# Patient Record
Sex: Male | Born: 1950 | Race: White | Hispanic: No | Marital: Married | Smoking: Former smoker
Health system: Southern US, Community
[De-identification: ages and names within clinical notes are randomized; demographics above are authoritative.]

## PROBLEM LIST (undated history)

## (undated) DIAGNOSIS — G4733 Obstructive sleep apnea (adult) (pediatric): Secondary | ICD-10-CM

## (undated) DIAGNOSIS — I2510 Atherosclerotic heart disease of native coronary artery without angina pectoris: Secondary | ICD-10-CM

## (undated) DIAGNOSIS — E8881 Metabolic syndrome: Secondary | ICD-10-CM

## (undated) DIAGNOSIS — I480 Paroxysmal atrial fibrillation: Secondary | ICD-10-CM

## (undated) DIAGNOSIS — I8390 Asymptomatic varicose veins of unspecified lower extremity: Secondary | ICD-10-CM

## (undated) DIAGNOSIS — K219 Gastro-esophageal reflux disease without esophagitis: Secondary | ICD-10-CM

## (undated) DIAGNOSIS — I255 Ischemic cardiomyopathy: Secondary | ICD-10-CM

## (undated) DIAGNOSIS — G4730 Sleep apnea, unspecified: Secondary | ICD-10-CM

## (undated) DIAGNOSIS — N400 Enlarged prostate without lower urinary tract symptoms: Secondary | ICD-10-CM

## (undated) DIAGNOSIS — H9190 Unspecified hearing loss, unspecified ear: Secondary | ICD-10-CM

## (undated) DIAGNOSIS — K449 Diaphragmatic hernia without obstruction or gangrene: Secondary | ICD-10-CM

## (undated) DIAGNOSIS — I5032 Chronic diastolic (congestive) heart failure: Secondary | ICD-10-CM

## (undated) DIAGNOSIS — K649 Unspecified hemorrhoids: Secondary | ICD-10-CM

## (undated) DIAGNOSIS — H9319 Tinnitus, unspecified ear: Secondary | ICD-10-CM

## (undated) HISTORY — DX: Unspecified hemorrhoids: K64.9

## (undated) HISTORY — DX: Diaphragmatic hernia without obstruction or gangrene: K44.9

## (undated) HISTORY — DX: Unspecified hearing loss, unspecified ear: H91.90

## (undated) HISTORY — DX: Tinnitus, unspecified ear: H93.19

## (undated) HISTORY — DX: Asymptomatic varicose veins of unspecified lower extremity: I83.90

## (undated) HISTORY — DX: Sleep apnea, unspecified: G47.30

## (undated) HISTORY — DX: Benign prostatic hyperplasia without lower urinary tract symptoms: N40.0

---

## 1999-10-11 ENCOUNTER — Ambulatory Visit: Admission: RE | Admit: 1999-10-11 | Discharge: 1999-10-11 | Payer: Self-pay | Admitting: Family Medicine

## 2004-07-21 HISTORY — PX: COLONOSCOPY: SHX174

## 2005-01-16 ENCOUNTER — Ambulatory Visit (HOSPITAL_COMMUNITY): Admission: RE | Admit: 2005-01-16 | Discharge: 2005-01-16 | Payer: Self-pay | Admitting: Gastroenterology

## 2005-01-16 LAB — HM COLONOSCOPY

## 2006-06-09 ENCOUNTER — Ambulatory Visit: Payer: Self-pay | Admitting: Family Medicine

## 2006-07-13 ENCOUNTER — Ambulatory Visit: Payer: Self-pay | Admitting: Family Medicine

## 2006-07-17 ENCOUNTER — Ambulatory Visit: Payer: Self-pay | Admitting: Family Medicine

## 2006-10-01 ENCOUNTER — Ambulatory Visit: Payer: Self-pay | Admitting: Family Medicine

## 2007-02-08 ENCOUNTER — Ambulatory Visit: Payer: Self-pay | Admitting: Family Medicine

## 2008-10-10 ENCOUNTER — Ambulatory Visit: Payer: Self-pay | Admitting: Family Medicine

## 2009-01-16 ENCOUNTER — Ambulatory Visit: Payer: Self-pay | Admitting: Family Medicine

## 2009-08-02 ENCOUNTER — Ambulatory Visit: Payer: Self-pay | Admitting: Family Medicine

## 2010-07-18 ENCOUNTER — Ambulatory Visit
Admission: RE | Admit: 2010-07-18 | Discharge: 2010-07-18 | Payer: Self-pay | Source: Home / Self Care | Attending: Family Medicine | Admitting: Family Medicine

## 2011-04-03 ENCOUNTER — Other Ambulatory Visit: Payer: Self-pay | Admitting: Family Medicine

## 2011-04-03 NOTE — Telephone Encounter (Signed)
RX CALLED IN.  PT INFORMED-LM

## 2012-02-16 ENCOUNTER — Encounter: Payer: Self-pay | Admitting: Internal Medicine

## 2012-02-20 ENCOUNTER — Encounter: Payer: Self-pay | Admitting: Family Medicine

## 2012-02-20 ENCOUNTER — Ambulatory Visit (INDEPENDENT_AMBULATORY_CARE_PROVIDER_SITE_OTHER): Payer: BC Managed Care – PPO | Admitting: Family Medicine

## 2012-02-20 VITALS — BP 130/88 | HR 76 | Ht 65.5 in | Wt 185.0 lb

## 2012-02-20 DIAGNOSIS — Z0000 Encounter for general adult medical examination without abnormal findings: Secondary | ICD-10-CM

## 2012-02-20 DIAGNOSIS — K219 Gastro-esophageal reflux disease without esophagitis: Secondary | ICD-10-CM

## 2012-02-20 DIAGNOSIS — Z87891 Personal history of nicotine dependence: Secondary | ICD-10-CM

## 2012-02-20 DIAGNOSIS — N529 Male erectile dysfunction, unspecified: Secondary | ICD-10-CM

## 2012-02-20 DIAGNOSIS — G4730 Sleep apnea, unspecified: Secondary | ICD-10-CM

## 2012-02-20 LAB — HEMOCCULT GUIAC POC 1CARD (OFFICE)

## 2012-02-20 LAB — CBC WITH DIFFERENTIAL/PLATELET
Basophils Absolute: 0 10*3/uL (ref 0.0–0.1)
Basophils Relative: 0 % (ref 0–1)
Eosinophils Absolute: 0.1 10*3/uL (ref 0.0–0.7)
Eosinophils Relative: 2 % (ref 0–5)
HCT: 45 % (ref 39.0–52.0)
Hemoglobin: 15 g/dL (ref 13.0–17.0)
Lymphocytes Relative: 32 % (ref 12–46)
Lymphs Abs: 1.6 10*3/uL (ref 0.7–4.0)
MCH: 30.5 pg (ref 26.0–34.0)
MCHC: 33.3 g/dL (ref 30.0–36.0)
MCV: 91.5 fL (ref 78.0–100.0)
Monocytes Absolute: 0.4 10*3/uL (ref 0.1–1.0)
Monocytes Relative: 8 % (ref 3–12)
Neutro Abs: 2.9 10*3/uL (ref 1.7–7.7)
Neutrophils Relative %: 58 % (ref 43–77)
Platelets: 237 10*3/uL (ref 150–400)
RBC: 4.92 MIL/uL (ref 4.22–5.81)
RDW: 14.2 % (ref 11.5–15.5)
WBC: 5 10*3/uL (ref 4.0–10.5)

## 2012-02-20 LAB — POCT URINALYSIS DIPSTICK
Bilirubin, UA: NEGATIVE
Blood, UA: NEGATIVE
Glucose, UA: NEGATIVE
Ketones, UA: NEGATIVE
Leukocytes, UA: NEGATIVE
Nitrite, UA: NEGATIVE
Protein, UA: NEGATIVE
Spec Grav, UA: 1.01
Urobilinogen, UA: NEGATIVE
pH, UA: 6

## 2012-02-20 LAB — COMPREHENSIVE METABOLIC PANEL
ALT: 20 U/L (ref 0–53)
AST: 17 U/L (ref 0–37)
Albumin: 4.2 g/dL (ref 3.5–5.2)
Alkaline Phosphatase: 59 U/L (ref 39–117)
BUN: 22 mg/dL (ref 6–23)
CO2: 26 mEq/L (ref 19–32)
Calcium: 9.7 mg/dL (ref 8.4–10.5)
Chloride: 102 mEq/L (ref 96–112)
Creat: 1.05 mg/dL (ref 0.50–1.35)
Glucose, Bld: 107 mg/dL — ABNORMAL HIGH (ref 70–99)
Potassium: 4.3 mEq/L (ref 3.5–5.3)
Sodium: 137 mEq/L (ref 135–145)
Total Bilirubin: 0.5 mg/dL (ref 0.3–1.2)
Total Protein: 6.7 g/dL (ref 6.0–8.3)

## 2012-02-20 LAB — LIPID PANEL
Cholesterol: 244 mg/dL — ABNORMAL HIGH (ref 0–200)
HDL: 39 mg/dL — ABNORMAL LOW (ref >39)
LDL Cholesterol: 171 mg/dL — ABNORMAL HIGH (ref 0–99)
Total CHOL/HDL Ratio: 6.3 Ratio
Triglycerides: 171 mg/dL — ABNORMAL HIGH (ref <150)
VLDL: 34 mg/dL (ref 0–40)

## 2012-02-20 NOTE — Patient Instructions (Signed)

## 2012-02-20 NOTE — Progress Notes (Signed)
  Subjective:    Patient ID: Henry Rogers, male    DOB: Nov 19, 1950, 61 y.o.   MRN: [id number]  HPI He is here for a complete examination. He does have reflux disease and uses medication on a daily basis. He has tried using it less often without success. He is still having difficulty with sleep and does occasionally take an OTC medication for this. He is also having difficulty with ED. He has tried Cialis in the past. 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   Review of Systems  Constitutional: Negative.   HENT: Negative.   Eyes: Negative.   Respiratory: Negative.   Cardiovascular: Negative.   Gastrointestinal: Negative.   Genitourinary: Negative.   Musculoskeletal: Negative.   Skin: Negative.   Neurological: Negative.   Hematological: Negative.   Psychiatric/Behavioral: Negative.        Objective:   Physical Exam BP 130/88  Pulse 76  Ht 5' 5.5" (1.664 m)  Wt 185 lb (83.915 kg)  BMI 30.32 kg/m2  General Appearance:    Alert, cooperative, no distress, appears stated age  Head:    Normocephalic, without obvious abnormality, atraumatic  Eyes:    PERRL, conjunctiva/corneas clear, EOM's intact, fundi    benign  Ears:    Normal TM's and external ear canals  Nose:   Nares normal, mucosa normal, no drainage or sinus   tenderness  Throat:   Lips, mucosa, and tongue normal; teeth and gums normal  Neck:   Supple, no lymphadenopathy;  thyroid:  no   enlargement/tenderness/nodules; no carotid   bruit or JVD  Back:    Spine nontender, no curvature, ROM normal, no CVA     tenderness  Lungs:     Clear to auscultation bilaterally without wheezes, rales or     ronchi; respirations unlabored  Chest Wall:    No tenderness or deformity   Heart:    Regular rate and rhythm, S1 and S2 normal, no murmur, rub   or gallop  Breast Exam:    No chest wall tenderness, masses or gynecomastia  Abdomen:     Soft, non-tender, nondistended,  normoactive bowel sounds,    no masses, no hepatosplenomegaly  Genitalia:    Normal male external genitalia without lesions.  Testicles without masses.  No inguinal hernias.  Rectal:    Normal sphincter tone, no masses or tenderness; guaiac negative stool.  Prostate smooth, no nodules, not enlarged.  Extremities:   No clubbing, cyanosis or edema  Pulses:   2+ and symmetric all extremities  Skin:   Skin color, texture, turgor normal, no rashes or lesions  Lymph nodes:   Cervical, supraclavicular, and axillary nodes normal  Neurologic:   CNII-XII intact, normal strength, sensation and gait; reflexes 2+ and symmetric throughout          Psych:   Normal mood, affect, hygiene and grooming.           Assessment & Plan:   1. GERD (gastroesophageal reflux disease)    2. Routine general medical examination at a health care facility  Hepatitis C antibody, PSA, CBC with Differential, Comprehensive metabolic panel, Lipid panel, PR ELECTROCARDIOGRAM, COMPLETE, CT Chest Wo Contrast, Hemoccult - 1 Card (office), POCT Urinalysis Dipstick  3. ED (erectile dysfunction)    4. Former smoker  CT Chest Wo Contrast  5 sleep disturbance Information on sleep hygiene given.

## 2012-02-21 LAB — PSA: PSA: 0.7 ng/mL (ref <=4.00)

## 2012-02-21 LAB — HEPATITIS C ANTIBODY: HCV Ab: NEGATIVE

## 2012-02-23 NOTE — Progress Notes (Signed)
Mailed pt copy of labs and diet info 

## 2012-10-04 ENCOUNTER — Encounter: Payer: Self-pay | Admitting: Family Medicine

## 2012-10-04 ENCOUNTER — Ambulatory Visit: Payer: BC Managed Care – PPO | Admitting: Family Medicine

## 2012-10-04 VITALS — BP 126/86 | HR 78 | Wt 190.0 lb

## 2012-10-04 DIAGNOSIS — G5600 Carpal tunnel syndrome, unspecified upper limb: Secondary | ICD-10-CM

## 2012-10-04 DIAGNOSIS — R200 Anesthesia of skin: Secondary | ICD-10-CM

## 2012-10-04 NOTE — Progress Notes (Signed)
  Subjective:    Patient ID: Henry Rogers, male    DOB: 1950-09-01, 62 y.o.   MRN: [id number]  HPI He is here for evaluation of a several month history of bilateral bilateral hand and finger tingling in fire-like sensation mainly in his thumb index and large fingers. It has gotten worse the last week and a half. He will sometimes wake up with this.he has noted no numbness tingling or weakness other than this. M is not giving him any trouble.   Review of Systems     Objective:   Physical Exam Alert and in no distress. Normal sensation and strength in his hands. Tinel's and Phalen's test was negative.       Assessment & Plan:  Numbness and tingling in hands discussed the etiology of this possibly being CTS. Recommended night splints. Explained that this did not sound like he was having any CNS type problems. If these nice plans to not work, I will order nerve conduction studies.

## 2012-10-29 ENCOUNTER — Ambulatory Visit (INDEPENDENT_AMBULATORY_CARE_PROVIDER_SITE_OTHER): Payer: BC Managed Care – PPO | Admitting: Family Medicine

## 2012-10-29 ENCOUNTER — Encounter: Payer: Self-pay | Admitting: Family Medicine

## 2012-10-29 VITALS — BP 128/88 | HR 72 | Wt 189.0 lb

## 2012-10-29 DIAGNOSIS — R201 Hypoesthesia of skin: Secondary | ICD-10-CM

## 2012-10-29 DIAGNOSIS — R209 Unspecified disturbances of skin sensation: Secondary | ICD-10-CM

## 2012-10-29 NOTE — Progress Notes (Signed)
  Subjective:    Patient ID: Henry Rogers, male    DOB: April 25, 1951, 62 y.o.   MRN: [id number]  HPI Since last being seen he has been using the wrist splints at night. He notes that the arm pain has diminished but he still is having a tingling sensation in his fingers. He has been pain around and notes that after 30 or 40 minutes he will get numbness in the hand he is using the paint   Review of Systems     Objective:   Physical Exam Alert and in no distress. Normal sensation and strength is noted. Tinel and Phalen's test negative.       Assessment & Plan:  Hypesthesia his symptoms are most consistent with CTS. I will order nerve conduction studies and followup pending results of this.

## 2012-11-02 ENCOUNTER — Ambulatory Visit: Payer: BC Managed Care – PPO | Admitting: Family Medicine

## 2012-11-09 ENCOUNTER — Ambulatory Visit (INDEPENDENT_AMBULATORY_CARE_PROVIDER_SITE_OTHER): Payer: BC Managed Care – PPO

## 2012-11-09 ENCOUNTER — Ambulatory Visit (INDEPENDENT_AMBULATORY_CARE_PROVIDER_SITE_OTHER): Payer: BC Managed Care – PPO | Admitting: Neurology

## 2012-11-09 DIAGNOSIS — Z0289 Encounter for other administrative examinations: Secondary | ICD-10-CM

## 2012-11-09 DIAGNOSIS — G5601 Carpal tunnel syndrome, right upper limb: Secondary | ICD-10-CM

## 2012-11-09 DIAGNOSIS — G5602 Carpal tunnel syndrome, left upper limb: Secondary | ICD-10-CM

## 2012-11-09 DIAGNOSIS — G5600 Carpal tunnel syndrome, unspecified upper limb: Secondary | ICD-10-CM

## 2012-11-09 NOTE — Procedures (Signed)
History: 62 years old right-handed Caucasian male, with bilateral fingers paresthesia   Nerve conduction Examination: Bilateral ulnar sensory and motor responses were normal. Bilateral median sensory response showed moderately prolonged peak latency, with normal snap amplitude.  Electromyography:  Selected needle examination was performed at right/left upper extremity muscles, and right/left cervical paraspinal muscles  Needle examination of right extensor digitorum communis, triceps, biceps, deltoid, was normal,  Right abductor pollicis brevis: Normal insertion activity, no spontaneous activity, mildly prolonged motor unit potential, with slightly decreased recruitment patterns  There was no spontaneous activity of the right cervical paraspinal muscles, right C5, 6, 7,  In conclusion: This is an abnormal study, there is no electrodiagnostic evidence of bilateral median neuropathy, consistent with moderate bilateral carpal tunnel syndromes.  There was no electrodiagnostic evidence of right cervical radiculopathy.

## 2012-11-11 NOTE — Progress Notes (Signed)
Quick Note:  Case was discussed with Dr. Terrace Arabia who will correct the dictation. Have him come in. 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

## 2012-11-12 NOTE — Progress Notes (Signed)
This the correction of previous EMG/NCS report dated April 22nd 2014:  History: 62 years old Caucasian male, with bilateral fingertips paresthesia  Nerve conduction studies: Bilateral ulnar sensory and motor responses were normal.   Bilateral median sensory response showed mild to moderately prolonged peak latency, right side was 5.0 msec, left side was 4.5 msec, (normal range <3.9 msec), with normal snap amplitude.  Bilateral median motor responses showed moderately prolonged distal latency, right side was 6.5 msec, left side was 5.7 msec, (normal was less than 4.35msec), there was also evidence of moderately prolonged distal latency, right side is worse than the left side.  Bilateral median motor responses showed normal CMAP amplitude, and nerve conduction velocity.  Electromyography:  Selected needle examination was performed at right upper extremity muscles, and right cervical paraspinal muscles  Needle examination of right extensor digitorum communis, triceps, biceps, deltoid, was normal,  Right abductor pollicis brevis: Normal insertion activity, No spontaneous activity, mildly enlarged motor unit potential, with mildly decreased recruitment patterns,  There was no spontaneous activity of the right cervical paraspinal muscles, right C5, 6, 7,  In conclusion:  This is an abnormal study, there is electrodiagnostic evidence of moderate bilateral median neuropathy across the wrist, consistent with moderate bilateral carpal tunnel syndromes, right side is mildly worse than the left side, there was no evidence of right abductor pollicis brevis atrophy, or active denervation. At current stage, conservative treatment would be recommended.  There is no electrodiagnostic evidence  of right cervical radiculopathy.

## 2012-11-15 ENCOUNTER — Institutional Professional Consult (permissible substitution): Payer: Self-pay | Admitting: Family Medicine

## 2012-11-15 ENCOUNTER — Ambulatory Visit (INDEPENDENT_AMBULATORY_CARE_PROVIDER_SITE_OTHER): Payer: BC Managed Care – PPO | Admitting: Family Medicine

## 2012-11-15 DIAGNOSIS — M25531 Pain in right wrist: Secondary | ICD-10-CM

## 2012-11-15 DIAGNOSIS — M25539 Pain in unspecified wrist: Secondary | ICD-10-CM

## 2012-11-15 DIAGNOSIS — M25532 Pain in left wrist: Secondary | ICD-10-CM

## 2012-11-15 MED ORDER — LIDOCAINE HCL 1 % IJ SOLN
1.0000 mL | Freq: Once | INTRAMUSCULAR | Status: AC
  Administered 2012-11-15: 1 mL via INTRADERMAL

## 2012-11-15 MED ORDER — TRIAMCINOLONE ACETONIDE 40 MG/ML IJ SUSP
20.0000 mg | Freq: Once | INTRAMUSCULAR | Status: AC
  Administered 2012-11-15: 20 mg via INTRAMUSCULAR

## 2012-11-15 NOTE — Progress Notes (Signed)
  Subjective:    Patient ID: CHE RACHAL, male    DOB: 09-08-1950, 62 y.o.   MRN: [id number]  HPI He is here for injection for carpal tunnel syndrome. Recent studies showed mild CTS. He has tried conservative treatment had not been successful.   Review of Systems     Objective:   Physical Exam Alert and in no distress otherwise not examined       Assessment & Plan:  Wrist pain, left - Plan: lidocaine (XYLOCAINE) 1 % (with pres) injection 1 mL, triamcinolone acetonide (KENALOG-40) injection 20 mg  Wrist pain, right - Plan: lidocaine (XYLOCAINE) 1 % (with pres) injection 1 mL, triamcinolone acetonide (KENALOG-40) injection 20 mg the palmaris longus tendon was identified. 20 mg of Kenalog and 1 cc of Xylocaine was injected to the radial side of the tendon bilaterally. He did get tingling and numbness in the median nerve distribution with injection into both carpal tunnel. He tolerated the procedure well. If continued difficulty, referral to orthopedics will be made.

## 2013-02-22 ENCOUNTER — Encounter: Payer: Self-pay | Admitting: Family Medicine

## 2013-02-22 ENCOUNTER — Ambulatory Visit (INDEPENDENT_AMBULATORY_CARE_PROVIDER_SITE_OTHER): Payer: BC Managed Care – PPO | Admitting: Family Medicine

## 2013-02-22 VITALS — BP 128/82 | HR 68 | Ht 65.0 in | Wt 188.0 lb

## 2013-02-22 DIAGNOSIS — M7710 Lateral epicondylitis, unspecified elbow: Secondary | ICD-10-CM

## 2013-02-22 DIAGNOSIS — N400 Enlarged prostate without lower urinary tract symptoms: Secondary | ICD-10-CM | POA: Insufficient documentation

## 2013-02-22 DIAGNOSIS — K219 Gastro-esophageal reflux disease without esophagitis: Secondary | ICD-10-CM

## 2013-02-22 DIAGNOSIS — L570 Actinic keratosis: Secondary | ICD-10-CM

## 2013-02-22 DIAGNOSIS — N529 Male erectile dysfunction, unspecified: Secondary | ICD-10-CM

## 2013-02-22 DIAGNOSIS — I8390 Asymptomatic varicose veins of unspecified lower extremity: Secondary | ICD-10-CM

## 2013-02-22 DIAGNOSIS — Z0000 Encounter for general adult medical examination without abnormal findings: Secondary | ICD-10-CM

## 2013-02-22 DIAGNOSIS — G5600 Carpal tunnel syndrome, unspecified upper limb: Secondary | ICD-10-CM

## 2013-02-22 DIAGNOSIS — M7711 Lateral epicondylitis, right elbow: Secondary | ICD-10-CM

## 2013-02-22 DIAGNOSIS — Z125 Encounter for screening for malignant neoplasm of prostate: Secondary | ICD-10-CM

## 2013-02-22 LAB — COMPREHENSIVE METABOLIC PANEL
ALT: 22 U/L (ref 0–53)
AST: 20 U/L (ref 0–37)
Albumin: 4.3 g/dL (ref 3.5–5.2)
Alkaline Phosphatase: 63 U/L (ref 39–117)
BUN: 19 mg/dL (ref 6–23)
CO2: 25 mEq/L (ref 19–32)
Calcium: 9.4 mg/dL (ref 8.4–10.5)
Chloride: 103 mEq/L (ref 96–112)
Creat: 1.05 mg/dL (ref 0.50–1.35)
Glucose, Bld: 105 mg/dL — ABNORMAL HIGH (ref 70–99)
Potassium: 4.2 mEq/L (ref 3.5–5.3)
Sodium: 137 mEq/L (ref 135–145)
Total Bilirubin: 0.4 mg/dL (ref 0.3–1.2)
Total Protein: 6.7 g/dL (ref 6.0–8.3)

## 2013-02-22 LAB — CBC WITH DIFFERENTIAL/PLATELET
Basophils Absolute: 0 10*3/uL (ref 0.0–0.1)
Basophils Relative: 0 % (ref 0–1)
Eosinophils Absolute: 0.1 10*3/uL (ref 0.0–0.7)
Eosinophils Relative: 3 % (ref 0–5)
HCT: 44.6 % (ref 39.0–52.0)
Hemoglobin: 15.3 g/dL (ref 13.0–17.0)
Lymphocytes Relative: 36 % (ref 12–46)
Lymphs Abs: 2 10*3/uL (ref 0.7–4.0)
MCH: 31 pg (ref 26.0–34.0)
MCHC: 34.3 g/dL (ref 30.0–36.0)
MCV: 90.5 fL (ref 78.0–100.0)
Monocytes Absolute: 0.4 10*3/uL (ref 0.1–1.0)
Monocytes Relative: 8 % (ref 3–12)
Neutro Abs: 2.9 10*3/uL (ref 1.7–7.7)
Neutrophils Relative %: 53 % (ref 43–77)
Platelets: 261 10*3/uL (ref 150–400)
RBC: 4.93 MIL/uL (ref 4.22–5.81)
RDW: 14.1 % (ref 11.5–15.5)
WBC: 5.5 10*3/uL (ref 4.0–10.5)

## 2013-02-22 LAB — POCT URINALYSIS DIPSTICK
Bilirubin, UA: NEGATIVE
Blood, UA: NEGATIVE
Glucose, UA: NEGATIVE
Ketones, UA: NEGATIVE
Leukocytes, UA: NEGATIVE
Nitrite, UA: NEGATIVE
Protein, UA: NEGATIVE
Spec Grav, UA: 1.01
Urobilinogen, UA: NEGATIVE
pH, UA: 5

## 2013-02-22 LAB — LIPID PANEL
Cholesterol: 258 mg/dL — ABNORMAL HIGH (ref 0–200)
HDL: 43 mg/dL (ref >39)
LDL Cholesterol: 183 mg/dL — ABNORMAL HIGH (ref 0–99)
Total CHOL/HDL Ratio: 6 Ratio
Triglycerides: 162 mg/dL — ABNORMAL HIGH (ref <150)
VLDL: 32 mg/dL (ref 0–40)

## 2013-02-22 NOTE — Patient Instructions (Signed)
Use Prilosec as needed for reflux

## 2013-02-22 NOTE — Progress Notes (Signed)
Subjective:    Patient ID: Henry Rogers, male    DOB: 17-Dec-1950, 62 y.o.   MRN: [id number]  HPI He is here for complete examination. He is now having difficulty with nocturia x2, urgency but no hesitancy or decreased stream. He is also noted difficulty with erectile dysfunction for well over a year. He has difficulty getting and maintaining erections with practically every sexual encounter. He notes varicose vein on the right calf that rarely causes discomfort. He also was noted some discomfort with use of the right arm at the elbow especially with his palms down and reaching backwards. He did have injections for CTS and states that he still does have some slight tingling sensation in his hands but not enough to cause any great difficulty. He has seen his dermatologist and was diagnosed with actinic keratoses. They plan to treat this more aggressively in the fall. He continues on Prilosec daily. He does note that certain foods do cause more difficulty.    Review of Systems Negative except as above     Objective:   Physical Exam BP 128/82  Pulse 68  Ht 5\' 5"  (1.651 m)  Wt 188 lb (85.276 kg)  BMI 31.28 kg/m2  General Appearance:    Alert, cooperative, no distress, appears stated age  Head:    Normocephalic, without obvious abnormality, atraumatic  Eyes:    PERRL, conjunctiva/corneas clear, EOM's intact, fundi    benign  Ears:    Normal TM's and external ear canals  Nose:   Nares normal, mucosa normal, no drainage or sinus   tenderness  Throat:   Lips, mucosa, and tongue normal; teeth and gums normal  Neck:   Supple, no lymphadenopathy;  thyroid:  no   enlargement/tenderness/nodules; no carotid   bruit or JVD  Back:    Spine nontender, no curvature, ROM normal, no CVA     tenderness  Lungs:     Clear to auscultation bilaterally without wheezes, rales or     ronchi; respirations unlabored  Chest Wall:    No tenderness or deformity   Heart:    Regular rate and rhythm, S1 and S2 normal,  no murmur, rub   or gallop  Breast Exam:    No chest wall tenderness, masses or gynecomastia  Abdomen:     Soft, non-tender, nondistended, normoactive bowel sounds,    no masses, no hepatosplenomegaly     Rectal:    Normal sphincter tone, no masses or tenderness; guaiac negative stool.  Prostate smooth, no nodules, enlarged.  Extremities:   No clubbing, cyanosis or edema; varicosities noted on both lower legs. Exam of the right elbow does show slight tenderness to palpation over the lateral epicondyle.   Pulses:   2+ and symmetric all extremities  Skin:   Skin color, texture, turgor normal, no rashes or lesions  Lymph nodes:   Cervical, supraclavicular, and axillary nodes normal  Neurologic:   CNII-XII intact, normal strength, sensation and gait; reflexes 2+ and symmetric throughout          Psych:   Normal mood, affect, hygiene and grooming.           Assessment & Plan:  Routine general medical examination at a health care facility - Plan: Urinalysis Dipstick, CBC with Differential, Comprehensive metabolic panel, Lipid panel, Hemoccult - 1 Card (office)  BPH (benign prostatic hyperplasia) - Plan: PSA  Varicose veins  Lateral epicondylitis (tennis elbow), right  CTS (carpal tunnel syndrome), unspecified laterality  Actinic keratosis  GERD (  gastroesophageal reflux disease)  Special screening for malignant neoplasm of prostate - Plan: PSA  ED (erectile dysfunction) - Plan: Testosterone I will place him on his Cialis to see if this will help with his BPH symptoms. He was given 5 mg for one month and will let me know how this works. Discussed treatment of his lateral epicondylitis with palms up and open. No particular therapy for the carpal tunnel. Discussed the treatment of actinic keratosis and what to expect. Recommend using the reflux medication on an as-needed basis. I will also check testosterone and the Cialis also help his erectile dysfunction.

## 2013-02-23 LAB — TESTOSTERONE: Testosterone: 836 ng/dL (ref 300–890)

## 2013-02-23 LAB — PSA: PSA: 0.8 ng/mL (ref <=4.00)

## 2013-02-23 NOTE — Progress Notes (Signed)
  Subjective:    Patient ID: Henry Rogers, male    DOB: 09/01/1950, 62 y.o.   MRN: [id number]  HPI    Review of Systems     Objective:   Physical Exam        Assessment & Plan:  I left him a message concerning his blood work. Encouraged him to make dietary changes to cut down on cholesterol and then recheck his lipid numbers in several months. Also encouraged him to start on his Cialis to see if it will help with his BPH/bladder symptoms.

## 2013-03-30 ENCOUNTER — Telehealth: Payer: Self-pay | Admitting: Family Medicine

## 2013-03-30 NOTE — Telephone Encounter (Signed)
Pt called and states the cialis daily is working good for the urinary and sleeping issue at night time.  It is not working for the ED issue.  Is there something else for the ED and also there is a prior authorization going through on the cialis.

## 2013-04-04 ENCOUNTER — Telehealth: Payer: Self-pay | Admitting: Family Medicine

## 2013-04-04 MED ORDER — TAMSULOSIN HCL 0.4 MG PO CAPS: 0.4000 mg | ORAL_CAPSULE | Freq: Every day | ORAL | Status: DC

## 2013-04-04 NOTE — Telephone Encounter (Signed)
His insurance would not cover Cialis for his BPH symptoms. I will place him on Flomax. This was discussed with him. He will let me know whether it works.

## 2013-05-26 ENCOUNTER — Encounter: Payer: Self-pay | Admitting: Internal Medicine

## 2013-05-26 ENCOUNTER — Other Ambulatory Visit: Payer: Self-pay

## 2013-06-06 ENCOUNTER — Encounter: Payer: Self-pay | Admitting: Family Medicine

## 2013-07-05 ENCOUNTER — Telehealth: Payer: Self-pay | Admitting: Family Medicine

## 2013-07-05 NOTE — Telephone Encounter (Signed)
PT INFORMED WORD FOR WORD AND VERBALIZED UNDERSTANDING  

## 2013-07-05 NOTE — Telephone Encounter (Signed)
Have him double the dose of the medication before we give up on it.

## 2013-07-21 DIAGNOSIS — I219 Acute myocardial infarction, unspecified: Secondary | ICD-10-CM

## 2013-07-21 HISTORY — DX: Acute myocardial infarction, unspecified: I21.9

## 2013-07-29 ENCOUNTER — Telehealth: Payer: Self-pay | Admitting: Internal Medicine

## 2013-07-29 ENCOUNTER — Telehealth: Payer: Self-pay | Admitting: Family Medicine

## 2013-07-29 MED ORDER — TAMSULOSIN HCL 0.4 MG PO CAPS: 0.8000 mg | ORAL_CAPSULE | Freq: Every day | ORAL | Status: DC

## 2013-07-29 NOTE — Telephone Encounter (Signed)
0.80 Flomax has worked for his symptoms. I will call in the prescribe

## 2013-07-29 NOTE — Telephone Encounter (Signed)
Pharmacy states that tamsulosin 0.4mg  is on backorder and they have no release date and want to know if you will change to a different med

## 2013-07-29 NOTE — Telephone Encounter (Signed)
It is on national backorder throughout all the stores and noone has it per pharmacy

## 2013-07-29 NOTE — Telephone Encounter (Signed)
Have them check with the other drug stores to see if they have any and get up from them

## 2013-08-25 ENCOUNTER — Other Ambulatory Visit: Payer: Self-pay | Admitting: Family Medicine

## 2013-08-25 MED ORDER — TAMSULOSIN HCL 0.4 MG PO CAPS: 0.8000 mg | ORAL_CAPSULE | Freq: Every day | ORAL | Status: DC

## 2014-02-02 ENCOUNTER — Encounter (HOSPITAL_COMMUNITY)
Admission: EM | Disposition: A | Discharge status: Home / Self Care | Payer: PRIVATE HEALTH INSURANCE | Attending: Cardiology

## 2014-02-02 ENCOUNTER — Inpatient Hospital Stay (HOSPITAL_COMMUNITY)
Admission: EM | Admit: 2014-02-02 | Discharge: 2014-02-05 | DRG: 248 | Disposition: A | Discharge status: Home / Self Care | Payer: Medicare Other | Source: Intra-hospital | Attending: Cardiology | Admitting: Cardiology

## 2014-02-02 ENCOUNTER — Encounter (HOSPITAL_COMMUNITY): Payer: Self-pay | Admitting: Cardiology

## 2014-02-02 DIAGNOSIS — I2584 Coronary atherosclerosis due to calcified coronary lesion: Secondary | ICD-10-CM

## 2014-02-02 DIAGNOSIS — R7309 Other abnormal glucose: Secondary | ICD-10-CM | POA: Diagnosis present

## 2014-02-02 DIAGNOSIS — N400 Enlarged prostate without lower urinary tract symptoms: Secondary | ICD-10-CM | POA: Diagnosis present

## 2014-02-02 DIAGNOSIS — H9190 Unspecified hearing loss, unspecified ear: Secondary | ICD-10-CM | POA: Diagnosis present

## 2014-02-02 DIAGNOSIS — I2510 Atherosclerotic heart disease of native coronary artery without angina pectoris: Secondary | ICD-10-CM

## 2014-02-02 DIAGNOSIS — R570 Cardiogenic shock: Secondary | ICD-10-CM

## 2014-02-02 DIAGNOSIS — Z7902 Long term (current) use of antithrombotics/antiplatelets: Secondary | ICD-10-CM | POA: Diagnosis not present

## 2014-02-02 DIAGNOSIS — K219 Gastro-esophageal reflux disease without esophagitis: Secondary | ICD-10-CM | POA: Diagnosis present

## 2014-02-02 DIAGNOSIS — G4733 Obstructive sleep apnea (adult) (pediatric): Secondary | ICD-10-CM | POA: Diagnosis present

## 2014-02-02 DIAGNOSIS — I2582 Chronic total occlusion of coronary artery: Secondary | ICD-10-CM | POA: Diagnosis present

## 2014-02-02 DIAGNOSIS — I2119 ST elevation (STEMI) myocardial infarction involving other coronary artery of inferior wall: Secondary | ICD-10-CM | POA: Diagnosis present

## 2014-02-02 DIAGNOSIS — Z87891 Personal history of nicotine dependence: Secondary | ICD-10-CM

## 2014-02-02 DIAGNOSIS — Z7982 Long term (current) use of aspirin: Secondary | ICD-10-CM

## 2014-02-02 DIAGNOSIS — I498 Other specified cardiac arrhythmias: Secondary | ICD-10-CM | POA: Diagnosis present

## 2014-02-02 DIAGNOSIS — E785 Hyperlipidemia, unspecified: Secondary | ICD-10-CM

## 2014-02-02 DIAGNOSIS — I4891 Unspecified atrial fibrillation: Secondary | ICD-10-CM | POA: Diagnosis present

## 2014-02-02 DIAGNOSIS — Z79899 Other long term (current) drug therapy: Secondary | ICD-10-CM

## 2014-02-02 DIAGNOSIS — R079 Chest pain, unspecified: Secondary | ICD-10-CM | POA: Diagnosis present

## 2014-02-02 DIAGNOSIS — I213 ST elevation (STEMI) myocardial infarction of unspecified site: Secondary | ICD-10-CM | POA: Insufficient documentation

## 2014-02-02 DIAGNOSIS — I2589 Other forms of chronic ischemic heart disease: Secondary | ICD-10-CM | POA: Diagnosis present

## 2014-02-02 DIAGNOSIS — H9113 Presbycusis, bilateral: Secondary | ICD-10-CM | POA: Diagnosis present

## 2014-02-02 DIAGNOSIS — I255 Ischemic cardiomyopathy: Secondary | ICD-10-CM | POA: Diagnosis present

## 2014-02-02 DIAGNOSIS — Z955 Presence of coronary angioplasty implant and graft: Secondary | ICD-10-CM

## 2014-02-02 DIAGNOSIS — I2111 ST elevation (STEMI) myocardial infarction involving right coronary artery: Secondary | ICD-10-CM

## 2014-02-02 DIAGNOSIS — N529 Male erectile dysfunction, unspecified: Secondary | ICD-10-CM | POA: Diagnosis present

## 2014-02-02 HISTORY — DX: Gastro-esophageal reflux disease without esophagitis: K21.9

## 2014-02-02 HISTORY — DX: Obstructive sleep apnea (adult) (pediatric): G47.33

## 2014-02-02 HISTORY — DX: Ischemic cardiomyopathy: I25.5

## 2014-02-02 HISTORY — PX: CORONARY ANGIOPLASTY WITH STENT PLACEMENT: SHX49

## 2014-02-02 HISTORY — DX: Atherosclerotic heart disease of native coronary artery without angina pectoris: I25.10

## 2014-02-02 HISTORY — PX: LEFT HEART CATHETERIZATION WITH CORONARY ANGIOGRAM: SHX5451

## 2014-02-02 LAB — COMPREHENSIVE METABOLIC PANEL
ALT: 21 U/L (ref 0–53)
AST: 21 U/L (ref 0–37)
Albumin: 3.9 g/dL (ref 3.5–5.2)
Alkaline Phosphatase: 59 U/L (ref 39–117)
Anion gap: 24 — ABNORMAL HIGH (ref 5–15)
BUN: 22 mg/dL (ref 6–23)
CO2: 16 mEq/L — ABNORMAL LOW (ref 19–32)
Calcium: 9.1 mg/dL (ref 8.4–10.5)
Chloride: 101 mEq/L (ref 96–112)
Creatinine, Ser: 1.65 mg/dL — ABNORMAL HIGH (ref 0.50–1.35)
GFR calc Af Amer: 49 mL/min — ABNORMAL LOW (ref >90)
GFR calc non Af Amer: 42 mL/min — ABNORMAL LOW (ref >90)
Glucose, Bld: 176 mg/dL — ABNORMAL HIGH (ref 70–99)
Potassium: 3.4 mEq/L — ABNORMAL LOW (ref 3.7–5.3)
Sodium: 141 mEq/L (ref 137–147)
Total Bilirubin: 0.6 mg/dL (ref 0.3–1.2)
Total Protein: 6.7 g/dL (ref 6.0–8.3)

## 2014-02-02 LAB — CBC
HCT: 40.3 % (ref 39.0–52.0)
HCT: 38.5 % — ABNORMAL LOW (ref 39.0–52.0)
Hemoglobin: 14.2 g/dL (ref 13.0–17.0)
Hemoglobin: 13 g/dL (ref 13.0–17.0)
MCH: 31.1 pg (ref 26.0–34.0)
MCH: 30.7 pg (ref 26.0–34.0)
MCHC: 35.2 g/dL (ref 30.0–36.0)
MCHC: 33.8 g/dL (ref 30.0–36.0)
MCV: 88.4 fL (ref 78.0–100.0)
MCV: 91 fL (ref 78.0–100.0)
Platelets: 227 10*3/uL (ref 150–400)
Platelets: 199 10*3/uL (ref 150–400)
RBC: 4.56 MIL/uL (ref 4.22–5.81)
RBC: 4.23 MIL/uL (ref 4.22–5.81)
RDW: 13.2 % (ref 11.5–15.5)
RDW: 13.5 % (ref 11.5–15.5)
WBC: 9.9 10*3/uL (ref 4.0–10.5)
WBC: 9.8 10*3/uL (ref 4.0–10.5)

## 2014-02-02 LAB — CREATININE, SERUM
Creatinine, Ser: 1.24 mg/dL (ref 0.50–1.35)
GFR calc Af Amer: 69 mL/min — ABNORMAL LOW (ref >90)
GFR calc non Af Amer: 59 mL/min — ABNORMAL LOW (ref >90)

## 2014-02-02 LAB — PROTIME-INR
INR: 1.03 (ref 0.00–1.49)
Prothrombin Time: 13.5 seconds (ref 11.6–15.2)

## 2014-02-02 LAB — POCT ACTIVATED CLOTTING TIME
Activated Clotting Time: 321 seconds
Activated Clotting Time: 140 seconds

## 2014-02-02 LAB — POCT I-STAT, CHEM 8
BUN: 19 mg/dL (ref 6–23)
Calcium, Ion: 1.11 mmol/L — ABNORMAL LOW (ref 1.13–1.30)
Chloride: 110 mEq/L (ref 96–112)
Creatinine, Ser: 1.7 mg/dL — ABNORMAL HIGH (ref 0.50–1.35)
Glucose, Bld: 180 mg/dL — ABNORMAL HIGH (ref 70–99)
HCT: 44 % (ref 39.0–52.0)
Hemoglobin: 15 g/dL (ref 13.0–17.0)
Potassium: 3.1 mEq/L — ABNORMAL LOW (ref 3.7–5.3)
Sodium: 139 mEq/L (ref 137–147)
TCO2: 21 mmol/L (ref 0–100)

## 2014-02-02 LAB — CK TOTAL AND CKMB (NOT AT ARMC)
CK, MB: 2.2 ng/mL (ref 0.3–4.0)
Relative Index: 1.4 (ref 0.0–2.5)
Total CK: 161 U/L (ref 7–232)

## 2014-02-02 LAB — HEMOGLOBIN A1C
Hgb A1c MFr Bld: 5.9 % — ABNORMAL HIGH (ref <5.7)
Mean Plasma Glucose: 123 mg/dL — ABNORMAL HIGH (ref <117)

## 2014-02-02 LAB — LIPID PANEL
Cholesterol: 229 mg/dL — ABNORMAL HIGH (ref 0–200)
HDL: 52 mg/dL (ref >39)
LDL Cholesterol: 153 mg/dL — ABNORMAL HIGH (ref 0–99)
Total CHOL/HDL Ratio: 4.4 RATIO
Triglycerides: 122 mg/dL (ref <150)
VLDL: 24 mg/dL (ref 0–40)

## 2014-02-02 LAB — TROPONIN I
Troponin I: <0.3 ng/mL (ref <0.30)
Troponin I: 15.9 ng/mL (ref <0.30)

## 2014-02-02 LAB — MRSA PCR SCREENING: MRSA by PCR: NEGATIVE

## 2014-02-02 LAB — APTT: aPTT: 30 seconds (ref 24–37)

## 2014-02-02 LAB — TSH: TSH: 0.973 u[IU]/mL (ref 0.350–4.500)

## 2014-02-02 SURGERY — LEFT HEART CATHETERIZATION WITH CORONARY ANGIOGRAM
Anesthesia: LOCAL

## 2014-02-02 MED ORDER — BIVALIRUDIN 250 MG IV SOLR
INTRAVENOUS | Status: AC
Start: 2014-02-02 — End: 2014-02-02
  Filled 2014-02-02: qty 250

## 2014-02-02 MED ORDER — TIROFIBAN HCL IV 5 MG/100ML
INTRAVENOUS | Status: AC
  Filled 2014-02-02: qty 100

## 2014-02-02 MED ORDER — PANTOPRAZOLE SODIUM 40 MG PO TBEC
40.0000 mg | DELAYED_RELEASE_TABLET | Freq: Every day | ORAL | Status: DC
  Administered 2014-02-03 – 2014-02-05 (×3): 40 mg via ORAL
  Filled 2014-02-02 (×3): qty 1

## 2014-02-02 MED ORDER — MIDAZOLAM HCL 2 MG/2ML IJ SOLN
INTRAMUSCULAR | Status: AC
  Filled 2014-02-02: qty 2

## 2014-02-02 MED ORDER — DEXTROSE 5 % IV SOLN: 2.0000 ug/min | INTRAVENOUS | Status: DC

## 2014-02-02 MED ORDER — DIAZEPAM 5 MG PO TABS: 5.0000 mg | ORAL_TABLET | ORAL | Status: DC | PRN

## 2014-02-02 MED ORDER — TICAGRELOR 90 MG PO TABS
ORAL_TABLET | ORAL | Status: AC
  Filled 2014-02-02: qty 2

## 2014-02-02 MED ORDER — ACETAMINOPHEN 325 MG PO TABS: 650.0000 mg | ORAL_TABLET | ORAL | Status: DC | PRN

## 2014-02-02 MED ORDER — NOREPINEPHRINE BITARTRATE 1 MG/ML IV SOLN
INTRAVENOUS | Status: AC
  Filled 2014-02-02: qty 4

## 2014-02-02 MED ORDER — SODIUM CHLORIDE 0.9 % IV SOLN: INTRAVENOUS | Status: DC

## 2014-02-02 MED ORDER — ONDANSETRON HCL 4 MG/2ML IJ SOLN: 4.0000 mg | Freq: Four times a day (QID) | INTRAMUSCULAR | Status: DC | PRN

## 2014-02-02 MED ORDER — MORPHINE SULFATE 2 MG/ML IJ SOLN: 2.0000 mg | INTRAMUSCULAR | Status: DC | PRN

## 2014-02-02 MED ORDER — ASPIRIN 81 MG PO CHEW: 81.0000 mg | CHEWABLE_TABLET | Freq: Every day | ORAL | Status: DC

## 2014-02-02 MED ORDER — TIROFIBAN HCL IV 5 MG/100ML
0.1500 ug/kg/min | INTRAVENOUS | Status: AC
  Administered 2014-02-02 – 2014-02-03 (×2): 0.15 ug/kg/min via INTRAVENOUS
  Filled 2014-02-02 (×3): qty 100

## 2014-02-02 MED ORDER — LIDOCAINE HCL (PF) 1 % IJ SOLN
INTRAMUSCULAR | Status: AC
  Filled 2014-02-02: qty 30

## 2014-02-02 MED ORDER — TIROFIBAN HCL IV 5 MG/100ML
0.1500 ug/kg/min | INTRAVENOUS | Status: DC
  Filled 2014-02-02 (×4): qty 100

## 2014-02-02 MED ORDER — ATROPINE SULFATE 0.1 MG/ML IJ SOLN
INTRAMUSCULAR | Status: AC
  Filled 2014-02-02: qty 10

## 2014-02-02 MED ORDER — HEPARIN (PORCINE) IN NACL 2-0.9 UNIT/ML-% IJ SOLN
INTRAMUSCULAR | Status: AC
  Filled 2014-02-02: qty 1000

## 2014-02-02 MED ORDER — SODIUM CHLORIDE 0.9 % IV SOLN
1.7500 mg/kg/h | INTRAVENOUS | Status: DC
  Filled 2014-02-02: qty 250

## 2014-02-02 MED ORDER — NITROGLYCERIN 0.2 MG/ML ON CALL CATH LAB
INTRAVENOUS | Status: AC
  Filled 2014-02-02: qty 1

## 2014-02-02 MED ORDER — FENTANYL CITRATE 0.05 MG/ML IJ SOLN
INTRAMUSCULAR | Status: AC
  Filled 2014-02-02: qty 2

## 2014-02-02 MED ORDER — ATORVASTATIN CALCIUM 80 MG PO TABS
80.0000 mg | ORAL_TABLET | Freq: Every day | ORAL | Status: DC
  Administered 2014-02-02 – 2014-02-04 (×3): 80 mg via ORAL
  Filled 2014-02-02 (×4): qty 1

## 2014-02-02 MED ORDER — SODIUM CHLORIDE 0.9 % IV SOLN
INTRAVENOUS | Status: AC
  Administered 2014-02-02 (×2): via INTRAVENOUS

## 2014-02-02 MED ORDER — HEPARIN SODIUM (PORCINE) 5000 UNIT/ML IJ SOLN
5000.0000 [IU] | Freq: Three times a day (TID) | INTRAMUSCULAR | Status: DC
  Administered 2014-02-03 – 2014-02-05 (×7): 5000 [IU] via SUBCUTANEOUS
  Filled 2014-02-02 (×10): qty 1

## 2014-02-02 MED ORDER — ACETAMINOPHEN 325 MG PO TABS
650.0000 mg | ORAL_TABLET | ORAL | Status: DC | PRN
  Administered 2014-02-03: 650 mg via ORAL
  Filled 2014-02-02: qty 2

## 2014-02-02 MED ORDER — NITROGLYCERIN 0.4 MG SL SUBL: 0.4000 mg | SUBLINGUAL_TABLET | SUBLINGUAL | Status: DC | PRN

## 2014-02-02 MED ORDER — TICAGRELOR 90 MG PO TABS
90.0000 mg | ORAL_TABLET | Freq: Two times a day (BID) | ORAL | Status: DC
  Administered 2014-02-02 – 2014-02-05 (×6): 90 mg via ORAL
  Filled 2014-02-02 (×7): qty 1

## 2014-02-02 MED ORDER — ASPIRIN EC 81 MG PO TBEC
81.0000 mg | DELAYED_RELEASE_TABLET | Freq: Every day | ORAL | Status: DC
  Administered 2014-02-03 – 2014-02-05 (×3): 81 mg via ORAL
  Filled 2014-02-02 (×3): qty 1

## 2014-02-02 NOTE — Progress Notes (Signed)
02/02/2014 1825  Rt femoral sheath removed and pressure held x 20 minutes.  Tol well. No complications.  Instructions given to pt and wife.  Bedrest now til 0025. Nicholos Aloisi, Linnell Fulling

## 2014-02-02 NOTE — CV Procedure (Signed)
Cardiac Catheterization Procedure Note  Name: [name] MRN: [id number] DOB: [birth year]  Procedure: Left Heart Cath, Selective Coronary Angiography, PTCA/Stent of the mid RCA.  Indication: 63 yo WM presents with an acute inferior STEMI associated with cardiogenic shock.   Diagnostic Procedure Details: The right groin was prepped, draped, and anesthetized with 1% lidocaine. Using the modified Seldinger technique, a 6 French sheath was introduced into the right femoral artery. Standard Judkins catheters were used for selective coronary angiography. Catheter exchanges were performed over a wire.  The diagnostic procedure was well-tolerated without immediate complications.  PROCEDURAL FINDINGS Hemodynamics: AO 60/30 mm Hg at the beginning of the case. 91/67 at the end of the case. Patient was initially bradycardic then went into atrial fibrillation during the procedure. LV 86/10 mm Hg by the end of the case.  Coronary angiography: Coronary dominance: right  Left mainstem: Normal.  Left anterior descending (LAD): Mild disease in the proximal vessel up to 20%.  Left circumflex (LCx): Mild ectasia in the mid vessel without obstructive disease.  Right coronary artery (RCA): The RCA is a very large and ectatic vessel. There is acute angulation in the proximal vessel and the vessel is tortuous. The RCA is occluded 100% in the mid vessel. The proximal to mid vessel has diffuse nonobstructive disease.  Left ventriculography: Not done  PCI Procedure Note:  Following the diagnostic procedure, the decision was made to proceed with PCI of the RCA.  Weight-based bivalirudin was given for anticoagulation. Brilinta 180 mg was given orally. Once a therapeutic ACT was achieved, a 6 [place]French FR4 guide catheter was inserted. IV Levophed was used for BP support. The patient received 1 liter of IV saline per bolus. A prowater coronary guidewire was used to cross the lesion.  The lesion was predilated  with a 2.5 mm balloon. With reperfusion there was extensive thrombus noted throughout the distal RCA with distal embolization into the PLOM branch. We attempted to perform thrombus extraction with a Pronto catheter but were unable to cross the proximal vessel with this device. We performed additional balloon inflations with a 2.5 mm long balloon distally and into the PLOM with restoration of flow into all but the terminal PLOM branch. Due to the extensive thrombus burden we initiated IV Aggrastat.  After our initial efforts we were able to restore TIMI 3 flow and the patient's hemodynamics improved. His chest pain also improved. We attempted at this point to stent the culprit lesion in the mid vessel. The vessel was very large at this point and we attempted to pass a 5.5 mm Herculink stent but even with a buddy wire the stent could not be passed across the proximal vessel bend. The lesion was ballooned with a 3.5 then a 4.0 mm balloon but there was a suboptimal result with poor flow through this segment. It was clear at this point that we would need better support to place a stent. We removed the wire and 6 Fr equipment. The femoral sheath was exchanged for a 7 Fr sheath. Our guide was changed to a 7 Fr LA1 guide. A PT2 moderate support wire was used to recross the lesion. We were able at this point to cross the lesion with a 5.0 x 16 mm Veriflex stent.  The stent was postdilated with a 6.0 mm noncompliant balloon.  Following PCI, there was 0% residual stenosis and TIMI-3 flow. There was significant streaming of flow distally due to the large vessel size. There was some reperfusion of  the terminal PLOM branch as well.Final angiography confirmed an excellent result. Femoral hemostasis was achieved with Manual compression.  The patient tolerated the PCI procedure well. There were no immediate procedural complications.  The patient was transferred to the post catheterization recovery area for further monitoring. We  were able to reduce IV pressor support. He remained in Afib with a controlled response. Chest pain was resolved and ST elevation was improved.   PCI Data: Vessel - RCA/Segment - mid Percent Stenosis (pre)  100% TIMI-flow 0 Stent 5.0 x 16 mm Veriflex post dilated to 6.0 mm Percent Stenosis (post) 0% TIMI-flow (post) 3  Final Conclusions:   1. 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 Cardiogenic shock due to #1. Resolved. 3. Successful stenting of the mid RCA with a BMS. Procedure was very complex due to extensive thrombosis, vessel tortuosity, and large vessel diameter.   Recommendations: Continue DAPT for at least one year. IV Aggrastat for 18 hours. Wean IV pressors as tolerated. Statin therapy. Hold beta blocker therapy until BP recovers. Will check Echo to assess LV function.  Henry Rogers [place], MDFACC  02/02/2014, 3:44 PM

## 2014-02-02 NOTE — H&P (Signed)
Henry Rogers is an 63 y.o. male.   Chief Complaint:   HPI:  The patient is a 63 yo male with a history of tobacco abuse(Quit three years ago), sleep apnea.  He was working digging out a stump since 0900 hrs this morning.  He reports becoming very tired, SOB, sweaty and felt like he was going to pass out.  He did have some CP but only rated it at a 3/10 and it did not start until after EMS arrived.  He was also having two days of diarrhea.  He went inside to rehydrate and family decided to call EMS.  No nausea, vomiting, fever, orthopnea, LEE.  No family history of CAD.    Past Medical History  Diagnosis Date  . Hemorrhoids   . Tinnitus   . Hearing loss   . HH (hiatus hernia)   . Sleep apnea     Past Surgical History  Procedure Laterality Date  . Colonoscopy  2006    Dr. Elnoria Rogers    No family history on file. Social History:  reports that he has quit smoking. He does not have any smokeless tobacco history on file. He reports that he drinks about 1.8 ounces of alcohol per week. He reports that he does not use illicit drugs.  Allergies: No Known Allergies  Medications Prior to Admission  Medication Sig Dispense Refill  . aspirin 81 MG tablet Take 81 mg by mouth daily.      . Melatonin 10 MG CAPS Take by mouth.      . Multiple Vitamin (MULTIVITAMIN) tablet Take 1 tablet by mouth daily.      [name] omeprazole (PRILOSEC) 10 MG capsule Take 10 mg by mouth daily.      . tamsulosin (FLOMAX) 0.4 MG CAPS capsule Take 2 capsules (0.8 mg total) by mouth daily.  180 capsule  3    No results found for this or any previous visit (from the past 48 hour(s)). No results found.  Review of Systems  Constitutional: Positive for malaise/fatigue and diaphoresis. Negative for fever.  HENT: Negative for congestion.   Respiratory: Positive for shortness of breath. Negative for cough.   Cardiovascular: Positive for chest pain. Negative for orthopnea, leg swelling and PND.  Gastrointestinal:  Negative for nausea and vomiting.  Musculoskeletal: Negative for myalgias.  Neurological: Positive for dizziness.    There were no vitals taken for this visit. Physical Exam  Nursing note and vitals reviewed. Constitutional: He is oriented to person, place, and time. He appears well-developed and well-nourished. No distress.  HENT:  Head: Normocephalic and atraumatic.  Eyes: EOM are normal. Pupils are equal, round, and reactive to light. No scleral icterus.  Neck: Normal range of motion.  Cardiovascular: Normal rate, regular rhythm, S1 normal and S2 normal.   No murmur heard. Pulses:      Radial pulses are 2+ on the right side, and 2+ on the left side.       Dorsalis pedis pulses are 1+ on the right side, and 1+ on the left side.  Respiratory: Breath sounds normal. He has no wheezes. He has no rales.  GI: Soft. Bowel sounds are normal. He exhibits no distension.  Musculoskeletal: He exhibits no edema.  Neurological: He is alert and oriented to person, place, and time. He exhibits normal muscle tone.  Skin: Skin is warm.  Diaphoretic  Psychiatric: He has a normal mood and affect.     Assessment/Plan Principal Problem:   STEMI (ST  elevation myocardial infarction) Active Problems:   Sleep apnea  Plan:  The patient was taken directly to the cath lab.   Check lipids, TSH, A1C.  No beta blocker at the moment due to hypotension.  Statin.  Echo.    Henry Rogers, Henry Rogers, Henry Rogers 02/02/2014, 12:51 PM   Patient seen and examined and history reviewed. Agree with above findings and plan. 63 yo WM presents with acute inferior STEMI associated with cardiogenic shock. He is diaphoretic. He is alert and awake. He is hypotensive and bradycardic. Emergent cardiac cath and PCI indicated.   Henry Rogers, MDFACC 02/02/2014 3:13 PM

## 2014-02-03 DIAGNOSIS — I059 Rheumatic mitral valve disease, unspecified: Secondary | ICD-10-CM

## 2014-02-03 LAB — BASIC METABOLIC PANEL
Anion gap: 15 (ref 5–15)
BUN: 18 mg/dL (ref 6–23)
CO2: 22 mEq/L (ref 19–32)
Calcium: 8.2 mg/dL — ABNORMAL LOW (ref 8.4–10.5)
Chloride: 101 mEq/L (ref 96–112)
Creatinine, Ser: 0.97 mg/dL (ref 0.50–1.35)
GFR calc Af Amer: >90 mL/min (ref >90)
GFR calc non Af Amer: 85 mL/min — ABNORMAL LOW (ref >90)
Glucose, Bld: 109 mg/dL — ABNORMAL HIGH (ref 70–99)
Potassium: 3.8 mEq/L (ref 3.7–5.3)
Sodium: 138 mEq/L (ref 137–147)

## 2014-02-03 LAB — CBC
HCT: 39.7 % (ref 39.0–52.0)
HCT: 35.9 % — ABNORMAL LOW (ref 39.0–52.0)
Hemoglobin: 13.3 g/dL (ref 13.0–17.0)
Hemoglobin: 12.1 g/dL — ABNORMAL LOW (ref 13.0–17.0)
MCH: 31.4 pg (ref 26.0–34.0)
MCH: 30.8 pg (ref 26.0–34.0)
MCHC: 33.5 g/dL (ref 30.0–36.0)
MCHC: 33.7 g/dL (ref 30.0–36.0)
MCV: 93.6 fL (ref 78.0–100.0)
MCV: 91.3 fL (ref 78.0–100.0)
Platelets: 209 10*3/uL (ref 150–400)
Platelets: 176 10*3/uL (ref 150–400)
RBC: 4.24 MIL/uL (ref 4.22–5.81)
RBC: 3.93 MIL/uL — ABNORMAL LOW (ref 4.22–5.81)
RDW: 14.1 % (ref 11.5–15.5)
RDW: 14 % (ref 11.5–15.5)
WBC: 8.7 10*3/uL (ref 4.0–10.5)
WBC: 8.3 10*3/uL (ref 4.0–10.5)

## 2014-02-03 LAB — TROPONIN I
Troponin I: >20 ng/mL (ref <0.30)
Troponin I: >20 ng/mL (ref <0.30)

## 2014-02-03 LAB — LIPID PANEL
Cholesterol: 190 mg/dL (ref 0–200)
HDL: 41 mg/dL (ref >39)
LDL Cholesterol: 127 mg/dL — ABNORMAL HIGH (ref 0–99)
Total CHOL/HDL Ratio: 4.6 RATIO
Triglycerides: 109 mg/dL (ref <150)
VLDL: 22 mg/dL (ref 0–40)

## 2014-02-03 LAB — HEMOGLOBIN A1C
Hgb A1c MFr Bld: 5.8 % — ABNORMAL HIGH (ref <5.7)
Mean Plasma Glucose: 120 mg/dL — ABNORMAL HIGH (ref <117)

## 2014-02-03 MED FILL — Sodium Chloride IV Soln 0.9%: INTRAVENOUS | Qty: 50 | Status: AC

## 2014-02-03 NOTE — Progress Notes (Signed)
Patient ID: Henry Rogers, male   DOB: 08-09-1950, 63 y.o.   MRN: [id number]    Subjective:  Denies SSCP, palpitations or Dyspnea   Objective:  Filed Vitals:   02/03/14 0500 02/03/14 0600 02/03/14 0700 02/03/14 0731  BP: 112/75 107/62 124/92 124/92  Pulse: 80 87 73   Temp:    98.2 F (36.8 C)  TempSrc:    Oral  Resp:    18  Height:      Weight:      SpO2: 96% 98% 98% 99%    Intake/Output from previous day:  Intake/Output Summary (Last 24 hours) at 02/03/14 0845 Last data filed at 02/03/14 0800  Gross per 24 hour  Intake 2128.06 ml  Output    350 ml  Net 1778.06 ml    Physical Exam: Affect appropriate Healthy:  appears stated age HEENT: normal Neck supple with no adenopathy JVP normal no bruits no thyromegaly Lungs clear with no wheezing and good diaphragmatic motion Heart:  S1/S2 no murmur, no rub, gallop or click PMI normal Abdomen: benighn, BS positve, no tenderness, no AAA no bruit.  No HSM or HJR Distal pulses intact with no bruits No edema Neuro non-focal Skin warm and dry No muscular weakness RFA site A no hematoma   Lab Results: Basic Metabolic Panel:  Recent Labs  27/03/50 1308 02/02/14 1838 02/03/14 0337  NA 141  --  138  K 3.4*  --  3.8  CL 101  --  101  CO2 16*  --  22  GLUCOSE 176*  --  109*  BUN 22  --  18  CREATININE 1.65* 1.24 0.97  CALCIUM 9.1  --  8.2*   Liver Function Tests:  Recent Labs  02/02/14 1308  AST 21  ALT 21  ALKPHOS 59  BILITOT 0.6  PROT 6.7  ALBUMIN 3.9   CBC:  Recent Labs  02/03/14 0058 02/03/14 0337  WBC 8.7 8.3  HGB 13.3 12.1*  HCT 39.7 35.9*  MCV 93.6 91.3  PLT 209 176   Cardiac Enzymes:  Recent Labs  02/02/14 1308 02/02/14 1838 02/03/14 0058 02/03/14 0337  CKTOTAL 161  --   --   --   CKMB 2.2  --   --   --   TROPONINI <0.30 15.90* >20.00* >20.00*   Hemoglobin A1C:  Recent Labs  02/02/14 1838  HGBA1C 5.8*   Fasting Lipid Panel:  Recent Labs  02/03/14 0337  CHOL 190    HDL 41  LDLCALC 127*  TRIG 109  CHOLHDL 4.6   Thyroid Function Tests:  Recent Labs  02/02/14 1838  TSH 0.973    Imaging: No results found.  Cardiac Studies:  ECG:   SR rate 77  Inferior Infarct no ST elevation   Telemetry:  NSR no VT   Echo: pending   Medications:   . aspirin EC  81 mg Oral Daily  . atorvastatin  80 mg Oral q1800  . heparin  5,000 Units Subcutaneous 3 times per day  . pantoprazole  40 mg Oral Daily  . ticagrelor  90 mg Oral BID     . sodium chloride 10 mL/hr at 02/03/14 0015  . norepinephrine (LEVOPHED) Adult infusion Stopped (02/02/14 1630)  . tirofiban 0.15 mcg/kg/min (02/03/14 0452)    Assessment/Plan:  IMI:  No pain hemodynamic instability resolved  Levophed d/c  Continue ASA and ticagrelor.  Consider adding beta blocker in am if BP remains stable off pressors Echo pending for EF and RV function  no murmur on exam  No LV gram done during case Chol:  On statin  Likely d/c over weekend  [facility]   Henry Rogers 02/03/2014, 8:45 AM

## 2014-02-03 NOTE — Progress Notes (Signed)
  Echocardiogram 2D Echocardiogram has been performed.  Henry Rogers 02/03/2014, 11:21 AM

## 2014-02-03 NOTE — Progress Notes (Signed)
CARDIAC REHAB PHASE I   PRE:  Rate/Rhythm: 85 SR    BP: sitting 115/89    SaO2:   MODE:  Ambulation: 700 ft   POST:  Rate/Rhythm: 104 ST    BP: sitting 140/94     SaO2: 100 RA  Tolerated well, pt wanted to go second lap however sts he was somewhat tired afterward. BP elevated. Ed completed with wife and daughter present. Very good reception with good questions. Interested in CRPII and will send referral to [place]. [id number]  Elissa Lovett [place] CES, ACSM 02/03/2014 1:13 PM

## 2014-02-03 NOTE — Progress Notes (Signed)
Utilization review completed. Cai Flott, RN, BSN. 

## 2014-02-04 DIAGNOSIS — E785 Hyperlipidemia, unspecified: Secondary | ICD-10-CM

## 2014-02-04 LAB — TROPONIN I: Troponin I: 18.58 ng/mL (ref <0.30)

## 2014-02-04 MED ORDER — LISINOPRIL 2.5 MG PO TABS
2.5000 mg | ORAL_TABLET | Freq: Every day | ORAL | Status: DC
  Administered 2014-02-04: 2.5 mg via ORAL
  Filled 2014-02-04 (×3): qty 1

## 2014-02-04 MED ORDER — METOPROLOL TARTRATE 25 MG PO TABS
25.0000 mg | ORAL_TABLET | Freq: Two times a day (BID) | ORAL | Status: DC
  Administered 2014-02-04 (×2): 25 mg via ORAL
  Filled 2014-02-04 (×4): qty 1

## 2014-02-04 NOTE — Progress Notes (Deleted)
Wasted approximately 50ml of IV versed in sink and 75ml of IV fentanyl in sink. Witnessed by Paula Trivete RN and Ladislaus Repsher, RN  

## 2014-02-04 NOTE — Progress Notes (Signed)
Subjective: No CP  No SOB   Objective: Filed Vitals:   02/04/14 0400 02/04/14 0600 02/04/14 0618 02/04/14 0628  BP: 129/87 153/102  130/95  Pulse: 81     Temp:      TempSrc:      Resp:      Height:      Weight:   179 lb 1.6 oz (81.239 kg)   SpO2:       Weight change: -8 lb 15.2 oz (-4.061 kg)  Intake/Output Summary (Last 24 hours) at 02/04/14 0848 Last data filed at 02/03/14 2200  Gross per 24 hour  Intake   1088 ml  Output   1100 ml  Net    -12 ml    General: Alert, awake, oriented x3, in no acute distress Neck:  JVP is normal Heart: Regular rate and rhythm, without murmurs, rubs, gallops.  Lungs: Clear to auscultation.  No rales or wheezes. Exemities:  No edema.   Neuro: Grossly intact, nonfocal.  Tele:  SR    Lab Results: No results found for this or any previous visit (from the past 24 hour(s)).  Studies/Results: No results found.  Medications:  Reviewed   @PROBHOSP @  1.  CAD  S/p IWMI  Cth on 7/16 LAD 20%; LCx without signif dz.; RCA large  100% mid   Patinet underwent PCI/BMS to RCA  Plan for DAPT x 1 year at least.     Echo yesterday:  LVEF 45 to 50% with akinesis of inferiro wall .   Will start lopressor 25 bid and lisinopril 2.5 today  Tx to floor  Ambulate  Probable d/c in AM   OK to go to beach  Instructed him to take it easy.  F/U appt wk after next.    2.  HL  Continue statin    LOS: 2 days   Dietrich Pates 02/04/2014, 8:48 AM

## 2014-02-04 NOTE — Progress Notes (Signed)
Walking independently with wife. Looks good. All questions answered. Ethelda Chick CES, ACSM

## 2014-02-05 ENCOUNTER — Encounter (HOSPITAL_COMMUNITY): Payer: Self-pay | Admitting: Physician Assistant

## 2014-02-05 DIAGNOSIS — I2584 Coronary atherosclerosis due to calcified coronary lesion: Secondary | ICD-10-CM

## 2014-02-05 DIAGNOSIS — I255 Ischemic cardiomyopathy: Secondary | ICD-10-CM | POA: Diagnosis present

## 2014-02-05 DIAGNOSIS — K219 Gastro-esophageal reflux disease without esophagitis: Secondary | ICD-10-CM

## 2014-02-05 DIAGNOSIS — H9190 Unspecified hearing loss, unspecified ear: Secondary | ICD-10-CM | POA: Diagnosis present

## 2014-02-05 DIAGNOSIS — I2589 Other forms of chronic ischemic heart disease: Secondary | ICD-10-CM

## 2014-02-05 DIAGNOSIS — I2510 Atherosclerotic heart disease of native coronary artery without angina pectoris: Secondary | ICD-10-CM

## 2014-02-05 DIAGNOSIS — H9113 Presbycusis, bilateral: Secondary | ICD-10-CM | POA: Diagnosis present

## 2014-02-05 MED ORDER — TICAGRELOR 90 MG PO TABS: 90.0000 mg | ORAL_TABLET | Freq: Two times a day (BID) | ORAL | Status: DC

## 2014-02-05 MED ORDER — NITROGLYCERIN 0.4 MG SL SUBL: 0.4000 mg | SUBLINGUAL_TABLET | SUBLINGUAL | Status: DC | PRN

## 2014-02-05 MED ORDER — ATORVASTATIN CALCIUM 80 MG PO TABS: 80.0000 mg | ORAL_TABLET | Freq: Every day | ORAL | Status: DC

## 2014-02-05 MED ORDER — LISINOPRIL 2.5 MG PO TABS: 2.5000 mg | ORAL_TABLET | Freq: Every day | ORAL | Status: DC

## 2014-02-05 MED ORDER — METOPROLOL TARTRATE 12.5 MG HALF TABLET
12.5000 mg | ORAL_TABLET | Freq: Two times a day (BID) | ORAL | Status: DC
  Administered 2014-02-05: 12.5 mg via ORAL
  Filled 2014-02-05 (×2): qty 1

## 2014-02-05 MED ORDER — METOPROLOL TARTRATE 25 MG PO TABS: 25.0000 mg | ORAL_TABLET | Freq: Two times a day (BID) | ORAL | Status: DC

## 2014-02-05 MED ORDER — METOPROLOL TARTRATE 12.5 MG HALF TABLET: ORAL_TABLET | ORAL | Status: DC

## 2014-02-05 NOTE — Progress Notes (Signed)
Patient Name: Henry Rogers Date of Encounter: 02/05/2014     Principal Problem:   STEMI (ST elevation myocardial infarction) Active Problems:   Sleep apnea   ST elevation myocardial infarction (STEMI) involving right coronary artery with complication    SUBJECTIVE  No chest pain. Ready to go home.   CURRENT MEDS . aspirin EC  81 mg Oral Daily  . atorvastatin  80 mg Oral q1800  . heparin  5,000 Units Subcutaneous 3 times per day  . lisinopril  2.5 mg Oral Daily  . metoprolol tartrate  25 mg Oral BID  . pantoprazole  40 mg Oral Daily  . ticagrelor  90 mg Oral BID    OBJECTIVE  Filed Vitals:   02/04/14 1355 02/04/14 2006 02/04/14 2130 02/05/14 0451  BP: 115/75 111/74 114/75 100/63  Pulse: 95 86 85 83  Temp: 97.5 F (36.4 C) 98.9 F (37.2 C)  99.2 F (37.3 C)  TempSrc: Oral Oral  Oral  Resp: 18 20  19   Height:      Weight:    177 lb 11.2 oz (80.604 kg)  SpO2: 98% 96%  96%    Intake/Output Summary (Last 24 hours) at 02/05/14 0726 Last data filed at 02/04/14 1630  Gross per 24 hour  Intake    600 ml  Output      0 ml  Net    600 ml   Filed Weights   02/03/14 0353 02/04/14 0618 02/05/14 0451  Weight: 177 lb 11.1 oz (80.6 kg) 179 lb 1.6 oz (81.239 kg) 177 lb 11.2 oz (80.604 kg)    PHYSICAL EXAM  General: Pleasant, NAD. Neuro: Alert and oriented X 3. Moves all extremities spontaneously. Psych: Normal affect. HEENT:  Normal  Neck: Supple without bruits or JVD. Lungs:  Resp regular and unlabored, CTA. Heart: RRR no s3, s4, or murmurs. Femoral cath site with no echymossis  Abdomen: Soft, non-tender, non-distended, BS + x 4.  Extremities: No clubbing, cyanosis or edema. DP/PT/Radials 2+ and equal bilaterally.  Accessory Clinical Findings  CBC  Recent Labs  02/03/14 0058 02/03/14 0337  WBC 8.7 8.3  HGB 13.3 12.1*  HCT 39.7 35.9*  MCV 93.6 91.3  PLT 209 176   Basic Metabolic Panel  Recent Labs  02/02/14 1308 02/02/14 1838 02/03/14 0337    NA 141  --  138  K 3.4*  --  3.8  CL 101  --  101  CO2 16*  --  22  GLUCOSE 176*  --  109*  BUN 22  --  18  CREATININE 1.65* 1.24 0.97  CALCIUM 9.1  --  8.2*   Liver Function Tests  Recent Labs  02/02/14 1308  AST 21  ALT 21  ALKPHOS 59  BILITOT 0.6  PROT 6.7  ALBUMIN 3.9    Cardiac Enzymes  Recent Labs  02/02/14 1308  02/03/14 0058 02/03/14 0337 02/04/14 0955  CKTOTAL 161  --   --   --   --   CKMB 2.2  --   --   --   --   TROPONINI <0.30  < > >20.00* >20.00* 18.58*  < > = values in this interval not displayed.  Hemoglobin A1C  Recent Labs  02/02/14 1838  HGBA1C 5.8*   Fasting Lipid Panel  Recent Labs  02/03/14 0337  CHOL 190  HDL 41  LDLCALC 127*  TRIG 109  CHOLHDL 4.6   Thyroid Function Tests  Recent Labs  02/02/14 1838  TSH 0.973  TELE  Sinus tach HR ~100  Radiology/Studies  Cardiac Catheterization Procedure Note  Name: Henry Rogers  MRN: [id number]  DOB: Dec 01, 1950  Procedure: Left Heart Cath, Selective Coronary Angiography, PTCA/Stent of the mid RCA.  Indication: 63 yo WM presents with an acute inferior STEMI associated with cardiogenic shock.  Diagnostic Procedure Details: The right groin was prepped, draped, and anesthetized with 1% lidocaine. Using the modified Seldinger technique, a 6 French sheath was introduced into the right femoral artery. Standard Judkins catheters were used for selective coronary angiography. Catheter exchanges were performed over a wire. The diagnostic procedure was well-tolerated without immediate complications.  PROCEDURAL FINDINGS  Hemodynamics:  AO 60/30 mm Hg at the beginning of the case. 91/67 at the end of the case. Patient was initially bradycardic then went into atrial fibrillation during the procedure.  LV 86/10 mm Hg by the end of the case.  Coronary angiography:  Coronary dominance: right  Left mainstem: Normal.  Left anterior descending (LAD): Mild disease in the proximal vessel up to  20%.  Left circumflex (LCx): Mild ectasia in the mid vessel without obstructive disease.  Right coronary artery (RCA): The RCA is a very large and ectatic vessel. There is acute angulation in the proximal vessel and the vessel is tortuous. The RCA is occluded 100% in the mid vessel. The proximal to mid vessel has diffuse nonobstructive disease.  Left ventriculography: Not done  PCI Procedure Note: Following the diagnostic procedure, the decision was made to proceed with PCI of the RCA. Weight-based bivalirudin was given for anticoagulation. Brilinta 180 mg was given orally. Once a therapeutic ACT was achieved, a 6 [place] FR4 guide catheter was inserted. IV Levophed was used for BP support. The patient received 1 liter of IV saline per bolus. A prowater coronary guidewire was used to cross the lesion. The lesion was predilated with a 2.5 mm balloon. With reperfusion there was extensive thrombus noted throughout the distal RCA with distal embolization into the PLOM branch. We attempted to perform thrombus extraction with a Pronto catheter but were unable to cross the proximal vessel with this device. We performed additional balloon inflations with a 2.5 mm long balloon distally and into the PLOM with restoration of flow into all but the terminal PLOM branch. Due to the extensive thrombus burden we initiated IV Aggrastat. After our initial efforts we were able to restore TIMI 3 flow and the patient's hemodynamics improved. His chest pain also improved. We attempted at this point to stent the culprit lesion in the mid vessel. The vessel was very large at this point and we attempted to pass a 5.5 mm Herculink stent but even with a buddy wire the stent could not be passed across the proximal vessel bend. The lesion was ballooned with a 3.5 then a 4.0 mm balloon but there was a suboptimal result with poor flow through this segment. It was clear at this point that we would need better support to place a stent. We  removed the wire and 6 Fr equipment. The femoral sheath was exchanged for a 7 Fr sheath. Our guide was changed to a 7 Fr LA1 guide. A PT2 moderate support wire was used to recross the lesion. We were able at this point to cross the lesion with a 5.0 x 16 mm Veriflex stent. The stent was postdilated with a 6.0 mm noncompliant balloon. Following PCI, there was 0% residual stenosis and TIMI-3 flow. There was significant streaming of flow distally due to the large vessel  size. There was some reperfusion of the terminal PLOM branch as well.Final angiography confirmed an excellent result. Femoral hemostasis was achieved with Manual compression. The patient tolerated the PCI procedure well. There were no immediate procedural complications. The patient was transferred to the post catheterization recovery area for further monitoring. We were able to reduce IV pressor support. He remained in Afib with a controlled response. Chest pain was resolved and ST elevation was improved.  PCI Data:  Vessel - RCA/Segment - mid  Percent Stenosis (pre) 100%  TIMI-flow 0  Stent 5.0 x 16 mm Veriflex post dilated to 6.0 mm  Percent Stenosis (post) 0%  TIMI-flow (post) 3  Final Conclusions:  1. 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 Cardiogenic shock due to #1. Resolved.  3. Successful stenting of the mid RCA with a BMS. Procedure was very complex due to extensive thrombosis, vessel tortuosity, and large vessel diameter.  Recommendations: Continue DAPT for at least one year. IV Aggrastat for 18 hours. Wean IV pressors as tolerated. Statin therapy. Hold beta blocker therapy until BP recovers. Will check Echo to assess LV function.   2D ECHO: 02/03/2014 LV EF: 45% - 50% Study Conclusions - Left ventricle: The cavity size was normal. Systolic function was mildly reduced. The estimated ejection fraction was in the range of 45% to 50%. There is akinesis of the entireinferior myocardium. - Mitral  valve: Calcified annulus. There was mild regurgitation.   ASSESSMENT AND PLAN  Henry Rogers is a 63 y.o. male with a history of BPH, prior tobacco abuse, OSA and GERD who presented to [facility] on 02/02/14 with chest pain and found to have an acute inferior STEMI c/b cardiogenic shock and was taken for emergency cardiac catheterization.   Inferior STEMI s/p LHC on 02/02/14 which revealed single vessel occlusive CAD with 100% occlusion of the mid RCA. He underwent successful stenting of the mid RCA with a BMS. Procedure was very complex due to extensive thrombosis, vessel tortuosity, and large vessel diameter. The patient was found to be in cardiogenic shock and required pressors.  -- Peak troponin  >20, now starting to trend downwards -- Continue DAPT w/ ASA/Brilinta for at least 1 years. Continue statin and BB.  Ischemic cardiomyopathy- ECHO with EF 45-50% and akinesis of the entire inferior myocardium. Mild MR. -- Started on lopressor 25mg  BID and lisinopril 2.5 yesterday. BP soft but stable. He takes Flomax as an outpatient for BPH. Will discontinue lisinopril and reduce lopressor at discharge due to soft pressures and his need to take BPH medicine  Pre-diabetes- HgA1C 8.5. Diet and exercise.   HLD- TC 229, TG 122, HDL 52, LDL 153. Continue statin  OSA- mild per sleep study. Does not require CPAP  BPH- patient takes Flomax 0.8mg  daily. This will be resumed on discharge and his lisinopril discontinued and lopressor reduced due to soft pressures. Lisinopril can be re added as an outpatient at his follow up appointment.   Dispo- He has planned vacation to the beach next week. Dr. Tenny Craw has given her okay to go with close follow up in the clinic the following week. He will follow with Dr. [place]  Signed, STERN, [facility] PA-C  Pager [phone]

## 2014-02-05 NOTE — Discharge Summary (Signed)
Discharge Summary   Patient ID: Henry Rogers MRN: [id number], DOB/AGE: 65-Sep-1952 65 y.o. Admit date: 02/02/2014 D/C date:     02/05/2014  Primary Cardiologist: Dr. SwazilandJordan  Principal Problem:   Acute ST elevation myocardial infarction (STEMI) involving right coronary artery Active Problems:   Sleep apnea   ED (erectile dysfunction)   GERD (gastroesophageal reflux disease)   BPH (benign prostatic hyperplasia)   CAD (coronary artery disease)   Ischemic cardiomyopathy   Hearing loss   Discharge Diagnosis: Acute inferior STEMI c/b cardiogenic shock s/p BMS to mRCA  HPI: Henry Rogers is a 63 y.o. male with a history of BPH, prior tobacco abuse, OSA, HLD and GERD who [facility] with chest pain and found to have an acute inferior STEMI c/b cardiogenic shock and was taken for emergency cardiac catheterization.   Hospital Course  Inferior STEMI s/p LHC on 02/02/14 which revealed single vessel occlusive CAD with 100% occlusion of the mid RCA. He underwent successful stenting of the mid RCA with a BMS. Procedure was very complex due to extensive thrombosis, vessel tortuosity, and large vessel diameter. The patient was found to be in cardiogenic shock and required pressors.  -- Peak troponin >20, now starting to trend downwards  -- Continue DAPT w/ ASA/Brilinta for at least 1 years. Continue statin and BB.   Ischemic cardiomyopathy- ECHO with EF 45-50% and akinesis of the entire inferior myocardium. Mild MR.  -- Started on lopressor 25mg  BID and lisinopril 2.5 yesterday. BP soft but stable. He takes Flomax as an outpatient for BPH. Will discontinue lisinopril and reduce lopressor at discharge due to soft pressures and his need to take BPH medicine  -- Lisinopril can be re added as an outpatient at his follow up appointment if BP tolerates.   Pre-diabetes- HgA1C 8.5. Diet and exercise.   HLD- TC 229, TG 122, HDL 52, LDL 153. Continue statin   OSA- mild per sleep  study. Does not require CPAP   BPH- patient takes Flomax 0.8mg  daily. He has not be on this during his admission. This will be resumed on discharge and his lisinopril discontinued and lopressor reduced due to soft pressures. Lisinopril can be re added as an outpatient at his follow up appointment if BP tolerates.   Dispo- He has planned vacation to the beach next week. Dr. Tenny Crawoss has given her okay to go with close follow up in the clinic the following week. He will follow with Dr. SwazilandJordan   The patient has had an uncomplicated hospital course and is recovering well. The femoral catheter site is stable. He has been seen by Dr. Mayford Knifeurner today and deemed ready for discharge home. All follow-up appointments have been scheduled (VM left with office on weekends).  A 30 day free supply of Brilinta was provided for the patient.  Discharge medications include ASA/ Brilinta, Lipitor 80mg  qd, lopressor 12.5mg  BID, SL NTG and Flomax 0.8mg  qd.   Discharge Vitals: Blood pressure 100/63, pulse 83, temperature 99.2 F (37.3 C), temperature source Oral, resp. rate 19, height 5\' 6"  (1.676 m), weight 177 lb 11.2 oz (80.604 kg), SpO2 96.00%.  Labs: Lab Results  Component Value Date   WBC 8.3 02/03/2014   HGB 12.1* 02/03/2014   HCT 35.9* 02/03/2014   MCV 91.3 02/03/2014   PLT 176 02/03/2014    Recent Labs Lab 02/02/14 1308  02/03/14 0337  NA 141  --  138  K 3.4*  --  3.8  CL  101  --  101  CO2 16*  --  22  BUN 22  --  18  CREATININE 1.65*  < > 0.97  CALCIUM 9.1  --  8.2*  PROT 6.7  --   --   BILITOT 0.6  --   --   ALKPHOS 59  --   --   ALT 21  --   --   AST 21  --   --   GLUCOSE 176*  --  109*  < > = values in this interval not displayed.  Recent Labs  02/02/14 1308 02/02/14 1838 02/03/14 0058 02/03/14 0337 02/04/14 0955  CKTOTAL 161  --   --   --   --   CKMB 2.2  --   --   --   --   TROPONINI <0.30 15.90* >20.00* >20.00* 18.58*   Lab Results  Component Value Date   CHOL 190 02/03/2014   HDL 41  02/03/2014   LDLCALC 127* 02/03/2014   TRIG 109 02/03/2014     Diagnostic Studies/Procedures   Cardiac Catheterization Procedure Note  Name: Henry Rogers  MRN: [id number]  DOB: 02-14-1951  Procedure: Left Heart Cath, Selective Coronary Angiography, PTCA/Stent of the mid RCA.  Indication: 63 yo WM presents with an acute inferior STEMI associated with cardiogenic shock.  Diagnostic Procedure Details: The right groin was prepped, draped, and anesthetized with 1% lidocaine. Using the modified Seldinger technique, a 6 French sheath was introduced into the right femoral artery. Standard Judkins catheters were used for selective coronary angiography. Catheter exchanges were performed over a wire. The diagnostic procedure was well-tolerated without immediate complications.  PROCEDURAL FINDINGS  Hemodynamics:  AO 60/30 mm Hg at the beginning of the case. 91/67 at the end of the case. Patient was initially bradycardic then went into atrial fibrillation during the procedure.  LV 86/10 mm Hg by the end of the case.  Coronary angiography:  Coronary dominance: right  Left mainstem: Normal.  Left anterior descending (LAD): Mild disease in the proximal vessel up to 20%.  Left circumflex (LCx): Mild ectasia in the mid vessel without obstructive disease.  Right coronary artery (RCA): The RCA is a very large and ectatic vessel. There is acute angulation in the proximal vessel and the vessel is tortuous. The RCA is occluded 100% in the mid vessel. The proximal to mid vessel has diffuse nonobstructive disease.  Left ventriculography: Not done  PCI Procedure Note: Following the diagnostic procedure, the decision was made to proceed with PCI of the RCA. Weight-based bivalirudin was given for anticoagulation. Brilinta 180 mg was given orally. Once a therapeutic ACT was achieved, a 6 [place] FR4 guide catheter was inserted. IV Levophed was used for BP support. The patient received 1 liter of IV saline per bolus. A  prowater coronary guidewire was used to cross the lesion. The lesion was predilated with a 2.5 mm balloon. With reperfusion there was extensive thrombus noted throughout the distal RCA with distal embolization into the PLOM branch. We attempted to perform thrombus extraction with a Pronto catheter but were unable to cross the proximal vessel with this device. We performed additional balloon inflations with a 2.5 mm long balloon distally and into the PLOM with restoration of flow into all but the terminal PLOM branch. Due to the extensive thrombus burden we initiated IV Aggrastat. After our initial efforts we were able to restore TIMI 3 flow and the patient's hemodynamics improved. His chest pain also improved. We attempted at this point  to stent the culprit lesion in the mid vessel. The vessel was very large at this point and we attempted to pass a 5.5 mm Herculink stent but even with a buddy wire the stent could not be passed across the proximal vessel bend. The lesion was ballooned with a 3.5 then a 4.0 mm balloon but there was a suboptimal result with poor flow through this segment. It was clear at this point that we would need better support to place a stent. We removed the wire and 6 Fr equipment. The femoral sheath was exchanged for a 7 Fr sheath. Our guide was changed to a 7 Fr LA1 guide. A PT2 moderate support wire was used to recross the lesion. We were able at this point to cross the lesion with a 5.0 x 16 mm Veriflex stent. The stent was postdilated with a 6.0 mm noncompliant balloon. Following PCI, there was 0% residual stenosis and TIMI-3 flow. There was significant streaming of flow distally due to the large vessel size. There was some reperfusion of the terminal PLOM branch as well.Final angiography confirmed an excellent result. Femoral hemostasis was achieved with Manual compression. The patient tolerated the PCI procedure well. There were no immediate procedural complications. The patient was  transferred to the post catheterization recovery area for further monitoring. We were able to reduce IV pressor support. He remained in Afib with a controlled response. Chest pain was resolved and ST elevation was improved.  PCI Data:  Vessel - RCA/Segment - mid  Percent Stenosis (pre) 100%  TIMI-flow 0  Stent 5.0 x 16 mm Veriflex post dilated to 6.0 mm  Percent Stenosis (post) 0%  TIMI-flow (post) 3  Final Conclusions:  1. 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 Cardiogenic shock due to #1. Resolved.  3. Successful stenting of the mid RCA with a BMS. Procedure was very complex due to extensive thrombosis, vessel tortuosity, and large vessel diameter.  Recommendations: Continue DAPT for at least one year. IV Aggrastat for 18 hours. Wean IV pressors as tolerated. Statin therapy. Hold beta blocker therapy until BP recovers. Will check Echo to assess LV function.    2D ECHO: 02/03/2014 LV EF: 45% - 50% Study Conclusions - Left ventricle: The cavity size was normal. Systolic function was mildly reduced. The estimated ejection fraction was in the range of 45% to 50%. There is akinesis of the entireinferior myocardium. - Mitral valve: Calcified annulus. There was mild regurgitation.    Discharge Medications     Medication List         aspirin 81 MG tablet  Take 81 mg by mouth daily.     atorvastatin 80 MG tablet  Commonly known as:  LIPITOR  Take 1 tablet (80 mg total) by mouth daily at 6 PM.     JUICE PLUS FIBRE PO  Take 1 tablet by mouth. Vegetable blend caps     JUICE PLUS FIBRE PO  Take 1 tablet by mouth 2 (two) times daily. Fruit blend     loperamide 2 MG tablet  Commonly known as:  IMODIUM A-D  Take 2 mg by mouth as needed for diarrhea or loose stools.     Melatonin 10 MG Caps  Take 10 mg by mouth at bedtime as needed (for sleep).     metoprolol tartrate 12.5 mg Tabs tablet  Commonly known as:  LOPRESSOR  Take 12.5 mg (half tablet)  twice daily     multivitamin tablet  Take  1 tablet by mouth daily.     nitroGLYCERIN 0.4 MG SL tablet  Commonly known as:  NITROSTAT  Place 1 tablet (0.4 mg total) under the tongue every 5 (five) minutes x 3 doses as needed for chest pain.     omeprazole 10 MG capsule  Commonly known as:  PRILOSEC  Take 10 mg by mouth daily.     tamsulosin 0.4 MG Caps capsule  Commonly known as:  FLOMAX  Take 2 capsules (0.8 mg total) by mouth daily.     ticagrelor 90 MG Tabs tablet  Commonly known as:  BRILINTA  Take 1 tablet (90 mg total) by mouth 2 (two) times daily.        Disposition   The patient will be discharged in stable condition to home. Discharge Instructions   Amb Referral to Cardiac Rehabilitation    Complete by:  As directed           Follow-up Information   Follow up with Peter Swaziland, MD On 02/15/2014. (The office will call you to make an appointment for 1-2 weeks when you get back from the beach. If you do not hear from the office by Tuesday, please contact them )    Specialty:  Cardiology   Contact information:   [address] [place] [address] [phone]         Duration of Discharge Encounter: Greater than 30 minutes including physician and PA time.  SignedVenetia Maxon, KATHRYN PA-C 02/05/2014, 8:42 AM

## 2014-02-05 NOTE — Progress Notes (Signed)
Patient seen and examined and agree with note as outlined by Deborha Payment, PA-C.  He has not chest pain and is ambulating without difficulty. His BP is a little soft so will hold Lisinopril and consider restarting as an outpt.  He has been up about 3 times nightly due to his BPH so need to restart his flomax.  Since Bp is borderline low will decrease BB as we are restarting Flomax which may lower BP some.  He is ok to go to the beach and will followup with Cardiology when he gets home.

## 2014-02-05 NOTE — Progress Notes (Signed)
Reviewed discharge instructions with patient and wife including medications. Questions answered. DC  Home with instructions and prescriptions Henry Rogers

## 2014-02-05 NOTE — Discharge Instructions (Signed)

## 2014-02-06 ENCOUNTER — Telehealth: Payer: Self-pay | Admitting: Cardiology

## 2014-02-06 NOTE — Discharge Summary (Signed)
Agree with discharge summary as outlined by Kathryn Stern PA-C 

## 2014-02-07 NOTE — Telephone Encounter (Signed)
Closed encounter °

## 2014-02-08 ENCOUNTER — Telehealth: Payer: Self-pay | Admitting: Cardiology

## 2014-02-10 NOTE — Telephone Encounter (Signed)
Closed encounter °

## 2014-02-22 ENCOUNTER — Encounter: Payer: Self-pay | Admitting: Cardiology

## 2014-02-22 ENCOUNTER — Ambulatory Visit (INDEPENDENT_AMBULATORY_CARE_PROVIDER_SITE_OTHER): Payer: PRIVATE HEALTH INSURANCE | Admitting: Cardiology

## 2014-02-22 VITALS — BP 116/82 | HR 71 | Ht 66.0 in | Wt 183.5 lb

## 2014-02-22 DIAGNOSIS — E785 Hyperlipidemia, unspecified: Secondary | ICD-10-CM

## 2014-02-22 DIAGNOSIS — I255 Ischemic cardiomyopathy: Secondary | ICD-10-CM

## 2014-02-22 DIAGNOSIS — E8881 Metabolic syndrome: Secondary | ICD-10-CM

## 2014-02-22 DIAGNOSIS — I2589 Other forms of chronic ischemic heart disease: Secondary | ICD-10-CM

## 2014-02-22 DIAGNOSIS — I2119 ST elevation (STEMI) myocardial infarction involving other coronary artery of inferior wall: Secondary | ICD-10-CM

## 2014-02-22 DIAGNOSIS — I2109 ST elevation (STEMI) myocardial infarction involving other coronary artery of anterior wall: Secondary | ICD-10-CM

## 2014-02-22 DIAGNOSIS — I2111 ST elevation (STEMI) myocardial infarction involving right coronary artery: Secondary | ICD-10-CM

## 2014-02-22 DIAGNOSIS — G4730 Sleep apnea, unspecified: Secondary | ICD-10-CM

## 2014-02-22 DIAGNOSIS — I2510 Atherosclerotic heart disease of native coronary artery without angina pectoris: Secondary | ICD-10-CM

## 2014-02-22 MED ORDER — RAMIPRIL 1.25 MG PO CAPS: 1.2500 mg | ORAL_CAPSULE | Freq: Every day | ORAL | Status: DC

## 2014-02-22 NOTE — Assessment & Plan Note (Signed)
ST elevation MI of the inferior wall 02/02/2014 undergoing emergent cardiac catheterization and bare metal stents to the RCA.  Patient also with cardiogenic shock on arrival to the hospital.

## 2014-02-22 NOTE — Assessment & Plan Note (Signed)
Hemoglobin A1c was 8.5 during hospitalization patient has been watching his diet more closely will follow up with PCP tomorrow for possible treatment of his diabetes.

## 2014-02-22 NOTE — Progress Notes (Signed)
02/22/2014   PCP: Carollee HerterLALONDE,JOHN CHARLES, MD   Chief Complaint  Patient presents with  . Follow-up    Post [facility] for MI, Cath was done stents placed, pt denied chest pain and SOB    Primary Cardiologist:Dr. P. SwazilandJordan   HPI:  63 y.o. male with a history of BPH, prior tobacco abuse, OSA, HLD and GERD who [facility] with chest pain and found to have an acute inferior STEMI with cardiogenic shock and was taken for emergency cardiac catheterization. He presented with no chest pain but very tired short of breath and diaphoretic he felt as if he would pass out on arrival.  He did develop mild chest pain after EMS arrived graded at 3/10.    With emerging cath the RCA was large and ectatic vessel, with acute angulation in the proximal vessel and the vessel was tortuous. The RCA was totally occluded 100% in the mid vessel.  Other disease was only 20% LAD and mild ectasia in the left circumflex.  It was large clot burden as well patient had PCI with a veriflex stent bare metal.  Echo revealed EF 45-50% with akinesis of the entire inferior myocardium. Mitral valve with a calcified annulus and mild regurgitation.  Patient recovered fairly quickly, there were issues with hypotension once ACE inhibitor was started blood pressure dropped again and ACE was stopped.  Today he has no complaints he feels quite well he is anxious and that he did not have acute chest pain to begin with.  He and his wife were walking for exercise, denies chest pain shortness of breath no racing heart rate neuro complaints at all.  He is driving and her back [place] earlier this week. He was placed on Lipitor 80 mg and will need lipid panel in 4 weeks.    No Known Allergies  Current Outpatient Prescriptions  Medication Sig Dispense Refill  . aspirin 81 MG tablet Take 81 mg by mouth daily.      [name]. atorvastatin (LIPITOR) 80 MG tablet Take 1 tablet (80 mg total) by mouth daily at 6 PM.  30  tablet  11  . Melatonin 10 MG CAPS Take 10 mg by mouth at bedtime as needed (for sleep).       . metoprolol tartrate (LOPRESSOR) 12.5 mg TABS tablet Take 12.5 mg (half tablet) twice daily  60 tablet  11  . Multiple Vitamin (MULTIVITAMIN) tablet Take 1 tablet by mouth daily.      . nitroGLYCERIN (NITROSTAT) 0.4 MG SL tablet Place 1 tablet (0.4 mg total) under the tongue every 5 (five) minutes x 3 doses as needed for chest pain.  25 tablet  12  . Nutritional Supplements (JUICE PLUS FIBRE PO) Take 1 tablet by mouth. Vegetable blend caps      . Nutritional Supplements (JUICE PLUS FIBRE PO) Take 1 tablet by mouth 2 (two) times daily. Fruit blend      . omeprazole (PRILOSEC) 10 MG capsule Take 10 mg by mouth daily.      . tamsulosin (FLOMAX) 0.4 MG CAPS capsule Take 2 capsules (0.8 mg total) by mouth daily.  180 capsule  3  . ticagrelor (BRILINTA) 90 MG TABS tablet Take 1 tablet (90 mg total) by mouth 2 (two) times daily.  60 tablet  11  . ramipril (ALTACE) 1.25 MG capsule Take 1 capsule (1.25 mg total) by mouth daily.  30 capsule  6   No current  facility-administered medications for this visit.    Past Medical History  Diagnosis Date  . Hemorrhoids   . Tinnitus   . Hearing loss   . HH (hiatus hernia)   . GERD (gastroesophageal reflux disease)   . OSA (obstructive sleep apnea)     a. mild not requiring CPAP at this time  . CAD (coronary artery disease)     a. inf STEMI s/p BMS to mRCA (02/03/14)  . Ischemic cardiomyopathy     a. ECHO 01/2014 EF 45-50% and akinesis of the entire inferior myocardium. Mild MR.   [name] STEMI (ST elevation myocardial infarction) 02/02/14    BMS to RCA  . Varicosities of leg     Past Surgical History  Procedure Laterality Date  . Colonoscopy  2006    Dr. Elnoria Howard  . Coronary angioplasty with stent placement  02/02/14    BMS to RCA    UYQ:IHKVQQV:ZD colds or fevers, minimal weight changes Skin:no rashes or ulcers HEENT:no blurred vision, no congestion CV:see  HPI PUL:see HPI GI:no diarrhea constipation or melena, some indigestion-he is on Prilosec  GU:no hematuria, no dysuria MS:no joint pain, no claudication Neuro:no syncope, no lightheadedness Endo:+ diabetes- hemoglobin A1c was 8.5 in the hospital-he is to see primary care tomorrow, no thyroid disease  Wt Readings from Last 3 Encounters:  02/22/14 183 lb 8 oz (83.235 kg)  02/05/14 177 lb 11.2 oz (80.604 kg)  02/05/14 177 lb 11.2 oz (80.604 kg)    PHYSICAL EXAM BP 116/82  Pulse 71  Ht 5\' 6"  (1.676 m)  Wt 183 lb 8 oz (83.235 kg)  BMI 29.63 kg/m2 General:Pleasant affect, NAD Skin:Warm and dry, brisk capillary refill HEENT:normocephalic, sclera clear, mucus membranes moist Neck:supple, no JVD, no bruits  Heart:S1S2 RRR without murmur, gallup, rub or click Lungs:clear without rales, rhonchi, or wheezes GLO:VFIE, non tender, + BS, do not palpate liver spleen or masses Ext:no lower ext edema, 2+ pedal pulses, 2+ radial pulses Neuro:alert and oriented, MAE, follows commands, + facial symmetry  EKG:SR inf. MI deep t wave inversions in II, III, AVF, no change since discharge from hospital.  ASSESSMENT AND PLAN STEMI (ST elevation myocardial infarction) ST elevation MI of the inferior wall 02/02/2014 undergoing emergent cardiac catheterization and bare metal stents to the RCA.  Patient also with cardiogenic shock on arrival to the hospital.    CAD (coronary artery disease) Continue dual antiplatelet agents for one year. We will like him to attend cardiac rehabilitation I have sent in a request today.  Hyperlipidemia LDL goal <70 Lipid Panel     Component Value Date/Time   CHOL 190 02/03/2014 0337   TRIG 109 02/03/2014 0337   HDL 41 02/03/2014 0337   CHOLHDL 4.6 02/03/2014 0337   VLDL 22 02/03/2014 0337   LDLCALC 127* 02/03/2014 0337   On Lipitor 80 mg daily, recheck lipid and hepatic panel in 4 weeks.  Sleep apnea stable  Ischemic cardiomyopathy EF 45-50% we'll attempt to resume  ACE inhibitor Altace at 1.25 mg daily and slowly increase  Metabolic syndrome Hemoglobin A1c was 8.5 during hospitalization patient has been watching his diet more closely will follow up with PCP tomorrow for possible treatment of his diabetes.    patient will need to follow with Dr. Swaziland in 4 weeks

## 2014-02-22 NOTE — Assessment & Plan Note (Signed)
Continue dual antiplatelet agents for one year. We will like him to attend cardiac rehabilitation I have sent in a request today.

## 2014-02-22 NOTE — Assessment & Plan Note (Signed)
EF 45-50% we'll attempt to resume ACE inhibitor Altace at 1.25 mg daily and slowly increase

## 2014-02-22 NOTE — Patient Instructions (Addendum)
Your physician recommends that you schedule a follow-up appointment in: 4-5 weeks with Dr Thomasene Lot  Your physician has recommended you make the following change in your medication: Start Altace 1.25 mg daily  You have been referred to Cardiac Rehab at [facility]

## 2014-02-22 NOTE — Assessment & Plan Note (Signed)
Lipid Panel     Component Value Date/Time   CHOL 190 02/03/2014 0337   TRIG 109 02/03/2014 0337   HDL 41 02/03/2014 0337   CHOLHDL 4.6 02/03/2014 0337   VLDL 22 02/03/2014 0337   LDLCALC 127* 02/03/2014 0337   On Lipitor 80 mg daily, recheck lipid and hepatic panel in 4 weeks.

## 2014-02-22 NOTE — Assessment & Plan Note (Signed)
stable °

## 2014-02-24 ENCOUNTER — Ambulatory Visit (INDEPENDENT_AMBULATORY_CARE_PROVIDER_SITE_OTHER): Payer: Medicare Other | Admitting: Family Medicine

## 2014-02-24 ENCOUNTER — Encounter: Payer: Self-pay | Admitting: Family Medicine

## 2014-02-24 DIAGNOSIS — E8881 Metabolic syndrome: Secondary | ICD-10-CM

## 2014-02-24 DIAGNOSIS — I255 Ischemic cardiomyopathy: Secondary | ICD-10-CM

## 2014-02-24 DIAGNOSIS — I2589 Other forms of chronic ischemic heart disease: Secondary | ICD-10-CM

## 2014-02-24 DIAGNOSIS — I2111 ST elevation (STEMI) myocardial infarction involving right coronary artery: Secondary | ICD-10-CM

## 2014-02-24 DIAGNOSIS — I2119 ST elevation (STEMI) myocardial infarction involving other coronary artery of inferior wall: Secondary | ICD-10-CM

## 2014-02-24 DIAGNOSIS — N529 Male erectile dysfunction, unspecified: Secondary | ICD-10-CM

## 2014-02-24 DIAGNOSIS — K219 Gastro-esophageal reflux disease without esophagitis: Secondary | ICD-10-CM

## 2014-02-24 DIAGNOSIS — Z0000 Encounter for general adult medical examination without abnormal findings: Secondary | ICD-10-CM

## 2014-02-24 DIAGNOSIS — E785 Hyperlipidemia, unspecified: Secondary | ICD-10-CM

## 2014-02-24 NOTE — Progress Notes (Signed)
   Subjective:    Patient ID: Henry Rogers, male    DOB: [birth year], 63 y.o.   MRN: [id number]  HPI He is here for complete examination. He recently had a STEMI with stent. He has had a followup visit with cardiology. His medications were reviewed. He presently is on Lipitor 80 mg as well as other medications listed in the chart. He does complain of reflux symptoms but seems to have them under fairly good control. 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 He also complains of right upper trapezius intermittent pain usually responds to stretching. He also complains of itching in his back that he notes went away when he was in [place]. I did inject these in the past however his symptoms have recurred. He also has questions concerning resuming normal sexual activity. Recent blood work did show hemoglobin A1c of 5.8.  Review of Systems  All other systems reviewed and are negative.      Objective:   Physical Exam Alert and in no distress. Cardiac exam shows regular rhythm without murmurs or gallops. Lungs clear to auscultation. Trigger point noted in the right trapezius that was easily treated with pressure. Exam of his back shows no lesions.      Assessment & Plan:  ST elevation myocardial infarction involving right coronary artery  Ischemic cardiomyopathy  Hyperlipidemia LDL goal <70  Gastroesophageal reflux disease without esophagitis  Erectile dysfunction, unspecified erectile dysfunction type  Metabolic syndrome  over 45 minutes spent mainly in counseling with him. We talked extensively about his heart attack and stent as well as followup. He does note that his stamina is not what it was before the MI. He was probably the best shape he had been in in quite some time since he recently returned from [place] and he did a lot of hiking. I discussed sexual activity and the use of Cialis. He suggested that he go slow and that particular area until he feels more comfortable. He  will start cardiac rehabilitation in the near future. Did recommend he cut back on Lipitor to 40 mg. Discussed his hemoglobin A1c of 5.8 in regard to risk for diabetes as well as its implications with cardiac disease. Essentially explained that the same therapy applies to both which is diet and exercise. Discussed the possibility of depression also playing a role in an MI patient. Encouraged him to verbalize his concerns. Discussed trigger point therapy especially in regard to stretching. No particular therapy for his back. He will need followup concerning his lipids and the next couple of months.

## 2014-03-01 ENCOUNTER — Telehealth: Payer: Self-pay | Admitting: Cardiology

## 2014-03-01 NOTE — Telephone Encounter (Signed)
Returned a call to patient. He is concerned about his atorvastatin dose. States that Dr. Susann Givens told him that 80 mg seems like too much of a dose. Recommended to patient to decrease to 40 mg. Patient did not want to change the dose without checking with Dr. Swaziland  And getting his opinion on this. Informed patient that I will defer this to Dr. Swaziland. Either myself or Elnita Maxwell will call him back once recommendations were made.

## 2014-03-01 NOTE — Telephone Encounter (Signed)
Patient has questions regarding his Lipitor dosage.

## 2014-03-02 ENCOUNTER — Encounter (HOSPITAL_COMMUNITY)
Admission: RE | Admit: 2014-03-02 | Discharge: 2014-03-02 | Disposition: A | Discharge status: Home / Self Care | Payer: Medicare Other | Source: Ambulatory Visit | Attending: Cardiology | Admitting: Cardiology

## 2014-03-02 DIAGNOSIS — I252 Old myocardial infarction: Secondary | ICD-10-CM | POA: Insufficient documentation

## 2014-03-02 DIAGNOSIS — I2510 Atherosclerotic heart disease of native coronary artery without angina pectoris: Secondary | ICD-10-CM | POA: Insufficient documentation

## 2014-03-02 DIAGNOSIS — Z9861 Coronary angioplasty status: Secondary | ICD-10-CM | POA: Insufficient documentation

## 2014-03-02 DIAGNOSIS — Z5189 Encounter for other specified aftercare: Secondary | ICD-10-CM | POA: Insufficient documentation

## 2014-03-02 NOTE — Progress Notes (Signed)
Cardiac Rehab Medication Review by a Pharmacist  Does the patient  feel that his/her medications are working for him/her?  yes  Has the patient been experiencing any side effects to the medications prescribed?  no  Does the patient measure his/her own blood pressure or blood glucose at home?  no   Does the patient have any problems obtaining medications due to transportation or finances?   no  Understanding of regimen: good Understanding of indications: good Potential of compliance: good   Pharmacist comments: Pt reports taking medications as prescribed. Reports no problems with medications. Recently started ramipril this past week. BP today 126/62.   Armandina Stammer 03/02/2014 8:46 AM

## 2014-03-05 NOTE — Telephone Encounter (Signed)
With recent MI I think high dose statin is appropriate. Continue lipitor 80 mg unless he is experiencing side effects.  Peter Swaziland MD, [facility]

## 2014-03-06 ENCOUNTER — Encounter (HOSPITAL_COMMUNITY)
Admission: RE | Admit: 2014-03-06 | Discharge: 2014-03-06 | Disposition: A | Discharge status: Home / Self Care | Payer: Medicare Other | Source: Ambulatory Visit | Attending: Cardiology | Admitting: Cardiology

## 2014-03-06 DIAGNOSIS — I252 Old myocardial infarction: Secondary | ICD-10-CM | POA: Diagnosis not present

## 2014-03-06 DIAGNOSIS — Z5189 Encounter for other specified aftercare: Secondary | ICD-10-CM | POA: Diagnosis present

## 2014-03-06 DIAGNOSIS — Z9861 Coronary angioplasty status: Secondary | ICD-10-CM | POA: Diagnosis not present

## 2014-03-06 DIAGNOSIS — I2510 Atherosclerotic heart disease of native coronary artery without angina pectoris: Secondary | ICD-10-CM | POA: Diagnosis not present

## 2014-03-06 NOTE — Telephone Encounter (Signed)
Received call back from patient Dr.Jordan advised needs to continue Lipitor 80 mgs unless having side effects.Patient stated no side effects he will continue Lipitor 80 mg.

## 2014-03-06 NOTE — Progress Notes (Signed)
Pt started cardiac rehab today.  Pt tolerated light exercise without difficulty. Vital signs stable. Telemetry rhythm Sinus with T wave inversion this has been previously documented. Henry Rogers's long and short term goals are to decrease his weight and increase his stamina. Will continue to monitor the patient throughout  the program.

## 2014-03-06 NOTE — Telephone Encounter (Signed)
Returned call to patient no answer.LMTC. 

## 2014-03-08 ENCOUNTER — Encounter (HOSPITAL_COMMUNITY)
Admission: RE | Admit: 2014-03-08 | Discharge: 2014-03-08 | Disposition: A | Discharge status: Home / Self Care | Payer: Medicare Other | Source: Ambulatory Visit | Attending: Cardiology | Admitting: Cardiology

## 2014-03-08 DIAGNOSIS — Z5189 Encounter for other specified aftercare: Secondary | ICD-10-CM | POA: Diagnosis not present

## 2014-03-10 ENCOUNTER — Encounter (HOSPITAL_COMMUNITY)
Admission: RE | Admit: 2014-03-10 | Discharge: 2014-03-10 | Disposition: A | Discharge status: Home / Self Care | Payer: Medicare Other | Source: Ambulatory Visit | Attending: Cardiology | Admitting: Cardiology

## 2014-03-10 DIAGNOSIS — Z5189 Encounter for other specified aftercare: Secondary | ICD-10-CM | POA: Diagnosis not present

## 2014-03-13 ENCOUNTER — Encounter (HOSPITAL_COMMUNITY)
Admission: RE | Admit: 2014-03-13 | Discharge: 2014-03-13 | Disposition: A | Discharge status: Home / Self Care | Payer: Medicare Other | Source: Ambulatory Visit | Attending: Cardiology | Admitting: Cardiology

## 2014-03-13 DIAGNOSIS — Z5189 Encounter for other specified aftercare: Secondary | ICD-10-CM | POA: Diagnosis not present

## 2014-03-15 ENCOUNTER — Encounter (HOSPITAL_COMMUNITY)
Admission: RE | Admit: 2014-03-15 | Discharge: 2014-03-15 | Disposition: A | Discharge status: Home / Self Care | Payer: Medicare Other | Source: Ambulatory Visit | Attending: Cardiology | Admitting: Cardiology

## 2014-03-15 DIAGNOSIS — Z5189 Encounter for other specified aftercare: Secondary | ICD-10-CM | POA: Diagnosis not present

## 2014-03-17 ENCOUNTER — Encounter (HOSPITAL_COMMUNITY)
Admission: RE | Admit: 2014-03-17 | Discharge: 2014-03-17 | Disposition: A | Discharge status: Home / Self Care | Payer: Medicare Other | Source: Ambulatory Visit | Attending: Cardiology | Admitting: Cardiology

## 2014-03-17 DIAGNOSIS — Z5189 Encounter for other specified aftercare: Secondary | ICD-10-CM | POA: Diagnosis not present

## 2014-03-20 ENCOUNTER — Encounter (HOSPITAL_COMMUNITY)
Admission: RE | Admit: 2014-03-20 | Discharge: 2014-03-20 | Disposition: A | Discharge status: Home / Self Care | Payer: Medicare Other | Source: Ambulatory Visit | Attending: Cardiology | Admitting: Cardiology

## 2014-03-20 DIAGNOSIS — Z5189 Encounter for other specified aftercare: Secondary | ICD-10-CM | POA: Diagnosis not present

## 2014-03-20 NOTE — Progress Notes (Signed)
Reviewed home exercise with pt today.  Pt plans to walk and use recumbent bike at home for exercise.  Reviewed THR, pulse, RPE, sign and symptoms, NTG use, and when to call 911 or MD.  Pt voiced understanding. Raeden Schippers, MA, ACSM RCEP  

## 2014-03-22 ENCOUNTER — Encounter (HOSPITAL_COMMUNITY)
Admission: RE | Admit: 2014-03-22 | Discharge: 2014-03-22 | Disposition: A | Discharge status: Home / Self Care | Payer: Medicare Other | Source: Ambulatory Visit | Attending: Cardiology | Admitting: Cardiology

## 2014-03-22 DIAGNOSIS — Z5189 Encounter for other specified aftercare: Secondary | ICD-10-CM | POA: Diagnosis present

## 2014-03-22 DIAGNOSIS — I252 Old myocardial infarction: Secondary | ICD-10-CM | POA: Diagnosis not present

## 2014-03-22 DIAGNOSIS — Z9861 Coronary angioplasty status: Secondary | ICD-10-CM | POA: Insufficient documentation

## 2014-03-22 DIAGNOSIS — I2510 Atherosclerotic heart disease of native coronary artery without angina pectoris: Secondary | ICD-10-CM | POA: Insufficient documentation

## 2014-03-24 ENCOUNTER — Encounter (HOSPITAL_COMMUNITY)
Admission: RE | Admit: 2014-03-24 | Discharge: 2014-03-24 | Disposition: A | Discharge status: Home / Self Care | Payer: Medicare Other | Source: Ambulatory Visit | Attending: Cardiology | Admitting: Cardiology

## 2014-03-24 DIAGNOSIS — Z5189 Encounter for other specified aftercare: Secondary | ICD-10-CM | POA: Diagnosis not present

## 2014-03-29 ENCOUNTER — Encounter (HOSPITAL_COMMUNITY)
Admission: RE | Admit: 2014-03-29 | Discharge: 2014-03-29 | Disposition: A | Discharge status: Home / Self Care | Payer: Medicare Other | Source: Ambulatory Visit | Attending: Cardiology | Admitting: Cardiology

## 2014-03-29 ENCOUNTER — Telehealth: Payer: Self-pay | Admitting: Family Medicine

## 2014-03-29 ENCOUNTER — Other Ambulatory Visit: Payer: Self-pay

## 2014-03-29 DIAGNOSIS — Z5189 Encounter for other specified aftercare: Secondary | ICD-10-CM | POA: Diagnosis not present

## 2014-03-29 DIAGNOSIS — I83893 Varicose veins of bilateral lower extremities with other complications: Secondary | ICD-10-CM

## 2014-03-29 NOTE — Telephone Encounter (Signed)
Send him to one of the vein clinics

## 2014-03-29 NOTE — Progress Notes (Signed)
Lenn Cal 63 y.o. male Nutrition Note Spoke with pt.  Nutrition Survey reviewed with pt. Pt is following Step 1 of the Therapeutic Lifestyle Changes diet. Pt wants to lose wt. Wt loss tips briefly reviewed. Pt expressed understanding of the information reviewed. Pt aware of nutrition education classes offered.  Nutrition Diagnosis   Food-and nutrition-related knowledge deficit related to lack of exposure to information as related to diagnosis of: ? CVD ? Pre-DM (A1c 5.8)     Overweight related to excessive energy intake as evidenced by a BMI of 29.6  Nutrition Intervention   Benefits of adopting Therapeutic Lifestyle Changes discussed when Medficts reviewed.   Pt to attend the Portion Distortion class   Pt to attend the  ? Nutrition I class                     ? Nutrition II class - met 03/28/14   Continue client-centered nutrition education by RD, as part of interdisciplinary care.  Goal(s)   Pt to identify and limit food sources of saturated fat, trans fat, and cholesterol   Pt to identify food quantities necessary to achieve: ? wt loss to a goal wt of 160-178 lb (73-81.2 kg) at graduation from cardiac rehab.   Monitor and Evaluate progress toward nutrition goal with team.  Derek Mound, M.Ed, RD, LDN, CDE 03/29/2014 11:30 AM

## 2014-03-29 NOTE — Telephone Encounter (Signed)
I HAVE PUT ORDER IN SYSTEM TO [facility]

## 2014-03-31 ENCOUNTER — Encounter (HOSPITAL_COMMUNITY)
Admission: RE | Admit: 2014-03-31 | Discharge: 2014-03-31 | Disposition: A | Discharge status: Home / Self Care | Payer: Medicare Other | Source: Ambulatory Visit | Attending: Cardiology | Admitting: Cardiology

## 2014-03-31 DIAGNOSIS — Z5189 Encounter for other specified aftercare: Secondary | ICD-10-CM | POA: Diagnosis not present

## 2014-04-03 ENCOUNTER — Encounter (HOSPITAL_COMMUNITY)
Admission: RE | Admit: 2014-04-03 | Discharge: 2014-04-03 | Disposition: A | Discharge status: Home / Self Care | Payer: Medicare Other | Source: Ambulatory Visit | Attending: Cardiology | Admitting: Cardiology

## 2014-04-03 DIAGNOSIS — Z5189 Encounter for other specified aftercare: Secondary | ICD-10-CM | POA: Diagnosis not present

## 2014-04-04 ENCOUNTER — Other Ambulatory Visit: Payer: Self-pay | Admitting: *Deleted

## 2014-04-04 DIAGNOSIS — I83893 Varicose veins of bilateral lower extremities with other complications: Secondary | ICD-10-CM

## 2014-04-05 ENCOUNTER — Encounter (HOSPITAL_COMMUNITY)
Admission: RE | Admit: 2014-04-05 | Discharge: 2014-04-05 | Disposition: A | Discharge status: Home / Self Care | Payer: Medicare Other | Source: Ambulatory Visit | Attending: Cardiology | Admitting: Cardiology

## 2014-04-05 DIAGNOSIS — Z5189 Encounter for other specified aftercare: Secondary | ICD-10-CM | POA: Diagnosis not present

## 2014-04-07 ENCOUNTER — Encounter (HOSPITAL_COMMUNITY)
Admission: RE | Admit: 2014-04-07 | Discharge: 2014-04-07 | Disposition: A | Discharge status: Home / Self Care | Payer: Medicare Other | Source: Ambulatory Visit | Attending: Cardiology | Admitting: Cardiology

## 2014-04-07 DIAGNOSIS — Z5189 Encounter for other specified aftercare: Secondary | ICD-10-CM | POA: Diagnosis not present

## 2014-04-10 ENCOUNTER — Encounter (HOSPITAL_COMMUNITY)
Admission: RE | Admit: 2014-04-10 | Discharge: 2014-04-10 | Disposition: A | Discharge status: Home / Self Care | Payer: Medicare Other | Source: Ambulatory Visit | Attending: Cardiology | Admitting: Cardiology

## 2014-04-10 DIAGNOSIS — Z5189 Encounter for other specified aftercare: Secondary | ICD-10-CM | POA: Diagnosis not present

## 2014-04-10 NOTE — Progress Notes (Addendum)
Intermittent exertional blood pressure elevations noted at cardiac rehab. Resting blood pressure have been within normal limits. Will fax exercise flow sheets to Dr. Elvis Coil office for review. Henry Rogers is doing well with exercise.

## 2014-04-12 ENCOUNTER — Other Ambulatory Visit (INDEPENDENT_AMBULATORY_CARE_PROVIDER_SITE_OTHER): Payer: Medicare Other

## 2014-04-12 ENCOUNTER — Encounter (HOSPITAL_COMMUNITY)
Admission: RE | Admit: 2014-04-12 | Discharge: 2014-04-12 | Disposition: A | Discharge status: Home / Self Care | Payer: Medicare Other | Source: Ambulatory Visit | Attending: Cardiology | Admitting: Cardiology

## 2014-04-12 DIAGNOSIS — Z5189 Encounter for other specified aftercare: Secondary | ICD-10-CM | POA: Diagnosis not present

## 2014-04-12 DIAGNOSIS — Z23 Encounter for immunization: Secondary | ICD-10-CM

## 2014-04-14 ENCOUNTER — Encounter (HOSPITAL_COMMUNITY)
Admission: RE | Admit: 2014-04-14 | Discharge: 2014-04-14 | Disposition: A | Discharge status: Home / Self Care | Payer: Medicare Other | Source: Ambulatory Visit | Attending: Cardiology | Admitting: Cardiology

## 2014-04-14 DIAGNOSIS — Z5189 Encounter for other specified aftercare: Secondary | ICD-10-CM | POA: Diagnosis not present

## 2014-04-17 ENCOUNTER — Encounter (HOSPITAL_COMMUNITY)
Admission: RE | Admit: 2014-04-17 | Discharge: 2014-04-17 | Disposition: A | Discharge status: Home / Self Care | Payer: Medicare Other | Source: Ambulatory Visit | Attending: Cardiology | Admitting: Cardiology

## 2014-04-17 DIAGNOSIS — Z5189 Encounter for other specified aftercare: Secondary | ICD-10-CM | POA: Diagnosis not present

## 2014-04-18 ENCOUNTER — Encounter: Payer: Self-pay | Admitting: Cardiology

## 2014-04-18 ENCOUNTER — Ambulatory Visit (INDEPENDENT_AMBULATORY_CARE_PROVIDER_SITE_OTHER): Payer: Medicare Other | Admitting: Cardiology

## 2014-04-18 VITALS — BP 90/60 | HR 80 | Ht 66.0 in | Wt 180.2 lb

## 2014-04-18 DIAGNOSIS — I2589 Other forms of chronic ischemic heart disease: Secondary | ICD-10-CM

## 2014-04-18 DIAGNOSIS — I255 Ischemic cardiomyopathy: Secondary | ICD-10-CM

## 2014-04-18 DIAGNOSIS — I2510 Atherosclerotic heart disease of native coronary artery without angina pectoris: Secondary | ICD-10-CM

## 2014-04-18 DIAGNOSIS — E785 Hyperlipidemia, unspecified: Secondary | ICD-10-CM

## 2014-04-18 NOTE — Patient Instructions (Addendum)
Stop ramipril  Continue your other therapy  We will schedule you for fasting lab work and an Echocardiogram  I will see you in 6 months.

## 2014-04-18 NOTE — Progress Notes (Signed)
Henry Rogers Date of Birth: [birth year] Medical Record #[id number]  History of Present Illness: [name] is seen for follow up today. He is s/p inferior STEMI on 02/02/14. The Mid RCA was occluded with large thrombus load. He had cardiogenic shock. The RCA was stented with a 5.0x16 mm Veriflex stent. EF was 45-50%. His BP remained low in the hospital. Other vessels had no significant disease. He has done very well since then. He is an active participant in Cardiac Rehab. He is following a very healthy diet. He denies any chest pain or SOB. He did feel lightheaded one day last week. At Rehab he was noted to have hypotension with BP 84/54 this week.     Medication List       This list is accurate as of: 04/18/14  1:51 PM.  Always use your most recent med list.               aspirin 81 MG tablet  Take 81 mg by mouth daily.     atorvastatin 80 MG tablet  Commonly known as:  LIPITOR  Take 1 tablet (80 mg total) by mouth daily at 6 PM.     ibuprofen 200 MG tablet  Commonly known as:  ADVIL,MOTRIN  Take 200 mg by mouth every 6 (six) hours as needed for mild pain. Takes as needed for muscle aches     JUICE PLUS FIBRE PO  Take 1 tablet by mouth. Vegetable blend caps     JUICE PLUS FIBRE PO  Take 1 tablet by mouth 2 (two) times daily. Fruit blend     Melatonin 10 MG Caps  Take 10 mg by mouth at bedtime as needed (for sleep).     metoprolol tartrate 12.5 mg Tabs tablet  Commonly known as:  LOPRESSOR  Take 12.5 mg (half tablet) twice daily     multivitamin tablet  Take 1 tablet by mouth daily.     nitroGLYCERIN 0.4 MG SL tablet  Commonly known as:  NITROSTAT  Place 1 tablet (0.4 mg total) under the tongue every 5 (five) minutes x 3 doses as needed for chest pain.     omeprazole 10 MG capsule  Commonly known as:  PRILOSEC  Take 10 mg by mouth daily.     ranitidine 75 MG tablet  Commonly known as:  ZANTAC  Take 75 mg by mouth at bedtime as needed for heartburn.     tamsulosin 0.4 MG Caps capsule  Commonly known as:  FLOMAX  Take 2 capsules (0.8 mg total) by mouth daily.     ticagrelor 90 MG Tabs tablet  Commonly known as:  BRILINTA  Take 1 tablet (90 mg total) by mouth 2 (two) times daily.        No Known Allergies  Past Medical History  Diagnosis Date  . Hemorrhoids   . Tinnitus   . Hearing loss   . HH (hiatus hernia)   . GERD (gastroesophageal reflux disease)   . OSA (obstructive sleep apnea)     a. mild not requiring CPAP at this time  . CAD (coronary artery disease)     a. inf STEMI s/p BMS to mRCA (02/03/14)  . Ischemic cardiomyopathy     a. ECHO 01/2014 EF 45-50% and akinesis of the entire inferior myocardium. Mild MR.   [name]. STEMI (ST elevation myocardial infarction) 02/02/14    BMS to RCA  . Varicosities of leg     Past Surgical History  Procedure Laterality Date  .  Colonoscopy  2006    Dr. Elnoria Howard  . Coronary angioplasty with stent placement  02/02/14    BMS to RCA    History   Social History  . Marital Status: Married    Spouse Name: N/A    Number of Children: N/A  . Years of Education: N/A   Social History Main Topics  . Smoking status: Former Games developer  . Smokeless tobacco: None  . Alcohol Use: 1.8 oz/week    3 Glasses of wine per week  . Drug Use: No  . Sexual Activity: Yes   Other Topics Concern  . None   Social History Narrative  . None    Family History  Problem Relation Age of Onset  . Hypertension Mother   . Cancer Maternal Grandmother     Review of Systems: As noted in HPI.  All other systems were reviewed and are negative.  Physical Exam: BP 90/60  Pulse 80  Ht 5\' 6"  (1.676 m)  Wt 180 lb 3.2 oz (81.738 kg)  BMI 29.10 kg/m2 Filed Weights   04/18/14 1205  Weight: 180 lb 3.2 oz (81.738 kg)  GENERAL:  Well appearing WM in NAD HEENT:  PERRL, EOMI, sclera are clear. Oropharynx is clear. NECK:  No jugular venous distention, carotid upstroke brisk and symmetric, no bruits, no thyromegaly or  adenopathy LUNGS:  Clear to auscultation bilaterally CHEST:  Unremarkable HEART:  RRR,  PMI not displaced or sustained,S1 and S2 within normal limits, no S3, no S4: no clicks, no rubs, no murmurs ABD:  Soft, nontender. BS +, no masses or bruits. No hepatomegaly, no splenomegaly EXT:  2 + pulses throughout, no edema, no cyanosis no clubbing SKIN:  Warm and dry.  No rashes NEURO:  Alert and oriented x 3. Cranial nerves II through XII intact. PSYCH:  Cognitively intact    LABORATORY DATA:   Assessment / Plan: 1. CAD s/p inferior STEMI 02/02/14 with cardiogenic shock. S/p BMS of mid RCA. Continue DAPT for one year. Continue statin and metoprolol. Will repeat Echo to reassess EF post infarct. Follow up in 6 months.  2. Ischemic cardiomyopathy with EF 45-50%. Will DC ramipril due to hypotension. Continue metoprolol  3. Hyperlipidemia. Will schedule fasting lab work. Continue high dose statin. Continue dietary modification.

## 2014-04-19 ENCOUNTER — Encounter (HOSPITAL_COMMUNITY)
Admission: RE | Admit: 2014-04-19 | Discharge: 2014-04-19 | Disposition: A | Discharge status: Home / Self Care | Payer: Medicare Other | Source: Ambulatory Visit | Attending: Cardiology | Admitting: Cardiology

## 2014-04-19 ENCOUNTER — Ambulatory Visit (HOSPITAL_COMMUNITY)
Admission: RE | Admit: 2014-04-19 | Discharge: 2014-04-19 | Disposition: A | Discharge status: Home / Self Care | Payer: Medicare Other | Source: Ambulatory Visit | Attending: Cardiovascular Disease | Admitting: Cardiovascular Disease

## 2014-04-19 DIAGNOSIS — I255 Ischemic cardiomyopathy: Secondary | ICD-10-CM

## 2014-04-19 DIAGNOSIS — E785 Hyperlipidemia, unspecified: Secondary | ICD-10-CM

## 2014-04-19 DIAGNOSIS — I2510 Atherosclerotic heart disease of native coronary artery without angina pectoris: Secondary | ICD-10-CM | POA: Diagnosis present

## 2014-04-19 DIAGNOSIS — I059 Rheumatic mitral valve disease, unspecified: Secondary | ICD-10-CM

## 2014-04-19 LAB — BASIC METABOLIC PANEL
BUN: 16 mg/dL (ref 6–23)
CO2: 22 mEq/L (ref 19–32)
Calcium: 9.2 mg/dL (ref 8.4–10.5)
Chloride: 104 mEq/L (ref 96–112)
Creat: 1.12 mg/dL (ref 0.50–1.35)
Glucose, Bld: 96 mg/dL (ref 70–99)
Potassium: 4.4 mEq/L (ref 3.5–5.3)
Sodium: 139 mEq/L (ref 135–145)

## 2014-04-19 LAB — LIPID PANEL
Cholesterol: 110 mg/dL (ref 0–200)
HDL: 41 mg/dL (ref >39)
LDL Cholesterol: 40 mg/dL (ref 0–99)
Total CHOL/HDL Ratio: 2.7 Ratio
Triglycerides: 144 mg/dL (ref <150)
VLDL: 29 mg/dL (ref 0–40)

## 2014-04-19 LAB — HEPATIC FUNCTION PANEL
ALT: 28 U/L (ref 0–53)
AST: 25 U/L (ref 0–37)
Albumin: 4.1 g/dL (ref 3.5–5.2)
Alkaline Phosphatase: 66 U/L (ref 39–117)
Bilirubin, Direct: 0.1 mg/dL (ref 0.0–0.3)
Indirect Bilirubin: 0.4 mg/dL (ref 0.2–1.2)
Total Bilirubin: 0.5 mg/dL (ref 0.2–1.2)
Total Protein: 6.4 g/dL (ref 6.0–8.3)

## 2014-04-19 NOTE — Progress Notes (Signed)
2D Echo Performed 04/19/2014    Tanner Yeley, RCS  

## 2014-04-21 ENCOUNTER — Encounter (HOSPITAL_COMMUNITY)
Admission: RE | Admit: 2014-04-21 | Discharge: 2014-04-21 | Disposition: A | Discharge status: Home / Self Care | Payer: Medicare Other | Source: Ambulatory Visit | Attending: Cardiology | Admitting: Cardiology

## 2014-04-21 DIAGNOSIS — I2510 Atherosclerotic heart disease of native coronary artery without angina pectoris: Secondary | ICD-10-CM | POA: Diagnosis present

## 2014-04-21 DIAGNOSIS — I252 Old myocardial infarction: Secondary | ICD-10-CM | POA: Insufficient documentation

## 2014-04-21 DIAGNOSIS — Z955 Presence of coronary angioplasty implant and graft: Secondary | ICD-10-CM | POA: Diagnosis not present

## 2014-04-24 ENCOUNTER — Telehealth (HOSPITAL_COMMUNITY): Payer: Self-pay | Admitting: *Deleted

## 2014-04-24 ENCOUNTER — Encounter (HOSPITAL_COMMUNITY)
Admission: RE | Admit: 2014-04-24 | Discharge: 2014-04-24 | Disposition: A | Discharge status: Home / Self Care | Payer: Medicare Other | Source: Ambulatory Visit | Attending: Cardiology | Admitting: Cardiology

## 2014-04-24 DIAGNOSIS — I2510 Atherosclerotic heart disease of native coronary artery without angina pectoris: Secondary | ICD-10-CM | POA: Diagnosis not present

## 2014-04-24 NOTE — Telephone Encounter (Signed)
Message copied by Cammy Copa on Mon Apr 24, 2014  9:59 AM ------      Message from: [place], PETER M      Created: Mon Apr 24, 2014  7:41 AM      Regarding: RE: Target Heart Rate Increase       You can increase his HR limit to 135            Thanks            Peter [place] MD      ----- Message -----         From: Hazle Nordmann         Sent: 04/21/2014  10:13 AM           To: Peter M [place], MD      Subject: Target Heart Rate Increase                               Dr.  [place],            Gabriel Rung has been our program for about 2 months.  He is doing great, but is now starting to exceed his THR of 124.  He is using the rowing machine and regularly reach at HR of 130s.  What would be a safe upper limit for him here at rehab?            Thanks for your help!      Fabio Pierce, MA, ACSM RCEP             ------

## 2014-04-26 ENCOUNTER — Encounter (HOSPITAL_COMMUNITY)
Admission: RE | Admit: 2014-04-26 | Discharge: 2014-04-26 | Disposition: A | Discharge status: Home / Self Care | Payer: Medicare Other | Source: Ambulatory Visit | Attending: Cardiology | Admitting: Cardiology

## 2014-04-26 DIAGNOSIS — I2510 Atherosclerotic heart disease of native coronary artery without angina pectoris: Secondary | ICD-10-CM | POA: Diagnosis not present

## 2014-04-28 ENCOUNTER — Encounter (HOSPITAL_COMMUNITY)
Admission: RE | Admit: 2014-04-28 | Discharge: 2014-04-28 | Disposition: A | Discharge status: Home / Self Care | Payer: Medicare Other | Source: Ambulatory Visit | Attending: Cardiology | Admitting: Cardiology

## 2014-04-28 DIAGNOSIS — I2510 Atherosclerotic heart disease of native coronary artery without angina pectoris: Secondary | ICD-10-CM | POA: Diagnosis not present

## 2014-05-01 ENCOUNTER — Encounter (HOSPITAL_COMMUNITY): Payer: Medicare Other

## 2014-05-03 ENCOUNTER — Encounter (HOSPITAL_COMMUNITY): Payer: Medicare Other

## 2014-05-05 ENCOUNTER — Encounter (HOSPITAL_COMMUNITY): Payer: Medicare Other

## 2014-05-08 ENCOUNTER — Encounter (HOSPITAL_COMMUNITY)
Admission: RE | Admit: 2014-05-08 | Discharge: 2014-05-08 | Disposition: A | Discharge status: Home / Self Care | Payer: Medicare Other | Source: Ambulatory Visit | Attending: Cardiology | Admitting: Cardiology

## 2014-05-08 DIAGNOSIS — I2510 Atherosclerotic heart disease of native coronary artery without angina pectoris: Secondary | ICD-10-CM | POA: Diagnosis not present

## 2014-05-10 ENCOUNTER — Encounter (HOSPITAL_COMMUNITY): Payer: Medicare Other

## 2014-05-12 ENCOUNTER — Encounter (HOSPITAL_COMMUNITY)
Admission: RE | Admit: 2014-05-12 | Discharge: 2014-05-12 | Disposition: A | Discharge status: Home / Self Care | Payer: Medicare Other | Source: Ambulatory Visit | Attending: Cardiology | Admitting: Cardiology

## 2014-05-12 DIAGNOSIS — I2510 Atherosclerotic heart disease of native coronary artery without angina pectoris: Secondary | ICD-10-CM | POA: Diagnosis not present

## 2014-05-15 ENCOUNTER — Encounter (HOSPITAL_COMMUNITY)
Admission: RE | Admit: 2014-05-15 | Discharge: 2014-05-15 | Disposition: A | Discharge status: Home / Self Care | Payer: Medicare Other | Source: Ambulatory Visit | Attending: Cardiology | Admitting: Cardiology

## 2014-05-15 DIAGNOSIS — I2510 Atherosclerotic heart disease of native coronary artery without angina pectoris: Secondary | ICD-10-CM | POA: Diagnosis not present

## 2014-05-15 NOTE — Progress Notes (Signed)
Henry Rogers reports experiencing shortness of breath when he goes up and down stairs and wants to know if this is normal. Patient denies experiencing any shortness of breath or chest pain with exercise at cardiac rehab. Henry Rogers said he did not have shortness of breath before his MI. Will notify Dr [place] of the patient's complaints via fax. Oxygen saturation 97% on room air.

## 2014-05-16 ENCOUNTER — Telehealth: Payer: Self-pay

## 2014-05-16 ENCOUNTER — Telehealth: Payer: Self-pay | Admitting: Cardiology

## 2014-05-16 NOTE — Telephone Encounter (Signed)
Returned call to patient he stated he gets sob when he climbs his stairs at home.Stated he is exercising vigorously at cardiac rehab without sob.Stated he would like to stay on brilinta.Advised to call back if sob continues.

## 2014-05-16 NOTE — Telephone Encounter (Signed)
Patient called no answer.Left message to call me back regarding sob.Dr.Jordan advised changing medication.

## 2014-05-16 NOTE — Telephone Encounter (Signed)
Returning your call. °

## 2014-05-17 ENCOUNTER — Encounter (HOSPITAL_COMMUNITY)
Admission: RE | Admit: 2014-05-17 | Discharge: 2014-05-17 | Disposition: A | Discharge status: Home / Self Care | Payer: Medicare Other | Source: Ambulatory Visit | Attending: Cardiology | Admitting: Cardiology

## 2014-05-17 DIAGNOSIS — I2510 Atherosclerotic heart disease of native coronary artery without angina pectoris: Secondary | ICD-10-CM | POA: Diagnosis not present

## 2014-05-19 ENCOUNTER — Encounter: Payer: Self-pay | Admitting: Surgery

## 2014-05-19 ENCOUNTER — Encounter (HOSPITAL_COMMUNITY)
Admission: RE | Admit: 2014-05-19 | Discharge: 2014-05-19 | Disposition: A | Discharge status: Home / Self Care | Payer: Medicare Other | Source: Ambulatory Visit | Attending: Cardiology | Admitting: Cardiology

## 2014-05-19 DIAGNOSIS — I2510 Atherosclerotic heart disease of native coronary artery without angina pectoris: Secondary | ICD-10-CM | POA: Diagnosis not present

## 2014-05-22 ENCOUNTER — Encounter: Payer: Self-pay | Admitting: Surgery

## 2014-05-22 ENCOUNTER — Other Ambulatory Visit: Payer: Self-pay | Admitting: Surgery

## 2014-05-22 ENCOUNTER — Encounter (HOSPITAL_COMMUNITY)
Admission: RE | Admit: 2014-05-22 | Discharge: 2014-05-22 | Disposition: A | Discharge status: Home / Self Care | Payer: PRIVATE HEALTH INSURANCE | Source: Ambulatory Visit | Attending: Cardiology | Admitting: Cardiology

## 2014-05-22 ENCOUNTER — Ambulatory Visit (HOSPITAL_COMMUNITY)
Admission: RE | Admit: 2014-05-22 | Discharge: 2014-05-22 | Disposition: A | Discharge status: Home / Self Care | Payer: PRIVATE HEALTH INSURANCE | Source: Ambulatory Visit | Attending: Surgery | Admitting: Surgery

## 2014-05-22 ENCOUNTER — Ambulatory Visit (INDEPENDENT_AMBULATORY_CARE_PROVIDER_SITE_OTHER): Payer: PRIVATE HEALTH INSURANCE | Admitting: Surgery

## 2014-05-22 VITALS — BP 108/78 | HR 82 | Temp 97.5°F | Resp 16 | Ht 66.0 in | Wt 183.0 lb

## 2014-05-22 DIAGNOSIS — I83019 Varicose veins of right lower extremity with ulcer of unspecified site: Secondary | ICD-10-CM

## 2014-05-22 DIAGNOSIS — I83013 Varicose veins of right lower extremity with ulcer of ankle: Secondary | ICD-10-CM

## 2014-05-22 DIAGNOSIS — L97919 Non-pressure chronic ulcer of unspecified part of right lower leg with unspecified severity: Secondary | ICD-10-CM

## 2014-05-22 DIAGNOSIS — I83011 Varicose veins of right lower extremity with ulcer of thigh: Secondary | ICD-10-CM

## 2014-05-22 DIAGNOSIS — I83015 Varicose veins of right lower extremity with ulcer other part of foot: Secondary | ICD-10-CM

## 2014-05-22 DIAGNOSIS — I2510 Atherosclerotic heart disease of native coronary artery without angina pectoris: Secondary | ICD-10-CM | POA: Insufficient documentation

## 2014-05-22 DIAGNOSIS — I83014 Varicose veins of right lower extremity with ulcer of heel and midfoot: Secondary | ICD-10-CM

## 2014-05-22 DIAGNOSIS — I83018 Varicose veins of right lower extremity with ulcer other part of lower leg: Secondary | ICD-10-CM

## 2014-05-22 DIAGNOSIS — Z955 Presence of coronary angioplasty implant and graft: Secondary | ICD-10-CM | POA: Insufficient documentation

## 2014-05-22 DIAGNOSIS — I252 Old myocardial infarction: Secondary | ICD-10-CM | POA: Insufficient documentation

## 2014-05-22 DIAGNOSIS — I8393 Asymptomatic varicose veins of bilateral lower extremities: Secondary | ICD-10-CM

## 2014-05-22 DIAGNOSIS — I83012 Varicose veins of right lower extremity with ulcer of calf: Secondary | ICD-10-CM

## 2014-05-22 NOTE — Patient Instructions (Signed)
Varicose Veins Varicose veins are veins that have become enlarged and twisted. CAUSES This condition is the result of valves in the veins not working properly. Valves in the veins help return blood from the leg to the heart. If these valves are damaged, blood flows backwards and backs up into the veins in the leg near the skin. This causes the veins to become larger. People who are on their feet a lot, who are pregnant, or who are overweight are more likely to develop varicose veins. SYMPTOMS   Bulging, twisted-appearing, bluish veins, most commonly found on the legs.  Leg pain or a feeling of heaviness. These symptoms may be worse at the end of the day.  Leg swelling.  Skin color changes. DIAGNOSIS  Varicose veins can usually be diagnosed with an exam of your legs by your caregiver. He or she may recommend an ultrasound of your leg veins. TREATMENT  Most varicose veins can be treated at home.However, other treatments are available for people who have persistent symptoms or who want to treat the cosmetic appearance of the varicose veins. These include:  Laser treatment of very small varicose veins.  Medicine that is shot (injected) into the vein. This medicine hardens the walls of the vein and closes off the vein. This treatment is called sclerotherapy. Afterwards, you may need to wear clothing or bandages that apply pressure.  Surgery. HOME CARE INSTRUCTIONS   Do not stand or sit in one position for long periods of time. Do not sit with your legs crossed. Rest with your legs raised during the day.  Wear elastic stockings or support hose. Do not wear other tight, encircling garments around the legs, pelvis, or waist.  Walk as much as possible to increase blood flow.  Raise the foot of your bed at night with 2-inch blocks.  If you get a cut in the skin over the vein and the vein bleeds, lie down with your leg raised and press on it with a clean cloth until the bleeding stops. Then  place a bandage (dressing) on the cut. See your caregiver if it continues to bleed or needs stitches. SEEK MEDICAL CARE IF:   The skin around your ankle starts to break down.  You have pain, redness, tenderness, or hard swelling developing in your leg over a vein.  You are uncomfortable due to leg pain. Document Released: 04/16/2005 Document Revised: 09/29/2011 Document Reviewed: 09/02/2010 Exit[employer] Patient Information 2015 [employer]. This information is not intended to replace advice given to you by your health care provider. Make sure you discuss any questions you have with your health care provider.  

## 2014-05-22 NOTE — Progress Notes (Signed)
HISTORY AND PHYSICAL     CC:  Varicose veins Referring Provider:  [name], [name], MD  HPI: This is a 63 y.o. Henry Nianmale who presents today with complaints of varicose veins.  He states that he has had these for sometime, but he has started getting symptoms of aching and swelling in his right leg.  His leg aches after he has been up walking and on his feet and has swelling by the end of the day.  He states that he does have varicosities in the left leg, but he does not have the symptoms in this leg.  He denies any hx of DVT.  He does have a family hx as his mother had varicosities and had vein stripping.    He did have a STEMI in July 2015 at which time, he underwent stenting of the RCA and is now on Brilinta..  He states that he wears a compression sleeve during cardiac rehab and while walking long distances.  Elevation also improved his symptoms.    He is on a statin for hypercholesterolemia.  He is on a beta blocker for his ischemic cardiomyopathy.  His ACEI was discontinued due to hypotension.    Past Medical History  Diagnosis Date  . Hemorrhoids   . Tinnitus   . Hearing loss   . HH (hiatus hernia)   . GERD (gastroesophageal reflux disease)   . OSA (obstructive sleep apnea)     a. mild not requiring CPAP at this time  . CAD (coronary artery disease)     a. inf STEMI s/p BMS to mRCA (02/03/14)  . Ischemic cardiomyopathy     a. ECHO 01/2014 EF 45-50% and akinesis of the entire inferior myocardium. Mild MR.   Henry Rogers. STEMI (ST elevation myocardial infarction) 02/02/14    BMS to RCA  . Varicosities of leg     Past Surgical History  Procedure Laterality Date  . Colonoscopy  2006    Dr. Elnoria HowardHung  . Coronary angioplasty with stent placement  02/02/14    BMS to RCA    No Known Allergies  Current Outpatient Prescriptions  Medication Sig Dispense Refill  . aspirin 81 MG tablet Take 81 mg by mouth daily.    Henry Rogers. atorvastatin (LIPITOR) 80 MG tablet Take 1 tablet (80 mg total) by mouth daily at 6  PM. 30 tablet 11  . ibuprofen (ADVIL,MOTRIN) 200 MG tablet Take 200 mg by mouth every 6 (six) hours as needed for mild pain. Takes as needed for muscle aches    . Melatonin 10 MG CAPS Take 10 mg by mouth at bedtime as needed (for sleep).     . metoprolol tartrate (LOPRESSOR) 12.5 mg TABS tablet Take 12.5 mg (half tablet) twice daily 60 tablet 11  . Multiple Vitamin (MULTIVITAMIN) tablet Take 1 tablet by mouth daily.    . nitroGLYCERIN (NITROSTAT) 0.4 MG SL tablet Place 1 tablet (0.4 mg total) under the tongue every 5 (five) minutes x 3 doses as needed for chest pain. 25 tablet 12  . Nutritional Supplements (JUICE PLUS FIBRE PO) Take 1 tablet by mouth. Vegetable blend caps    . Nutritional Supplements (JUICE PLUS FIBRE PO) Take 1 tablet by mouth 2 (two) times daily. Fruit blend    . omeprazole (PRILOSEC) 10 MG capsule Take 10 mg by mouth daily.    . ranitidine (ZANTAC) 75 MG tablet Take 75 mg by mouth at bedtime as needed for heartburn.    . tamsulosin (FLOMAX) 0.4 MG CAPS capsule  Take 2 capsules (0.8 mg total) by mouth daily. 180 capsule 3  . ticagrelor (BRILINTA) 90 MG TABS tablet Take 1 tablet (90 mg total) by mouth 2 (two) times daily. 60 tablet 11   No current facility-administered medications for this visit.    Family History  Problem Relation Age of Onset  . Hypertension Mother   . Cancer Maternal Grandmother     History   Social History  . Marital Status: Married    Spouse Name: N/A    Number of Children: N/A  . Years of Education: N/A   Occupational History  . Not on file.   Social History Main Topics  . Smoking status: Former Games developer  . Smokeless tobacco: Not on file  . Alcohol Use: 1.8 oz/week    3 Glasses of wine per week  . Drug Use: No  . Sexual Activity: Yes   Other Topics Concern  . Not on file   Social History Narrative     ROS: [x]  Positive   [ ]  Negative   [ ]  All sytems reviewed and are negative  Cardiovascular: []  chest pain/pressure []   palpitations [x]  hx MI in July []  SOB lying flat []  DOE []  pain in legs while walking []  pain in feet when lying flat []  hx of DVT []  hx of phlebitis []  swelling in legs [x]  varicose veins  Pulmonary: []  productive cough [x]  sleep apnea []  asthma []  wheezing  Neurologic: []  weakness in []  arms []  legs []  numbness in []  arms []  legs [] difficulty speaking or slurred speech []  temporary loss of vision in one eye []  dizziness  Hematologic: []  bleeding problems []  problems with blood clotting easily  GI []  vomiting blood []  blood in stool  GU: []  burning with urination []  blood in urine [x]  hx BPH  Psychiatric: []  hx of major depression  Integumentary: []  rashes []  ulcers  Constitutional: []  fever []  chills   PHYSICAL EXAMINATION:  Filed Vitals:   05/22/14 1406  BP: 108/78  Pulse: 82  Temp: 97.5 F (36.4 C)  Resp: 16   Body mass index is 29.55 kg/(m^2).  General:  WDWN in NAD Gait: Normal HENT: WNL, normocephalic Pulmonary: normal non-labored breathing , without Rales, rhonchi,  wheezing Cardiac: RRR, without  Murmurs, rubs or gallops; without carotid bruits Abdomen: soft, NT, no masses Skin: without rashes, without ulcers  Vascular Exam/Pulses:  Right Left  Radial 2+ (normal) 2+ (normal)  Ulnar 1+ (weak) 1+ (weak)  Popliteal Unable to palpate  1+ (weak)  DP 2+ (normal) 2+ (normal)  PT 2+ (normal) 2+ (normal)   Extremities: without ischemic changes, without Gangrene , without cellulitis; without open wounds; + varicosities right leg > left leg Musculoskeletal: no muscle wasting or atrophy  Neurologic: A&O X 3; Appropriate Affect ; SENSATION: normal; MOTOR FUNCTION:  moving all extremities equally. Speech is fluent/normal   Non-Invasive Vascular Imaging:   1.  There is no evidence of DVT or superficial vein thrombosis 2.  There is evidence of deep vein reflux in the RLE 3.  There is evidence of great saphenous vein reflux in the RLE 4.   There is evidence of an incompetent anterior saphenous branch 5.  The small saphenous vein is competent.  Pt meds includes: Statin:  Yes.   Beta Blocker:  Yes.   Aspirin:  Yes.   ACEI:  No. ARB:  No. Other Antiplatelet/Anticoagulant:  Yes.   Brilinta    ASSESSMENT/PLAN:: 63 y.o. 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     -We will have him wear thigh high compression stockings for 3 months.  -continue elevation of legs -we will have him follow up in 3 months with Dr. Arbie Cookey or Dr. Hart Rochester for ablation.   Doreatha Massed, PA-C [facility] [phone]  Clinic MD:  Pt seen and examined in conjunction with Dr. Myra Gianotti  I agree with the above.  I have seen and examined the patient.  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  These have been causing him progressive symptoms of pain and discomfort, particularly at the end of a long day.  He also complains of swelling in his right leg.  He has worn a sleeve on the calf which did help his symptoms.  He has not worn compression stockings.  He does not have ulcers.  He has no history of DVT.  The patient is on Brilenta for his recent cardiac stent.  The patient will be placed and 20-30 thigh-high compression stockings.  I'll have him follow-up in 3 months to see Dr. Hart Rochester for consideration of endovenous ablation of the right great saphenous vein and stab phlebectomy of varicosities.  He did have superficial reflux on his ultrasound study today.  I did discuss with the patient that this would be the same vein that would be harvested should he ever progress his cardiac disease and required CABG.   Durene Cal

## 2014-05-24 ENCOUNTER — Encounter (HOSPITAL_COMMUNITY): Payer: PRIVATE HEALTH INSURANCE

## 2014-05-24 DIAGNOSIS — I252 Old myocardial infarction: Secondary | ICD-10-CM | POA: Diagnosis not present

## 2014-05-24 DIAGNOSIS — Z955 Presence of coronary angioplasty implant and graft: Secondary | ICD-10-CM | POA: Diagnosis not present

## 2014-05-24 DIAGNOSIS — I2510 Atherosclerotic heart disease of native coronary artery without angina pectoris: Secondary | ICD-10-CM | POA: Diagnosis not present

## 2014-05-26 ENCOUNTER — Encounter (HOSPITAL_COMMUNITY)
Admission: RE | Admit: 2014-05-26 | Discharge: 2014-05-26 | Disposition: A | Discharge status: Home / Self Care | Payer: PRIVATE HEALTH INSURANCE | Source: Ambulatory Visit | Attending: Cardiology | Admitting: Cardiology

## 2014-05-26 DIAGNOSIS — I2510 Atherosclerotic heart disease of native coronary artery without angina pectoris: Secondary | ICD-10-CM | POA: Diagnosis not present

## 2014-05-29 ENCOUNTER — Encounter (HOSPITAL_COMMUNITY): Payer: PRIVATE HEALTH INSURANCE

## 2014-05-31 ENCOUNTER — Encounter (HOSPITAL_COMMUNITY): Payer: PRIVATE HEALTH INSURANCE

## 2014-06-02 ENCOUNTER — Encounter (HOSPITAL_COMMUNITY): Payer: PRIVATE HEALTH INSURANCE

## 2014-06-05 ENCOUNTER — Encounter (HOSPITAL_COMMUNITY): Payer: PRIVATE HEALTH INSURANCE

## 2014-06-07 ENCOUNTER — Encounter (HOSPITAL_COMMUNITY): Payer: PRIVATE HEALTH INSURANCE

## 2014-06-09 ENCOUNTER — Encounter (HOSPITAL_COMMUNITY): Payer: PRIVATE HEALTH INSURANCE

## 2014-06-12 ENCOUNTER — Encounter (HOSPITAL_COMMUNITY): Payer: PRIVATE HEALTH INSURANCE

## 2014-06-14 ENCOUNTER — Encounter (HOSPITAL_COMMUNITY): Payer: PRIVATE HEALTH INSURANCE

## 2014-06-19 ENCOUNTER — Encounter (HOSPITAL_COMMUNITY): Payer: PRIVATE HEALTH INSURANCE

## 2014-06-20 ENCOUNTER — Telehealth (HOSPITAL_COMMUNITY): Payer: Self-pay | Admitting: *Deleted

## 2014-06-21 ENCOUNTER — Encounter (HOSPITAL_COMMUNITY)
Admission: RE | Admit: 2014-06-21 | Discharge: 2014-06-21 | Disposition: A | Discharge status: Home / Self Care | Payer: Medicare Other | Source: Ambulatory Visit | Attending: Cardiology | Admitting: Cardiology

## 2014-06-21 DIAGNOSIS — I252 Old myocardial infarction: Secondary | ICD-10-CM | POA: Insufficient documentation

## 2014-06-21 DIAGNOSIS — I2510 Atherosclerotic heart disease of native coronary artery without angina pectoris: Secondary | ICD-10-CM | POA: Insufficient documentation

## 2014-06-21 DIAGNOSIS — Z955 Presence of coronary angioplasty implant and graft: Secondary | ICD-10-CM | POA: Insufficient documentation

## 2014-06-21 NOTE — Progress Notes (Signed)
Henry Rogers returned to exercise today after being out of town for the birth of his grandchild.

## 2014-06-23 ENCOUNTER — Encounter (HOSPITAL_COMMUNITY)
Admission: RE | Admit: 2014-06-23 | Discharge: 2014-06-23 | Disposition: A | Discharge status: Home / Self Care | Payer: Medicare Other | Source: Ambulatory Visit | Attending: Cardiology | Admitting: Cardiology

## 2014-06-23 DIAGNOSIS — I2510 Atherosclerotic heart disease of native coronary artery without angina pectoris: Secondary | ICD-10-CM | POA: Diagnosis not present

## 2014-06-26 ENCOUNTER — Encounter (HOSPITAL_COMMUNITY)
Admission: RE | Admit: 2014-06-26 | Discharge: 2014-06-26 | Disposition: A | Discharge status: Home / Self Care | Payer: Medicare Other | Source: Ambulatory Visit | Attending: Cardiology | Admitting: Cardiology

## 2014-06-26 DIAGNOSIS — I2510 Atherosclerotic heart disease of native coronary artery without angina pectoris: Secondary | ICD-10-CM | POA: Diagnosis not present

## 2014-06-28 ENCOUNTER — Encounter (HOSPITAL_COMMUNITY)
Admission: RE | Admit: 2014-06-28 | Discharge: 2014-06-28 | Disposition: A | Discharge status: Home / Self Care | Payer: Medicare Other | Source: Ambulatory Visit | Attending: Cardiology | Admitting: Cardiology

## 2014-06-28 DIAGNOSIS — I2510 Atherosclerotic heart disease of native coronary artery without angina pectoris: Secondary | ICD-10-CM | POA: Diagnosis not present

## 2014-06-29 ENCOUNTER — Encounter (HOSPITAL_COMMUNITY): Payer: Self-pay | Admitting: Cardiology

## 2014-06-29 NOTE — Progress Notes (Signed)
Pt will graduate from the cardiac rehab phase II program with the completion of 36 visits on 07/03/14.  Pt good excellent attendance to exercise and education classes. Pt had scheduled absence due to birth of his first grand child.  Pt showed measurable growth with met level progression.  Pt met level in September 4.1 to 6.2 in December.  Medication list reconciled.  Pt verbalizes compliance to medication and denies any barriers.  Repeat PHQ2 score 0.  Pt feels he has adequate support system and demonstrates appropriate and healthy coping skills.  Pt has a positive outlook on life and is looking forward to spending quality time with his grandchild.  Pt plans to continue home exercise with either [employer]  and walking in his  in his neighborhood every day.  Pt did express interest in the maintenance program due to the structure and accountability of the maintenance program.   Pt given information regarding the maintenance program here at [facility] .  Pt partially met his  short term goal to increase stamina.  Pt feels confidence in his activities of daily living and with exercise here at cardiac rehab.  Pt has struggled with weight loss and admits he has the tools to make the changes just lacks the follow through. Pt acknowledges that he has made many changes in his diet but needs to make more.  Pt long term goal is to be healthy and longevity.  Pt feels he has achieved this goal based upon the activities of the home he can do without andy difficultly and his new "zeal" for life.  It was a pleasure to have pt participate in cardiac rehab. Cherre Huger, BSN

## 2014-06-30 ENCOUNTER — Encounter (HOSPITAL_COMMUNITY): Payer: Medicare Other

## 2014-07-03 ENCOUNTER — Encounter (HOSPITAL_COMMUNITY)
Admission: RE | Admit: 2014-07-03 | Discharge: 2014-07-03 | Disposition: A | Discharge status: Home / Self Care | Payer: Medicare Other | Source: Ambulatory Visit | Attending: Cardiology | Admitting: Cardiology

## 2014-07-03 DIAGNOSIS — I2510 Atherosclerotic heart disease of native coronary artery without angina pectoris: Secondary | ICD-10-CM | POA: Diagnosis not present

## 2014-07-04 ENCOUNTER — Ambulatory Visit: Payer: Medicare Other | Admitting: Cardiology

## 2014-07-05 ENCOUNTER — Encounter (HOSPITAL_COMMUNITY): Payer: Medicare Other

## 2014-07-07 ENCOUNTER — Encounter (HOSPITAL_COMMUNITY): Payer: Medicare Other

## 2014-07-31 ENCOUNTER — Encounter: Payer: Self-pay | Admitting: Family Medicine

## 2014-07-31 ENCOUNTER — Ambulatory Visit (INDEPENDENT_AMBULATORY_CARE_PROVIDER_SITE_OTHER): Payer: Medicare Other | Admitting: Family Medicine

## 2014-07-31 VITALS — BP 120/76 | HR 72 | Wt 188.0 lb

## 2014-07-31 DIAGNOSIS — K219 Gastro-esophageal reflux disease without esophagitis: Secondary | ICD-10-CM

## 2014-07-31 MED ORDER — OMEPRAZOLE 40 MG PO CPDR: 40.0000 mg | DELAYED_RELEASE_CAPSULE | Freq: Every day | ORAL | Status: DC

## 2014-07-31 NOTE — Progress Notes (Signed)
   Subjective:    Patient ID: Henry Rogers, male    DOB: [birth year], 64 y.o.   MRN: [id number]  HPI He is here for consult concerning continued difficulty with reflux symptoms. He does have reflux disease as well as a hiatus hernia. He has had several episodes in the last month of waking up in the middle night with reflux symptoms. He does take Prilosec 20 mg regularly. He does keep the head of his bed elevated. He notes that especially tomato based sauces cause difficulty. He eats around 6 or 7 and usually goes to bed around 10 or 11. Occasionally he will use his Zantac if he has more difficulty.   Review of Systems     Objective:   Physical Exam Alert and in no distress otherwise not examined       Assessment & Plan:  Gastroesophageal reflux disease without esophagitis - Plan: omeprazole (PRILOSEC) 40 MG capsule  I will increase him to the 40 mg. Discussed the use of chocolate and licorice and the need to avoid these. Also discussed the fact that certain foods can predispose him to indigestion and that's a very individual issue. If the Prilosec doesn't work, I will try him on another PPI before referring to GI. He did mention surgical intervention and I said at this point it would not be appropriate

## 2014-08-04 ENCOUNTER — Other Ambulatory Visit: Payer: Self-pay | Admitting: Family Medicine

## 2014-08-21 ENCOUNTER — Encounter: Payer: Self-pay | Admitting: Vascular Surgery

## 2014-08-22 ENCOUNTER — Other Ambulatory Visit: Payer: Self-pay | Admitting: *Deleted

## 2014-08-22 ENCOUNTER — Encounter: Payer: Self-pay | Admitting: Vascular Surgery

## 2014-08-22 ENCOUNTER — Ambulatory Visit: Payer: PRIVATE HEALTH INSURANCE | Admitting: Vascular Surgery

## 2014-08-22 ENCOUNTER — Ambulatory Visit (INDEPENDENT_AMBULATORY_CARE_PROVIDER_SITE_OTHER): Payer: Medicare Other | Admitting: Vascular Surgery

## 2014-08-22 VITALS — BP 129/90 | HR 75 | Resp 18 | Ht 65.5 in | Wt 192.2 lb

## 2014-08-22 DIAGNOSIS — I83899 Varicose veins of unspecified lower extremities with other complications: Secondary | ICD-10-CM | POA: Insufficient documentation

## 2014-08-22 DIAGNOSIS — I83891 Varicose veins of right lower extremities with other complications: Secondary | ICD-10-CM

## 2014-08-22 NOTE — Progress Notes (Signed)
Problems with Activities of Daily Living Secondary to Leg Pain  1. Mr. Fiebelkorn states he walks for exercise and he has had to decrease the duration and frequency of this form of exercise due to leg pain.    2. Mr. Kromer states has difficulty with travel (and with walking while traveling) due to leg pain.    3. Mr. Freid states that he has difficulty sleeping due to leg pain.     Failure of  Conservative Therapy:  1. Worn 20-30 mm Hg thigh high compression hose >3 months with no relief of symptoms.  2. Frequently elevates legs-no relief of symptoms  3. Taken Ibuprofen 600 Mg TID with no relief of symptoms.  Patient returns today for continued discussion of his right leg venous hypertension. He has been compliant with his compression garments but continues to report significant discomfort associated with his calf vein and thigh varicosities. He has no symptoms in his left leg. This is particularly bothersome with prolonged standing and walking.  Past Medical History  Diagnosis Date  . Hemorrhoids   . Tinnitus   . Hearing loss   . HH (hiatus hernia)   . GERD (gastroesophageal reflux disease)   . OSA (obstructive sleep apnea)     a. mild not requiring CPAP at this time  . CAD (coronary artery disease)     a. inf STEMI s/p BMS to mRCA (02/03/14)  . Ischemic cardiomyopathy     a. ECHO 01/2014 EF 45-50% and akinesis of the entire inferior myocardium. Mild MR.   [name] STEMI (ST elevation myocardial infarction) 02/02/14    BMS to RCA  . Varicosities of leg   . Varicose veins     History  Substance Use Topics  . Smoking status: Former Games developer  . Smokeless tobacco: Not on file  . Alcohol Use: 1.8 oz/week    3 Glasses of wine per week    Family History  Problem Relation Age of Onset  . Hypertension Mother   . Cancer Maternal Grandmother     No Known Allergies   Current outpatient prescriptions:  .  aspirin 81 MG tablet, Take 81 mg by mouth daily., Disp: , Rfl:  .  atorvastatin  (LIPITOR) 80 MG tablet, Take 1 tablet (80 mg total) by mouth daily at 6 PM., Disp: 30 tablet, Rfl: 11 .  Melatonin 10 MG CAPS, Take 10 mg by mouth at bedtime as needed (for sleep). , Disp: , Rfl:  .  metoprolol tartrate (LOPRESSOR) 12.5 mg TABS tablet, Take 12.5 mg (half tablet) twice daily, Disp: 60 tablet, Rfl: 11 .  Multiple Vitamin (MULTIVITAMIN) tablet, Take 1 tablet by mouth daily., Disp: , Rfl:  .  nitroGLYCERIN (NITROSTAT) 0.4 MG SL tablet, Place 1 tablet (0.4 mg total) under the tongue every 5 (five) minutes x 3 doses as needed for chest pain., Disp: 25 tablet, Rfl: 12 .  Nutritional Supplements (JUICE PLUS FIBRE PO), Take 1 tablet by mouth 2 (two) times daily. Fruit blend, Disp: , Rfl:  .  omeprazole (PRILOSEC) 40 MG capsule, Take 1 capsule (40 mg total) by mouth daily., Disp: 90 capsule, Rfl: 3 .  ranitidine (ZANTAC) 75 MG tablet, Take 75 mg by mouth at bedtime as needed for heartburn., Disp: , Rfl:  .  tamsulosin (FLOMAX) 0.4 MG CAPS capsule, TAKE 2 CAPSULES BY MOUTH DAILY, Disp: 180 capsule, Rfl: 0 .  ticagrelor (BRILINTA) 90 MG TABS tablet, Take 1 tablet (90 mg total) by mouth 2 (two) times daily., Disp: 60  tablet, Rfl: 11  BP 129/90 mmHg  Pulse 75  Resp 18  Ht 5' 5.5" (1.664 m)  Wt 192 lb 3.2 oz (87.181 kg)  BMI 31.49 kg/m2  Body mass index is 31.49 kg/(m^2).       On physical exam he does have 2+ dorsalis pedis pulses bilaterally. No evidence of varicosities in his left leg or swelling. On the right he does have varicosities in his medial thigh and in his calf as well. No skin changes associated with venous hypertension  I reimage his saphenous vein with SonoSite ultrasound this does show enlarged refluxing great saphenous vein with multiple tributary varicosities arising from this.  Impression and plan: Failed conservative treatment of venous hypertension right leg. I have recommended laser ablation of his right great saphenous vein. Explained this as an outpatient  procedure under local anesthesia in our office. I explained the expected recovery. I feel comfortable proceeding on his Brilinta. Did explain the very slight risk of DVT with the procedure. We will schedule this at his earliest convenience. I did explain that he may require a staged stab phlebectomy of varicosities if he continues to have pain with distention. Also explained that Medicare will not this as a combined procedure with laser ablation.

## 2014-08-24 ENCOUNTER — Telehealth: Payer: Self-pay | Admitting: Vascular Surgery

## 2014-08-24 NOTE — Telephone Encounter (Signed)
-----   Message from Reche Dixon, RN sent at 08/22/2014  4:28 PM EST ----- Regarding: scheduling Please schedule Henry Rogers for post LA duplex (right leg, order in EPIC) and VV FU with Dr. Arbie Cookey on 10-19-2014.  Thanks!

## 2014-09-28 ENCOUNTER — Ambulatory Visit (INDEPENDENT_AMBULATORY_CARE_PROVIDER_SITE_OTHER): Payer: Medicare Other | Admitting: Cardiology

## 2014-09-28 ENCOUNTER — Encounter: Payer: Self-pay | Admitting: Cardiology

## 2014-09-28 VITALS — BP 106/74 | HR 86 | Ht 66.0 in | Wt 194.2 lb

## 2014-09-28 DIAGNOSIS — E785 Hyperlipidemia, unspecified: Secondary | ICD-10-CM

## 2014-09-28 DIAGNOSIS — I2510 Atherosclerotic heart disease of native coronary artery without angina pectoris: Secondary | ICD-10-CM

## 2014-09-28 DIAGNOSIS — I255 Ischemic cardiomyopathy: Secondary | ICD-10-CM

## 2014-09-28 NOTE — Patient Instructions (Signed)
You may stop Brilinta in July.  Continue your other therapy  I will see you in 6 months with lab work.

## 2014-09-28 NOTE — Progress Notes (Signed)
Henry Rogers Date of Birth: 08-28-1950 Medical Record #[id number]  History of Present Illness: Henry Rogers is seen for follow up CAD. He is s/p inferior STEMI on 02/02/14. The Mid RCA was occluded with large thrombus load. He had cardiogenic shock. The RCA was stented with a 5.0x16 mm Veriflex stent. EF was 45-50%. Other vessels had no significant disease. Repeat Echo in September 2015 showed that his LV function had normalized. EF 55-60%.  On follow up today he is feeling very well. No chest pain or SOB. He exercises daily and does a lot of physical yard work without limitation. He is scheduled for laser treatment of painful varicose veins later this month with Dr. Arbie Cookey.     Medication List       This list is accurate as of: 09/28/14  8:46 AM.  Always use your most recent med list.               aspirin 81 MG tablet  Take 81 mg by mouth daily.     atorvastatin 80 MG tablet  Commonly known as:  LIPITOR  Take 1 tablet (80 mg total) by mouth daily at 6 PM.     JUICE PLUS FIBRE PO  Take 1 tablet by mouth 2 (two) times daily. Fruit blend     Melatonin 10 MG Caps  Take 10 mg by mouth at bedtime as needed (for sleep).     metoprolol tartrate 12.5 mg Tabs tablet  Commonly known as:  LOPRESSOR  Take 12.5 mg (half tablet) twice daily     multivitamin tablet  Take 1 tablet by mouth daily.     nitroGLYCERIN 0.4 MG SL tablet  Commonly known as:  NITROSTAT  Place 1 tablet (0.4 mg total) under the tongue every 5 (five) minutes x 3 doses as needed for chest pain.     omeprazole 40 MG capsule  Commonly known as:  PRILOSEC  Take 1 capsule (40 mg total) by mouth daily.     ranitidine 75 MG tablet  Commonly known as:  ZANTAC  Take 75 mg by mouth at bedtime as needed for heartburn.     tamsulosin 0.4 MG Caps capsule  Commonly known as:  FLOMAX  TAKE 2 CAPSULES BY MOUTH DAILY     ticagrelor 90 MG Tabs tablet  Commonly known as:  BRILINTA  Take 1 tablet (90 mg total) by mouth 2  (two) times daily.        No Known Allergies  Past Medical History  Diagnosis Date  . Hemorrhoids   . Tinnitus   . Hearing loss   . HH (hiatus hernia)   . GERD (gastroesophageal reflux disease)   . OSA (obstructive sleep apnea)     a. mild not requiring CPAP at this time  . CAD (coronary artery disease)     a. inf STEMI s/p BMS to mRCA (02/03/14)  . Ischemic cardiomyopathy     a. ECHO 01/2014 EF 45-50% and akinesis of the entire inferior myocardium. Mild MR.   [name] STEMI (ST elevation myocardial infarction) 02/02/14    BMS to RCA  . Varicosities of leg   . Varicose veins     Past Surgical History  Procedure Laterality Date  . Colonoscopy  2006    Dr. Elnoria Howard  . Coronary angioplasty with stent placement  02/02/14    BMS to RCA  . Left heart catheterization with coronary angiogram N/A 02/02/2014    Procedure: LEFT HEART CATHETERIZATION WITH CORONARY ANGIOGRAM;  Surgeon: Peter M Swaziland, MD;  Location: [facility] CATH LAB;  Service: Cardiovascular;  Laterality: N/A;    History   Social History  . Marital Status: Married    Spouse Name: N/A  . Number of Children: N/A  . Years of Education: N/A   Social History Main Topics  . Smoking status: Former Games developer  . Smokeless tobacco: Not on file  . Alcohol Use: 1.8 oz/week    3 Glasses of wine per week  . Drug Use: No  . Sexual Activity: Yes   Other Topics Concern  . None   Social History Narrative    Family History  Problem Relation Age of Onset  . Hypertension Mother   . Cancer Maternal Grandmother     Review of Systems: As noted in HPI.  He also complains of chronic GERD related to hiatal hernia. Currently on double dose of Prilosec., All other systems were reviewed and are negative.  Physical Exam: BP 106/74 mmHg  Pulse 86  Ht  (1.676 m)  Wt 194 lb 3.2 oz (88.089 kg)  BMI 31.36 kg/m2 Filed Weights   09/28/14 0832  Weight: 194 lb 3.2 oz (88.089 kg)  GENERAL:  Well appearing WM in NAD HEENT:  PERRL, EOMI, sclera  are clear. Oropharynx is clear. NECK:  No jugular venous distention, carotid upstroke brisk and symmetric, no bruits, no thyromegaly or adenopathy LUNGS:  Clear to auscultation bilaterally CHEST:  Unremarkable HEART:  RRR,  PMI not displaced or sustained,S1 and S2 within normal limits, no S3, no S4: no clicks, no rubs, no murmurs ABD:  Soft, nontender. BS +, no masses or bruits. No hepatomegaly, no splenomegaly EXT:  2 + pulses throughout, no edema, + varicose veins SKIN:  Warm and dry.  No rashes NEURO:  Alert and oriented x 3. Cranial nerves II through XII intact. PSYCH:  Cognitively intact    LABORATORY DATA: Lab Results  Component Value Date   WBC 8.3 02/03/2014   HGB 12.1* 02/03/2014   HCT 35.9* 02/03/2014   PLT 176 02/03/2014   GLUCOSE 96 04/18/2014   CHOL 110 04/18/2014   TRIG 144 04/18/2014   HDL 41 04/18/2014   LDLCALC 40 04/18/2014   ALT 28 04/18/2014   AST 25 04/18/2014   NA 139 04/18/2014   K 4.4 04/18/2014   CL 104 04/18/2014   CREATININE 1.12 04/18/2014   BUN 16 04/18/2014   CO2 22 04/18/2014   TSH 0.973 02/02/2014   PSA 0.80 02/22/2013   INR 1.03 02/02/2014   HGBA1C 5.8* 02/02/2014   Echo: 04/19/14:Study Conclusions  - Left ventricle: The cavity size was normal. Wall thickness was increased in a pattern of mild LVH. Systolic function was normal. The estimated ejection fraction was in the range of 50% to 55%. Wall motion was normal; there were no regional wall motion abnormalities. Doppler parameters are consistent with abnormal left ventricular relaxation (grade 1 diastolic dysfunction). - Aortic valve: Mildly calcified annulus. Mildly calcified leaflets. - Mitral valve: Mildly calcified annulus. There was mild regurgitation. Valve area by pressure half-time: 1.86 cm^2. Valve area by continuity equation (using LVOT flow): 1.88 cm^2. - Pericardium, extracardiac: A trivial pericardial effusion was identified.  Assessment / Plan: 1.  CAD s/p inferior STEMI 02/02/14 with cardiogenic shock. S/p BMS of mid RCA. He is asymptomatic.  Continue DAPT for one year. Plan to discontinue Brilinta in July. Continue statin and metoprolol.  Follow up in 6 months.  2. Ischemic cardiomyopathy with EF 45-50%. Repeat Echo showed  normal LV function. ACEi stopped last visit due to hypotension. Continue low dose metoprolol.  3. Hyperlipidemia. Excellent control.  Continue high dose statin. Continue dietary modification. Will repeat fasting lab work in 6 months. If levels continue to be this low we may be able to reduce dose of lipitor..  4. GERD  5. Varicose veins- painful. He has failed conservative therapy. Plan to proceed with laser treatment.

## 2014-10-10 ENCOUNTER — Ambulatory Visit: Payer: PRIVATE HEALTH INSURANCE | Admitting: Cardiology

## 2014-10-11 ENCOUNTER — Encounter: Payer: Self-pay | Admitting: Vascular Surgery

## 2014-10-12 ENCOUNTER — Encounter: Payer: Self-pay | Admitting: Vascular Surgery

## 2014-10-12 ENCOUNTER — Ambulatory Visit (INDEPENDENT_AMBULATORY_CARE_PROVIDER_SITE_OTHER): Payer: Medicare Other | Admitting: Vascular Surgery

## 2014-10-12 VITALS — BP 126/85 | HR 73 | Resp 18 | Ht 66.0 in | Wt 184.0 lb

## 2014-10-12 DIAGNOSIS — I83891 Varicose veins of right lower extremities with other complications: Secondary | ICD-10-CM

## 2014-10-12 HISTORY — PX: ENDOVENOUS ABLATION SAPHENOUS VEIN W/ LASER: SUR449

## 2014-10-12 NOTE — Progress Notes (Signed)
     Laser Ablation Procedure    Date: 10/12/2014   Henry Rogers DOB:Jul 23, 1950  Consent signed: Yes    Surgeon:  [name]  Procedure: Laser Ablation: right Greater Saphenous Vein  BP 126/85 mmHg  Pulse 73  Resp 18  Ht 5\' 6"  (1.676 m)  Wt 184 lb (83.462 kg)  BMI 29.71 kg/m2  Tumescent Anesthesia: 425 cc 0.9% NaCl with 50 cc Lidocaine HCL with 1% Epi and 15 cc 8.4% NaHCO3  Local Anesthesia: 2 cc Lidocaine HCL and NaHCO3 (ratio 2:1)  15 watts continuous mode        Total energy: 2986   Total time: 3:19     Patient tolerated procedure well   Description of Procedure:  After marking the course of the secondary varicosities, the patient was placed on the operating table in the supine position, and the right leg was prepped and draped in sterile fashion.   Local anesthetic was administered and under ultrasound guidance the saphenous vein was accessed with a micro needle and guide wire; then the mirco puncture sheath was placed.  A guide wire was inserted saphenofemoral junction , followed by a 5 french sheath.  The position of the sheath and then the laser fiber below the junction was confirmed using the ultrasound.  Tumescent anesthesia was administered along the course of the saphenous vein using ultrasound guidance. The patient was placed in Trendelenburg position and protective laser glasses were placed on patient and staff, and the laser was fired at 15 watts continuous mode advancing 1-25mm/second for a total of 2986 joules.     Steri strips were applied to the stab wounds and ABD pads and thigh high compression stockings were applied.  Ace wrap bandages were applied over the phlebectomy sites and at the top of the saphenofemoral junction. Blood loss was less than 15 cc.  The patient ambulated out of the operating room having tolerated the procedure well.  Uneventful ablation from proximal calf to just below saphenofemoral junction.

## 2014-10-17 ENCOUNTER — Telehealth: Payer: Self-pay | Admitting: *Deleted

## 2014-10-17 ENCOUNTER — Encounter: Payer: Self-pay | Admitting: Vascular Surgery

## 2014-10-17 NOTE — Telephone Encounter (Signed)
    10/17/2014  Time: 9:07 AM   Patient Name: Henry Rogers  Patient of: T.F. Early  Procedure:Laser Ablation right greater saphenous vein and stab phlebectomy 10-20 incisions (right leg) 10-12-2014  Reached patient at home and checked  His status  Yes    Comments/Actions Taken: Henry Rogers states no right leg pain or swelling.  Reviewed post procedural instructions with him and reminded him of post LA duplex and VV FU with Dr. Arbie Cookey on 10-19-2014.      @SIGNATURE @

## 2014-10-18 ENCOUNTER — Encounter: Payer: Self-pay | Admitting: Vascular Surgery

## 2014-10-19 ENCOUNTER — Encounter: Payer: Self-pay | Admitting: Vascular Surgery

## 2014-10-19 ENCOUNTER — Ambulatory Visit (INDEPENDENT_AMBULATORY_CARE_PROVIDER_SITE_OTHER): Payer: Medicare Other | Admitting: Vascular Surgery

## 2014-10-19 ENCOUNTER — Ambulatory Visit (HOSPITAL_COMMUNITY)
Admission: RE | Admit: 2014-10-19 | Discharge: 2014-10-19 | Disposition: A | Discharge status: Home / Self Care | Payer: Medicare Other | Source: Ambulatory Visit | Attending: Vascular Surgery | Admitting: Vascular Surgery

## 2014-10-19 VITALS — BP 121/81 | HR 78 | Ht 66.0 in | Wt 184.0 lb

## 2014-10-19 DIAGNOSIS — I83891 Varicose veins of right lower extremities with other complications: Secondary | ICD-10-CM | POA: Insufficient documentation

## 2014-10-19 NOTE — Progress Notes (Signed)
Here today for one-week follow-up of laser ablation of right great saphenous vein from knee to saphenofemoral junction. He has done very well following the procedure. He does have mild bruising in mild discomfort. He has been compliant with his compression garments.  On physical exam he does have very mild bruising to the medial aspect of his thigh. No evidence of skin irritation.  Duplex today reveals closure of his great saphenous vein from the area of the knee to 1 cm from the saphenofemoral junction. No evidence of DVT  Impression and plan successful ablation of right great saphenous vein. He does have a varicosities in his posterior calf. Explained that he may require a staged treatment of these depending on his resolution and the degree of discomfort associated with these as well. Explained that Medicare requires this is done as a staged procedure. We will see him again in 3 months for continued discussion of this. He will continue his compression as needed.

## 2014-10-25 ENCOUNTER — Telehealth: Payer: Self-pay | Admitting: *Deleted

## 2014-10-25 NOTE — Telephone Encounter (Signed)
Returning [name]'s earlier call concerning right leg pain.  [name] is s/p endovenous laser ablation right greater saphenous vein by Gretta Began MD on 10-12-2014.  [name] states that the last couple of days he has experienced right thigh pain mid-way between the right groin and knee (area that was ablated) in a localized spot about the size of a silver dollar.  He says it aches after he has been on his legs for most of the day.and he rates his pain level as a "2-3 on a 10 point scale."  Denies right foot or ankle swelling.  Reviewed venous duplex with [name] from 10-19-2014 which showed closure of right GSV and no DVT.  Advised [name] to continue to wear his thigh high compression hose, take Ibuprofen as needed for right leg pain, elevate right leg when possible, use ice compress to localized area of pain as needed.  [name] appears to be reassured and will call [facility] if symptoms worsen or if he has further questions or concerns.

## 2014-11-03 ENCOUNTER — Other Ambulatory Visit: Payer: Self-pay | Admitting: Family Medicine

## 2014-12-28 ENCOUNTER — Encounter: Payer: Self-pay | Admitting: Cardiology

## 2015-01-03 ENCOUNTER — Other Ambulatory Visit: Payer: Self-pay | Admitting: Family Medicine

## 2015-01-03 ENCOUNTER — Ambulatory Visit (INDEPENDENT_AMBULATORY_CARE_PROVIDER_SITE_OTHER): Payer: Medicare Other | Admitting: Family Medicine

## 2015-01-03 ENCOUNTER — Encounter: Payer: Self-pay | Admitting: Family Medicine

## 2015-01-03 VITALS — BP 118/70 | HR 76 | Ht 66.0 in | Wt 194.0 lb

## 2015-01-03 DIAGNOSIS — G5602 Carpal tunnel syndrome, left upper limb: Secondary | ICD-10-CM

## 2015-01-03 DIAGNOSIS — H9193 Unspecified hearing loss, bilateral: Secondary | ICD-10-CM

## 2015-01-03 DIAGNOSIS — Z87891 Personal history of nicotine dependence: Secondary | ICD-10-CM

## 2015-01-03 DIAGNOSIS — G5601 Carpal tunnel syndrome, right upper limb: Secondary | ICD-10-CM

## 2015-01-03 DIAGNOSIS — G4730 Sleep apnea, unspecified: Secondary | ICD-10-CM | POA: Diagnosis not present

## 2015-01-03 DIAGNOSIS — N528 Other male erectile dysfunction: Secondary | ICD-10-CM

## 2015-01-03 DIAGNOSIS — Z125 Encounter for screening for malignant neoplasm of prostate: Secondary | ICD-10-CM

## 2015-01-03 DIAGNOSIS — Z0000 Encounter for general adult medical examination without abnormal findings: Secondary | ICD-10-CM

## 2015-01-03 DIAGNOSIS — E785 Hyperlipidemia, unspecified: Secondary | ICD-10-CM | POA: Diagnosis not present

## 2015-01-03 DIAGNOSIS — I83891 Varicose veins of right lower extremities with other complications: Secondary | ICD-10-CM

## 2015-01-03 DIAGNOSIS — I2510 Atherosclerotic heart disease of native coronary artery without angina pectoris: Secondary | ICD-10-CM

## 2015-01-03 DIAGNOSIS — K219 Gastro-esophageal reflux disease without esophagitis: Secondary | ICD-10-CM | POA: Diagnosis not present

## 2015-01-03 DIAGNOSIS — Z23 Encounter for immunization: Secondary | ICD-10-CM

## 2015-01-03 DIAGNOSIS — Z136 Encounter for screening for cardiovascular disorders: Secondary | ICD-10-CM

## 2015-01-03 DIAGNOSIS — Z1211 Encounter for screening for malignant neoplasm of colon: Secondary | ICD-10-CM

## 2015-01-03 DIAGNOSIS — G5603 Carpal tunnel syndrome, bilateral upper limbs: Secondary | ICD-10-CM

## 2015-01-03 DIAGNOSIS — E8881 Metabolic syndrome: Secondary | ICD-10-CM | POA: Diagnosis not present

## 2015-01-03 DIAGNOSIS — Z122 Encounter for screening for malignant neoplasm of respiratory organs: Secondary | ICD-10-CM

## 2015-01-03 DIAGNOSIS — I2584 Coronary atherosclerosis due to calcified coronary lesion: Secondary | ICD-10-CM

## 2015-01-03 LAB — POCT URINALYSIS DIPSTICK
Bilirubin, UA: NEGATIVE
Blood, UA: NEGATIVE
Glucose, UA: NEGATIVE
Ketones, UA: NEGATIVE
Leukocytes, UA: NEGATIVE
Nitrite, UA: NEGATIVE
Protein, UA: NEGATIVE
Spec Grav, UA: >=1.03
Urobilinogen, UA: NEGATIVE
pH, UA: 6

## 2015-01-03 LAB — COMPREHENSIVE METABOLIC PANEL
ALT: 24 U/L (ref 0–53)
AST: 22 U/L (ref 0–37)
Albumin: 3.9 g/dL (ref 3.5–5.2)
Alkaline Phosphatase: 71 U/L (ref 39–117)
BUN: 19 mg/dL (ref 6–23)
CO2: 24 mEq/L (ref 19–32)
Calcium: 9.4 mg/dL (ref 8.4–10.5)
Chloride: 104 mEq/L (ref 96–112)
Creat: 1.1 mg/dL (ref 0.50–1.35)
Glucose, Bld: 108 mg/dL — ABNORMAL HIGH (ref 70–99)
Potassium: 4.3 mEq/L (ref 3.5–5.3)
Sodium: 138 mEq/L (ref 135–145)
Total Bilirubin: 0.6 mg/dL (ref 0.2–1.2)
Total Protein: 6.5 g/dL (ref 6.0–8.3)

## 2015-01-03 LAB — CBC WITH DIFFERENTIAL/PLATELET
Basophils Absolute: 0 10*3/uL (ref 0.0–0.1)
Basophils Relative: 0 % (ref 0–1)
Eosinophils Absolute: 0.2 10*3/uL (ref 0.0–0.7)
Eosinophils Relative: 3 % (ref 0–5)
HCT: 43.4 % (ref 39.0–52.0)
Hemoglobin: 14.6 g/dL (ref 13.0–17.0)
Lymphocytes Relative: 26 % (ref 12–46)
Lymphs Abs: 1.6 10*3/uL (ref 0.7–4.0)
MCH: 31.1 pg (ref 26.0–34.0)
MCHC: 33.6 g/dL (ref 30.0–36.0)
MCV: 92.3 fL (ref 78.0–100.0)
MPV: 9.8 fL (ref 8.6–12.4)
Monocytes Absolute: 0.6 10*3/uL (ref 0.1–1.0)
Monocytes Relative: 9 % (ref 3–12)
Neutro Abs: 3.9 10*3/uL (ref 1.7–7.7)
Neutrophils Relative %: 62 % (ref 43–77)
Platelets: 218 10*3/uL (ref 150–400)
RBC: 4.7 MIL/uL (ref 4.22–5.81)
RDW: 14.2 % (ref 11.5–15.5)
WBC: 6.3 10*3/uL (ref 4.0–10.5)

## 2015-01-03 LAB — LIPID PANEL
Cholesterol: 115 mg/dL (ref 0–200)
HDL: 37 mg/dL — ABNORMAL LOW (ref >=40)
LDL Cholesterol: 51 mg/dL (ref 0–99)
Total CHOL/HDL Ratio: 3.1 Ratio
Triglycerides: 134 mg/dL (ref <150)
VLDL: 27 mg/dL (ref 0–40)

## 2015-01-03 MED ORDER — ATORVASTATIN CALCIUM 80 MG PO TABS
80.0000 mg | ORAL_TABLET | Freq: Every day | ORAL | Status: DC
Start: 2015-01-03 — End: 2016-01-27

## 2015-01-03 NOTE — Progress Notes (Signed)
Subjective:    Patient ID: Henry Rogers, male    DOB: [birth year], 64 y.o.   MRN: [id number]  HPI He is here for complete examination. He does see his cardiologist regularly and does have a previous history of MI. Apparently with his next visit, they will stop one of his cardiac medications. He has had no difficulty with chest pain, shortness of breath, PND or DOE. He does have a previous history of sleep apnea but has not had reevaluation of this in several years. He does snore but usually tries to sleep on either side. He does have difficulty at night with bilateral hand pain. He has had previous evaluation of this in the past which did show evidence of CTS. He has had injections as well as using night splints but continues to have difficulty with that. He does have underlying reflux disease but had this under good control. He does have underlying ED but sexual activity has been minimal.He continues on Lipitor and is having no difficulty with this. He continues to use his hearing aids. He does complain of aching sensation in both hips but it does not bother him when he walks. His immunizations were updated as well as health maintenance. Family and social history was reviewed.  Review of Systems  All other systems reviewed and are negative.      Objective:   Physical Exam BP 118/70 mmHg  Pulse 76  Ht  (1.676 m)  Wt 194 lb (87.998 kg)  BMI 31.33 kg/m2  SpO2 99%  General Appearance:    Alert, cooperative, no distress, appears stated age  Head:    Normocephalic, without obvious abnormality, atraumatic  Eyes:    PERRL, conjunctiva/corneas clear, EOM's intact, fundi    benign  Ears:    Normal TM's and external ear canals  Nose:   Nares normal, mucosa normal, no drainage or sinus   tenderness  Throat:   Lips, mucosa, and tongue normal; teeth and gums normal  Neck:   Supple, no lymphadenopathy;  thyroid:  no   enlargement/tenderness/nodules; no carotid   bruit or JVD  Back:    Spine  nontender, no curvature, ROM normal, no CVA     Tenderness.No tenderness over SI joints. Hip motion is normal.  Lungs:     Clear to auscultation bilaterally without wheezes, rales or     ronchi; respirations unlabored  Chest Wall:    No tenderness or deformity   Heart:    Regular rate and rhythm, S1 and S2 normal, no murmur, rub   or gallop  Breast Exam:    No chest wall tenderness, masses or gynecomastia  Abdomen:     Soft, non-tender, nondistended, normoactive bowel sounds,    no masses, no hepatosplenomegaly        Extremities:   No clubbing, cyanosis or edema  Pulses:   2+ and symmetric all extremities  Skin:   Skin color, texture, turgor normal, no rashes or lesions  Lymph nodes:   Cervical, supraclavicular, and axillary nodes normal  Neurologic:   CNII-XII intact, normal strength, sensation and gait; reflexes 2+ and symmetric throughout          Psych:   Normal mood, affect, hygiene and grooming.          Assessment & Plan:  Routine general medical examination at a health care facility - Plan: POCT Urinalysis Dipstick, US Aorta Initial Medicare Screen, CBC with Differential/Platelet, Comprehensive metabolic panel, Lipid panel  Sleep apnea - Plan:  Nocturnal polysomnography  Other male erectile dysfunction  Gastroesophageal reflux disease without esophagitis  Coronary artery disease due to calcified coronary lesion  Hearing loss, bilateral  Hyperlipidemia LDL goal <70 - Plan: atorvastatin (LIPITOR) 80 MG tablet, Lipid panel  Metabolic syndrome  Need for prophylactic vaccination against Streptococcus pneumoniae (pneumococcus)  Bilateral carpal tunnel syndrome - Plan: Nerve conduction test  Screening for AAA (abdominal aortic aneurysm)  Former smoker - Plan: US Aorta Initial Medicare Screen  Varicose veins of leg with complications, right  Special screening for malignant neoplasms, colon - Plan: Ambulatory referral to Gastroenterology  Screening for prostate cancer  - Plan: PSA, Medicare Instructed him on stretching exercises for his back. We also discussed the ED problem however sexual activity has been quite limited over the last year or 2. Both he and his wife seem to be doing okay with this. Shared Decision Making Visit Lung Cancer Screening Program [phone])   Eligibility:  Age 80 y.o.  Pack Years Smoking History Calculation 30 (# packs/per year x # years smoked)  Recent History of coughing up blood  no  Unexplained weight loss? no ( >Than 15 pounds within the last 6 months )  Prior History Lung / other cancer no (Diagnosis within the last 5 years already requiring surveillance chest CT Scans).  Smoking Status Former Smoker  Former Smokers: Years since quit: 5 years  Quit Date: 2011  Visit Components:  Discussion included one or more decision making aids. no  Discussion included risk/benefits of screening. yes  Discussion included potential follow up diagnostic testing for abnormal scans. yes  Discussion included meaning and risk of over diagnosis. yes  Discussion included meaning and risk of False Positives. yes  Discussion included meaning of total radiation exposure. yes  Counseling Included:  Importance of adherence to annual lung cancer LDCT screening. yes  Impact of comorbidities on ability to participate in the program. yes  Ability and willingness to under diagnostic treatment. yes  Smoking Cessation Counseling:  Current Smokers:   Discussed importance of smoking cessation. no  Information about tobacco cessation classes and interventions provided to patient. no  Patient provided with "ticket" for LDCT Scan. no  Symptomatic Patient. no  Counseling(Intermediate counseling: > three minutes) [id number]  Diagnosis Code: Tobacco Use Z72.0  Asymptomatic Patient yes  Counseling (Intermediate counseling: > three minutes counseling) [id number]  Former Smokers:   Discussed the importance of maintaining cigarette  abstinence. no  Diagnosis Code: Personal History of Nicotine Dependence. U82.800  Information about tobacco cessation classes and interventions provided to patient. No  Patient provided with "ticket" for LDCT Scan. no  Written Order for Lung Cancer Screening with LDCT placed in Epic. Yes (CT Chest Lung Cancer Screening Low Dose W/O CM) [id number] Z12.2-Screening of respiratory organs Z87.891-Personal history of nicotine dependence   Carollee Herter, MD

## 2015-01-03 NOTE — Patient Instructions (Signed)
Do knee to chest exercises and also rotational types exercise

## 2015-01-04 LAB — PSA: PSA: 0.9 ng/mL (ref <=4.00)

## 2015-01-04 LAB — HEMOGLOBIN A1C
Hgb A1c MFr Bld: 5.8 % — ABNORMAL HIGH (ref <5.7)
Mean Plasma Glucose: 120 mg/dL — ABNORMAL HIGH (ref <117)

## 2015-01-12 ENCOUNTER — Encounter: Payer: Self-pay | Admitting: Vascular Surgery

## 2015-01-12 ENCOUNTER — Ambulatory Visit (HOSPITAL_COMMUNITY): Payer: Medicare Other

## 2015-01-15 ENCOUNTER — Ambulatory Visit (INDEPENDENT_AMBULATORY_CARE_PROVIDER_SITE_OTHER): Payer: Medicare Other | Admitting: Family Medicine

## 2015-01-15 ENCOUNTER — Ambulatory Visit (HOSPITAL_COMMUNITY)
Admission: RE | Admit: 2015-01-15 | Discharge: 2015-01-15 | Disposition: A | Discharge status: Home / Self Care | Payer: Medicare Other | Source: Ambulatory Visit | Attending: Family Medicine | Admitting: Family Medicine

## 2015-01-15 ENCOUNTER — Telehealth: Payer: Self-pay | Admitting: Family Medicine

## 2015-01-15 ENCOUNTER — Encounter: Payer: Self-pay | Admitting: Family Medicine

## 2015-01-15 VITALS — BP 118/64 | HR 76 | Temp 97.8°F | Wt 188.4 lb

## 2015-01-15 DIAGNOSIS — J029 Acute pharyngitis, unspecified: Secondary | ICD-10-CM

## 2015-01-15 DIAGNOSIS — Z87891 Personal history of nicotine dependence: Secondary | ICD-10-CM | POA: Insufficient documentation

## 2015-01-15 DIAGNOSIS — Z0000 Encounter for general adult medical examination without abnormal findings: Secondary | ICD-10-CM | POA: Insufficient documentation

## 2015-01-15 DIAGNOSIS — Z136 Encounter for screening for cardiovascular disorders: Secondary | ICD-10-CM | POA: Insufficient documentation

## 2015-01-15 LAB — POCT RAPID STREP A (OFFICE): Rapid Strep A Screen: NEGATIVE

## 2015-01-15 IMAGING — US US AORTA SCREENING (MEDICARE)
1 series · 13 of 13 positions shown · non-contrast
Comparison: None.

CLINICAL DATA: Medicare screening exam for abdominal aortic
aneurysm.

EXAM:
ABDOMINAL AORTA SCREENING ULTRASOUND
TECHNIQUE: Ultrasound examination of the abdominal aorta was performed as a
screening evaluation for abdominal aortic aneurysm.

[Series 1: us aorta screening (medicare) · 0.27mm/px · 13 of 13 slices shown]
[im 1/13]
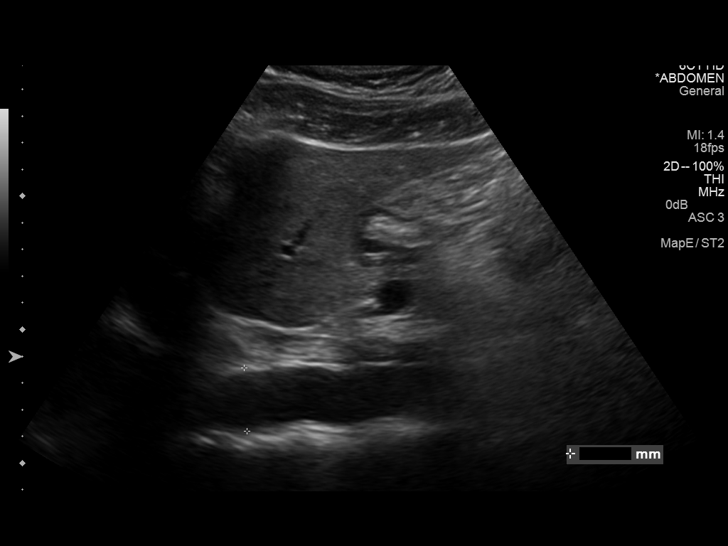
[im 2/13]
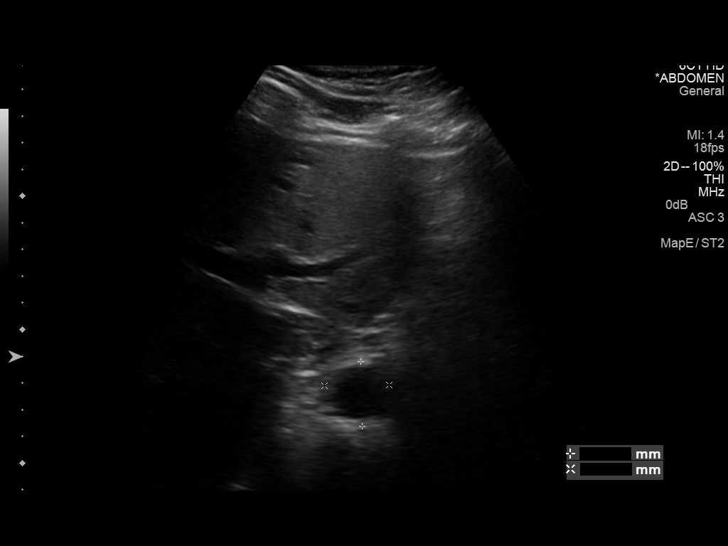
[im 3/13]
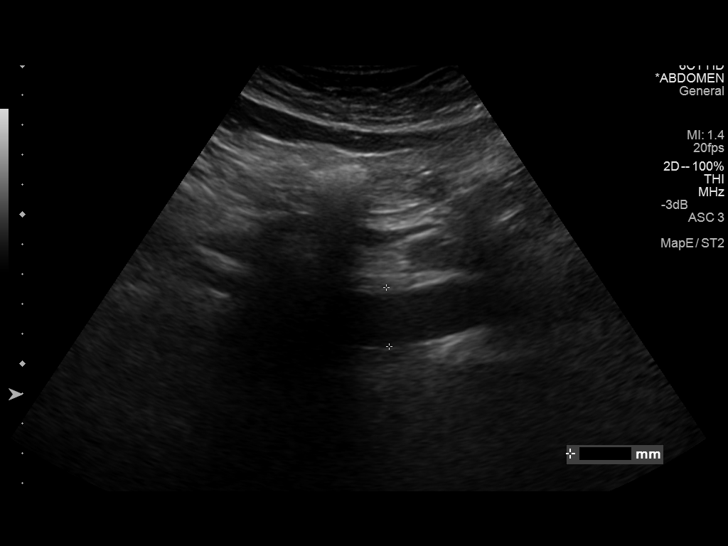
[im 4/13]
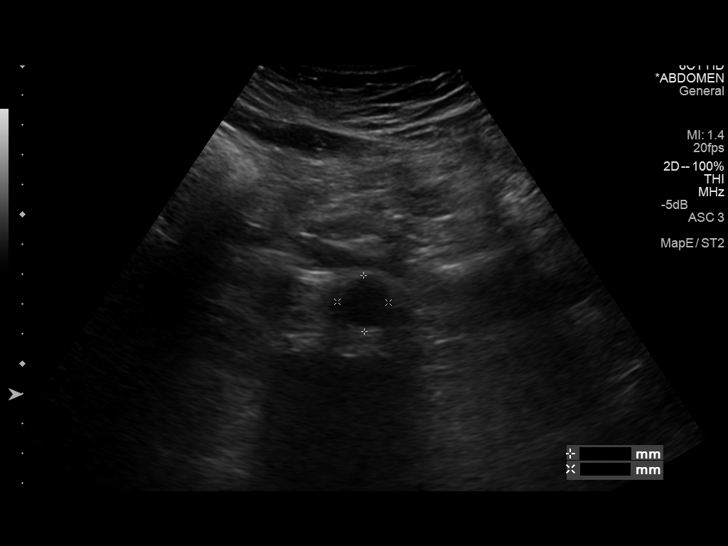
[im 5/13]
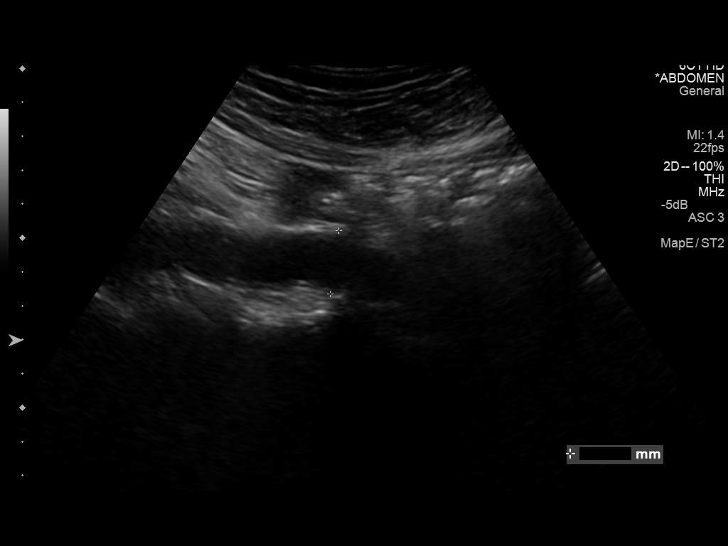
[im 6/13]
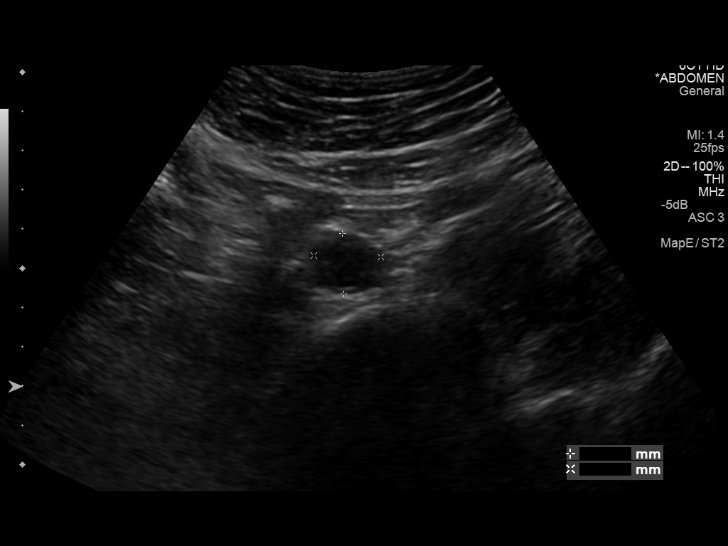
[im 7/13]
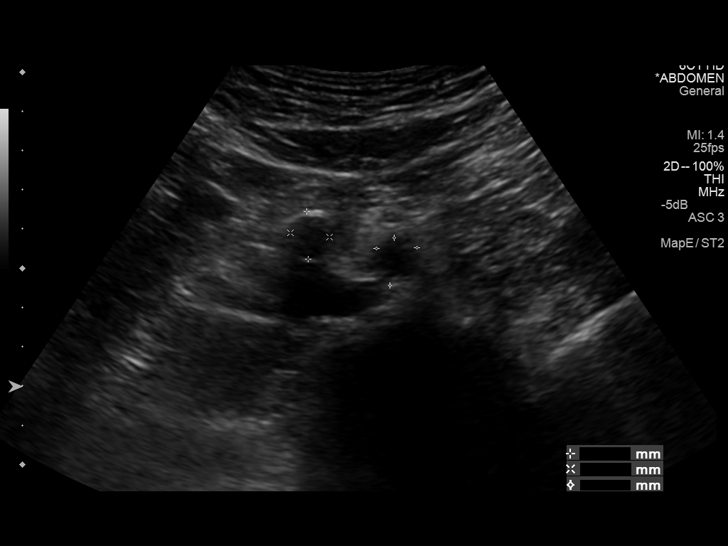
[im 8/13]
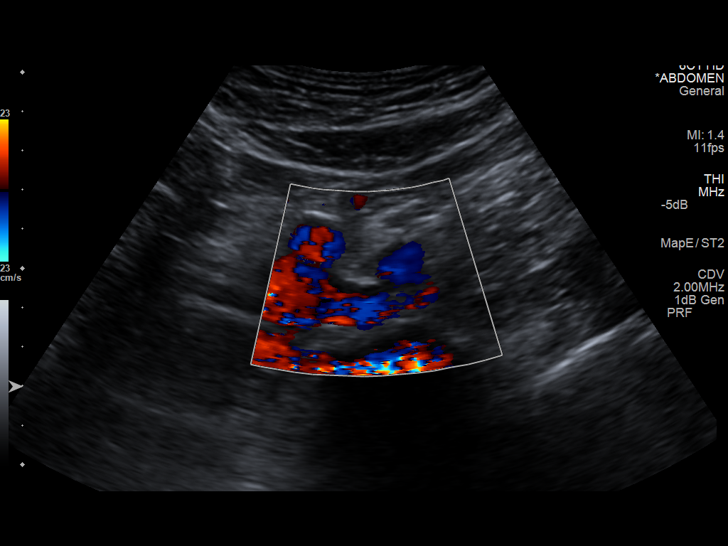
[im 9/13]
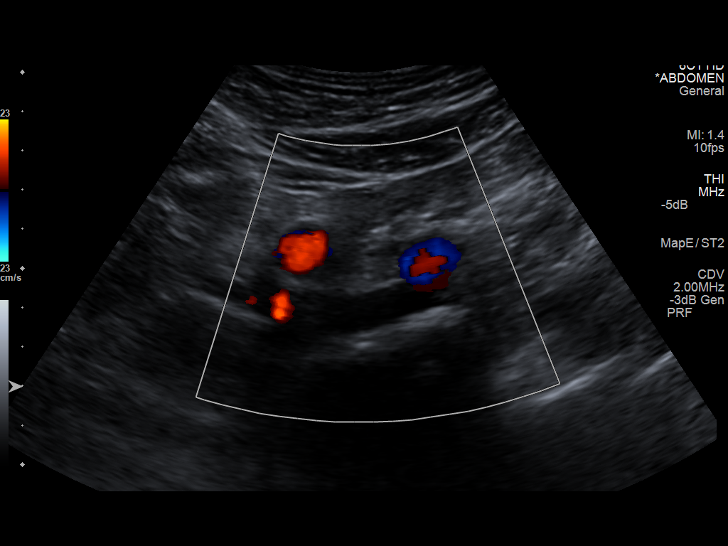
[im 10/13]
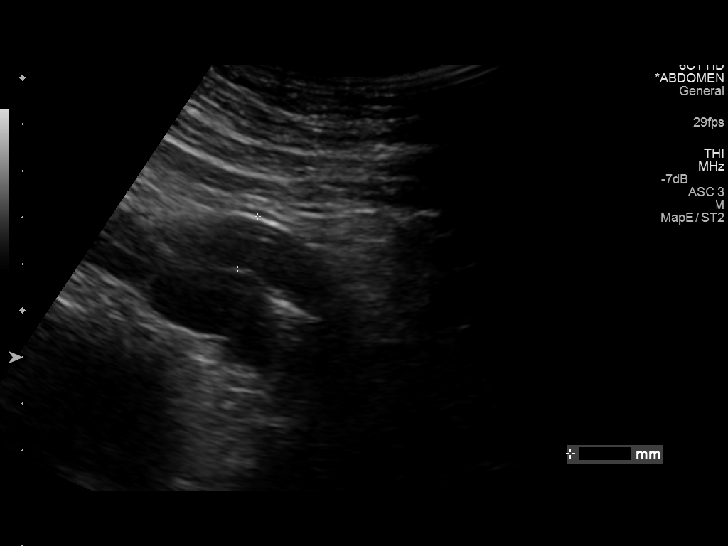
[im 11/13]
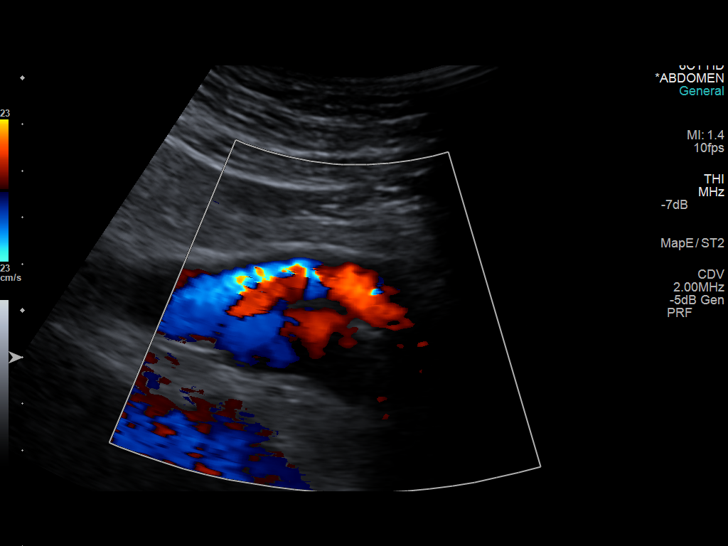
[im 12/13]
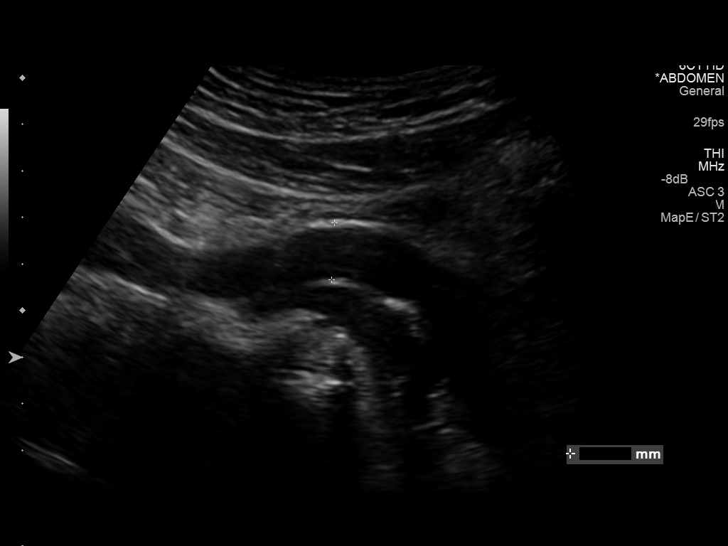
[im 13/13]
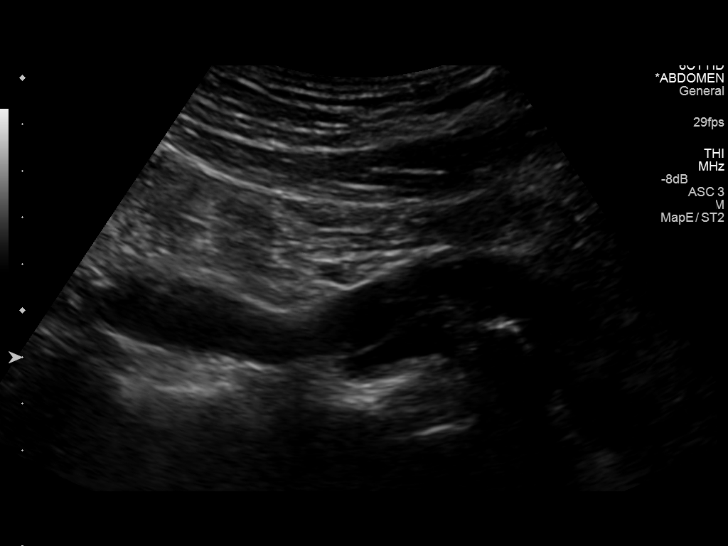

[13 of 13 positions shown; findings below may reference images not displayed]

FINDINGS: Abdominal Aorta

No aneurysm identified.

Maximum AP

Diameter:  2.4 cm

Maximum TRV

Diameter: 2.4 cm

The common iliac arteries are normal in caliber.
IMPRESSION: Normal abdominal aortic ultrasound.

## 2015-01-15 NOTE — Telephone Encounter (Signed)
Have him come in for an evaluation

## 2015-01-15 NOTE — Telephone Encounter (Signed)
Pt still has sore throat. He had a sore throat when he was here for his CPE on 01/03/15 and his wife was dx'd with strep so pt wants to know if he should come in for a strep test or let Dr Susann Givens check throat again or should Dr Susann Givens call in meds. Pt can be reached on cell phone

## 2015-01-15 NOTE — Telephone Encounter (Signed)
Pt has appointment 

## 2015-01-15 NOTE — Progress Notes (Signed)
   Subjective:    Patient ID: Henry Rogers, male    DOB: Dec 17, 1950, 64 y.o.   MRN: [id number]  HPI He complains of a two-week history of intermittent sore throat but no fever, chills, cough or congestion.He is helping take care of his 12-month-old grandson. Apparently other people in the family have been having difficulty with sore throat.   Review of Systems     Objective:   Physical Exam Alert and in no distress. Tympanic membranes and canals are normal. Pharyngeal area is normal. Neck is supple without adenopathy or thyromegaly. Cardiac exam shows a regular sinus rhythm without murmurs or gallops. Lungs are clear to auscultation. Strep screen is negative      Assessment & Plan:  Sore throat - Plan: POCT rapid strep A This is most likely viral and supportive care is appropriate.

## 2015-01-16 ENCOUNTER — Encounter: Payer: Self-pay | Admitting: Vascular Surgery

## 2015-01-16 ENCOUNTER — Ambulatory Visit (INDEPENDENT_AMBULATORY_CARE_PROVIDER_SITE_OTHER): Payer: Medicare Other | Admitting: Vascular Surgery

## 2015-01-16 VITALS — BP 106/74 | HR 73 | Resp 18 | Ht 66.5 in | Wt 189.4 lb

## 2015-01-16 DIAGNOSIS — I83891 Varicose veins of right lower extremities with other complications: Secondary | ICD-10-CM

## 2015-01-16 NOTE — Progress Notes (Signed)
Patient is here today for follow-up of venous hypertension in his right leg. He is status post laser ablation of great saphenous vein on 10/12/2014. He reports that he has had decreased discomfort in his right leg following the procedure. He continues to have pain over his large tributary varicosities to his medial thigh medial and posterior calf extending down to his ankle.  I did image his saphenous vein with SonoSite ultrasound this does show closure of the saphenous vein. He does have multiple lower extremity or varicosities arising from this. He also has multiple transient telangiectasia around the medial ankle.  Impression and plan: Improvement after ablation of right great saphenous vein. Continues to have discomfort related to his multiple large tributary varicosities. I have recommended the option of stab phlebectomy of these tributaries for further reduction of his venous hypertension and improvement of pain. He understands this as an outpatient procedure under local anesthesia. This would be greater than 20 stab phlebectomy treatment. He wishes to proceed as soon as possible

## 2015-01-18 ENCOUNTER — Ambulatory Visit
Admission: RE | Admit: 2015-01-18 | Discharge: 2015-01-18 | Disposition: A | Discharge status: Home / Self Care | Payer: Medicare Other | Source: Ambulatory Visit | Attending: Family Medicine | Admitting: Family Medicine

## 2015-01-18 ENCOUNTER — Other Ambulatory Visit: Payer: Self-pay | Admitting: Family Medicine

## 2015-01-18 DIAGNOSIS — Z87891 Personal history of nicotine dependence: Secondary | ICD-10-CM

## 2015-01-18 DIAGNOSIS — Z122 Encounter for screening for malignant neoplasm of respiratory organs: Secondary | ICD-10-CM

## 2015-01-23 ENCOUNTER — Encounter: Payer: Self-pay | Admitting: Family Medicine

## 2015-02-26 ENCOUNTER — Other Ambulatory Visit: Payer: Self-pay | Admitting: Orthopedic Surgery

## 2015-02-27 ENCOUNTER — Encounter (HOSPITAL_BASED_OUTPATIENT_CLINIC_OR_DEPARTMENT_OTHER): Payer: Self-pay | Admitting: *Deleted

## 2015-02-27 ENCOUNTER — Telehealth: Payer: Self-pay | Admitting: Cardiology

## 2015-02-27 NOTE — Telephone Encounter (Signed)
Spoke with jodi, the pt is going to have carpal tunnel surgery Friday 03-02-15 and they are asking for clearance. Will forward for dr jordan's review

## 2015-02-27 NOTE — Telephone Encounter (Signed)
Patient called no answer.LMTC. 

## 2015-02-27 NOTE — Progress Notes (Signed)
EKG 8/15  BMET 01/03/15.  Dr Swaziland called and card clearance requested for sugery this Friday.

## 2015-02-27 NOTE — Telephone Encounter (Signed)
He is clear for carpel tunnel surgery.   Margareta Laureano Sw[place]and MD, [facility]

## 2015-02-28 NOTE — Telephone Encounter (Signed)
Received call from patient Dr.Jordan cleared you to have carpel tunnel surgery 03/02/15.  Note faxed to Lennox Laity at [facility] at fax # [phone].

## 2015-03-02 ENCOUNTER — Encounter (HOSPITAL_BASED_OUTPATIENT_CLINIC_OR_DEPARTMENT_OTHER)
Admission: RE | Disposition: A | Discharge status: Home / Self Care | Payer: Self-pay | Source: Ambulatory Visit | Attending: Orthopedic Surgery

## 2015-03-02 ENCOUNTER — Ambulatory Visit (HOSPITAL_BASED_OUTPATIENT_CLINIC_OR_DEPARTMENT_OTHER): Payer: Medicare Other | Admitting: Anesthesiology

## 2015-03-02 ENCOUNTER — Ambulatory Visit (HOSPITAL_BASED_OUTPATIENT_CLINIC_OR_DEPARTMENT_OTHER)
Admission: RE | Admit: 2015-03-02 | Discharge: 2015-03-02 | Disposition: A | Discharge status: Home / Self Care | Payer: Medicare Other | Source: Ambulatory Visit | Attending: Orthopedic Surgery | Admitting: Orthopedic Surgery

## 2015-03-02 ENCOUNTER — Encounter (HOSPITAL_BASED_OUTPATIENT_CLINIC_OR_DEPARTMENT_OTHER): Payer: Self-pay | Admitting: *Deleted

## 2015-03-02 DIAGNOSIS — M19031 Primary osteoarthritis, right wrist: Secondary | ICD-10-CM | POA: Insufficient documentation

## 2015-03-02 DIAGNOSIS — Z79899 Other long term (current) drug therapy: Secondary | ICD-10-CM | POA: Diagnosis not present

## 2015-03-02 DIAGNOSIS — Z87891 Personal history of nicotine dependence: Secondary | ICD-10-CM | POA: Insufficient documentation

## 2015-03-02 DIAGNOSIS — I2510 Atherosclerotic heart disease of native coronary artery without angina pectoris: Secondary | ICD-10-CM | POA: Diagnosis not present

## 2015-03-02 DIAGNOSIS — I255 Ischemic cardiomyopathy: Secondary | ICD-10-CM | POA: Diagnosis not present

## 2015-03-02 DIAGNOSIS — M19032 Primary osteoarthritis, left wrist: Secondary | ICD-10-CM | POA: Diagnosis not present

## 2015-03-02 DIAGNOSIS — G4733 Obstructive sleep apnea (adult) (pediatric): Secondary | ICD-10-CM | POA: Insufficient documentation

## 2015-03-02 DIAGNOSIS — H9190 Unspecified hearing loss, unspecified ear: Secondary | ICD-10-CM | POA: Insufficient documentation

## 2015-03-02 DIAGNOSIS — G5601 Carpal tunnel syndrome, right upper limb: Secondary | ICD-10-CM | POA: Diagnosis not present

## 2015-03-02 DIAGNOSIS — I252 Old myocardial infarction: Secondary | ICD-10-CM | POA: Diagnosis not present

## 2015-03-02 DIAGNOSIS — G5602 Carpal tunnel syndrome, left upper limb: Secondary | ICD-10-CM | POA: Diagnosis present

## 2015-03-02 DIAGNOSIS — Z7982 Long term (current) use of aspirin: Secondary | ICD-10-CM | POA: Diagnosis not present

## 2015-03-02 DIAGNOSIS — K219 Gastro-esophageal reflux disease without esophagitis: Secondary | ICD-10-CM | POA: Insufficient documentation

## 2015-03-02 HISTORY — PX: CARPAL TUNNEL RELEASE: SHX101

## 2015-03-02 LAB — POCT I-STAT, CHEM 8
BUN: 22 mg/dL — ABNORMAL HIGH (ref 6–20)
Calcium, Ion: 1.27 mmol/L (ref 1.13–1.30)
Chloride: 100 mmol/L — ABNORMAL LOW (ref 101–111)
Creatinine, Ser: 1.1 mg/dL (ref 0.61–1.24)
Glucose, Bld: 100 mg/dL — ABNORMAL HIGH (ref 65–99)
HCT: 49 % (ref 39.0–52.0)
Hemoglobin: 16.7 g/dL (ref 13.0–17.0)
Potassium: 4.1 mmol/L (ref 3.5–5.1)
Sodium: 139 mmol/L (ref 135–145)
TCO2: 25 mmol/L (ref 0–100)

## 2015-03-02 SURGERY — CARPAL TUNNEL RELEASE
Anesthesia: General | Site: Hand | Laterality: Bilateral

## 2015-03-02 MED ORDER — BUPIVACAINE HCL (PF) 0.25 % IJ SOLN
INTRAMUSCULAR | Status: AC
  Filled 2015-03-02: qty 30

## 2015-03-02 MED ORDER — CHLORHEXIDINE GLUCONATE 4 % EX LIQD: 60.0000 mL | Freq: Once | CUTANEOUS | Status: DC

## 2015-03-02 MED ORDER — KETOROLAC TROMETHAMINE 30 MG/ML IJ SOLN
INTRAMUSCULAR | Status: DC | PRN
  Administered 2015-03-02: 30 mg via INTRAVENOUS

## 2015-03-02 MED ORDER — GLYCOPYRROLATE 0.2 MG/ML IJ SOLN: 0.2000 mg | Freq: Once | INTRAMUSCULAR | Status: DC | PRN

## 2015-03-02 MED ORDER — FENTANYL CITRATE (PF) 100 MCG/2ML IJ SOLN
50.0000 ug | INTRAMUSCULAR | Status: AC | PRN
  Administered 2015-03-02: 25 ug via INTRAVENOUS
  Administered 2015-03-02: 50 ug via INTRAVENOUS
  Administered 2015-03-02: 25 ug via INTRAVENOUS

## 2015-03-02 MED ORDER — CEFAZOLIN SODIUM-DEXTROSE 2-3 GM-% IV SOLR: 2.0000 g | INTRAVENOUS | Status: DC

## 2015-03-02 MED ORDER — HYDROCODONE-ACETAMINOPHEN 5-325 MG PO TABS: 1.0000 | ORAL_TABLET | Freq: Four times a day (QID) | ORAL | Status: DC | PRN

## 2015-03-02 MED ORDER — PROPOFOL 10 MG/ML IV BOLUS
INTRAVENOUS | Status: DC | PRN
  Administered 2015-03-02: 180 mg via INTRAVENOUS

## 2015-03-02 MED ORDER — LIDOCAINE HCL (PF) 1 % IJ SOLN
INTRAMUSCULAR | Status: AC
  Filled 2015-03-02: qty 5

## 2015-03-02 MED ORDER — DEXAMETHASONE SODIUM PHOSPHATE 4 MG/ML IJ SOLN
INTRAMUSCULAR | Status: DC | PRN
  Administered 2015-03-02: 10 mg via INTRAVENOUS

## 2015-03-02 MED ORDER — BUPIVACAINE HCL (PF) 0.25 % IJ SOLN
INTRAMUSCULAR | Status: DC | PRN
  Administered 2015-03-02: 10 mL

## 2015-03-02 MED ORDER — LACTATED RINGERS IV SOLN
INTRAVENOUS | Status: DC
  Administered 2015-03-02: 13:00:00 via INTRAVENOUS

## 2015-03-02 MED ORDER — EPHEDRINE SULFATE 50 MG/ML IJ SOLN
INTRAMUSCULAR | Status: DC | PRN
  Administered 2015-03-02 (×3): 10 mg via INTRAVENOUS

## 2015-03-02 MED ORDER — MIDAZOLAM HCL 2 MG/2ML IJ SOLN
INTRAMUSCULAR | Status: AC
  Filled 2015-03-02: qty 2

## 2015-03-02 MED ORDER — CEFAZOLIN SODIUM-DEXTROSE 2-3 GM-% IV SOLR
INTRAVENOUS | Status: AC
  Filled 2015-03-02: qty 50

## 2015-03-02 MED ORDER — FENTANYL CITRATE (PF) 100 MCG/2ML IJ SOLN
INTRAMUSCULAR | Status: AC
  Filled 2015-03-02: qty 10

## 2015-03-02 MED ORDER — PROPOFOL 500 MG/50ML IV EMUL
INTRAVENOUS | Status: AC
  Filled 2015-03-02: qty 50

## 2015-03-02 MED ORDER — MIDAZOLAM HCL 2 MG/2ML IJ SOLN
1.0000 mg | INTRAMUSCULAR | Status: DC | PRN
  Administered 2015-03-02: 2 mg via INTRAVENOUS

## 2015-03-02 MED ORDER — CEFAZOLIN SODIUM-DEXTROSE 2-3 GM-% IV SOLR
2.0000 g | INTRAVENOUS | Status: DC
  Administered 2015-03-02: 2 g via INTRAVENOUS

## 2015-03-02 MED ORDER — ONDANSETRON HCL 4 MG/2ML IJ SOLN
INTRAMUSCULAR | Status: DC | PRN
  Administered 2015-03-02: 4 mg via INTRAVENOUS

## 2015-03-02 MED ORDER — LIDOCAINE HCL (CARDIAC) 20 MG/ML IV SOLN
INTRAVENOUS | Status: DC | PRN
  Administered 2015-03-02: 50 mg via INTRAVENOUS

## 2015-03-02 MED ORDER — SCOPOLAMINE 1 MG/3DAYS TD PT72: 1.0000 | MEDICATED_PATCH | Freq: Once | TRANSDERMAL | Status: DC | PRN

## 2015-03-02 SURGICAL SUPPLY — 42 items
BLADE SURG 15 STRL LF DISP TIS (BLADE) ×1 IMPLANT
BLADE SURG 15 STRL SS (BLADE) ×1
BNDG COHESIVE 3X5 TAN STRL LF (GAUZE/BANDAGES/DRESSINGS) ×4 IMPLANT
BNDG ESMARK 4X9 LF (GAUZE/BANDAGES/DRESSINGS) ×2 IMPLANT
BNDG GAUZE ELAST 4 BULKY (GAUZE/BANDAGES/DRESSINGS) ×4 IMPLANT
CHLORAPREP W/TINT 26ML (MISCELLANEOUS) ×4 IMPLANT
CORDS BIPOLAR (ELECTRODE) ×2 IMPLANT
COVER BACK TABLE 60X90IN (DRAPES) ×2 IMPLANT
COVER MAYO STAND STRL (DRAPES) ×4 IMPLANT
CUFF TOURNIQUET SINGLE 18IN (TOURNIQUET CUFF) ×4 IMPLANT
DERMABOND ADVANCED (GAUZE/BANDAGES/DRESSINGS)
DERMABOND ADVANCED .7 DNX12 (GAUZE/BANDAGES/DRESSINGS) IMPLANT
DRAPE EXTREMITY T 121X128X90 (DRAPE) ×4 IMPLANT
DRAPE SURG 17X23 STRL (DRAPES) ×4 IMPLANT
DRSG PAD ABDOMINAL 8X10 ST (GAUZE/BANDAGES/DRESSINGS) ×4 IMPLANT
GAUZE SPONGE 4X4 12PLY STRL (GAUZE/BANDAGES/DRESSINGS) ×2 IMPLANT
GAUZE XEROFORM 1X8 LF (GAUZE/BANDAGES/DRESSINGS) ×2 IMPLANT
GLOVE BIOGEL PI IND STRL 7.0 (GLOVE) ×1 IMPLANT
GLOVE BIOGEL PI IND STRL 8.5 (GLOVE) ×1 IMPLANT
GLOVE BIOGEL PI INDICATOR 7.0 (GLOVE) ×1
GLOVE BIOGEL PI INDICATOR 8.5 (GLOVE) ×1
GLOVE ECLIPSE 6.5 STRL STRAW (GLOVE) ×2 IMPLANT
GLOVE EXAM NITRILE EXT CUFF MD (GLOVE) ×2 IMPLANT
GLOVE SURG ORTHO 8.0 STRL STRW (GLOVE) ×2 IMPLANT
GOWN STRL REUS W/ TWL LRG LVL3 (GOWN DISPOSABLE) ×1 IMPLANT
GOWN STRL REUS W/TWL LRG LVL3 (GOWN DISPOSABLE) ×1
GOWN STRL REUS W/TWL XL LVL3 (GOWN DISPOSABLE) ×2 IMPLANT
LIQUID BAND (GAUZE/BANDAGES/DRESSINGS) ×4 IMPLANT
NEEDLE PRECISIONGLIDE 27X1.5 (NEEDLE) ×2 IMPLANT
NS IRRIG 1000ML POUR BTL (IV SOLUTION) ×2 IMPLANT
PACK BASIN DAY SURGERY FS (CUSTOM PROCEDURE TRAY) ×2 IMPLANT
PAD ALCOHOL SWAB (MISCELLANEOUS) ×4 IMPLANT
PADDING CAST ABS 4INX4YD NS (CAST SUPPLIES)
PADDING CAST ABS COTTON 4X4 ST (CAST SUPPLIES) IMPLANT
STOCKINETTE 4X48 STRL (DRAPES) ×4 IMPLANT
SUT ETHILON 4 0 PS 2 18 (SUTURE) ×2 IMPLANT
SUT MON AB 4-0 PC3 18 (SUTURE) ×4 IMPLANT
SUT VICRYL 4-0 PS2 18IN ABS (SUTURE) IMPLANT
SYR BULB 3OZ (MISCELLANEOUS) ×2 IMPLANT
SYR CONTROL 10ML LL (SYRINGE) ×2 IMPLANT
TOWEL OR 17X24 6PK STRL BLUE (TOWEL DISPOSABLE) ×4 IMPLANT
UNDERPAD 30X30 (UNDERPADS AND DIAPERS) ×4 IMPLANT

## 2015-03-02 NOTE — Anesthesia Postprocedure Evaluation (Signed)
  Anesthesia Post-op Note  Patient: Henry Rogers  Procedure(s) Performed: Procedure(s): BILATERAL CARPAL TUNNEL RELEASE  (Bilateral)  Patient Location: PACU  Anesthesia Type:General  Level of Consciousness: awake, alert  and oriented  Airway and Oxygen Therapy: Patient Spontanous Breathing  Post-op Pain: none  Post-op Assessment: Post-op Vital signs reviewed, Patient's Cardiovascular Status Stable, Respiratory Function Stable, Patent Airway and Pain level controlled              Post-op Vital Signs: stable  Last Vitals:  Filed Vitals:   03/02/15 1615  BP: 121/75  Pulse: 80  Temp: 36.5 C  Resp: 20    Complications: No apparent anesthesia complications

## 2015-03-02 NOTE — Brief Op Note (Signed)
03/02/2015  3:18 PM  PATIENT:  [name]  64 y.o. male  PRE-OPERATIVE DIAGNOSIS:  BILATERAL CARPAL TUNNEL  POST-OPERATIVE DIAGNOSIS:  BILATERAL CARPAL TUNNEL  PROCEDURE:  Procedure(s): BILATERAL CARPAL TUNNEL RELEASE  (Bilateral)  SURGEON:  Surgeon(s) and Role:    * Cindee Salt, MD - Primary  PHYSICIAN ASSISTANT:   ASSISTANTS: none   ANESTHESIA:   local and regional  EBL:  Total I/O In: 1300 [I.V.:1300] Out: -   BLOOD ADMINISTERED:none  DRAINS: none   LOCAL MEDICATIONS USED:  BUPIVICAINE   SPECIMEN:  No Specimen  DISPOSITION OF SPECIMEN:  N/A  COUNTS:  YES  TOURNIQUET:   Total Tourniquet Time Documented: Forearm (Left) - 21 minutes Forearm (Left) - 21 minutes Total: Forearm (Left) - 42 minutes   DICTATION: .Other Dictation: Dictation Number [id number]  PLAN OF CARE: Discharge to home after PACU  PATIENT DISPOSITION:  PACU - hemodynamically stable.

## 2015-03-02 NOTE — H&P (Signed)
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 Susann Givens for consultation with respect to bilateral hand pain, numbness and tingling.  This has been going on for a number of years, increasing over the past six months to the point it has now become constant. He is awakened 7/7 nights, has no history of injury to the hand or to the neck.  He had steroid injections 18 months ago which helped. He has been wearing a brace. He has no history of diabetes, thyroid problems, arthritis or gout, driving produces pain for him.  He had nerve conductions performed in 2014 revealing bilateral carpal tunnel syndrome with motor delay of 6.5/right, 5.7/left. He complains of a constant, moderate to severe, throbbing, aching, burning type pain with a feeling of numbness and tingling on a relatively constant basis to all fingers.   NERVE STUDIES:   He has had his nerve conductions done revealing bilateral carpal tunnel syndrome with a motor delay of 6.9/left  and 7.7/right;  sensory delay of 3.9/left and 4.0/right;  amplitude diminution to 8/left and 9.7/right.    ALLERGIES:     None.  MEDICATIONS:     Lipitor, metoprolol, Flomax, Prilosec, aspirin, vitamins.  SURGICAL HISTORY:    Stent placement (02/02/14).  FAMILY MEDICAL HISTORY:   Positive for high blood pressure.   SOCIAL HISTORY:    He does not smoke quitting 5 years ago.  He drinks socially.  He is retired.   REVIEW OF SYSTEMS:    Positive for glasses, hearing loss, heart attack, otherwise negative 14 points.  Henry Rogers is an 64 y.o. male.   Chief Complaint: numbness both hands HPI: see above  Past Medical History  Diagnosis Date  . Hemorrhoids   . Tinnitus   . Hearing loss   . HH (hiatus hernia)   . GERD (gastroesophageal reflux disease)   . OSA (obstructive sleep apnea)     a. mild not requiring CPAP at this time  . CAD (coronary artery disease)     a. inf STEMI s/p BMS to mRCA (02/03/14)  .  Ischemic cardiomyopathy     a. ECHO 01/2014 EF 45-50% and akinesis of the entire inferior myocardium. Mild MR.   [name] STEMI (ST elevation myocardial infarction) 02/02/14    BMS to RCA  . Varicosities of leg   . Varicose veins     Past Surgical History  Procedure Laterality Date  . Colonoscopy  2006    Dr. Elnoria Howard  . Coronary angioplasty with stent placement  02/02/14    BMS to RCA  . Left heart catheterization with coronary angiogram N/A 02/02/2014    Procedure: LEFT HEART CATHETERIZATION WITH CORONARY ANGIOGRAM;  Surgeon: Peter M Swaziland, MD;  Location: [facility] CATH LAB;  Service: Cardiovascular;  Laterality: N/A;  . Endovenous ablation saphenous vein w/ laser Right 10-12-2014    EVLA right greater saphenous vein by Gretta Began MD    Family History  Problem Relation Age of Onset  . Hypertension Mother   . Cancer Maternal Grandmother    Social History:  reports that he has quit smoking. He has never used smokeless tobacco. He reports that he drinks about 1.8 oz of alcohol per week. He reports that he does not use illicit drugs.  Allergies: No Known Allergies  Medications Prior to Admission  Medication Sig Dispense Refill  . aspirin 81 MG tablet Take 81 mg by mouth daily.    [name] atorvastatin (LIPITOR) 80 MG  tablet Take 1 tablet (80 mg total) by mouth daily at 6 PM. 90 tablet 3  . Melatonin 10 MG CAPS Take 10 mg by mouth at bedtime as needed (for sleep).     . metoprolol tartrate (LOPRESSOR) 12.5 mg TABS tablet Take 12.5 mg (half tablet) twice daily 60 tablet 11  . Nutritional Supplements (JUICE PLUS FIBRE PO) Take 1 tablet by mouth 2 (two) times daily. Fruit blend    . omeprazole (PRILOSEC) 40 MG capsule Take 1 capsule (40 mg total) by mouth daily. 90 capsule 3  . ranitidine (ZANTAC) 75 MG tablet Take 75 mg by mouth at bedtime as needed for heartburn.    . tamsulosin (FLOMAX) 0.4 MG CAPS capsule TAKE 2 CAPSULES BY MOUTH DAILY 180 capsule 1  . Multiple Vitamin (MULTIVITAMIN) tablet Take 1 tablet by  mouth daily.    . nitroGLYCERIN (NITROSTAT) 0.4 MG SL tablet Place 1 tablet (0.4 mg total) under the tongue every 5 (five) minutes x 3 doses as needed for chest pain. 25 tablet 12    Results for orders placed or performed during the hospital encounter of 03/02/15 (from the past 48 hour(s))  I-STAT, chem 8     Status: Abnormal   Collection Time: 03/02/15  1:07 PM  Result Value Ref Range   Sodium 139 135 - 145 mmol/L   Potassium 4.1 3.5 - 5.1 mmol/L   Chloride 100 (L) 101 - 111 mmol/L   BUN 22 (H) 6 - 20 mg/dL   Creatinine, Ser 7.42 0.61 - 1.24 mg/dL   Glucose, Bld 595 (H) 65 - 99 mg/dL   Calcium, Ion 6.38 7.56 - 1.30 mmol/L   TCO2 25 0 - 100 mmol/L   Hemoglobin 16.7 13.0 - 17.0 g/dL   HCT 43.3 29.5 - 18.8 %    No results found.   Pertinent items are noted in HPI.  Blood pressure 110/78, pulse 70, temperature 97.7 F (36.5 C), temperature source Oral, resp. rate 16, height  (1.676 m), weight 83.462 kg (184 lb), SpO2 99 %.  General appearance: alert, cooperative and appears stated age Head: Normocephalic, without obvious abnormality Neck: no JVD Resp: clear to auscultation bilaterally Cardio: regular rate and rhythm, S1, S2 normal, no murmur, click, rub or gallop GI: soft, non-tender; bowel sounds normal; no masses,  no organomegaly Extremities: numbnees bilateral hands Pulses: 2+ and symmetric Skin: Skin color, texture, turgor normal. No rashes or lesions Neurologic: Grossly normal Incision/Wound: na  Assessment/Plan RADIOGRAPHS:    Bilateral CMC arthritis.  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    PLAN:  We have discussed possibility of surgical decompression of the median nerve. The pre, peri and postoperative course were discussed along with the risks and complications.  The patient is aware there is no guarantee with the surgery, possibility of infection, recurrence, injury to arteries, nerves, tendons, incomplete relief  of symptoms and dystrophy.  He would like to proceed.    He is scheduled for bilateral carpal tunnel release as an outpatient under regional anesthesia.  Emillie Chasen R 03/02/2015, 2:00 PM

## 2015-03-02 NOTE — Anesthesia Preprocedure Evaluation (Signed)
Anesthesia Evaluation  Patient identified by MRN, date of birth, ID band Patient awake    Reviewed: Allergy & Precautions, NPO status   Airway Mallampati: II  TM Distance: >3 FB Neck ROM: Full    Dental  (+) Teeth Intact, Dental Advisory Given   Pulmonary former smoker,  breath sounds clear to auscultation        Cardiovascular Rhythm:Regular Rate:Normal     Neuro/Psych    GI/Hepatic   Endo/Other    Renal/GU      Musculoskeletal   Abdominal   Peds  Hematology   Anesthesia Other Findings   Reproductive/Obstetrics                             Anesthesia Physical Anesthesia Plan  ASA: III  Anesthesia Plan: General   Post-op Pain Management:    Induction: Intravenous  Airway Management Planned: LMA  Additional Equipment:   Intra-op Plan:   Post-operative Plan:   Informed Consent: I have reviewed the patients History and Physical, chart, labs and discussed the procedure including the risks, benefits and alternatives for the proposed anesthesia with the patient or authorized representative who has indicated his/her understanding and acceptance.   Dental advisory given  Plan Discussed with: CRNA and Anesthesiologist  Anesthesia Plan Comments:         Anesthesia Quick Evaluation

## 2015-03-02 NOTE — Anesthesia Procedure Notes (Signed)
Procedure Name: LMA Insertion Performed by: York Grice Pre-anesthesia Checklist: Patient identified, Emergency Drugs available, Timeout performed, Suction available and Patient being monitored Patient Re-evaluated:Patient Re-evaluated prior to inductionOxygen Delivery Method: Circle system utilized Preoxygenation: Pre-oxygenation with 100% oxygen Intubation Type: IV induction Ventilation: Mask ventilation without difficulty LMA: LMA inserted LMA Size: 4.0 Tube type: Oral Number of attempts: 1 Placement Confirmation: positive ETCO2 and breath sounds checked- equal and bilateral Tube secured with: Tape Dental Injury: Teeth and Oropharynx as per pre-operative assessment

## 2015-03-02 NOTE — Op Note (Signed)
Dictation Number [id number]

## 2015-03-02 NOTE — Discharge Instructions (Addendum)
[facility] Instructions Hand Surgery  Wound Care: Keep your hand elevated above the level of your heart.  Do not allow it to dangle by your side.  Keep the dressing dry and do not remove it unless your doctor advises you to do so.  He will usually change it at the time of your post-op visit.  Moving your fingers is advised to stimulate circulation but will depend on the site of your surgery.  If you have a splint applied, your doctor will advise you regarding movement.  Activity: Do not drive or operate machinery today.  Rest today and then you may return to your normal activity and work as indicated by your physician.  Diet:  Drink liquids today or eat a light diet.  You may resume a regular diet tomorrow.    General expectations: Pain for two to three days. Fingers may become slightly swollen.  Call your doctor if any of the following occur: Severe pain not relieved by pain medication. Elevated temperature. Dressing soaked with blood. Inability to move fingers. White or bluish color to fingers Pt may take off dressing tomorrow and apply waterproof bandaid and begin using hands.    Post Anesthesia Home Care Instructions  Activity: Get plenty of rest for the remainder of the day. A responsible adult should stay with you for 24 hours following the procedure.  For the next 24 hours, DO NOT: -Drive a car -Advertising copywriter -Drink alcoholic beverages -Take any medication unless instructed by your physician -Make any legal decisions or sign important papers.  Meals: Start with liquid foods such as gelatin or soup. Progress to regular foods as tolerated. Avoid greasy, spicy, heavy foods. If nausea and/or vomiting occur, drink only clear liquids until the nausea and/or vomiting subsides. Call your physician if vomiting continues.  Special Instructions/Symptoms: Your throat may feel dry or sore from the anesthesia or the breathing tube placed in your throat during surgery. If this  causes discomfort, gargle with warm salt water. The discomfort should disappear within 24 hours.  If you had a scopolamine patch placed behind your ear for the management of post- operative nausea and/or vomiting:  1. The medication in the patch is effective for 72 hours, after which it should be removed.  Wrap patch in a tissue and discard in the trash. Wash hands thoroughly with soap and water. 2. You may remove the patch earlier than 72 hours if you experience unpleasant side effects which may include dry mouth, dizziness or visual disturbances. 3. Avoid touching the patch. Wash your hands with soap and water after contact with the patch.

## 2015-03-02 NOTE — Transfer of Care (Signed)
Immediate Anesthesia Transfer of Care Note  Patient: Henry Rogers  Procedure(s) Performed: Procedure(s): BILATERAL CARPAL TUNNEL RELEASE  (Bilateral)  Patient Location: PACU  Anesthesia Type:General  Level of Consciousness: awake and sedated  Airway & Oxygen Therapy: Patient Spontanous Breathing and Patient connected to face mask oxygen  Post-op Assessment: Report given to RN and Post -op Vital signs reviewed and stable  Post vital signs: Reviewed and stable  Last Vitals:  Filed Vitals:   03/02/15 1255  BP: 110/78  Pulse: 70  Temp: 36.5 C  Resp: 16    Complications: No apparent anesthesia complications

## 2015-03-05 ENCOUNTER — Encounter (HOSPITAL_BASED_OUTPATIENT_CLINIC_OR_DEPARTMENT_OTHER): Payer: Self-pay | Admitting: Orthopedic Surgery

## 2015-03-05 NOTE — Op Note (Signed)
NAMEKENYADA, HOMEIER NO.:  [id number]  MEDICAL RECORD NO.:  [id number]  LOCATION:                               FACILITY:  [facility]  PHYSICIAN:  Cindee Salt, M.D.       DATE OF BIRTH:  Aug 13, 1950  DATE OF PROCEDURE:  03/02/2015 DATE OF DISCHARGE:  03/02/2015                              OPERATIVE REPORT   PREOPERATIVE DIAGNOSIS:  Bilateral carpal tunnel syndrome.  POSTOPERATIVE DIAGNOSIS:  Bilateral carpal tunnel syndrome.  OPERATION:  Release of left median nerve and release of the right median nerve at the wrist.  SURGEON:  Cindee Salt, M.D.  ANESTHESIA:  General.  ANESTHESIOLOGIST:  Guadalupe Maple, M.D.  HISTORY:  The patient is a 64 year old male with a history of bilateral carpal tunnel syndrome.  Nerve conductions are markedly positive.  He is offered conservative treatment, has declined.  He is requested to proceed with carpal tunnel release.  He would also like to have both sides done at the same time if possible.  Pre, peri, and postoperative course have been discussed along with risks and complications.  He is aware there is no guarantee with the surgery; possibility of infection; recurrence of injury to arteries, nerves, tendons; incomplete relief of symptoms; dystrophy.  In the preoperative area, the patient is seen, the extremity is marked by both patient and surgeon.  Antibiotic given.  PROCEDURE IN DETAIL:  The patient was brought to the operating room, where a general anesthetic was carried out without difficulty.  He was prepped using ChloraPrep, supine position, both arms free.  The left side was attended to first.  A longitudinal incision made in the left palm, carried down through the subcutaneous tissue.  Bleeders were electrocauterized.  Palmar fascia was split.  Superficial palmar arch identified.  Flexor tendon to the ring and little finger identified to the ulnar side of median nerve.  Carpal retinaculum was incised with sharp  dissection.  Right angle and Sewall retractor were placed between skin and forearm fascia.  The fascia released for approximately 2 cm proximal to the wrist crease under direct vision.  Canal was explored. Air compression to the nerve was apparent.  Motor branch entered in the muscle.  No further lesions were identified.  The wound was irrigated. The area was injected with 0.25% bupivacaine without epinephrine, 5 mL was used.  The skin was then repaired with subcuticular 4-0 Monocryl suture.  __________ skin glue was then applied.  A sterile compressive dressing with fingers free was applied.  Following the hardening of the Dermabond, the tourniquet deflated, all fingers immediately pinked.  The right side was then attended to.  A  longitudinal incision made in the palm.  Each side had been exsanguinated with an Esmarch bandage.  After time-out was taken confirming the patient and procedure on each side, the tourniquet inflated to 250 mmHg.  This was done on the left side. The longitudinal incision was made in the right palm, carried down through subcutaneous tissue.  Bleeders again electrocauterized.  Palmar fascia was split.  Superficial palmar arch identified.  The flexor tendon to the ring and little finger identified to the ulnar side of the  median nerve.  The carpal retinaculum was incised with sharp dissection. Right angle and Sewall retractor were placed between skin and forearm fascia.  Fascia was released for approximately 2 cm proximal to the wrist crease under direct vision and then the nerve was explored.  Motor branch entered into muscle.  Air compression to the nerve was apparent. The wound was irrigated and then the area injected with 0.25% bupivacaine without epinephrine and the skin closed with subcuticular 4- 0 Monocryl suture.  __________ Dermabond was then placed.  After this hardened, a sterile compressive dressing with the fingers free was applied.  On deflation of  the tourniquet, all fingers immediately pinked.  He was taken to the recovery room for observation in satisfactory condition.  He will be discharged home to return to the [facility] in 1 week, on Vicodin.          ______________________________ Cindee Salt, M.D.     GK/MEDQ  D:  03/02/2015  T:  03/03/2015  Job:  [id number]

## 2015-03-14 ENCOUNTER — Encounter: Payer: Self-pay | Admitting: Vascular Surgery

## 2015-03-15 ENCOUNTER — Encounter: Payer: Self-pay | Admitting: Vascular Surgery

## 2015-03-15 ENCOUNTER — Ambulatory Visit (INDEPENDENT_AMBULATORY_CARE_PROVIDER_SITE_OTHER): Payer: Medicare Other | Admitting: Vascular Surgery

## 2015-03-15 VITALS — BP 116/72 | HR 72 | Temp 97.1°F | Resp 18 | Ht 66.5 in | Wt 189.4 lb

## 2015-03-15 DIAGNOSIS — I83891 Varicose veins of right lower extremities with other complications: Secondary | ICD-10-CM

## 2015-03-15 HISTORY — PX: OTHER SURGICAL HISTORY: SHX169

## 2015-03-15 NOTE — Progress Notes (Signed)
    Stab Phlebectomy Procedure  Henry Rogers DOB:09-26-1950  03/15/2015  Consent signed: Yes  Surgeon:T.F. Karan Ramnauth  Procedure: stab phlebectomy: right leg  BP 116/72 mmHg  Pulse 72  Temp(Src) 97.1 F (36.2 C) (Oral)  Resp 18  Ht 5' 6.5" (1.689 m)  Wt 189 lb 6.4 oz (85.911 kg)  BMI 30.12 kg/m2  SpO2 96%  Start time: 10:45 am    End time: 11:30am   Tumescent Anesthesia: 375 cc 0.9% NaCl with 50 cc Lidocaine HCL with 1% Epi and 15 cc 8.4% NaHCO3  Local Anesthesia: 1.5 cc Lidocaine HCL and NaHCO3 (ratio 2:1)    Stab Phlebectomy: >20 incisions Sites: Thigh and Calf  Patient tolerated procedure well: Yes    Description of Procedure:  After marking the course of the secondary varicosities, the patient was placed on the operating table in the supine position, and the right leg was prepped and draped in sterile fashion.    The patient was then put into Trendelenburg position.  Local anesthetic was administered at the previously marked varicosities, and tumescent anesthesia was administered around the vessels.  Greater than 20 stab wounds were made using the tip of an 11 blade. And using the vein hook, the phlebectomies were performed using a hemostat to avulse the varicosities.  Adequate hemostasis was achieved, and steri strips were applied to the stab wound.      ABD pads and thigh high compression stockings were applied as well ace wraps where needed. Blood loss was less than 15 cc.  The patient ambulated out of the operating room having tolerated the procedure well.

## 2015-03-16 ENCOUNTER — Encounter: Payer: Self-pay | Admitting: Vascular Surgery

## 2015-03-27 ENCOUNTER — Other Ambulatory Visit: Payer: Self-pay | Admitting: Physician Assistant

## 2015-04-10 ENCOUNTER — Telehealth: Payer: Self-pay | Admitting: Family Medicine

## 2015-04-10 NOTE — Telephone Encounter (Signed)
Please call  States he has 2 quick questions for you. He is leaving to go out of town at noon today.

## 2015-04-10 NOTE — Telephone Encounter (Signed)
Set up GI for GERD

## 2015-04-11 ENCOUNTER — Other Ambulatory Visit: Payer: Self-pay

## 2015-04-11 DIAGNOSIS — K219 Gastro-esophageal reflux disease without esophagitis: Secondary | ICD-10-CM

## 2015-04-11 NOTE — Telephone Encounter (Signed)
ORDER IN EPIC.

## 2015-04-11 NOTE — Telephone Encounter (Signed)
[name] 

## 2015-04-12 ENCOUNTER — Ambulatory Visit: Payer: Medicare Other | Admitting: Vascular Surgery

## 2015-04-17 ENCOUNTER — Encounter: Payer: Self-pay | Admitting: Vascular Surgery

## 2015-04-19 ENCOUNTER — Ambulatory Visit (INDEPENDENT_AMBULATORY_CARE_PROVIDER_SITE_OTHER): Payer: Medicare Other | Admitting: Vascular Surgery

## 2015-04-19 ENCOUNTER — Encounter: Payer: Self-pay | Admitting: Gastroenterology

## 2015-04-19 ENCOUNTER — Encounter: Payer: Self-pay | Admitting: Vascular Surgery

## 2015-04-19 ENCOUNTER — Ambulatory Visit (INDEPENDENT_AMBULATORY_CARE_PROVIDER_SITE_OTHER): Payer: Medicare Other | Admitting: Gastroenterology

## 2015-04-19 VITALS — BP 112/80 | HR 80 | Ht 65.75 in | Wt 188.4 lb

## 2015-04-19 VITALS — BP 122/83 | HR 96 | Temp 97.2°F | Resp 16 | Ht 66.0 in | Wt 188.0 lb

## 2015-04-19 DIAGNOSIS — K219 Gastro-esophageal reflux disease without esophagitis: Secondary | ICD-10-CM | POA: Diagnosis not present

## 2015-04-19 DIAGNOSIS — I83891 Varicose veins of right lower extremities with other complications: Secondary | ICD-10-CM

## 2015-04-19 NOTE — Progress Notes (Signed)
HPI: This is a  very pleasant 64 year old man who was referred to me by Ronnald Nian, MD  to evaluate  GERD .    Chief complaint is chronic GERD   Had a colonoscopy with Dr. Elnoria Howard this month for routine screening. We don't have those results here for that.  This was second one with [facility].    Was having issues with swallowing, pyrosis.  Was told he had hiatal hernia based on imaging 15 years ago.  Was having pyrosis, acid regurg.  IN the past 6 months the regurg has awoken him from sleep.  Waterbrash. Has happened 8-10 times in past 6 months.  Currently on prilosec 40 every morning, usually with breakfast.  Takes zantac once every 2-3 weeks; will take pending his evening meals.  No dysphagia.  Overall weight has been stable.  Drinks 1-2 drinks per day (beer or wine), quit smoking 5 years.  Not much peppermint or chocalate.    Review of systems: Pertinent positive and negative review of systems were noted in the above HPI section. Complete review of systems was performed and was otherwise normal.   Past Medical History  Diagnosis Date  . Hemorrhoids   . Tinnitus   . Hearing loss   . HH (hiatus hernia)   . GERD (gastroesophageal reflux disease)   . OSA (obstructive sleep apnea)     a. mild not requiring CPAP at this time  . CAD (coronary artery disease)     a. inf STEMI s/p BMS to mRCA (02/03/14)  . Ischemic cardiomyopathy     a. ECHO 01/2014 EF 45-50% and akinesis of the entire inferior myocardium. Mild MR.   [name] STEMI (ST elevation myocardial infarction) 02/02/14    BMS to RCA  . Varicosities of leg   . Varicose veins     Past Surgical History  Procedure Laterality Date  . Colonoscopy  2006    Dr. Elnoria Howard  . Coronary angioplasty with stent placement  02/02/14    BMS to RCA  . Left heart catheterization with coronary angiogram N/A 02/02/2014    Procedure: LEFT HEART CATHETERIZATION WITH CORONARY ANGIOGRAM;  Surgeon: Peter M Swaziland, MD;  Location: [facility] CATH LAB;  Service:  Cardiovascular;  Laterality: N/A;  . Endovenous ablation saphenous vein w/ laser Right 10-12-2014    EVLA right greater saphenous vein by Gretta Began MD  . Carpal tunnel release Bilateral 03/02/2015    Procedure: BILATERAL CARPAL TUNNEL RELEASE ;  Surgeon: Cindee Salt, MD;  Location: [facility];  Service: Orthopedics;  Laterality: Bilateral;  . Stab phlebectomy  Right 03-15-2015    stab phlebectomy > 20 incisions (right leg) by Gretta Began MD    Current Outpatient Prescriptions  Medication Sig Dispense Refill  . aspirin 81 MG tablet Take 81 mg by mouth daily.    [name] atorvastatin (LIPITOR) 80 MG tablet Take 1 tablet (80 mg total) by mouth daily at 6 PM. 90 tablet 3  . Melatonin 10 MG CAPS Take 10 mg by mouth at bedtime as needed (for sleep).     . metoprolol tartrate (LOPRESSOR) 12.5 mg TABS tablet Take 12.5 mg (half tablet) twice daily 60 tablet 11  . metoprolol tartrate (LOPRESSOR) 25 MG tablet TAKE 1/2 TABLET BY MOUTH TWICE DAILY 30 tablet 0  . Multiple Vitamin (MULTIVITAMIN) tablet Take 1 tablet by mouth daily.    . nitroGLYCERIN (NITROSTAT) 0.4 MG SL tablet Place 1 tablet (0.4 mg total) under the tongue every 5 (five) minutes x  3 doses as needed for chest pain. 25 tablet 12  . Nutritional Supplements (JUICE PLUS FIBRE PO) Take 1 tablet by mouth 2 (two) times daily. Fruit blend    . omeprazole (PRILOSEC) 40 MG capsule Take 1 capsule (40 mg total) by mouth daily. 90 capsule 3  . ranitidine (ZANTAC) 75 MG tablet Take 75 mg by mouth at bedtime as needed for heartburn.    . tamsulosin (FLOMAX) 0.4 MG CAPS capsule TAKE 2 CAPSULES BY MOUTH DAILY 180 capsule 1   No current facility-administered medications for this visit.    Allergies as of 04/19/2015  . (No Known Allergies)    Family History  Problem Relation Age of Onset  . Hypertension Mother   . Cancer Maternal Grandmother     Social History   Social History  . Marital Status: Married    Spouse Name: N/A  . Number of  Children: N/A  . Years of Education: N/A   Occupational History  . Not on file.   Social History Main Topics  . Smoking status: Former Games developer  . Smokeless tobacco: Never Used  . Alcohol Use: 1.8 oz/week    3 Glasses of wine per week  . Drug Use: No  . Sexual Activity: Not Currently   Other Topics Concern  . Not on file   Social History Narrative     Physical Exam: BP 112/80 mmHg  Pulse 80  Ht 5' 5.75" (1.67 m)  Wt 188 lb 6.4 oz (85.458 kg)  BMI 30.64 kg/m2 Constitutional: generally well-appearing Psychiatric: alert and oriented x3 Eyes: extraocular movements intact Mouth: oral pharynx moist, no lesions Neck: supple no lymphadenopathy Cardiovascular: heart regular rate and rhythm Lungs: clear to auscultation bilaterally Abdomen: soft, nontender, nondistended, no obvious ascites, no peritoneal signs, normal bowel sounds Extremities: no lower extremity edema bilaterally Skin: no lesions on visible extremities   Assessment and plan: 64 y.o. male with  chronic GERD  He is especially bothered by nocturnal GERD symptoms. I am adjusting the way he is taking his antiacid medicines. He will begin taking his proton pump inhibitor shortly before a meal instead of with a meal or after it. He will begin taking his H2 blocker at bedtime every night instead of just on a when necessary basis. I recommended we screen for Barrett's given that he is Caucasian, older than 35, male, obese. We will therefore schedule him for upper endoscopy at his soonest convenience.   Rob Bunting, MD  Gastroenterology 04/19/2015, 1:50 PM  Cc: Ronnald Nian, MD

## 2015-04-19 NOTE — Progress Notes (Signed)
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 It is quite well with the procedure and is recovering nicely. Does have some mild ongoing soreness at the phlebectomy sites and does have an area just above his ankle where there is some blood either in the tunnel for the phlebectomy or clots in a superficial vein. Explained that this will resolve completely over the next several weeks to month.  He is pleased with his result will see [place] again on as-needed basis

## 2015-04-19 NOTE — Patient Instructions (Addendum)
We will get records sent from your previous gastroenterologist for review (Dr. Elnoria Howard colonoscopy recently and in past also).  This will include any endoscopic (colonoscopy or upper endoscopy) procedures and any associated pathology reports.   You should change the way you are taking your antiacid medicine (prilosec) so that you are taking it 20-30 minutes prior to a decent meal as that is the way the pill is designed to work most effectively. Start taking zantac at bedtime every night, not just as needed. You will be set up for an upper endoscopy for Barrett's screening.

## 2015-04-24 ENCOUNTER — Other Ambulatory Visit: Payer: Self-pay | Admitting: Cardiology

## 2015-04-25 ENCOUNTER — Encounter: Payer: Self-pay | Admitting: Family Medicine

## 2015-04-26 ENCOUNTER — Other Ambulatory Visit: Payer: Self-pay | Admitting: Family Medicine

## 2015-05-01 ENCOUNTER — Telehealth: Payer: Self-pay | Admitting: Gastroenterology

## 2015-05-01 NOTE — Telephone Encounter (Signed)
Reviewed outside records  Colonoscopy Dr. Elnoria Howard 03/2015 for screening: findings 72mm ascending colon polyp, sigmoid diverticulosis. Polyp was "unremarkable mucosa" on path, not precancerous polyp.    He needs recall colonoscopy 03/2025.

## 2015-05-16 ENCOUNTER — Inpatient Hospital Stay (HOSPITAL_COMMUNITY)
Admission: AD | Admit: 2015-05-16 | Discharge: 2015-05-21 | DRG: 270 | Disposition: A | Discharge status: Home / Self Care | Payer: Medicare Other | Source: Other Acute Inpatient Hospital | Attending: Interventional Cardiology | Admitting: Interventional Cardiology

## 2015-05-16 ENCOUNTER — Encounter (HOSPITAL_COMMUNITY)
Admission: AD | Disposition: A | Discharge status: Home / Self Care | Payer: Medicare Other | Attending: Interventional Cardiology

## 2015-05-16 ENCOUNTER — Inpatient Hospital Stay (HOSPITAL_COMMUNITY): Payer: Medicare Other

## 2015-05-16 DIAGNOSIS — J069 Acute upper respiratory infection, unspecified: Secondary | ICD-10-CM | POA: Diagnosis present

## 2015-05-16 DIAGNOSIS — G4733 Obstructive sleep apnea (adult) (pediatric): Secondary | ICD-10-CM | POA: Diagnosis present

## 2015-05-16 DIAGNOSIS — R079 Chest pain, unspecified: Secondary | ICD-10-CM | POA: Diagnosis present

## 2015-05-16 DIAGNOSIS — I25110 Atherosclerotic heart disease of native coronary artery with unstable angina pectoris: Secondary | ICD-10-CM

## 2015-05-16 DIAGNOSIS — H9190 Unspecified hearing loss, unspecified ear: Secondary | ICD-10-CM | POA: Diagnosis present

## 2015-05-16 DIAGNOSIS — I213 ST elevation (STEMI) myocardial infarction of unspecified site: Secondary | ICD-10-CM | POA: Diagnosis present

## 2015-05-16 DIAGNOSIS — R570 Cardiogenic shock: Secondary | ICD-10-CM | POA: Diagnosis present

## 2015-05-16 DIAGNOSIS — Z7982 Long term (current) use of aspirin: Secondary | ICD-10-CM

## 2015-05-16 DIAGNOSIS — I2510 Atherosclerotic heart disease of native coronary artery without angina pectoris: Secondary | ICD-10-CM | POA: Diagnosis present

## 2015-05-16 DIAGNOSIS — I509 Heart failure, unspecified: Secondary | ICD-10-CM

## 2015-05-16 DIAGNOSIS — R05 Cough: Secondary | ICD-10-CM

## 2015-05-16 DIAGNOSIS — I5032 Chronic diastolic (congestive) heart failure: Secondary | ICD-10-CM | POA: Diagnosis present

## 2015-05-16 DIAGNOSIS — R059 Cough, unspecified: Secondary | ICD-10-CM

## 2015-05-16 DIAGNOSIS — N529 Male erectile dysfunction, unspecified: Secondary | ICD-10-CM | POA: Diagnosis present

## 2015-05-16 DIAGNOSIS — Z955 Presence of coronary angioplasty implant and graft: Secondary | ICD-10-CM | POA: Diagnosis not present

## 2015-05-16 DIAGNOSIS — Z79899 Other long term (current) drug therapy: Secondary | ICD-10-CM | POA: Diagnosis not present

## 2015-05-16 DIAGNOSIS — T508X5A Adverse effect of diagnostic agents, initial encounter: Secondary | ICD-10-CM | POA: Diagnosis not present

## 2015-05-16 DIAGNOSIS — N170 Acute kidney failure with tubular necrosis: Secondary | ICD-10-CM | POA: Diagnosis not present

## 2015-05-16 DIAGNOSIS — Z87891 Personal history of nicotine dependence: Secondary | ICD-10-CM

## 2015-05-16 DIAGNOSIS — R0989 Other specified symptoms and signs involving the circulatory and respiratory systems: Secondary | ICD-10-CM | POA: Diagnosis not present

## 2015-05-16 DIAGNOSIS — I255 Ischemic cardiomyopathy: Secondary | ICD-10-CM | POA: Diagnosis present

## 2015-05-16 DIAGNOSIS — I2111 ST elevation (STEMI) myocardial infarction involving right coronary artery: Secondary | ICD-10-CM | POA: Diagnosis not present

## 2015-05-16 DIAGNOSIS — I5033 Acute on chronic diastolic (congestive) heart failure: Secondary | ICD-10-CM | POA: Diagnosis not present

## 2015-05-16 DIAGNOSIS — I480 Paroxysmal atrial fibrillation: Secondary | ICD-10-CM | POA: Diagnosis present

## 2015-05-16 DIAGNOSIS — I2119 ST elevation (STEMI) myocardial infarction involving other coronary artery of inferior wall: Principal | ICD-10-CM | POA: Diagnosis present

## 2015-05-16 DIAGNOSIS — N179 Acute kidney failure, unspecified: Secondary | ICD-10-CM

## 2015-05-16 DIAGNOSIS — E876 Hypokalemia: Secondary | ICD-10-CM | POA: Diagnosis not present

## 2015-05-16 DIAGNOSIS — I252 Old myocardial infarction: Secondary | ICD-10-CM | POA: Diagnosis not present

## 2015-05-16 DIAGNOSIS — I472 Ventricular tachycardia: Secondary | ICD-10-CM | POA: Diagnosis not present

## 2015-05-16 DIAGNOSIS — E785 Hyperlipidemia, unspecified: Secondary | ICD-10-CM | POA: Diagnosis present

## 2015-05-16 DIAGNOSIS — J9600 Acute respiratory failure, unspecified whether with hypoxia or hypercapnia: Secondary | ICD-10-CM | POA: Diagnosis not present

## 2015-05-16 DIAGNOSIS — Z9862 Peripheral vascular angioplasty status: Secondary | ICD-10-CM

## 2015-05-16 DIAGNOSIS — N400 Enlarged prostate without lower urinary tract symptoms: Secondary | ICD-10-CM | POA: Diagnosis present

## 2015-05-16 DIAGNOSIS — E8881 Metabolic syndrome: Secondary | ICD-10-CM | POA: Diagnosis present

## 2015-05-16 DIAGNOSIS — Z0189 Encounter for other specified special examinations: Secondary | ICD-10-CM

## 2015-05-16 DIAGNOSIS — E7800 Pure hypercholesterolemia, unspecified: Secondary | ICD-10-CM | POA: Diagnosis not present

## 2015-05-16 DIAGNOSIS — I25118 Atherosclerotic heart disease of native coronary artery with other forms of angina pectoris: Secondary | ICD-10-CM | POA: Diagnosis not present

## 2015-05-16 DIAGNOSIS — K219 Gastro-esophageal reflux disease without esophagitis: Secondary | ICD-10-CM | POA: Diagnosis present

## 2015-05-16 HISTORY — DX: Paroxysmal atrial fibrillation: I48.0

## 2015-05-16 HISTORY — DX: Metabolic syndrome: E88.81

## 2015-05-16 HISTORY — DX: Chronic diastolic (congestive) heart failure: I50.32

## 2015-05-16 HISTORY — DX: Metabolic syndrome: E88.810

## 2015-05-16 HISTORY — PX: CARDIAC CATHETERIZATION: SHX172

## 2015-05-16 LAB — TROPONIN I: Troponin I: 1.09 ng/mL (ref <0.031)

## 2015-05-16 LAB — CBC
HCT: 37 % — ABNORMAL LOW (ref 39.0–52.0)
Hemoglobin: 12.6 g/dL — ABNORMAL LOW (ref 13.0–17.0)
MCH: 30.9 pg (ref 26.0–34.0)
MCHC: 34.1 g/dL (ref 30.0–36.0)
MCV: 90.7 fL (ref 78.0–100.0)
Platelets: 182 10*3/uL (ref 150–400)
RBC: 4.08 MIL/uL — ABNORMAL LOW (ref 4.22–5.81)
RDW: 12.7 % (ref 11.5–15.5)
WBC: 15.4 10*3/uL — ABNORMAL HIGH (ref 4.0–10.5)

## 2015-05-16 LAB — PLATELET COUNT: Platelets: 177 10*3/uL (ref 150–400)

## 2015-05-16 LAB — BRAIN NATRIURETIC PEPTIDE: B Natriuretic Peptide: 28.6 pg/mL (ref 0.0–100.0)

## 2015-05-16 LAB — MRSA PCR SCREENING: MRSA by PCR: NEGATIVE

## 2015-05-16 LAB — POCT ACTIVATED CLOTTING TIME: Activated Clotting Time: 564 seconds

## 2015-05-16 SURGERY — LEFT HEART CATH AND CORONARY ANGIOGRAPHY
Anesthesia: LOCAL

## 2015-05-16 MED ORDER — PHENAZOPYRIDINE HCL 100 MG PO TABS
100.0000 mg | ORAL_TABLET | Freq: Three times a day (TID) | ORAL | Status: DC
  Administered 2015-05-16: 100 mg via ORAL
  Filled 2015-05-16 (×5): qty 1

## 2015-05-16 MED ORDER — TICAGRELOR 90 MG PO TABS: 90.0000 mg | ORAL_TABLET | Freq: Two times a day (BID) | ORAL | Status: DC

## 2015-05-16 MED ORDER — ONDANSETRON HCL 4 MG/2ML IJ SOLN
INTRAMUSCULAR | Status: AC
  Filled 2015-05-16: qty 2

## 2015-05-16 MED ORDER — SODIUM CHLORIDE 0.9 % IJ SOLN
3.0000 mL | Freq: Two times a day (BID) | INTRAMUSCULAR | Status: DC
  Administered 2015-05-17 – 2015-05-19 (×5): 3 mL via INTRAVENOUS
  Administered 2015-05-20: 10 mL via INTRAVENOUS
  Administered 2015-05-21: 3 mL via INTRAVENOUS

## 2015-05-16 MED ORDER — ATROPINE SULFATE 0.1 MG/ML IJ SOLN
INTRAMUSCULAR | Status: AC
  Filled 2015-05-16: qty 10

## 2015-05-16 MED ORDER — BIVALIRUDIN BOLUS VIA INFUSION - CUPID
INTRAVENOUS | Status: DC | PRN
  Administered 2015-05-16: 63.75 mg via INTRAVENOUS

## 2015-05-16 MED ORDER — ONDANSETRON HCL 4 MG/2ML IJ SOLN
4.0000 mg | Freq: Four times a day (QID) | INTRAMUSCULAR | Status: DC | PRN
  Administered 2015-05-18: 4 mg via INTRAVENOUS
  Filled 2015-05-16 (×2): qty 2

## 2015-05-16 MED ORDER — NITROGLYCERIN IN D5W 200-5 MCG/ML-% IV SOLN
INTRAVENOUS | Status: AC
  Filled 2015-05-16: qty 250

## 2015-05-16 MED ORDER — AMIODARONE HCL IN DEXTROSE 360-4.14 MG/200ML-% IV SOLN
60.0000 mg/h | INTRAVENOUS | Status: DC
  Administered 2015-05-16 (×2): 60 mg/h via INTRAVENOUS
  Filled 2015-05-16 (×2): qty 200

## 2015-05-16 MED ORDER — TIROFIBAN (AGGRASTAT) BOLUS VIA INFUSION
INTRAVENOUS | Status: DC | PRN
  Administered 2015-05-16: 2125 ug via INTRAVENOUS

## 2015-05-16 MED ORDER — AMIODARONE LOAD VIA INFUSION
150.0000 mg | Freq: Once | INTRAVENOUS | Status: AC
  Administered 2015-05-16: 150 mg via INTRAVENOUS
  Filled 2015-05-16: qty 83.34

## 2015-05-16 MED ORDER — FENTANYL CITRATE (PF) 100 MCG/2ML IJ SOLN
INTRAMUSCULAR | Status: AC
  Filled 2015-05-16: qty 4

## 2015-05-16 MED ORDER — MIDAZOLAM HCL 2 MG/2ML IJ SOLN
INTRAMUSCULAR | Status: DC | PRN
  Administered 2015-05-16 (×2): 1 mg via INTRAVENOUS

## 2015-05-16 MED ORDER — BIVALIRUDIN 250 MG IV SOLR
250.0000 mg | INTRAVENOUS | Status: DC | PRN
  Administered 2015-05-16 (×2): 1.75 mg/kg/h via INTRAVENOUS

## 2015-05-16 MED ORDER — SODIUM CHLORIDE 0.9 % IJ SOLN: 3.0000 mL | INTRAMUSCULAR | Status: DC | PRN

## 2015-05-16 MED ORDER — SODIUM CHLORIDE 0.9 % IV SOLN
INTRAVENOUS | Status: DC | PRN
  Administered 2015-05-16: 10 mL/h via INTRAVENOUS

## 2015-05-16 MED ORDER — MIDAZOLAM HCL 2 MG/2ML IJ SOLN
INTRAMUSCULAR | Status: AC
  Filled 2015-05-16: qty 4

## 2015-05-16 MED ORDER — CETYLPYRIDINIUM CHLORIDE 0.05 % MT LIQD
7.0000 mL | Freq: Two times a day (BID) | OROMUCOSAL | Status: DC
  Administered 2015-05-17: 7 mL via OROMUCOSAL

## 2015-05-16 MED ORDER — TIROFIBAN HCL IN NACL 5-0.9 MG/100ML-% IV SOLN
INTRAVENOUS | Status: DC | PRN
  Administered 2015-05-16: 0.15 ug/kg/min via INTRAVENOUS

## 2015-05-16 MED ORDER — NOREPINEPHRINE BITARTRATE 1 MG/ML IV SOLN
4.0000 mg | INTRAVENOUS | Status: DC | PRN
  Administered 2015-05-16: 10 ug/kg/min via INTRAVENOUS

## 2015-05-16 MED ORDER — ATROPINE SULFATE 0.1 MG/ML IJ SOLN
INTRAMUSCULAR | Status: DC | PRN
  Administered 2015-05-16: 1 mg via INTRAVENOUS

## 2015-05-16 MED ORDER — SODIUM CHLORIDE 0.9 % WEIGHT BASED INFUSION
1.0000 mL/kg/h | INTRAVENOUS | Status: DC
  Administered 2015-05-16: 1 mL/kg/h via INTRAVENOUS

## 2015-05-16 MED ORDER — ASPIRIN 81 MG PO CHEW
81.0000 mg | CHEWABLE_TABLET | Freq: Every day | ORAL | Status: DC
  Administered 2015-05-17 – 2015-05-21 (×5): 81 mg via ORAL
  Filled 2015-05-16 (×5): qty 1

## 2015-05-16 MED ORDER — ONDANSETRON HCL 4 MG/2ML IJ SOLN
INTRAMUSCULAR | Status: DC | PRN
  Administered 2015-05-16: 4 mg via INTRAVENOUS

## 2015-05-16 MED ORDER — SODIUM CHLORIDE 0.9 % IV SOLN
INTRAVENOUS | Status: DC
  Administered 2015-05-16 – 2015-05-17 (×4): via INTRAVENOUS

## 2015-05-16 MED ORDER — TICAGRELOR 90 MG PO TABS
ORAL_TABLET | ORAL | Status: AC
  Filled 2015-05-16: qty 2

## 2015-05-16 MED ORDER — BIVALIRUDIN 250 MG IV SOLR
INTRAVENOUS | Status: AC
  Filled 2015-05-16: qty 250

## 2015-05-16 MED ORDER — FENTANYL CITRATE (PF) 100 MCG/2ML IJ SOLN
INTRAMUSCULAR | Status: DC | PRN
  Administered 2015-05-16: 50 ug via INTRAVENOUS

## 2015-05-16 MED ORDER — FAMOTIDINE 20 MG PO TABS
40.0000 mg | ORAL_TABLET | Freq: Every day | ORAL | Status: DC
  Administered 2015-05-17 – 2015-05-20 (×5): 40 mg via ORAL
  Filled 2015-05-16 (×5): qty 2

## 2015-05-16 MED ORDER — ATORVASTATIN CALCIUM 80 MG PO TABS
80.0000 mg | ORAL_TABLET | Freq: Every day | ORAL | Status: DC
  Administered 2015-05-17 – 2015-05-20 (×4): 80 mg via ORAL
  Filled 2015-05-16 (×4): qty 1

## 2015-05-16 MED ORDER — LIDOCAINE HCL (PF) 1 % IJ SOLN
INTRAMUSCULAR | Status: AC
  Filled 2015-05-16: qty 30

## 2015-05-16 MED ORDER — TIROFIBAN HCL IN NACL 5-0.9 MG/100ML-% IV SOLN
0.1500 ug/kg/min | INTRAVENOUS | Status: AC
  Administered 2015-05-16 – 2015-05-17 (×3): 0.15 ug/kg/min via INTRAVENOUS
  Filled 2015-05-16 (×3): qty 100

## 2015-05-16 MED ORDER — NOREPINEPHRINE BITARTRATE 1 MG/ML IV SOLN
2.0000 ug/min | INTRAVENOUS | Status: DC
  Administered 2015-05-16: 15 ug/min via INTRAVENOUS
  Filled 2015-05-16 (×2): qty 4

## 2015-05-16 MED ORDER — SODIUM CHLORIDE 0.9 % IV SOLN
INTRAVENOUS | Status: DC
  Administered 2015-05-16 – 2015-05-17 (×2): via INTRAVENOUS
  Administered 2015-05-17 (×2): 87.9 mL/h via INTRAVENOUS

## 2015-05-16 MED ORDER — SODIUM CHLORIDE 0.9 % IV SOLN: 250.0000 mL | INTRAVENOUS | Status: DC | PRN

## 2015-05-16 MED ORDER — TICAGRELOR 90 MG PO TABS
ORAL_TABLET | ORAL | Status: DC | PRN
  Administered 2015-05-16: 180 mg via ORAL

## 2015-05-16 MED ORDER — DOPAMINE-DEXTROSE 3.2-5 MG/ML-% IV SOLN
INTRAVENOUS | Status: DC | PRN
  Administered 2015-05-16: 5 ug/kg/min via INTRAVENOUS

## 2015-05-16 MED ORDER — HEPARIN (PORCINE) IN NACL 2-0.9 UNIT/ML-% IJ SOLN
INTRAMUSCULAR | Status: AC
  Filled 2015-05-16: qty 1000

## 2015-05-16 MED ORDER — NITROGLYCERIN 1 MG/10 ML FOR IR/CATH LAB
INTRA_ARTERIAL | Status: AC
  Filled 2015-05-16: qty 10

## 2015-05-16 MED ORDER — AMIODARONE HCL IN DEXTROSE 360-4.14 MG/200ML-% IV SOLN
30.0000 mg/h | INTRAVENOUS | Status: DC
  Administered 2015-05-17 (×2): 30 mg/h via INTRAVENOUS
  Filled 2015-05-16: qty 200

## 2015-05-16 MED ORDER — TICAGRELOR 90 MG PO TABS
90.0000 mg | ORAL_TABLET | Freq: Two times a day (BID) | ORAL | Status: DC
Start: 2015-05-16 — End: 2015-05-16

## 2015-05-16 MED ORDER — VERAPAMIL HCL 2.5 MG/ML IV SOLN
INTRAVENOUS | Status: AC
  Filled 2015-05-16: qty 2

## 2015-05-16 MED ORDER — NOREPINEPHRINE BITARTRATE 1 MG/ML IV SOLN
INTRAVENOUS | Status: AC
  Filled 2015-05-16: qty 4

## 2015-05-16 MED ORDER — IOHEXOL 350 MG/ML SOLN
INTRAVENOUS | Status: DC | PRN
  Administered 2015-05-16: 110 mL via INTRACARDIAC

## 2015-05-16 MED ORDER — ACETAMINOPHEN 325 MG PO TABS: 650.0000 mg | ORAL_TABLET | ORAL | Status: DC | PRN

## 2015-05-16 MED ORDER — HEPARIN (PORCINE) IN NACL 100-0.45 UNIT/ML-% IJ SOLN
1000.0000 [IU]/h | INTRAMUSCULAR | Status: DC
  Administered 2015-05-16: 850 [IU]/h via INTRAVENOUS
  Filled 2015-05-16: qty 250

## 2015-05-16 SURGICAL SUPPLY — 21 items
BALLN LINEAR 7.5FR IABP 40CC (BALLOONS) ×2
BALLN TREK RX 4.0X15 (BALLOONS) ×2 IMPLANT
BALLN ~~LOC~~ TREK RX 5.0X15 (BALLOONS) ×2 IMPLANT
BALLOON LINEAR 7.5FR IABP 40CC (BALLOONS) ×1 IMPLANT
CATH EXTRAC PRONTO 5.5F 138CM (CATHETERS) ×2 IMPLANT
CATH INFINITI 5 FR JL3.5 (CATHETERS) ×2 IMPLANT
CATH INFINITI 5FR JL4 (CATHETERS) ×2 IMPLANT
CATH INFINITI JR4 5F (CATHETERS) ×2 IMPLANT
CATH VISTA GUIDE 6FR XBRCA (CATHETERS) ×2 IMPLANT
DEVICE SECURE STATLOCK IABP (MISCELLANEOUS) ×4 IMPLANT
ELECT DEFIB PAD ADLT CADENCE (PAD) ×2 IMPLANT
GLIDESHEATH SLEND A-KIT 6F 22G (SHEATH) IMPLANT
KIT ENCORE 26 ADVANTAGE (KITS) ×2 IMPLANT
KIT HEART LEFT (KITS) ×2 IMPLANT
PACK CARDIAC CATHETERIZATION (CUSTOM PROCEDURE TRAY) ×2 IMPLANT
SHEATH PINNACLE 6F 10CM (SHEATH) ×4 IMPLANT
TRANSDUCER W/STOPCOCK (MISCELLANEOUS) ×2 IMPLANT
TUBING CIL FLEX 10 FLL-RA (TUBING) IMPLANT
WIRE ASAHI PROWATER 180CM (WIRE) ×2 IMPLANT
WIRE EMERALD 3MM-J .035X150CM (WIRE) ×2 IMPLANT
WIRE SAFE-T 1.5MM-J .035X260CM (WIRE) IMPLANT

## 2015-05-16 NOTE — Progress Notes (Signed)
Spoke with MD Tresa Endo to confirm IV fluid orders and to inform MD that patient experienced a 14 beat run of wide complex tachycardia.  Patient asymptomatic and vital signs stable. Ivery Quale, RN

## 2015-05-16 NOTE — Progress Notes (Signed)
Patient complains of discomfort in middle of chest. Patient states he feels this is not pain but discomfort related to patient's GERD and rates this as a 1 out of 10.  Patient normally takes Zantac 75 mg at bedtime and no order for this medication currently.  Paged MD Tresa Endo to inform, new orders received. Ivery Quale, RN

## 2015-05-16 NOTE — Progress Notes (Signed)
CRITICAL VALUE ALERT  Critical value received:  Troponin 1.09   Date of notification:  05/16/2015  Time of notification:  1935  Critical value read back:Yes.    MD notified (1st page):  PA Hager  Time of first page:  1939   PA Leron Croak returned call at [facility] & informed of Troponin value.  Ivery Quale, RN

## 2015-05-16 NOTE — Progress Notes (Addendum)
ANTICOAGULATION CONSULT NOTE - Initial Consult  Pharmacy Consult:  Heparin Indication: chest pain/ACS s/p cath 10/26  No Known Allergies  Patient Measurements: Weight: 193 lb 12.6 oz (87.9 kg) Heparin Dosing Weight: 70 kg  Vital Signs: BP: 94/79 mmHg (10/26 1800) Pulse Rate: 119 (10/26 1800)  Labs:  Recent Labs  05/16/15 1845  HGB 12.6*  HCT 37.0*  PLT 182    CrCl cannot be calculated (Patient has no serum creatinine result on file.).   Medical History: Past Medical History  Diagnosis Date  . Hemorrhoids   . Tinnitus   . Hearing loss   . HH (hiatus hernia)   . GERD (gastroesophageal reflux disease)   . OSA (obstructive sleep apnea)     a. mild not requiring CPAP at this time  . CAD (coronary artery disease)     a. inf STEMI s/p BMS to mRCA (02/03/14)  . Ischemic cardiomyopathy     a. ECHO 01/2014 EF 45-50% and akinesis of the entire inferior myocardium. Mild MR.   [name] STEMI (ST elevation myocardial infarction) 02/02/14    BMS to RCA  . Varicosities of leg   . Varicose veins        Assessment: 19 YOM presented as a Code STEMI, now s/p cath and Pharmacy consulted to initiate IV heparin 8 hours post sheath removal.  RN reported patient has a balloon pump so the sheath will not be removed.  Per discussion with Dr. Katrinka Blazing, will initiate IV heparin now instead of waiting for 8 hours after sheath removal.  Patient is also on Aggrastat for 24 hours.  Baseline CBC reviewed and no bleeding per RN.   Goal of Therapy:  HL 0.3 - 0.5 units/mL while on Aggrastat Monitor platelets by anticoagulation protocol: Yes    Plan:  - Initiate heparin gtt at 850 units/hr, no bolus - Aggrastat at 15.8 ml/hr per MD - Check 6 hr HL - Daily HL / CBC   Henry Rogers, PharmD, BCPS Pager:  [phone] 05/16/2015, 7:14 PM

## 2015-05-16 NOTE — Progress Notes (Signed)
Orthopedic Tech Progress Note Patient Details:  ADRIN BOURDON [birth year] [id number]  Ortho Devices Type of Ortho Device: Knee Immobilizer Ortho Device/Splint Location: RLE Ortho Device/Splint Interventions: Ordered, Application   Jennye Moccasin 05/16/2015, 6:08 PM

## 2015-05-16 NOTE — H&P (Signed)
Henry Rogers is a 64 y.o. male  Admit Date: 05/16/2015 Referring Physician: Zacarias Rogers / Emergency Medical Service department Primary Cardiologist: Henry Martinique, MD Chief complaint / reason for admission: Inferior ST elevation myocardial infarction with cardiogenic shock  HPI: 64 year old gentleman with history of coronary artery disease and previous inferior wall STEMI 02/03/2014 treated with a bare-metal stent by Dr. Martinique. The right coronary is ectatic/very large in diameter and was treated with a 5.0 bare-metal stent. Henry Rogers was discontinued in July. Patient has done well since that time. 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 Shortly thereafter he took nitroglycerin sublingually 2. Emergency medical service was then contacted an EKG performed in route to the hospital revealed inferior ST elevation infarction. Chest discomfort started around 3:15 PM. Upon arrival in the ambulance bay, the patient was in shock with a last recorded systolic blood pressure of 65 mmHg. I made the decision not to stop in the emergency room but to bring him straight to the cath lab.  He complained of 8/10 chest discomfort similar to that during his initial presentation in 2015. He was shivering and cold. He had not received analgesia nor heparin.    PMH:    Past Medical History  Diagnosis Date  . Hemorrhoids   . Tinnitus   . Hearing loss   . HH (hiatus hernia)   . GERD (gastroesophageal reflux disease)   . OSA (obstructive sleep apnea)     a. mild not requiring CPAP at this time  . CAD (coronary artery disease)     a. inf STEMI s/p BMS to mRCA (02/03/14)  . Ischemic cardiomyopathy     a. ECHO 01/2014 EF 45-50% and akinesis of the entire inferior myocardium. Mild MR.   [name] STEMI (ST elevation myocardial infarction) 02/02/14    BMS to RCA  . Varicosities of leg   . Varicose veins     PSH:    Past Surgical History  Procedure Laterality Date  . Colonoscopy  2006    Dr. Benson Rogers  . Coronary angioplasty with stent placement  02/02/14    BMS to RCA  . Left heart catheterization with coronary angiogram N/A 02/02/2014    Procedure: LEFT HEART CATHETERIZATION WITH CORONARY ANGIOGRAM;  Surgeon: Henry M Martinique, MD;  Location: [facility] CATH LAB;  Service: Cardiovascular;  Laterality: N/A;  . Endovenous ablation saphenous vein w/ laser Right 10-12-2014    EVLA right greater saphenous vein by Henry Jews MD  . Carpal tunnel release Bilateral 03/02/2015    Procedure: BILATERAL CARPAL TUNNEL RELEASE ;  Surgeon: Henry Brod, MD;  Location: [facility];  Service: Orthopedics;  Laterality: Bilateral;  . Stab phlebectomy  Right 03-15-2015    stab phlebectomy > 20 incisions (right leg) by Henry Jews MD   ALLERGIES:   Review of patient's allergies indicates no known allergies. Prior to Admit Meds:   Prescriptions prior to admission  Medication Sig Dispense Refill Last Dose  . aspirin 81 MG tablet Take 81 mg by mouth daily.   Taking  . atorvastatin (LIPITOR) 80 MG tablet Take 1 tablet (80 mg total) by mouth daily at 6 PM. 90 tablet 3 Taking  . Melatonin 10 MG CAPS Take 10 mg by mouth at bedtime as needed (for sleep).    Taking  . metoprolol tartrate (LOPRESSOR) 12.5 mg TABS tablet Take 12.5 mg (half tablet) twice daily 60 tablet 11 Taking  . metoprolol tartrate (LOPRESSOR) 25 MG tablet TAKE ONE-HALF  TABLET BY MOUTH TWICE DAILY 30 tablet 5   . Multiple Vitamin (MULTIVITAMIN) tablet Take 1 tablet by mouth daily.   Taking  . nitroGLYCERIN (NITROSTAT) 0.4 MG SL tablet Place 1 tablet (0.4 mg total) under the tongue every 5 (five) minutes x 3 doses as needed for chest pain. 25 tablet 12 Taking  . Nutritional Supplements (JUICE PLUS FIBRE PO) Take 1 tablet by mouth 2 (two) times daily. Fruit blend   Taking  . omeprazole (PRILOSEC) 40 MG capsule Take 1 capsule (40 mg total) by mouth daily. 90 capsule 3 Taking  . ranitidine (ZANTAC) 75 MG tablet Take 75 mg by mouth at bedtime as  needed for heartburn.   Taking  . tamsulosin (FLOMAX) 0.4 MG CAPS capsule TAKE 2 CAPSULES BY MOUTH DAILY 180 capsule 0    Family HX:    Family History  Problem Relation Age of Onset  . Hypertension Mother   . Cancer Maternal Grandmother    Social HX:    Social History   Social History  . Marital Status: Married    Spouse Name: N/A  . Number of Children: N/A  . Years of Education: N/A   Occupational History  . Not on file.   Social History Main Topics  . Smoking status: Former Research scientist (life sciences)  . Smokeless tobacco: Never Used  . Alcohol Use: 1.8 oz/week    3 Glasses of wine per week  . Drug Use: No  . Sexual Activity: Not Currently   Other Topics Concern  . Not on file   Social History Narrative     ROS: Totally asymptomatic prior to presentation. Has a history of sleep apnea with which he complies with C Pap therapy intermittently. Has difficulty with erectile dysfunction and slow urinary flow. There is also a history of gastroesophageal reflux.  Physical Exam:  Initial blood pressure in the ambulance a 65/40 mmHg. The patient was ashen and gray. Blood pressure 93/67, pulse 0, resp. rate 6, weight 87.9 kg (193 lb 12.6 oz), SpO2 0 %.    Skin is cool to the touch. No diaphoresis is noted. Ashen in color. HEENT exam reveals pupils are equal and reactive. No jaundice is noted. Patient is frightened and grimacing in pain. Neck exam reveals no obvious physical abnormalities. Mild JVD is noted. Chest is clear to auscultation and percussion Cardiac exam reveals an S4 gallop but otherwise unremarkable. No murmur or rub is heard. Abdomen is nontender. Bowel sounds are normal. Extremities reveal no edema in the legs. Radial pulses are 1+ bilateral. Carotids are 2+ and symmetric. Femoral pulses are 1-2+. Neurological exam reveals a patient who is age is, writhing in pain, able to give an accurate and coherent history, and hyperventilating.  Labs: Lab Results  Component Value Date    WBC 6.3 01/03/2015   HGB 16.7 03/02/2015   HCT 49.0 03/02/2015   MCV 92.3 01/03/2015   PLT 218 01/03/2015   No results for input(s): NA, K, CL, CO2, BUN, CREATININE, CALCIUM, PROT, BILITOT, ALKPHOS, ALT, AST, GLUCOSE in the last 168 hours.  Invalid input(s): LABALBU Lab Results  Component Value Date   CKTOTAL 161 02/02/2014   CKMB 2.2 02/02/2014   TROPONINI 18.58* 02/04/2014      Radiology:  No results found.  EKG:  Normal sinus rhythm with ST elevation of 3 mm in limb lead 2, limb lead 3, aVF, and V4 through V6.  ASSESSMENT:  1. Acute inferior ST elevation myocardial infarction. Previous bare metal stenting greater than one  year ago. Recent discontinuation of dual antiplatelet therapy. 2. Cardiogenic shock secondary to acute infarction and nitroglycerin administration 3. Cardiac risk factors including hyperlipidemia, erectile dysfunction, metabolic syndrome, and prior acute infarction.  Plan:  1. Emergency coronary angiography and reperfusion therapy if possible. 2. Wide-open IV fluid to support right heart filling in this setting with probable right ventricular infarction also present.   The patient was met in the ambulance bay, he was critically ill and in cardiogenic shock. A total of 35 minutes was spent with the patient from the time of arrival until beginning the procedure. All care was face-to-face and an involved directing care in any high risk critical care scenario.    Sinclair Grooms 05/16/2015 6:18 PM

## 2015-05-17 ENCOUNTER — Inpatient Hospital Stay (HOSPITAL_COMMUNITY): Payer: Medicare Other

## 2015-05-17 ENCOUNTER — Encounter (HOSPITAL_COMMUNITY): Payer: Self-pay | Admitting: *Deleted

## 2015-05-17 DIAGNOSIS — N170 Acute kidney failure with tubular necrosis: Secondary | ICD-10-CM

## 2015-05-17 DIAGNOSIS — E7800 Pure hypercholesterolemia, unspecified: Secondary | ICD-10-CM

## 2015-05-17 LAB — COMPREHENSIVE METABOLIC PANEL
ALT: 50 U/L (ref 17–63)
AST: 161 U/L — ABNORMAL HIGH (ref 15–41)
Albumin: 3.2 g/dL — ABNORMAL LOW (ref 3.5–5.0)
Alkaline Phosphatase: 61 U/L (ref 38–126)
Anion gap: 5 (ref 5–15)
BUN: 19 mg/dL (ref 6–20)
CO2: 23 mmol/L (ref 22–32)
Calcium: 7.7 mg/dL — ABNORMAL LOW (ref 8.9–10.3)
Chloride: 107 mmol/L (ref 101–111)
Creatinine, Ser: 1.27 mg/dL — ABNORMAL HIGH (ref 0.61–1.24)
GFR calc Af Amer: >60 mL/min (ref >60)
GFR calc non Af Amer: 57 mL/min — ABNORMAL LOW (ref >60)
Glucose, Bld: 168 mg/dL — ABNORMAL HIGH (ref 65–99)
Potassium: 3.6 mmol/L (ref 3.5–5.1)
Sodium: 135 mmol/L (ref 135–145)
Total Bilirubin: 0.8 mg/dL (ref 0.3–1.2)
Total Protein: 5.3 g/dL — ABNORMAL LOW (ref 6.5–8.1)

## 2015-05-17 LAB — CBC
HCT: 37.9 % — ABNORMAL LOW (ref 39.0–52.0)
Hemoglobin: 12.5 g/dL — ABNORMAL LOW (ref 13.0–17.0)
MCH: 30.7 pg (ref 26.0–34.0)
MCHC: 33 g/dL (ref 30.0–36.0)
MCV: 93.1 fL (ref 78.0–100.0)
Platelets: 182 10*3/uL (ref 150–400)
RBC: 4.07 MIL/uL — ABNORMAL LOW (ref 4.22–5.81)
RDW: 13.3 % (ref 11.5–15.5)
WBC: 9.1 10*3/uL (ref 4.0–10.5)

## 2015-05-17 LAB — TROPONIN I
Troponin I: 20.31 ng/mL (ref <0.031)
Troponin I: 47.06 ng/mL (ref <0.031)
Troponin I: >65 ng/mL (ref <0.031)
Troponin I: 61.24 ng/mL (ref <0.031)

## 2015-05-17 LAB — POCT ACTIVATED CLOTTING TIME: Activated Clotting Time: 122 seconds

## 2015-05-17 LAB — HEPARIN LEVEL (UNFRACTIONATED): Heparin Unfractionated: 0.12 IU/mL — ABNORMAL LOW (ref 0.30–0.70)

## 2015-05-17 MED ORDER — METOPROLOL TARTRATE 12.5 MG HALF TABLET
12.5000 mg | ORAL_TABLET | Freq: Two times a day (BID) | ORAL | Status: DC
  Administered 2015-05-17: 12.5 mg via ORAL
  Filled 2015-05-17: qty 1

## 2015-05-17 MED ORDER — TICAGRELOR 90 MG PO TABS
90.0000 mg | ORAL_TABLET | Freq: Two times a day (BID) | ORAL | Status: DC
  Administered 2015-05-17 – 2015-05-21 (×9): 90 mg via ORAL
  Filled 2015-05-17 (×9): qty 1

## 2015-05-17 MED ORDER — PANTOPRAZOLE SODIUM 40 MG PO TBEC
40.0000 mg | DELAYED_RELEASE_TABLET | Freq: Every day | ORAL | Status: DC
  Administered 2015-05-17 – 2015-05-21 (×5): 40 mg via ORAL
  Filled 2015-05-17 (×5): qty 1

## 2015-05-17 MED ORDER — GUAIFENESIN 100 MG/5ML PO SOLN: 5.0000 mL | ORAL | Status: DC | PRN

## 2015-05-17 MED ORDER — GUAIFENESIN 100 MG/5ML PO SOLN
10.0000 mL | ORAL | Status: DC | PRN
  Administered 2015-05-17 – 2015-05-21 (×15): 200 mg via ORAL
  Filled 2015-05-17 (×4): qty 10
  Filled 2015-05-17: qty 5
  Filled 2015-05-17 (×5): qty 10
  Filled 2015-05-17: qty 5
  Filled 2015-05-17 (×2): qty 10
  Filled 2015-05-17: qty 5
  Filled 2015-05-17: qty 10
  Filled 2015-05-17: qty 5
  Filled 2015-05-17: qty 10
  Filled 2015-05-17: qty 5
  Filled 2015-05-17 (×2): qty 10

## 2015-05-17 MED ORDER — HEPARIN SODIUM (PORCINE) 5000 UNIT/ML IJ SOLN
5000.0000 [IU] | Freq: Three times a day (TID) | INTRAMUSCULAR | Status: DC
  Administered 2015-05-17 – 2015-05-19 (×4): 5000 [IU] via SUBCUTANEOUS
  Filled 2015-05-17 (×8): qty 1

## 2015-05-17 MED ORDER — HEPARIN SODIUM (PORCINE) 5000 UNIT/ML IJ SOLN: 5000.0000 [IU] | Freq: Three times a day (TID) | INTRAMUSCULAR | Status: DC

## 2015-05-17 MED FILL — Lidocaine HCl Local Preservative Free (PF) Inj 1%: INTRAMUSCULAR | Qty: 30 | Status: AC

## 2015-05-17 MED FILL — Heparin Sodium (Porcine) 2 Unit/ML in Sodium Chloride 0.9%: INTRAMUSCULAR | Qty: 1000 | Status: AC

## 2015-05-17 NOTE — Progress Notes (Signed)
Doing well. No chest pain. IABP out. Off pressors, Aggrastat, and amiodarone. BP is good. Will start beta blocker.   Peter Swaziland MD, [facility]

## 2015-05-17 NOTE — Plan of Care (Signed)
Problem: Phase I Progression Outcomes Goal: Anginal pain relieved Outcome: Progressing Patient currently complaining of chest discomfort, rated as 1 out of 10.  Patient believes this may be related to GERD.  MD aware and new orders received. Goal: Voiding-avoid urinary catheter unless indicated Outcome: Completed/Met Date Met:  05/17/15 Patient voiding.  Catheter in place per IABP protocol.  Goal: Hemodynamically stable Outcome: Progressing Patient on Levophed, blood pressure stable and levophed being titrated down.       Goal: Vascular site scale level 0 - I Vascular Site Scale Level 0: No bruising/bleeding/hematoma Level I (Mild): Bruising/Ecchymosis, minimal bleeding/ooozing, palpable hematoma < 3 cm Level II (Moderate): Bleeding not affecting hemodynamic parameters, pseudoaneurysm, palpable hematoma > 3 cm Level III (Severe) Bleeding which affects hemodynamic parameters or retroperitoneal hemorrhage  Outcome: Completed/Met Date Met:  05/17/15 Vascular site level 0

## 2015-05-17 NOTE — Progress Notes (Signed)
CRITICAL VALUE ALERT  Critical value received:  Troponin 20.31  Date of notification:  05/17/2015  Time of notification:  0121  Critical value read back:Yes.    MD notified (1st page):  MD Tresa Endo  Time of first page:  0123  Responding MD:  MD Tresa Endo  Time MD responded:  0126  Also informed MD that patient's urine output slowing down a bit, RN will continue to monitor and will reach out to MD as needed.  Ivery Quale, RN

## 2015-05-17 NOTE — Progress Notes (Deleted)
Discussed performing an in and out catheter with patient as patient not able to void in urinal yet and bladder scan showed approximately 500 mL of urine.  Patient requested more time to try and void.  RN will continue to monitor. Ivery Quale, RN

## 2015-05-17 NOTE — Progress Notes (Addendum)
Patient's urine output 20-30 mL/hr, flushed foley twice with no improvement in return.  Performed bladder scan, shows approximately 50 mL in bladder.  Patient experiencing bladder spasms, penis is very sensitive, and patient very uncomfortable with foley in place.  Spoke with MD Tresa Endo, MD advised okay to remove Foley and follow foley removal protocol.  MD Tresa Endo advised it was okay to replace foley if needed.   Performed Foley and Peri care and removed Foley. Ivery Quale, RN

## 2015-05-17 NOTE — Progress Notes (Signed)
TELEMETRY: Reviewed telemetry pt in NSR with occ. NSVT up to 6 beats this am and some AIVR: Filed Vitals:   05/17/15 0600 05/17/15 0630 05/17/15 0700 05/17/15 0800  BP: 123/82 126/82 128/75 125/74  Pulse: 70 77 72 69  Temp:    97.7 F (36.5 C)  TempSrc:    Oral  Resp: 10 14 19 16   Height:      Weight:      SpO2: 97% 96% 98% 96%    Intake/Output Summary (Last 24 hours) at 05/17/15 0911 Last data filed at 05/17/15 0800  Gross per 24 hour  Intake 5093.25 ml  Output    662 ml  Net 4431.25 ml   Filed Weights   05/16/15 1724 05/16/15 1830  Weight: 87.9 kg (193 lb 12.6 oz) 87.9 kg (193 lb 12.6 oz)    Subjective Feeling much better. No chest pain or SOB. Groin feels OK.   [name] antiseptic oral rinse  7 mL Mouth Rinse BID  . aspirin  81 mg Oral Daily  . atorvastatin  80 mg Oral q1800  . famotidine  40 mg Oral QHS  . sodium chloride  3 mL Intravenous Q12H  . ticagrelor  90 mg Oral BID   . sodium chloride 87.9 mL/hr (05/17/15 0852)  . amiodarone 30 mg/hr (05/17/15 0800)  . heparin 1,000 Units/hr (05/17/15 0800)  . norepinephrine (LEVOPHED) Adult infusion Stopped (05/17/15 0910)  . tirofiban 0.15 mcg/kg/min (05/17/15 0853)    LABS: Basic Metabolic Panel:  Recent Labs  74/12/87 0530  NA 135  K 3.6  CL 107  CO2 23  GLUCOSE 168*  BUN 19  CREATININE 1.27*  CALCIUM 7.7*   Liver Function Tests:  Recent Labs  05/17/15 0530  AST 161*  ALT 50  ALKPHOS 61  BILITOT 0.8  PROT 5.3*  ALBUMIN 3.2*   No results for input(s): LIPASE, AMYLASE in the last 72 hours. CBC:  Recent Labs  05/16/15 1845 05/16/15 2012 05/17/15 0530  WBC 15.4*  --  9.1  HGB 12.6*  --  12.5*  HCT 37.0*  --  37.9*  MCV 90.7  --  93.1  PLT 182 177 182   Cardiac Enzymes:  Recent Labs  05/16/15 1845 05/17/15 0015 05/17/15 0530  TROPONINI 1.09* 20.31* 47.06*   BNP: No results for input(s): PROBNP in the last 72 hours. D-Dimer: No results for input(s): DDIMER in the last 72  hours. Hemoglobin A1C: No results for input(s): HGBA1C in the last 72 hours. Fasting Lipid Panel: No results for input(s): CHOL, HDL, LDLCALC, TRIG, CHOLHDL, LDLDIRECT in the last 72 hours. Thyroid Function Tests: No results for input(s): TSH, T4TOTAL, T3FREE, THYROIDAB in the last 72 hours.  Invalid input(s): FREET3   Radiology/Studies:  Dg Chest Port 1 View  05/17/2015  CLINICAL DATA:  Assess intra-aortic balloon pump placement, acute MI, cardiogenic shock, decreased urine output, status post cardiac catheterization yesterday, coronary angioplasty in July 2015 EXAM: PORTABLE CHEST 1 VIEW COMPARISON:  Portable chest x-ray of May 16, 2015 FINDINGS: The intra-aortic balloon pump tip projects over the lower aspect of the aortic knob similar to that on yesterday's study. The lungs are well-expanded. The interstitial markings are mildly prominent though stable. The heart is normal in size. The pulmonary vascularity is not engorged. IMPRESSION: 1. The intra-aortic balloon pump position appears radiographically stable and appropriate. 2. There is stable minimal interstitial prominence bilaterally. No acute pulmonary edema is observed. Electronically Signed   By: David  [place] M.D.  On: 05/17/2015 08:09   Dg Chest Port 1 View  05/16/2015  CLINICAL DATA:  Myocardial infarction earlier today EXAM: PORTABLE CHEST 1 VIEW COMPARISON:  Chest CT January 18, 2015 FINDINGS: Intra-aortic balloon pump tip is at the level of T5 just inferior to the aortic arch. No pneumothorax. Lungs are clear. Heart size and pulmonary vascularity are normal. No adenopathy. No bone lesions. IMPRESSION: No edema or consolidation. Intra-aortic balloon pump tip just inferior to the aortic arch. Electronically Signed   By: Bretta Bang III M.D.   On: 05/16/2015 19:16   Ecg: NSR with Q waves inferiorly. Marked ST elevation resolved.    PHYSICAL EXAM With IABP off BP 113/89 with mean 97 mm Hg. General: Well developed, well  nourished, in no acute distress. Head: Normocephalic, atraumatic, sclera non-icteric, oropharynx is clear Neck: Negative for carotid bruits. JVD not elevated. No adenopathy Lungs: Clear bilaterally to auscultation without wheezes, rales, or rhonchi. Breathing is unlabored. Heart: RRR S1 S2 without murmurs, rubs, or gallops.  Abdomen: Soft, non-tender, non-distended with normoactive bowel sounds. No hepatomegaly. No rebound/guarding. No obvious abdominal masses. Msk:  Strength and tone appears normal for age. Extremities: No clubbing, cyanosis or edema.  Distal pedal pulses are 2+ and equal bilaterally. IABP in place right groin without hematoma. Neuro: Alert and oriented X 3. Moves all extremities spontaneously. Psych:  Responds to questions appropriately with a normal affect.  ASSESSMENT AND PLAN: 1. Acute inferior STEMI. S/p POBA of the distal RCA with significant no reflow. Associated cardiogenic shock. Will continue DAPT indefinitely. Continue Aggrastat for 18 hours post PCI. Follow up Echo today. Hold beta blocker and ACEi now until off pressors and IABP.  2. NSVT. Longest 15 beats last night. Due to reperfusion arrhythmia. Will continue amiodarone for 24 hours then DC and observe rhythm 3. Cardiogenic shock due to inferior STEMI with possible RV component. Will assess with Echo. BP is very good this am. Will DC IV levophed and get IABP out today.  4. Hypercholesterolemia. On high dose statin. 5. Acute renal failure due to ATN with shock and contrast. Monitor renal output and daily BMET.   Present on Admission:  . Acute ST elevation myocardial infarction (STEMI) (HCC)  Signed, Luzmaria Devaux [place], MDFACC 05/17/2015 9:11 AM

## 2015-05-17 NOTE — Progress Notes (Signed)
IABP aspirated and removed from rfa, manual pressure applied for 30 minutes. 6Fr sheath aspirated and removed from rfv. Manual pressure applied for 10 minutes. Tegaderm dressing applied. Bedrest instructions given. Groin level 0. Bilateral dp and pt pulses palpable.   Bedrest begins and 12:00:00 noon.

## 2015-05-17 NOTE — Progress Notes (Signed)
ANTICOAGULATION CONSULT NOTE - Follow Up Consult  Pharmacy Consult for heparin Indication: IABP  Labs:  Recent Labs  05/16/15 1845 05/16/15 2012 05/17/15 0015 05/17/15 0350  HGB 12.6*  --   --   --   HCT 37.0*  --   --   --   PLT 182 177  --   --   HEPARINUNFRC  --   --   --  0.12*  TROPONINI 1.09*  --  20.31*  --      Assessment: 64yo male subtherapeutic on heparin after started post-cath w/ IABP placed and Aggrastat running.  Goal of Therapy:  Heparin level 0.2-0.5 units/ml   Plan:  Will increase heparin gtt by 2 units/kg/hr to 1000 units/hr and check level in 6hr.  Vernard Gambles, PharmD, BCPS  05/17/2015,5:04 AM

## 2015-05-17 NOTE — Progress Notes (Signed)
CRITICAL VALUE ALERT  Critical value received:  Troponin 47.06  Date of notification:  No notification due to previous critical results  MD notified (1st page): PA Lisabeth Devoid  Time of first page:  0700  Responding MD: PA Lisabeth Devoid  Time MD responded:  [id number]  Ivery Quale, RN

## 2015-05-17 NOTE — Care Management Note (Addendum)
Case Management Note  Patient Details  Name: Henry Rogers MRN: [id number] Date of Birth: Apr 26, 1951  Subjective/Objective:     Adm w mi               Action/Plan: lives w wife, pcp dr Malva Limes   Expected Discharge Date:                  Expected Discharge Plan:  Home/Self Care  In-House Referral:     Discharge planning Services  CM Consult, Medication Assistance  Post Acute Care Choice:    Choice offered to:     DME Arranged:    DME Agency:     HH Arranged:    HH Agency:     Status of Service:     Medicare Important Message Given:    Date Medicare IM Given:    Medicare IM give by:    Date Additional Medicare IM Given:    Additional Medicare Important Message give by:     If discussed at Long Length of Stay Meetings, dates discussed:    Additional Comments: ur review done. Left pt 30day free brilinta card.  Hanley Hays, RN 05/17/2015, 9:58 AM

## 2015-05-18 ENCOUNTER — Inpatient Hospital Stay (HOSPITAL_COMMUNITY): Payer: Medicare Other

## 2015-05-18 DIAGNOSIS — I509 Heart failure, unspecified: Secondary | ICD-10-CM

## 2015-05-18 DIAGNOSIS — I472 Ventricular tachycardia: Secondary | ICD-10-CM

## 2015-05-18 DIAGNOSIS — J9620 Acute and chronic respiratory failure, unspecified whether with hypoxia or hypercapnia: Secondary | ICD-10-CM

## 2015-05-18 DIAGNOSIS — I25118 Atherosclerotic heart disease of native coronary artery with other forms of angina pectoris: Secondary | ICD-10-CM

## 2015-05-18 LAB — BASIC METABOLIC PANEL
Anion gap: 8 (ref 5–15)
BUN: 13 mg/dL (ref 6–20)
CO2: 22 mmol/L (ref 22–32)
Calcium: 8 mg/dL — ABNORMAL LOW (ref 8.9–10.3)
Chloride: 105 mmol/L (ref 101–111)
Creatinine, Ser: 0.95 mg/dL (ref 0.61–1.24)
GFR calc Af Amer: >60 mL/min (ref >60)
GFR calc non Af Amer: >60 mL/min (ref >60)
Glucose, Bld: 102 mg/dL — ABNORMAL HIGH (ref 65–99)
Potassium: 3.3 mmol/L — ABNORMAL LOW (ref 3.5–5.1)
Sodium: 135 mmol/L (ref 135–145)

## 2015-05-18 LAB — CBC
HCT: 37.3 % — ABNORMAL LOW (ref 39.0–52.0)
Hemoglobin: 12.5 g/dL — ABNORMAL LOW (ref 13.0–17.0)
MCH: 30.7 pg (ref 26.0–34.0)
MCHC: 33.5 g/dL (ref 30.0–36.0)
MCV: 91.6 fL (ref 78.0–100.0)
Platelets: 156 10*3/uL (ref 150–400)
RBC: 4.07 MIL/uL — ABNORMAL LOW (ref 4.22–5.81)
RDW: 13.4 % (ref 11.5–15.5)
WBC: 9.8 10*3/uL (ref 4.0–10.5)

## 2015-05-18 LAB — TROPONIN I
Troponin I: 37.73 ng/mL (ref <0.031)
Troponin I: 31.13 ng/mL (ref <0.031)
Troponin I: 34.47 ng/mL (ref <0.031)

## 2015-05-18 MED ORDER — ALBUTEROL SULFATE (2.5 MG/3ML) 0.083% IN NEBU
2.5000 mg | INHALATION_SOLUTION | Freq: Four times a day (QID) | RESPIRATORY_TRACT | Status: DC | PRN
  Administered 2015-05-18: 2.5 mg via RESPIRATORY_TRACT
  Filled 2015-05-18: qty 3

## 2015-05-18 MED ORDER — HYDROCOD POLST-CPM POLST ER 10-8 MG/5ML PO SUER
5.0000 mL | Freq: Two times a day (BID) | ORAL | Status: DC | PRN
  Administered 2015-05-18 – 2015-05-21 (×6): 5 mL via ORAL
  Filled 2015-05-18 (×6): qty 5

## 2015-05-18 MED ORDER — HYDROCOD POLST-CPM POLST ER 10-8 MG/5ML PO SUER
5.0000 mL | Freq: Once | ORAL | Status: AC
  Administered 2015-05-18: 5 mL via ORAL
  Filled 2015-05-18: qty 5

## 2015-05-18 MED ORDER — TAMSULOSIN HCL 0.4 MG PO CAPS
0.4000 mg | ORAL_CAPSULE | Freq: Every day | ORAL | Status: DC
  Administered 2015-05-18 – 2015-05-20 (×3): 0.4 mg via ORAL
  Filled 2015-05-18 (×3): qty 1

## 2015-05-18 MED ORDER — FUROSEMIDE 10 MG/ML IJ SOLN
40.0000 mg | Freq: Once | INTRAMUSCULAR | Status: AC
  Administered 2015-05-18: 40 mg via INTRAVENOUS
  Filled 2015-05-18: qty 4

## 2015-05-18 MED ORDER — DOCUSATE SODIUM 100 MG PO CAPS
100.0000 mg | ORAL_CAPSULE | Freq: Two times a day (BID) | ORAL | Status: DC | PRN
  Administered 2015-05-19: 100 mg via ORAL
  Filled 2015-05-18: qty 1

## 2015-05-18 MED ORDER — POTASSIUM CHLORIDE CRYS ER 20 MEQ PO TBCR
40.0000 meq | EXTENDED_RELEASE_TABLET | ORAL | Status: AC
  Administered 2015-05-18 (×2): 40 meq via ORAL
  Filled 2015-05-18 (×2): qty 2

## 2015-05-18 MED ORDER — ZOLPIDEM TARTRATE 5 MG PO TABS
5.0000 mg | ORAL_TABLET | Freq: Every evening | ORAL | Status: DC | PRN
  Administered 2015-05-18 – 2015-05-20 (×3): 5 mg via ORAL
  Filled 2015-05-18 (×3): qty 1

## 2015-05-18 MED ORDER — BENZONATATE 100 MG PO CAPS
100.0000 mg | ORAL_CAPSULE | Freq: Three times a day (TID) | ORAL | Status: DC
  Administered 2015-05-18 – 2015-05-19 (×5): 100 mg via ORAL
  Filled 2015-05-18 (×6): qty 1

## 2015-05-18 MED ORDER — METOPROLOL TARTRATE 25 MG PO TABS
25.0000 mg | ORAL_TABLET | Freq: Two times a day (BID) | ORAL | Status: DC
  Administered 2015-05-18 – 2015-05-21 (×7): 25 mg via ORAL
  Filled 2015-05-18 (×7): qty 1

## 2015-05-18 MED ORDER — FUROSEMIDE 10 MG/ML IJ SOLN
40.0000 mg | Freq: Two times a day (BID) | INTRAMUSCULAR | Status: DC
  Administered 2015-05-18 – 2015-05-19 (×2): 40 mg via INTRAVENOUS
  Filled 2015-05-18: qty 4

## 2015-05-18 NOTE — Progress Notes (Signed)
  Echocardiogram 2D Echocardiogram has been performed.  Arvil Chaco 05/18/2015, 10:16 AM

## 2015-05-18 NOTE — Progress Notes (Signed)
Pt with continued complaints of cough; lungs with expiratory wheezing; RA c sats 97-99%; MD aware; orders received; will continue to closely monitor

## 2015-05-18 NOTE — Progress Notes (Signed)
TELEMETRY: Reviewed telemetry pt in NSR with occ. NSVT 13 beats yesterday at 1 pm. No ectopy since then.  Filed Vitals:   05/18/15 0300 05/18/15 0400 05/18/15 0500 05/18/15 0600  BP: 114/74 127/87 132/91 125/88  Pulse: 88 92 92 93  Temp:  98.2 F (36.8 C)    TempSrc:  Oral    Resp: 23 22 11 21   Height:      Weight:      SpO2: 94% 93% 95% 94%    Intake/Output Summary (Last 24 hours) at 05/18/15 0752 Last data filed at 05/18/15 0600  Gross per 24 hour  Intake 3693.25 ml  Output    895 ml  Net 2798.25 ml   Filed Weights   05/16/15 1724 05/16/15 1830  Weight: 87.9 kg (193 lb 12.6 oz) 87.9 kg (193 lb 12.6 oz)    Subjective Complains of a bad nonproductive cough and increased dyspnea. No chest pain. Unable to sleep due to cough.   [name] aspirin  81 mg Oral Daily  . atorvastatin  80 mg Oral q1800  . famotidine  40 mg Oral QHS  . furosemide  40 mg Intravenous Once  . heparin subcutaneous  5,000 Units Subcutaneous 3 times per day  . metoprolol tartrate  25 mg Oral BID  . pantoprazole  40 mg Oral Daily  . potassium chloride  40 mEq Oral Q4H  . sodium chloride  3 mL Intravenous Q12H  . tamsulosin  0.4 mg Oral QPC supper  . ticagrelor  90 mg Oral BID   . sodium chloride 87.9 mL/hr at 05/17/15 2136    LABS: Basic Metabolic Panel:  Recent Labs  87/56/43 0530 05/18/15 0252  NA 135 135  K 3.6 3.3*  CL 107 105  CO2 23 22  GLUCOSE 168* 102*  BUN 19 13  CREATININE 1.27* 0.95  CALCIUM 7.7* 8.0*   Liver Function Tests:  Recent Labs  05/17/15 0530  AST 161*  ALT 50  ALKPHOS 61  BILITOT 0.8  PROT 5.3*  ALBUMIN 3.2*   No results for input(s): LIPASE, AMYLASE in the last 72 hours. CBC:  Recent Labs  05/17/15 0530 05/18/15 0252  WBC 9.1 9.8  HGB 12.5* 12.5*  HCT 37.9* 37.3*  MCV 93.1 91.6  PLT 182 156   Cardiac Enzymes:  Recent Labs  05/17/15 1047 05/17/15 1953 05/18/15 0252  TROPONINI >65.00* 61.24* 37.73*   BNP: No results for input(s): PROBNP in  the last 72 hours. D-Dimer: No results for input(s): DDIMER in the last 72 hours. Hemoglobin A1C: No results for input(s): HGBA1C in the last 72 hours. Fasting Lipid Panel: No results for input(s): CHOL, HDL, LDLCALC, TRIG, CHOLHDL, LDLDIRECT in the last 72 hours. Thyroid Function Tests: No results for input(s): TSH, T4TOTAL, T3FREE, THYROIDAB in the last 72 hours.  Invalid input(s): FREET3   Radiology/Studies:  Dg Chest Port 1 View  05/17/2015  CLINICAL DATA:  Assess intra-aortic balloon pump placement, acute MI, cardiogenic shock, decreased urine output, status post cardiac catheterization yesterday, coronary angioplasty in July 2015 EXAM: PORTABLE CHEST 1 VIEW COMPARISON:  Portable chest x-ray of May 16, 2015 FINDINGS: The intra-aortic balloon pump tip projects over the lower aspect of the aortic knob similar to that on yesterday's study. The lungs are well-expanded. The interstitial markings are mildly prominent though stable. The heart is normal in size. The pulmonary vascularity is not engorged. IMPRESSION: 1. The intra-aortic balloon pump position appears radiographically stable and appropriate. 2. There is stable minimal interstitial  prominence bilaterally. No acute pulmonary edema is observed. Electronically Signed   By: David  [place] M.D.   On: 05/17/2015 08:09   Dg Chest Port 1 View  05/16/2015  CLINICAL DATA:  Myocardial infarction earlier today EXAM: PORTABLE CHEST 1 VIEW COMPARISON:  Chest CT January 18, 2015 FINDINGS: Intra-aortic balloon pump tip is at the level of T5 just inferior to the aortic arch. No pneumothorax. Lungs are clear. Heart size and pulmonary vascularity are normal. No adenopathy. No bone lesions. IMPRESSION: No edema or consolidation. Intra-aortic balloon pump tip just inferior to the aortic arch. Electronically Signed   By: Bretta Bang III M.D.   On: 05/16/2015 19:16   Ecg: NSR with Q waves inferiorly. Marked ST elevation resolved.   Echo:  pending  PHYSICAL EXAM General: Well developed, well nourished, in no acute distress. Head: Normocephalic, atraumatic, sclera non-icteric, oropharynx is clear Neck: Negative for carotid bruits. JVD not elevated. No adenopathy Lungs: Bilateral wheezes and rales.  Heart: RRR S1 S2 without murmurs, rubs, or gallops.  Abdomen: Soft, non-tender, non-distended with normoactive bowel sounds. No hepatomegaly. No rebound/guarding. No obvious abdominal masses. Msk:  Strength and tone appears normal for age. Extremities: No clubbing, cyanosis or edema.  Distal pedal pulses are 2+ and equal bilaterally. No right groin hematoma.  Neuro: Alert and oriented X 3. Moves all extremities spontaneously. Psych:  Responds to questions appropriately with a normal affect.  ASSESSMENT AND PLAN: 1. Acute inferior STEMI. S/p POBA of the distal RCA with significant no reflow. Associated cardiogenic shock. Will continue DAPT indefinitely. Follow up Echo today. Off IABP and pressors. Will resume beta blocker.    2. NSVT. Secondary to reperfusion arrhythmia. Off amiodarone without recurrence. Continue beta blocker.  3. Cardiogenic shock due to inferior STEMI with possible RV component. Will assess with Echo. BP is stable off support.   4. Hypercholesterolemia. On high dose statin.  5. Acute renal failure due to ATN with shock and contrast. Now resolved. Renal function back to baseline.  6. Acute respiratory failure. Not hypoxic. Suspect pulmonary edema with significant volume loading with cardiogenic shock. Will check CXR. DC IV fluids. Lasix 40 mg IV now. Replete potassium. Give albuterol nebs prn.   Present on Admission:  . Acute ST elevation myocardial infarction (STEMI) (HCC) . Cardiogenic shock (HCC) . CAD (coronary artery disease), native coronary artery . Hyperlipidemia LDL goal <70  Signed, Tacha Manni [place], MDFACC 05/18/2015 7:52 AM

## 2015-05-19 ENCOUNTER — Inpatient Hospital Stay (HOSPITAL_COMMUNITY): Payer: Medicare Other

## 2015-05-19 DIAGNOSIS — E876 Hypokalemia: Secondary | ICD-10-CM

## 2015-05-19 DIAGNOSIS — R05 Cough: Secondary | ICD-10-CM

## 2015-05-19 DIAGNOSIS — R0989 Other specified symptoms and signs involving the circulatory and respiratory systems: Secondary | ICD-10-CM

## 2015-05-19 LAB — BASIC METABOLIC PANEL
Anion gap: 8 (ref 5–15)
BUN: 14 mg/dL (ref 6–20)
CO2: 28 mmol/L (ref 22–32)
Calcium: 8.3 mg/dL — ABNORMAL LOW (ref 8.9–10.3)
Chloride: 96 mmol/L — ABNORMAL LOW (ref 101–111)
Creatinine, Ser: 1.31 mg/dL — ABNORMAL HIGH (ref 0.61–1.24)
GFR calc Af Amer: >60 mL/min (ref >60)
GFR calc non Af Amer: 55 mL/min — ABNORMAL LOW (ref >60)
Glucose, Bld: 92 mg/dL (ref 65–99)
Potassium: 3.1 mmol/L — ABNORMAL LOW (ref 3.5–5.1)
Sodium: 132 mmol/L — ABNORMAL LOW (ref 135–145)

## 2015-05-19 LAB — TROPONIN I
Troponin I: 19.5 ng/mL (ref <0.031)
Troponin I: 20.51 ng/mL (ref <0.031)
Troponin I: 21.51 ng/mL (ref <0.031)
Troponin I: 24.06 ng/mL (ref <0.031)
Troponin I: 35.16 ng/mL (ref <0.031)

## 2015-05-19 MED ORDER — NITROGLYCERIN 2 % TD OINT
0.5000 [in_us] | TOPICAL_OINTMENT | Freq: Four times a day (QID) | TRANSDERMAL | Status: AC
Start: 2015-05-19 — End: 2015-05-19
  Administered 2015-05-19 (×2): 0.5 [in_us] via TOPICAL
  Filled 2015-05-19: qty 30

## 2015-05-19 MED ORDER — POTASSIUM CHLORIDE CRYS ER 20 MEQ PO TBCR
40.0000 meq | EXTENDED_RELEASE_TABLET | Freq: Once | ORAL | Status: AC
  Administered 2015-05-19: 40 meq via ORAL
  Filled 2015-05-19: qty 2

## 2015-05-19 NOTE — Progress Notes (Signed)
CRITICAL VALUE ALERT  Critical value received:  Troponin  Date of notification:  05/19/15  Time of notification:  1950  Critical value read back:Yes.    Nurse who received alert:  Alycia Rossetti  MD notified (1st page): Virgina Organ  Time of first page:  1950  Responding MD:  Virgina Organ  Time MD responded:  [phone]

## 2015-05-19 NOTE — Progress Notes (Signed)
SUBJECTIVE:  Still with a cough, but it is significantly improved. He has continued to diurese quite well after IV Lasix.  OBJECTIVE:   Vitals:   Filed Vitals:   05/19/15 0000 05/19/15 0400 05/19/15 0800 05/19/15 0801  BP:  83/74 121/84 121/84  Pulse:  99 95 100  Temp: 97.8 F (36.6 C) 98.3 F (36.8 C)  99.1 F (37.3 C)  TempSrc: Oral Oral  Oral  Resp:    20  Height:      Weight:      SpO2:  94% 93% 93%   I&O's:   Intake/Output Summary (Last 24 hours) at 05/19/15 0908 Last data filed at 05/19/15 6063  Gross per 24 hour  Intake    935 ml  Output   5550 ml  Net  -4615 ml   TELEMETRY: Reviewed telemetry pt in normal sinus rhythm, sinus tachycardia:     PHYSICAL EXAM General: Well developed, well nourished, in no acute distress Head:   Normal cephalic and atramatic  Lungs:  Audible expiratory wheezing Heart:  HRRR S1 S2  No JVD.   Abdomen: abdomen soft and non-tender Msk:  Back normal,  Normal strength and tone for age. Extremities:   No edema.  Right groin site without hematoma, only mild bruising Neuro: Alert and oriented. Psych:  Normal affect, responds appropriately Skin: No rash   LABS: Basic Metabolic Panel:  Recent Labs  01/60/10 0252 05/19/15 0636  NA 135 132*  K 3.3* 3.1*  CL 105 96*  CO2 22 28  GLUCOSE 102* 92  BUN 13 14  CREATININE 0.95 1.31*  CALCIUM 8.0* 8.3*   Liver Function Tests:  Recent Labs  05/17/15 0530  AST 161*  ALT 50  ALKPHOS 61  BILITOT 0.8  PROT 5.3*  ALBUMIN 3.2*   No results for input(s): LIPASE, AMYLASE in the last 72 hours. CBC:  Recent Labs  05/17/15 0530 05/18/15 0252  WBC 9.1 9.8  HGB 12.5* 12.5*  HCT 37.9* 37.3*  MCV 93.1 91.6  PLT 182 156   Cardiac Enzymes:  Recent Labs  05/18/15 1700 05/18/15 2342 05/19/15 0636  TROPONINI 34.47* 35.16* 24.06*   BNP: Invalid input(s): POCBNP D-Dimer: No results for input(s): DDIMER in the last 72 hours. Hemoglobin A1C: No results for input(s): HGBA1C  in the last 72 hours. Fasting Lipid Panel: No results for input(s): CHOL, HDL, LDLCALC, TRIG, CHOLHDL, LDLDIRECT in the last 72 hours. Thyroid Function Tests: No results for input(s): TSH, T4TOTAL, T3FREE, THYROIDAB in the last 72 hours.  Invalid input(s): FREET3 Anemia Panel: No results for input(s): VITAMINB12, FOLATE, FERRITIN, TIBC, IRON, RETICCTPCT in the last 72 hours. Coag Panel:   Lab Results  Component Value Date   INR 1.03 02/02/2014    RADIOLOGY: Dg Chest Port 1 View  05/19/2015  CLINICAL DATA:  Congestive heart failure EXAM: PORTABLE CHEST 1 VIEW COMPARISON:  Radiograph 05/18/2015 FINDINGS: Normal mediastinum and cardiac silhouette. Normal pulmonary vasculature. Improvement in central venous congestion seen on comparison exam. No evidence of effusion, infiltrate, or pneumothorax. No acute bony abnormality. IMPRESSION: Improved central venous congestion. Electronically Signed   By: Genevive Bi M.D.   On: 05/19/2015 07:50   Dg Chest Port 1 View  05/18/2015  CLINICAL DATA:  Cough.  Shortness of breath EXAM: PORTABLE CHEST 1 VIEW COMPARISON:  05/17/2015 FINDINGS: There is progressive interstitial coarsening which is symmetric and likely pulmonary edema given the clinical circumstances. Normal heart size and mediastinal contours. No effusion or pneumothorax. An aortic balloon  pump has been removed. IMPRESSION: New pulmonary edema. Electronically Signed   By: Marnee Spring M.D.   On: 05/18/2015 09:01   Dg Chest Port 1 View  05/17/2015  CLINICAL DATA:  Assess intra-aortic balloon pump placement, acute MI, cardiogenic shock, decreased urine output, status post cardiac catheterization yesterday, coronary angioplasty in July 2015 EXAM: PORTABLE CHEST 1 VIEW COMPARISON:  Portable chest x-ray of May 16, 2015 FINDINGS: The intra-aortic balloon pump tip projects over the lower aspect of the aortic knob similar to that on yesterday's study. The lungs are well-expanded. The  interstitial markings are mildly prominent though stable. The heart is normal in size. The pulmonary vascularity is not engorged. IMPRESSION: 1. The intra-aortic balloon pump position appears radiographically stable and appropriate. 2. There is stable minimal interstitial prominence bilaterally. No acute pulmonary edema is observed. Electronically Signed   By: David  [place] M.D.   On: 05/17/2015 08:09   Dg Chest Port 1 View  05/16/2015  CLINICAL DATA:  Myocardial infarction earlier today EXAM: PORTABLE CHEST 1 VIEW COMPARISON:  Chest CT January 18, 2015 FINDINGS: Intra-aortic balloon pump tip is at the level of T5 just inferior to the aortic arch. No pneumothorax. Lungs are clear. Heart size and pulmonary vascularity are normal. No adenopathy. No bone lesions. IMPRESSION: No edema or consolidation. Intra-aortic balloon pump tip just inferior to the aortic arch. Electronically Signed   By: Bretta Bang III M.D.   On: 05/16/2015 19:16      ASSESSMENT: Henry Rogers:    1) status post inferior MI complicated by no reflow phenomenon. Cardiogenic shock treated with balloon pump and pressors. Blood pressure much more stable now. Off pressors and balloon pump. He did have evidence of vascular congestion on his chest x-ray. This morning, this is better.  No angina. Overall, he feels that he is improving. He was able to lie flat last night. EF 55-60% by echo.  2) acute renal insufficiency:  This was thought to be due to ATN and hypotension. Creatinine had improved. With the IV Lasix, creatinine is increased back to 1.3. Will stop IV Lasix. He is still coughing which may be indicative of some pulmonary congestion. Will place low-dose Nitropaste on the patient for 12 hours today to see if this helps with relieving vascular congestion, while not affecting the kidneys. Blood pressure should tolerate.  3) hypokalemia: Replace potassium. He has had a very rapid diuresis with being negative over 4 L in the past 24 hours.  This should improve with less Lasix. If creatinine stabilizes, may be able to give him a dose of Lasix tomorrow.  Corky Crafts, MD  05/19/2015  9:08 AM

## 2015-05-19 NOTE — Progress Notes (Signed)
[id number] Pt ambulated independently with wife at his side. PCI education completed with pt and pt's wife including restrictions, risk factor modification, CP, NTG use, & calling 911 and Brilinta use, heart healthy diet given and exercise guidelines given. Pt verbalized understanding of instructions given. Discussed Phase 2 cardiac rehab, and pt is interested in participating in the program. Permission given to send contact info to the CR program at [facility].  Artist Pais, MS, ACSM CCEP

## 2015-05-20 LAB — BASIC METABOLIC PANEL
Anion gap: 10 (ref 5–15)
BUN: 15 mg/dL (ref 6–20)
CO2: 28 mmol/L (ref 22–32)
Calcium: 8.6 mg/dL — ABNORMAL LOW (ref 8.9–10.3)
Chloride: 97 mmol/L — ABNORMAL LOW (ref 101–111)
Creatinine, Ser: 1.13 mg/dL (ref 0.61–1.24)
GFR calc Af Amer: >60 mL/min (ref >60)
GFR calc non Af Amer: >60 mL/min (ref >60)
Glucose, Bld: 96 mg/dL (ref 65–99)
Potassium: 3.1 mmol/L — ABNORMAL LOW (ref 3.5–5.1)
Sodium: 135 mmol/L (ref 135–145)

## 2015-05-20 LAB — TROPONIN I
Troponin I: 16.69 ng/mL (ref <0.031)
Troponin I: 10.94 ng/mL (ref <0.031)
Troponin I: 11.95 ng/mL (ref <0.031)
Troponin I: 13.35 ng/mL (ref <0.031)

## 2015-05-20 MED ORDER — POTASSIUM CHLORIDE 10 MEQ/100ML IV SOLN: 10.0000 meq | INTRAVENOUS | Status: DC

## 2015-05-20 MED ORDER — ALBUTEROL SULFATE HFA 108 (90 BASE) MCG/ACT IN AERS
2.0000 | INHALATION_SPRAY | Freq: Four times a day (QID) | RESPIRATORY_TRACT | Status: DC | PRN
  Filled 2015-05-20: qty 6.7

## 2015-05-20 MED ORDER — POTASSIUM CHLORIDE CRYS ER 20 MEQ PO TBCR
40.0000 meq | EXTENDED_RELEASE_TABLET | Freq: Once | ORAL | Status: AC
  Administered 2015-05-20: 40 meq via ORAL
  Filled 2015-05-20: qty 2

## 2015-05-20 MED ORDER — BENZONATATE 100 MG PO CAPS
200.0000 mg | ORAL_CAPSULE | Freq: Three times a day (TID) | ORAL | Status: DC
  Administered 2015-05-20 – 2015-05-21 (×4): 200 mg via ORAL
  Filled 2015-05-20 (×4): qty 2

## 2015-05-20 NOTE — Progress Notes (Signed)
Patient has been up and ambulating on unit all day, he has been alert and oriented, no signs of distress or discomfort, his wife has been walking with him as well.  He continues to complain of dry/hacking cough with some relief while walking.  Will continue to monitor

## 2015-05-20 NOTE — Progress Notes (Signed)
Patient ID: Henry Rogers, male   DOB: [birth year], 64 y.o.   MRN: [id number]    Primary cardiologist:  Subjective:    No events overnight. Continues to have nonproductive cough.   Objective:   Temp:  [98.6 F (37 C)-99.5 F (37.5 C)] 98.6 F (37 C) (10/30 0335) Pulse Rate:  [80-100] 84 (10/30 0335) Resp:  [16-20] 18 (10/29 1543) BP: (95-121)/(66-84) 107/83 mmHg (10/30 0335) SpO2:  [91 %-95 %] 95 % (10/30 0335) Last BM Date: 05/19/15  Filed Weights   05/16/15 1724 05/16/15 1830  Weight: 193 lb 12.6 oz (87.9 kg) 193 lb 12.6 oz (87.9 kg)    Intake/Output Summary (Last 24 hours) at 05/20/15 0657 Last data filed at 05/19/15 2200  Gross per 24 hour  Intake    720 ml  Output   3175 ml  Net  -2455 ml    Telemetry: NSR  Exam:  General: NAD  Resp: CTAB  Cardiac: RRR, no m/r/g, no jvd  GI: abdomen soft, NT, ND  MSK: no LE edema  Neuro: no focal deficits  Psych: approp affect  Lab Results:  Basic Metabolic Panel:  Recent Labs Lab 05/18/15 0252 05/19/15 0636 05/20/15 0455  NA 135 132* 135  K 3.3* 3.1* 3.1*  CL 105 96* 97*  CO2 22 28 28   GLUCOSE 102* 92 96  BUN 13 14 15   CREATININE 0.95 1.31* 1.13  CALCIUM 8.0* 8.3* 8.6*    Liver Function Tests:  Recent Labs Lab 05/17/15 0530  AST 161*  ALT 50  ALKPHOS 61  BILITOT 0.8  PROT 5.3*  ALBUMIN 3.2*    CBC:  Recent Labs Lab 05/16/15 1845 05/16/15 2012 05/17/15 0530 05/18/15 0252  WBC 15.4*  --  9.1 9.8  HGB 12.6*  --  12.5* 12.5*  HCT 37.0*  --  37.9* 37.3*  MCV 90.7  --  93.1 91.6  PLT 182 177 182 156    Cardiac Enzymes:  Recent Labs Lab 05/19/15 1800 05/19/15 2241 05/20/15 0455  TROPONINI 20.51* 21.51* 16.69*    BNP: No results for input(s): PROBNP in the last 8760 hours.  Coagulation: No results for input(s): INR in the last 168 hours.  ECG:   Medications:   Scheduled Medications: . aspirin  81 mg Oral Daily  . atorvastatin  80 mg Oral q1800  . benzonatate  100 mg  Oral TID  . famotidine  40 mg Oral QHS  . heparin subcutaneous  5,000 Units Subcutaneous 3 times per day  . metoprolol tartrate  25 mg Oral BID  . pantoprazole  40 mg Oral Daily  . potassium chloride  10 mEq Intravenous Q1 Hr x 2  . potassium chloride  40 mEq Oral Once  . sodium chloride  3 mL Intravenous Q12H  . tamsulosin  0.4 mg Oral QPC supper  . ticagrelor  90 mg Oral BID     Infusions:     PRN Medications:  sodium chloride, acetaminophen, albuterol, chlorpheniramine-HYDROcodone, docusate sodium, guaiFENesin, ondansetron (ZOFRAN) IV, sodium chloride, zolpidem     Assessment/Plan    1. STEMI - admit with inferior MI and cardiogenic shock - s/p PTCA of distal RCA, procedure complicated by heavy thrombus and poor reflow - initially on pressors and IABP, now weaned off - echo 05/18/15 LVEF 55-60%, akinesis basal inferior wall, grade II diastolic dysfunction.  - med therapy wih ASA, atorva 80, lopressor 25 bid, brillinta.No ACE-I due to labile renal function.   2. Acute diastolic HF - evidence of  pulm edema by CXR  - improved with IV lasix with significant diuresis, off diuretics due to increasing Cr  3. AKI - likely secondary to hypotension, ATN  4. NSVT - now off amiodarone. Beta blocker restarted now that more hemodynamically stable.   5. Cough - nonproductive cough, no fevers or chills. No WBC - CXR clear - continue tessalon perls  6. Hypokalemia - written for replacment  Transfer to tele today.   Dina Rich, M.D.

## 2015-05-21 ENCOUNTER — Encounter (HOSPITAL_COMMUNITY): Payer: Self-pay | Admitting: Physician Assistant

## 2015-05-21 ENCOUNTER — Telehealth: Payer: Self-pay | Admitting: Cardiology

## 2015-05-21 DIAGNOSIS — N179 Acute kidney failure, unspecified: Secondary | ICD-10-CM

## 2015-05-21 DIAGNOSIS — G4733 Obstructive sleep apnea (adult) (pediatric): Secondary | ICD-10-CM | POA: Diagnosis present

## 2015-05-21 DIAGNOSIS — I5033 Acute on chronic diastolic (congestive) heart failure: Secondary | ICD-10-CM

## 2015-05-21 DIAGNOSIS — E785 Hyperlipidemia, unspecified: Secondary | ICD-10-CM

## 2015-05-21 DIAGNOSIS — I5032 Chronic diastolic (congestive) heart failure: Secondary | ICD-10-CM | POA: Diagnosis present

## 2015-05-21 DIAGNOSIS — I480 Paroxysmal atrial fibrillation: Secondary | ICD-10-CM | POA: Diagnosis present

## 2015-05-21 DIAGNOSIS — I2510 Atherosclerotic heart disease of native coronary artery without angina pectoris: Secondary | ICD-10-CM | POA: Diagnosis present

## 2015-05-21 LAB — BASIC METABOLIC PANEL
Anion gap: 7 (ref 5–15)
BUN: 14 mg/dL (ref 6–20)
CO2: 26 mmol/L (ref 22–32)
Calcium: 9 mg/dL (ref 8.9–10.3)
Chloride: 103 mmol/L (ref 101–111)
Creatinine, Ser: 1.11 mg/dL (ref 0.61–1.24)
GFR calc Af Amer: >60 mL/min (ref >60)
GFR calc non Af Amer: >60 mL/min (ref >60)
Glucose, Bld: 104 mg/dL — ABNORMAL HIGH (ref 65–99)
Potassium: 3.9 mmol/L (ref 3.5–5.1)
Sodium: 136 mmol/L (ref 135–145)

## 2015-05-21 LAB — CBC
HCT: 36.8 % — ABNORMAL LOW (ref 39.0–52.0)
Hemoglobin: 12.8 g/dL — ABNORMAL LOW (ref 13.0–17.0)
MCH: 31.4 pg (ref 26.0–34.0)
MCHC: 34.8 g/dL (ref 30.0–36.0)
MCV: 90.2 fL (ref 78.0–100.0)
Platelets: 196 10*3/uL (ref 150–400)
RBC: 4.08 MIL/uL — ABNORMAL LOW (ref 4.22–5.81)
RDW: 12.9 % (ref 11.5–15.5)
WBC: 5.7 10*3/uL (ref 4.0–10.5)

## 2015-05-21 LAB — TROPONIN I: Troponin I: 8.58 ng/mL (ref <0.031)

## 2015-05-21 MED ORDER — TICAGRELOR 90 MG PO TABS: 90.0000 mg | ORAL_TABLET | Freq: Two times a day (BID) | ORAL | Status: DC

## 2015-05-21 MED ORDER — HYDROCOD POLST-CPM POLST ER 10-8 MG/5ML PO SUER: 5.0000 mL | Freq: Two times a day (BID) | ORAL | Status: DC | PRN

## 2015-05-21 MED ORDER — METOPROLOL TARTRATE 25 MG PO TABS: 25.0000 mg | ORAL_TABLET | Freq: Two times a day (BID) | ORAL | Status: DC

## 2015-05-21 MED ORDER — ZOLPIDEM TARTRATE 5 MG PO TABS: 5.0000 mg | ORAL_TABLET | Freq: Every evening | ORAL | Status: DC | PRN

## 2015-05-21 MED ORDER — FUROSEMIDE 20 MG PO TABS: 20.0000 mg | ORAL_TABLET | Freq: Every day | ORAL | Status: DC

## 2015-05-21 MED ORDER — BENZONATATE 200 MG PO CAPS: 200.0000 mg | ORAL_CAPSULE | Freq: Three times a day (TID) | ORAL | Status: DC

## 2015-05-21 NOTE — Discharge Instructions (Signed)

## 2015-05-21 NOTE — Telephone Encounter (Signed)
Patient contacted regarding discharge from [facility] on 05/21/15.  Patient understands to follow up with provider Cline Crock on 05/30/15 at 10:00 am at [facility]. Patient understands discharge instructions? Yes Patient understands medications and regiment? Yes Patient understands to bring all medications to this visit? Yes

## 2015-05-21 NOTE — Progress Notes (Signed)
1040-1100 Checked with pt and wife to see if questions re education done Saturday. Pt stated has done CRP 2 before and has been through this before. Wife with questions re arrhythmia pt had last night, cholesterol, brilinta importance reviewed.  Referring to [facility] Phase 2. Luetta Nutting RN BSN 05/21/2015 11:05 AM

## 2015-05-21 NOTE — Care Management Note (Addendum)
Case Management Note  Patient Details  Name: Henry Rogers MRN: [id number] Date of Birth: [birth year]  Subjective/Objective:      Pt admitted for [facility]- Plan for home on Brilinta. CM did call [facility] and medication is not available. CM did call [facility] on [address] and they have enough to get the patient started. Benefits check in process and will make pt aware once completed.              Action/Plan: Pt was provided 30 day free card. Pt will need Rx for 30 day free no refills and the original Rx with refills. No further needs from CM at this time.    Expected Discharge Date:                  Expected Discharge Plan:  Home/Self Care  In-House Referral:  NA  Discharge planning Services  CM Consult, Medication Assistance  Post Acute Care Choice:  NA Choice offered to:  NA  DME Arranged:  N/A DME Agency:  NA  HH Arranged:  NA HH Agency:  NA  Status of Service:  Completed, signed off  Medicare Important Message Given:    Date Medicare IM Given:    Medicare IM give by:    Date Additional Medicare IM Given:    Additional Medicare Important Message give by:     If discussed at Long Length of Stay Meetings, dates discussed:    Additional Comments: S/W ROBERT @ [employer] [facility] # [phone]   BRILINTA 90 MG BID   COVER- YES  CO-PAY- $ 38.70 30 DAY SUPPLY  TIER- 2 DRUG  PRIOR APPROVAL- NO  PHARMACY- ANY RETAIL  Gala Lewandowsky, RN 05/21/2015, 9:49 AM

## 2015-05-21 NOTE — Telephone Encounter (Signed)
TCM  Per Carlean Jews   Appt on 05/30/2015 @ 10am [facility]

## 2015-05-21 NOTE — Care Management Important Message (Signed)
Important Message  Patient Details  Name: Henry Rogers MRN: [id number] Date of Birth: 1951-04-12   Medicare Important Message Given:  Yes-second notification given    Kyla Balzarine 05/21/2015, 5:32 PM

## 2015-05-21 NOTE — Discharge Summary (Signed)
Discharge Summary   Patient ID: Henry Rogers MRN: [id number], DOB/AGE: 1950/09/23 64 y.o. Admit date: 05/16/2015 D/C date:     05/21/2015  Primary Cardiologist: Dr. Swaziland  Principal Problem:   Acute ST elevation myocardial infarction (STEMI) [facility])   Cardiogenic shock (HCC)   Acute on chronic diastolic CHF (congestive heart failure), NYHA class 1 (HCC)   PAF (paroxysmal atrial fibrillation) (HCC)   AKI (acute kidney injury) (HCC) Active Problems:   GERD (gastroesophageal reflux disease)   BPH (benign prostatic hyperplasia)   Hyperlipidemia LDL goal <70   Metabolic syndrome   Chronic diastolic CHF (congestive heart failure) (HCC)   CAD (coronary artery disease)   OSA (obstructive sleep apnea)    Admission Dates: 05/16/15-05/21/15 Discharge Diagnosis: inferior STEMI w/ cardiogenic shock s/p PCTA to RCA  HPI: Henry Rogers is a 64 y.o. male with a history of CAD s/p inf STEMI s/p BMS to RCA (01/2014), HLD, GERD, mild OSA, metabolic syndrome and ischemic CM ( EF previously 45-50%) who presented to [facility] on 05/16/15 w/ inferior STEMI c/b cardiogenic shock.  He has a hx of CAD w/ previous inferior wall STEMI 02/03/2014 treated with a BMS by Dr. Swaziland. The right coronary is ectatic/very large in diameter and was treated with a 5.0 bare-metal stent. Brilinta was discontinued in July 2016. Patient did well since that time until 05/05/14 when he began to develop chest discomfort while at work. Shortly thereafter he took a SL NTG x2. EMS was then called and EKG performed in route to the hospital revealed inferior ST elevation infarction. Upon arrival in the ambulance bay, the patient was in shock with a last recorded SBP of 65 mmHg and in 8/10 chest pain and he was brought straight to the cath lab.   Hospital Course  Completed inferoposterior STEMI complicated by cardiogenic shock s/p SL NTG x2 -- s/p emergent cardiac catheterization with PTCA of distal RCA, procedure complicated by  heavy thrombus and poor reflow. He was initially on pressors and IABP, now weaned off and HD stable.  -- 2D echo on 05/18/15 LVEF 55-60%, akinesis basal inferior wall, grade II diastolic dysfunction.  -- Continue med therapy wih ASA, atorva 80, lopressor 25 bid, brillinta. No ACE-I due to low BPs  Acute on chronic diastolic HF -- 2D echo on 05/18/15 LVEF 55-60%, akinesis basal inferior wall, grade II diastolic dysfunction. -- Evidence of pulm edema by CXR. He improved with IV lasix with significant diuresis. Lasix discontinued due to increasing Cr -- DC home with low dose loop diuretic (furosemide 20 mg PO daily)  Brief post-cath atrial fibrillation- patient's acute STEMI was complicated by acute atrial fibrillation which resolved s/p IV amiodarone. Currently in NSR  AKI- likely secondary to hypotension, ATN -- Renal function now completely recovered.  NSVT - He had recurrent NSVT. He was initially placed on IV amiodarone for brief acute afib but this was later discontinued. Beta blocker was restarted once he became more hemodynamically stable. Now on Lopressor  BID and much better controlled.  Cough- this preceeded his acute coronary event and is likely due to URI. -- CXR clear -- Continue tessalon perls, tussinex and ambien ( to help him sleep at night)  Hypokalemia -- Now resolved after supplementation.    The patient has had an uncomplicated hospital course and is recovering well. The femoral catheter site is stable. He has been seen by Dr. Royann Shivers today and deemed ready for discharge home. All follow-up appointments have been scheduled. Discharge  medications are listed below.   Discharge Vitals: Blood pressure 118/71, pulse 91, temperature 98.5 F (36.9 C), temperature source Oral, resp. rate 17, height 5\' 6"  (1.676 m), weight 195 lb 4.8 oz (88.587 kg), SpO2 94 %.  Labs: Lab Results  Component Value Date   WBC 5.7 05/21/2015   HGB 12.8* 05/21/2015   HCT 36.8*  05/21/2015   MCV 90.2 05/21/2015   PLT 196 05/21/2015    Recent Labs Lab 05/17/15 0530  05/21/15 0523  NA 135  < > 136  K 3.6  < > 3.9  CL 107  < > 103  CO2 23  < > 26  BUN 19  < > 14  CREATININE 1.27*  < > 1.11  CALCIUM 7.7*  < > 9.0  PROT 5.3*  --   --   BILITOT 0.8  --   --   ALKPHOS 61  --   --   ALT 50  --   --   AST 161*  --   --   GLUCOSE 168*  < > 104*  < > = values in this interval not displayed.  Recent Labs  05/20/15 1130 05/20/15 1724 05/20/15 2257 05/21/15 0523  TROPONINI 10.94* 11.95* 13.35* 8.58*   Lab Results  Component Value Date   CHOL 115 01/03/2015   HDL 37* 01/03/2015   LDLCALC 51 01/03/2015   TRIG 134 01/03/2015     Diagnostic Studies/Procedures   Dg Chest Port 1 View  05/19/2015  CLINICAL DATA:  Congestive heart failure EXAM: PORTABLE CHEST 1 VIEW COMPARISON:  Radiograph 05/18/2015 FINDINGS: Normal mediastinum and cardiac silhouette. Normal pulmonary vasculature. Improvement in central venous congestion seen on comparison exam. No evidence of effusion, infiltrate, or pneumothorax. No acute bony abnormality. IMPRESSION: Improved central venous congestion. Electronically Signed   By: Genevive Bi M.D.   On: 05/19/2015 07:50   Dg Chest Port 1 View  05/18/2015  CLINICAL DATA:  Cough.  Shortness of breath EXAM: PORTABLE CHEST 1 VIEW COMPARISON:  05/17/2015 FINDINGS: There is progressive interstitial coarsening which is symmetric and likely pulmonary edema given the clinical circumstances. Normal heart size and mediastinal contours. No effusion or pneumothorax. An aortic balloon pump has been removed. IMPRESSION: New pulmonary edema. Electronically Signed   By: Marnee Spring M.D.   On: 05/18/2015 09:01   Dg Chest Port 1 View  05/17/2015  CLINICAL DATA:  Assess intra-aortic balloon pump placement, acute MI, cardiogenic shock, decreased urine output, status post cardiac catheterization yesterday, coronary angioplasty in July 2015 EXAM: PORTABLE  CHEST 1 VIEW COMPARISON:  Portable chest x-ray of May 16, 2015 FINDINGS: The intra-aortic balloon pump tip projects over the lower aspect of the aortic knob similar to that on yesterday's study. The lungs are well-expanded. The interstitial markings are mildly prominent though stable. The heart is normal in size. The pulmonary vascularity is not engorged. IMPRESSION: 1. The intra-aortic balloon pump position appears radiographically stable and appropriate. 2. There is stable minimal interstitial prominence bilaterally. No acute pulmonary edema is observed. Electronically Signed   By: David  Swaziland M.D.   On: 05/17/2015 08:09   Dg Chest Port 1 View  05/16/2015  CLINICAL DATA:  Myocardial infarction earlier today EXAM: PORTABLE CHEST 1 VIEW COMPARISON:  Chest CT January 18, 2015 FINDINGS: Intra-aortic balloon pump tip is at the level of T5 just inferior to the aortic arch. No pneumothorax. Lungs are clear. Heart size and pulmonary vascularity are normal. No adenopathy. No bone lesions. IMPRESSION: No edema or  consolidation. Intra-aortic balloon pump tip just inferior to the aortic arch. Electronically Signed   By: Bretta Bang III M.D.   On: 05/16/2015 19:16    LHC 05/16/15 Conclusion    1. Mid RCA to Dist RCA lesion, 10% stenosed. The lesion was previously treated with a stent (unknown type). 2. Prox RCA to Mid RCA lesion, 25% stenosed. 3. Prox LAD to Dist LAD lesion, 40% stenosed. 4. Dist RCA lesion, 100% stenosed. Post intervention, there is a 10% residual stenosis.  Acute inferior myocardial infarction presenting with cardiogenic shock.  PTCA of the distal RCA from 100% to less than 10%. The vessel is large and ectatic containing heavy thrombus. Reperfusion was complicated by severe "no reflow".  Widely patent left coronary system.  Intra-aortic balloon pump to improve diastolic flow and help resolve "no reflow".  Left ventriculography was not  performed  RECOMMENDATIONS:  One-to-one intra-aortic balloon, pulsation for at least 24 hours.  Intravenous Levophed to keep a mean arterial pressure greater than 65 mmHg.  Intravenous Aggrastat for at least 24 hours  Aspirin and Brilinta indefinitely.  IV amiodarone to treat acute atrial fibrillation.  Prognosis is guarded. This was discussed with the patient's family in full detail.  IV heparin without a bolus to start in 8 hours.  Beta blocker therapy as tolerated by blood pressure.  2-D Doppler echocardiogram 24-48 hours hence.  Unfortunately, the patient will probably have a rocky post infarct course with potential for brady- and ventricular arrhythmias.     Indications    Cardiogenic shock (HCC) [R57.0 (ICD-10-CM)]   Acute ST elevation myocardial infarction (STEMI) involving other coronary artery of inferior wall (HCC) [I21.19 (ICD-10-CM)]           Coronary Findings    Dominance: Right   Left Anterior Descending   . Prox LAD to Dist LAD lesion, 40% stenosed. eccentric.   . First Diagonal Branch   The vessel is small in size.     Ramus Intermedius  . Vessel is small.     Left Circumflex   . First Obtuse Marginal Branch   The vessel is small in size.   [name] Second Obtuse Marginal Branch   The vessel is small in size.     Right Coronary Artery   . Prox RCA to Mid RCA lesion, 25% stenosed. The lesion is type C diffuse eccentric.   . Mid RCA to Dist RCA lesion, 10% stenosed. with heavy thrombus. The lesion was previously treated with a stent (unknown type).   . Dist RCA lesion, 100% stenosed. The lesion is type C thrombotic eccentric.   [name] PCI: There is no pre-interventional antegrade distal flow (TIMI 0). Angioplasty alone was performed. A stent was not placed. The post-interventional distal flow is decreased (TIMI 1). The intervention was successful. The patient experienced the no reflow phenomenon in this vessel.  . There is a 10% residual stenosis post  intervention.        2D ECHO: 05/18/2015 LV EF: 55-60% Study Conclusions - Left ventricle: The cavity size was normal. Systolic function was normal. The estimated ejection fraction was in the range of 55% to 60%. There is akinesis of the basalinferior myocardium. There is hypokinesis of the basal-midinferolateral myocardium. Features are consistent with a pseudonormal left ventricular filling pattern, with concomitant abnormal relaxation and increased filling pressure (grade 2 diastolic dysfunction). Doppler parameters are consistent with high ventricular filling pressure. - Aortic valve: Moderate thickening and calcification, consistent with sclerosis. - Mitral valve: Calcified annulus. Mildly  thickened leaflets . There was mild regurgitation.    Discharge Medications     Medication List    STOP taking these medications        ibuprofen 200 MG tablet  Commonly known as:  ADVIL,MOTRIN      TAKE these medications        aspirin 81 MG tablet  Take 81 mg by mouth daily.     atorvastatin 80 MG tablet  Commonly known as:  LIPITOR  Take 1 tablet (80 mg total) by mouth daily at 6 PM.     benzonatate 200 MG capsule  Commonly known as:  TESSALON  Take 1 capsule (200 mg total) by mouth 3 (three) times daily.     chlorpheniramine-HYDROcodone 10-8 MG/5ML Suer  Commonly known as:  TUSSIONEX  Take 5 mLs by mouth every 12 (twelve) hours as needed for cough.     furosemide 20 MG tablet  Commonly known as:  LASIX  Take 1 tablet (20 mg total) by mouth daily.     JUICE PLUS FIBRE PO  Take 2 tablets by mouth 2 (two) times daily. Fruit blend     Melatonin 10 MG Caps  Take 10 mg by mouth at bedtime as needed (for sleep).     metoprolol tartrate 25 MG tablet  Commonly known as:  LOPRESSOR  Take 1 tablet (25 mg total) by mouth 2 (two) times daily.     multivitamin tablet  Take 1 tablet by mouth daily.     nitroGLYCERIN 0.4 MG SL tablet  Commonly known  as:  NITROSTAT  Place 1 tablet (0.4 mg total) under the tongue every 5 (five) minutes x 3 doses as needed for chest pain.     omeprazole 40 MG capsule  Commonly known as:  PRILOSEC  Take 1 capsule (40 mg total) by mouth daily.     ranitidine 75 MG tablet  Commonly known as:  ZANTAC  Take 75 mg by mouth at bedtime.     tamsulosin 0.4 MG Caps capsule  Commonly known as:  FLOMAX  TAKE 2 CAPSULES BY MOUTH DAILY     ticagrelor 90 MG Tabs tablet  Commonly known as:  BRILINTA  Take 1 tablet (90 mg total) by mouth 2 (two) times daily.     zolpidem 5 MG tablet  Commonly known as:  AMBIEN  Take 1 tablet (5 mg total) by mouth at bedtime as needed for sleep.        Disposition   The patient will be discharged in stable condition to home. Discharge Instructions    Amb Referral to Cardiac Rehabilitation    Complete by:  As directed   Diagnosis:  PCI          Follow-up Information    Follow up with Janetta Hora, PA-C On 05/30/2015.   Specialties:  Cardiology, Radiology   Why:  @ 10am    Contact information:   [address] [place] [address] [phone]         Duration of Discharge Encounter: Greater than 30 minutes including physician and PA time.  Byrd Hesselbach R PA-C 05/21/2015, 1:41 PM

## 2015-05-21 NOTE — Progress Notes (Signed)
Patient Name: Henry Rogers Date of Encounter: 05/21/2015  Principal Problem:   Acute ST elevation myocardial infarction (STEMI) [facility]) Active Problems:   Hyperlipidemia LDL goal <70   Cardiogenic shock (HCC)   CAD (coronary artery disease), native coronary artery   Paroxysmal atrial fibrillation (HCC)   Length of Stay: 5  SUBJECTIVE  No angina. Able to walk 4 laps without dyspnea or cough. Has recumbent cough, but productive of yellowish mucus. NSVT x 5 beats, 2 episodes last night. Asymptomatic.  CURRENT MEDS . aspirin  81 mg Oral Daily  . atorvastatin  80 mg Oral q1800  . benzonatate  200 mg Oral TID  . famotidine  40 mg Oral QHS  . heparin subcutaneous  5,000 Units Subcutaneous 3 times per day  . metoprolol tartrate  25 mg Oral BID  . pantoprazole  40 mg Oral Daily  . sodium chloride  3 mL Intravenous Q12H  . tamsulosin  0.4 mg Oral QPC supper  . ticagrelor  90 mg Oral BID    OBJECTIVE   Intake/Output Summary (Last 24 hours) at 05/21/15 1025 Last data filed at 05/21/15 0845  Gross per 24 hour  Intake    480 ml  Output      0 ml  Net    480 ml   Filed Weights   05/16/15 1724 05/16/15 1830 05/21/15 0420  Weight: 193 lb 12.6 oz (87.9 kg) 193 lb 12.6 oz (87.9 kg) 195 lb 4.8 oz (88.587 kg)    PHYSICAL EXAM Filed Vitals:   05/20/15 1200 05/20/15 1358 05/20/15 2004 05/21/15 0420  BP: 106/80 101/73 112/75 118/71  Pulse: 76 90 84 91  Temp: 98 F (36.7 C) 97.9 F (36.6 C) 98.3 F (36.8 C) 98.5 F (36.9 C)  TempSrc: Oral Oral Oral Oral  Resp: 17 17    Height:      Weight:    195 lb 4.8 oz (88.587 kg)  SpO2: 98% 97% 97% 94%   General: Alert, oriented x3, no distress Head: no evidence of trauma, PERRL, EOMI, no exophtalmos or lid lag, no myxedema, no xanthelasma; normal ears, nose and oropharynx Neck: normal jugular venous pulsations and no hepatojugular reflux; brisk carotid pulses without delay and no carotid bruits Chest: clear to auscultation, no signs  of consolidation by percussion or palpation, normal fremitus, symmetrical and full respiratory excursions Cardiovascular: normal position and quality of the apical impulse, regular rhythm, normal first and second heart sounds, no rubs or gallops, no murmur Abdomen: no tenderness or distention, no masses by palpation, no abnormal pulsatility or arterial bruits, normal bowel sounds, no hepatosplenomegaly Extremities: no clubbing, cyanosis or edema; 2+ radial, ulnar and brachial pulses bilaterally; 2+ right femoral, posterior tibial and dorsalis pedis pulses; 2+ left femoral, posterior tibial and dorsalis pedis pulses; no subclavian or femoral bruits Neurological: grossly nonfocal  LABS  CBC  Recent Labs  05/21/15 0523  WBC 5.7  HGB 12.8*  HCT 36.8*  MCV 90.2  PLT 196   Basic Metabolic Panel  Recent Labs  05/20/15 0455 05/21/15 0523  NA 135 136  K 3.1* 3.9  CL 97* 103  CO2 28 26  GLUCOSE 96 104*  BUN 15 14  CREATININE 1.13 1.11  CALCIUM 8.6* 9.0   Liver Function Tests No results for input(s): AST, ALT, ALKPHOS, BILITOT, PROT, ALBUMIN in the last 72 hours. No results for input(s): LIPASE, AMYLASE in the last 72 hours. Cardiac Enzymes  Recent Labs  05/20/15 1724 05/20/15 2257 05/21/15 0523  TROPONINI 11.95* 13.35* 8.58*    Radiology Studies Imaging results have been reviewed and No results found.  TELE NSR, NSVT x2 (5 beats)  ECHO Left ventricle: The cavity size was normal. Systolic function was normal. The estimated ejection fraction was in the range of 55% to 60%. There is akinesis of the basal inferior myocardium. There is hypokinesis of the basal-midinferolateral myocardium. Features are consistent with a pseudonormal left ventricular filling pattern, with concomitant abnormal relaxation and increased filling pressure (grade 2 diastolic dysfunction). Doppler parameters are consistent with high ventricular filling pressure. Aortic valve:  Moderate thickening and calcification, consistent with sclerosis. Mitral valve: Calcified annulus. Mildly thickened leaflets . There was mild regurgitation.  ECG NSR, completed inferoposterior MI  ASSESSMENT AND PLAN  DC home. Completed inferoposterior MI following emergency angioplasty for inferior STEMI with RCA "no reflow", now hemodynamically compensated with very brief NSVT. Renal function completely recovered. BP limits use of ACEi or higher dose beta blocker. Cough preceds the acute coronary event and is likely due to URI.  DC home with low dose loop diuretic (furosemide 20 mg PO daily), at least temporarily. Indefinite ASA+Brilinta TOC visit in 1 week. F/U Dr. Sw[place]iland, first available.  Thurmon Fair, MD, [facility] [phone] office [phone] pager 05/21/2015 10:25 AM

## 2015-05-28 NOTE — Progress Notes (Signed)
Cardiology Office Note   Date:  05/30/2015   ID:  ATILLA ZOLLNER, DOB Nov 17, 1950, MRN [id number]  PCP:  Carollee Herter, MD  Cardiologist:  Dr. Swaziland  Post hospital follow up    History of Present Illness: Henry Rogers is a 64 y.o. male with a history of CAD s/p inf STEMI s/p BMS to RCA (01/2014), HLD, GERD, mild OSA, metabolic syndrome and ischemic CM ( EF previously 45-50%) who was recently admitted to [facility] for inferior STEMI s/p PCTA to RCA c/b cardiogenic shock who presents to clinic for post hospital follow up.   He has a hx of CAD w/ previous inferior wall STEMI 02/03/2014 treated with a BMS by Dr. Swaziland. The right coronary is ectatic/very large in diameter and was treated with a 5.0 bare-metal stent. Brilinta was discontinued in July 2016. Patient did well since that time until 05/05/14 when he began to develop chest discomfort while at work. Shortly thereafter he took a SL NTG x2. EMS was then called and EKG performed in route to the hospital revealed inferior ST elevation infarction. Upon arrival in the ambulance bay, the patient was in shock with a last recorded SBP of 65 mmHg and in 8/10 chest pain and he was brought straight to the cath lab. This revealed complete inferoposterior STEMI with PTCA of the dRCA. The procedure was complicated by heavy thrombus and poor reflow. He was initially on pressors and IABP. 2D echo on 05/18/15 LVEF 55-60%, akinesis basal inferior wall, grade II diastolic dysfunction. He had some pulmonary edema that resolved with diuresis. He also had some brief post cath atrial fibrillation which resolved after IV amio.  Today he presents for post hospital follow up. He is doing well. No further chest pain. He does get some mild SOB when walking up stairs. No LE edema, orthopnea, or PND. No palpations, dizziness, syncope or blood in stool or urine. He is slowly recovering and feeling better everyday. Wants to go to [facility] to get second option-  requesting records. He still has a cough that is bothering him. It is productive with clear sputum. It is disturbing his sleep. Wife worried he has a PNA. No fevers or chills.     Past Medical History  Diagnosis Date  . Hemorrhoids   . Tinnitus   . Hearing loss   . HH (hiatus hernia)   . GERD (gastroesophageal reflux disease)   . OSA (obstructive sleep apnea)     a. mild not requiring CPAP at this time  . CAD (coronary artery disease)     a. inf STEMI s/p BMS to mRCA (02/03/14)  b. inf STEMI s/p PCTA to RCA with slow reflow.  . Ischemic cardiomyopathy     a. ECHO 01/2014 EF 45-50% and akinesis of the entire inferior myocardium. Mild MR.   b. now resolved (EF 55-60% on 04/22/15)    . Varicose veins   . PAF (paroxysmal atrial fibrillation) (HCC)     a. brief episode after cardiac cath on 05/16/15- resolved after IV amiodarone.  . Chronic diastolic CHF (congestive heart failure) (HCC)     a. 2D echo on 05/18/15 LVEF 55-60%, akinesis basal inferior wall, G2DD   . Metabolic syndrome     Past Surgical History  Procedure Laterality Date  . Colonoscopy  2006    Dr. Elnoria Howard  . Coronary angioplasty with stent placement  02/02/14    BMS to RCA  . Left heart catheterization with coronary angiogram N/A 02/02/2014  Procedure: LEFT HEART CATHETERIZATION WITH CORONARY ANGIOGRAM;  Surgeon: Peter M Swaziland, MD;  Location: [facility] CATH LAB;  Service: Cardiovascular;  Laterality: N/A;  . Endovenous ablation saphenous vein w/ laser Right 10-12-2014    EVLA right greater saphenous vein by Gretta Began MD  . Carpal tunnel release Bilateral 03/02/2015    Procedure: BILATERAL CARPAL TUNNEL RELEASE ;  Surgeon: Cindee Salt, MD;  Location: [facility];  Service: Orthopedics;  Laterality: Bilateral;  . Stab phlebectomy  Right 03-15-2015    stab phlebectomy > 20 incisions (right leg) by Gretta Began MD  . Cardiac catheterization N/A 05/16/2015    Procedure: Left Heart Cath and Coronary Angiography;  Surgeon:  Lyn Records, MD;  Location: [facility] INVASIVE CV LAB;  Service: Cardiovascular;  Laterality: N/A;  . Cardiac catheterization N/A 05/16/2015    Procedure: Coronary Stent Intervention;  Surgeon: Lyn Records, MD;  Location: [facility] INVASIVE CV LAB;  Service: Cardiovascular;  Laterality: N/A;     Current Outpatient Prescriptions  Medication Sig Dispense Refill  . aspirin 81 MG tablet Take 81 mg by mouth daily.    [name] atorvastatin (LIPITOR) 80 MG tablet Take 1 tablet (80 mg total) by mouth daily at 6 PM. 90 tablet 3  . benzonatate (TESSALON) 200 MG capsule Take 1 capsule (200 mg total) by mouth 3 (three) times daily. 20 capsule 0  . chlorpheniramine-HYDROcodone (TUSSIONEX) 10-8 MG/5ML SUER Take 5 mLs by mouth every 12 (twelve) hours as needed for cough. 140 mL 0  . furosemide (LASIX) 20 MG tablet Take 1 tablet (20 mg total) by mouth daily. 30 tablet 3  . Melatonin 10 MG CAPS Take 10 mg by mouth at bedtime as needed (for sleep).     . metoprolol tartrate (LOPRESSOR) 25 MG tablet Take 1 tablet (25 mg total) by mouth 2 (two) times daily. 30 tablet 5  . Multiple Vitamin (MULTIVITAMIN) tablet Take 1 tablet by mouth daily.    . nitroGLYCERIN (NITROSTAT) 0.4 MG SL tablet Place 0.4 mg under the tongue every 5 (five) minutes as needed for chest pain (x 2 doses).    . Nutritional Supplements (JUICE PLUS FIBRE PO) Take 2 tablets by mouth 2 (two) times daily. Fruit blend    . omeprazole (PRILOSEC) 40 MG capsule Take 1 capsule (40 mg total) by mouth daily. 90 capsule 3  . ranitidine (ZANTAC) 75 MG tablet Take 75 mg by mouth at bedtime.     . tamsulosin (FLOMAX) 0.4 MG CAPS capsule Take 0.4 mg by mouth daily. Take 2 capsules in the morning and one in the evening    . ticagrelor (BRILINTA) 90 MG TABS tablet Take 1 tablet (90 mg total) by mouth 2 (two) times daily. 60 tablet 11  . zolpidem (AMBIEN) 5 MG tablet Take 1 tablet (5 mg total) by mouth at bedtime as needed for sleep. 30 tablet 0   No current  facility-administered medications for this visit.    Allergies:   Ace inhibitors    Social History:  The patient  reports that he has quit smoking. He has never used smokeless tobacco. He reports that he drinks about 1.8 oz of alcohol per week. He reports that he does not use illicit drugs.   Family History:  The patient's family history includes Cancer in his maternal grandmother; Hypertension in his mother.    ROS:  Please see the history of present illness.   Otherwise, review of systems are positive for NONE.  All other systems are  reviewed and negative.    PHYSICAL EXAM: VS:  BP 102/66 mmHg  Pulse 71  Ht  (1.676 m)  Wt 187 lb 3.2 oz (84.913 kg)  BMI 30.23 kg/m2 , BMI Body mass index is 30.23 kg/(m^2). GEN: Well nourished, well developed, in no acute distress HEENT: normal Neck: no JVD, carotid bruits, or masses Cardiac: RRR; no murmurs, rubs, or gallops,no edema  Respiratory:  clear to auscultation bilaterally, normal work of breathing GI: soft, nontender, nondistended, + BS MS: no deformity or atrophy Skin: warm and dry, no rash Neuro:  Strength and sensation are intact Psych: euthymic mood, full affect   EKG:  EKG is not ordered today.    Recent Labs: 05/16/2015: B Natriuretic Peptide 28.6 05/17/2015: ALT 50 05/21/2015: BUN 14; Creatinine, Ser 1.11; Hemoglobin 12.8*; Platelets 196; Potassium 3.9; Sodium 136    Lipid Panel    Component Value Date/Time   CHOL 115 01/03/2015 0001   TRIG 134 01/03/2015 0001   HDL 37* 01/03/2015 0001   CHOLHDL 3.1 01/03/2015 0001   VLDL 27 01/03/2015 0001   LDLCALC 51 01/03/2015 0001      Wt Readings from Last 3 Encounters:  05/30/15 187 lb 3.2 oz (84.913 kg)  05/21/15 195 lb 4.8 oz (88.587 kg)  04/19/15 188 lb 6.4 oz (85.458 kg)      Other studies Reviewed: Additional studies/ records that were reviewed today include: LHC, 2D ECHO  Review of the above records demonstrates: see HPI.    ASSESSMENT AND  PLAN:  ALIOUNE HODGKIN is a 64 y.o. male with a history of CAD s/p inf STEMI s/p BMS to RCA (01/2014), HLD, GERD, mild OSA, metabolic syndrome and ischemic CM ( EF previously 45-50%--> now resolved) who was recently admitted to [facility] for inferior STEMI s/p PCTA to RCA c/b cardiogenic shock who presents to clinic for post hospital follow up.   Completed inferoposterior STEMI complicated by cardiogenic shock s/p SL NTG x2 -- s/p emergent cardiac catheterization with PTCA of distal RCA, procedure complicated by heavy thrombus and poor reflow. He was initially on pressors and IABP. -- Continue med therapy wih ASA, atorva 80, lopressor 25 bid, brillinta. No ACE-I due to low BPs (BP 102/66 today) -- He plans to work with cardiac rehab  Chronic diastolic HF -- 2D echo on 05/18/15 LVEF 55-60%, akinesis basal inferior wall, grade II diastolic dysfunction. -- Appears euvolemic today.  -- Continue low dose loop diuretic (furosemide 20 mg PO daily). Will obtain BMET   Brief post-cath atrial fibrillation- patient's acute STEMI was complicated by acute atrial fibrillation which resolved s/p IV amiodarone. No ECG today but sounds regular on exam   AKI- likely secondary to hypotension, ATN -- Renal function completely recovered at discharge. BMET today.   NSVT -- Continue Lopressor  BID   Cough- this preceeded his acute coronary event and is likely due to URI. He continues to have cough that concerns him. We will obtain a CXR to rule out PNA. Continue supportive care.   Records are being sent to [facility]   Current medicines are reviewed at length with the patient today.  The patient has concerns regarding medicines. He wants to know if he needs to be on lasix forever, I have told him to continue this for now.   The following changes have been made:  no change  Labs/ tests ordered today include:  Orders Placed This Encounter  Procedures  . DG Chest 2 View  . Basic Metabolic Panel (  BMET)      Disposition:   FU with Dr. Swaziland first available ( looks like February)   Signed, Allena Katz  05/30/2015 11:09 AM    [facility] [address] [place], [place], [place]  [address] Phone: [phone]; Fax: [phone]

## 2015-05-30 ENCOUNTER — Encounter: Payer: Self-pay | Admitting: Physician Assistant

## 2015-05-30 ENCOUNTER — Ambulatory Visit
Admission: RE | Admit: 2015-05-30 | Discharge: 2015-05-30 | Disposition: A | Discharge status: Home / Self Care | Payer: Medicare Other | Source: Ambulatory Visit | Attending: Physician Assistant | Admitting: Physician Assistant

## 2015-05-30 ENCOUNTER — Ambulatory Visit (INDEPENDENT_AMBULATORY_CARE_PROVIDER_SITE_OTHER): Payer: Medicare Other | Admitting: Physician Assistant

## 2015-05-30 VITALS — BP 102/66 | HR 71 | Ht 66.0 in | Wt 187.2 lb

## 2015-05-30 DIAGNOSIS — I2510 Atherosclerotic heart disease of native coronary artery without angina pectoris: Secondary | ICD-10-CM | POA: Diagnosis not present

## 2015-05-30 DIAGNOSIS — R05 Cough: Secondary | ICD-10-CM

## 2015-05-30 DIAGNOSIS — R059 Cough, unspecified: Secondary | ICD-10-CM

## 2015-05-30 DIAGNOSIS — I5031 Acute diastolic (congestive) heart failure: Secondary | ICD-10-CM | POA: Diagnosis not present

## 2015-05-30 LAB — BASIC METABOLIC PANEL
BUN: 21 mg/dL (ref 7–25)
CO2: 28 mmol/L (ref 20–31)
Calcium: 9 mg/dL (ref 8.6–10.3)
Chloride: 102 mmol/L (ref 98–110)
Creat: 1.12 mg/dL (ref 0.70–1.25)
Glucose, Bld: 107 mg/dL — ABNORMAL HIGH (ref 65–99)
Potassium: 4.4 mmol/L (ref 3.5–5.3)
Sodium: 135 mmol/L (ref 135–146)

## 2015-05-30 NOTE — Patient Instructions (Addendum)
Medication Instructions:  Your physician recommends that you continue on your current medications as directed. Please refer to the Current Medication list given to you today.   Labwork: Your physician recommends that you have lab work today: bmet   Testing/Procedures: -None  Follow-Up: Your physician recommends that you keep your scheduled  follow-up appointment with Dr. Thomasene Lot.   Any Other Special Instructions Will Be Listed Below (If Applicable). A chest x-ray takes a picture of the organs and structures inside the chest, including the heart, lungs, and blood vessels. This test can show several things, including, whether the heart is enlarges; whether fluid is building up in the lungs; and whether pacemaker / defibrillator leads are still in place.     If you need a refill on your cardiac medications before your next appointment, please call your pharmacy.

## 2015-06-01 ENCOUNTER — Telehealth: Payer: Self-pay | Admitting: *Deleted

## 2015-06-01 NOTE — Telephone Encounter (Signed)
-----   Message from Janetta Hora, [place] sent at 05/30/2015  6:05 PM EST ----- Can you let patient know that his CXR was clear and kidney function and potassium looks good. Plan to continue lasix and other medications. Continue symptomatic care for cough

## 2015-06-01 NOTE — Telephone Encounter (Signed)
Pt returned my call and he was made ware that his CXR was clear and that his labs were normal and for him to continue with his Lasix and other medications.  Pt verbalized understanding

## 2015-06-05 ENCOUNTER — Encounter: Payer: Medicare Other | Admitting: Gastroenterology

## 2015-06-07 ENCOUNTER — Encounter (HOSPITAL_COMMUNITY)
Admission: RE | Admit: 2015-06-07 | Discharge: 2015-06-07 | Disposition: A | Discharge status: Home / Self Care | Payer: Medicare Other | Source: Ambulatory Visit | Attending: Cardiology | Admitting: Cardiology

## 2015-06-07 DIAGNOSIS — Z955 Presence of coronary angioplasty implant and graft: Secondary | ICD-10-CM | POA: Insufficient documentation

## 2015-06-07 NOTE — Progress Notes (Signed)
Cardiac Rehab Medication Review by a Pharmacist  Does the patient  feel that his/her medications are working for him/her?  yes  Has the patient been experiencing any side effects to the medications prescribed?  no  Does the patient measure his/her own blood pressure or blood glucose at home?  no   Does the patient have any problems obtaining medications due to transportation or finances?   no  Understanding of regimen: excellent Understanding of indications: excellent Potential of compliance: excellent    Pharmacist comments: Henry Rogers uses a pill box to make sure he doesn't miss any medications. He understood the purpose of all of his medications and doesn't feel any different (no side effects). I answered his questions and updated his list appropriately.   [name], PharmD Clinical Pharmacy Resident Pager: [phone] 06/07/2015 9:46 AM

## 2015-06-11 ENCOUNTER — Ambulatory Visit (HOSPITAL_COMMUNITY): Payer: Medicare Other

## 2015-06-11 ENCOUNTER — Telehealth (HOSPITAL_COMMUNITY): Payer: Self-pay | Admitting: Family Medicine

## 2015-06-11 ENCOUNTER — Encounter (HOSPITAL_COMMUNITY): Payer: Medicare Other

## 2015-06-13 ENCOUNTER — Encounter (HOSPITAL_COMMUNITY)
Admission: RE | Admit: 2015-06-13 | Discharge: 2015-06-13 | Disposition: A | Discharge status: Home / Self Care | Payer: Medicare Other | Source: Ambulatory Visit | Attending: Cardiology | Admitting: Cardiology

## 2015-06-13 ENCOUNTER — Ambulatory Visit (HOSPITAL_COMMUNITY): Payer: Medicare Other

## 2015-06-13 ENCOUNTER — Telehealth: Payer: Self-pay | Admitting: Cardiology

## 2015-06-13 DIAGNOSIS — Z955 Presence of coronary angioplasty implant and graft: Secondary | ICD-10-CM | POA: Diagnosis present

## 2015-06-13 NOTE — Telephone Encounter (Signed)
Spoke to wife Informed her Dr Swaziland DID NOT WANT TO CHANGE CURRENT TREATMENT. SHE STATES SHE WILL TILL PATIENT . PATIENT WAS A SLEEP AT PRESENT.

## 2015-06-13 NOTE — Progress Notes (Signed)
Pt started cardiac rehab today.  Pt tolerated light exercise without difficulty. Entry blood pressure 112/74 . Blood pressure noted at 96/60 on the nustep. Patient asymptomatic. Patient given water recheck blood pressure 98/60. Patient also given gatorade., telemetry-Sinus rhythm with t wave inversion, asymptomatic.  Medication list reconciled.  Pt verbalized compliance with medications and denies barriers to compliance. PSYCHOSOCIAL ASSESSMENT:  PHQ-0. Pt exhibits positive coping skills, hopeful outlook with supportive family. No psychosocial needs identified at this time, no psychosocial interventions necessary.    Pt enjoys mediating litigations and spending time with his one year old granson.   Pt cardiac rehab  goal is  to have no more heart attacks.  Patient encouraged to attend any of the education classes that Joe needs as a refresher to increase ability to achieve these goals. Joe attended many of the education classes last year when he participated in the program.  Pt long term cardiac rehab goal is to do what he wants to as long as he is able..  Pt oriented to exercise equipment and routine.  Understanding verbalized. Dr Elvis Coil office called and notified of today's low blood pressure with exercise. I spoke with Burt Knack RN.  Today's ECG tracings and exercise flow sheet faxed to Dr Elvis Coil office for review. Dr Swaziland reviewed today's data. No new orders received. Will continue to monitor the patient throughout  the program.

## 2015-06-13 NOTE — Telephone Encounter (Signed)
Byrd Hesselbach called in wanting to speak with a nurse about the pt having a low BP while in rehab. Please f/u   Thanks

## 2015-06-13 NOTE — Telephone Encounter (Signed)
INFORMATION GIVEN TO DR Swaziland TO REVIEW  NO CHANGES WITH CURRENT TREATMENT PER DR Swaziland NOTIFIED MARIA

## 2015-06-13 NOTE — Telephone Encounter (Signed)
SPOKE TO Henry Rogers STATES FIRST DAY AT EXERCISING AT [facility]- EXERTIONAL B/P 90/60 NO DIZZINESS,LIGHTHEADNESS  NO ISSUE NOTED PATIENT WILL BE GOING TO  [facility]  NEXT WEEK SENDING [facility] RECORDS  FOR DR Swaziland TO REVIEW.

## 2015-06-22 ENCOUNTER — Encounter: Payer: Self-pay | Admitting: Cardiology

## 2015-06-25 ENCOUNTER — Ambulatory Visit (HOSPITAL_COMMUNITY): Payer: Medicare Other

## 2015-06-25 ENCOUNTER — Encounter (HOSPITAL_COMMUNITY)
Admission: RE | Admit: 2015-06-25 | Discharge: 2015-06-25 | Disposition: A | Discharge status: Home / Self Care | Payer: Medicare Other | Source: Ambulatory Visit | Attending: Cardiology | Admitting: Cardiology

## 2015-06-25 DIAGNOSIS — Z955 Presence of coronary angioplasty implant and graft: Secondary | ICD-10-CM | POA: Diagnosis present

## 2015-06-27 ENCOUNTER — Encounter (HOSPITAL_COMMUNITY)
Admission: RE | Admit: 2015-06-27 | Discharge: 2015-06-27 | Disposition: A | Discharge status: Home / Self Care | Payer: Medicare Other | Source: Ambulatory Visit | Attending: Cardiology | Admitting: Cardiology

## 2015-06-27 ENCOUNTER — Ambulatory Visit (HOSPITAL_COMMUNITY): Payer: Medicare Other

## 2015-06-27 DIAGNOSIS — Z955 Presence of coronary angioplasty implant and graft: Secondary | ICD-10-CM | POA: Diagnosis not present

## 2015-06-27 NOTE — Progress Notes (Signed)
Reviewed home exercise with pt today.  Pt plans to walk at home with wife for exercise.  They also do core exercises.  Reviewed THR, pulse, RPE, sign and symptoms, NTG use, and when to call 911 or MD.  Pt voiced understanding. Fabio Pierce, MA, ACSM RCEP

## 2015-06-29 ENCOUNTER — Ambulatory Visit (HOSPITAL_COMMUNITY): Payer: Medicare Other

## 2015-06-29 ENCOUNTER — Encounter (HOSPITAL_COMMUNITY)
Admission: RE | Admit: 2015-06-29 | Discharge: 2015-06-29 | Disposition: A | Discharge status: Home / Self Care | Payer: Medicare Other | Source: Ambulatory Visit | Attending: Cardiology | Admitting: Cardiology

## 2015-06-29 DIAGNOSIS — Z955 Presence of coronary angioplasty implant and graft: Secondary | ICD-10-CM | POA: Diagnosis not present

## 2015-07-02 ENCOUNTER — Encounter (HOSPITAL_COMMUNITY): Payer: Medicare Other

## 2015-07-02 ENCOUNTER — Ambulatory Visit (HOSPITAL_COMMUNITY): Payer: Medicare Other

## 2015-07-03 ENCOUNTER — Telehealth: Payer: Self-pay | Admitting: Family Medicine

## 2015-07-03 DIAGNOSIS — N400 Enlarged prostate without lower urinary tract symptoms: Secondary | ICD-10-CM

## 2015-07-03 DIAGNOSIS — N3281 Overactive bladder: Secondary | ICD-10-CM

## 2015-07-03 MED ORDER — TADALAFIL 5 MG PO TABS: 5.0000 mg | ORAL_TABLET | Freq: Every day | ORAL | Status: DC | PRN

## 2015-07-03 NOTE — Telephone Encounter (Signed)
He has been on a total of 3 of the 0.4 mg of Flomax and still having difficulty with urinary symptoms. There was a mixup in communication being on a higher dose. He will stop this medication and I will switch him to Cialis which he notes has helped him with this in the past.

## 2015-07-03 NOTE — Telephone Encounter (Signed)
Pt asked for refill Tamsulosin 0.4mg  and said you told him to increase to 4 pills a day, please send refill for 4 a day to [facility]

## 2015-07-04 ENCOUNTER — Ambulatory Visit (HOSPITAL_COMMUNITY): Payer: Medicare Other

## 2015-07-04 ENCOUNTER — Encounter (HOSPITAL_COMMUNITY)
Admission: RE | Admit: 2015-07-04 | Discharge: 2015-07-04 | Disposition: A | Discharge status: Home / Self Care | Payer: Medicare Other | Source: Ambulatory Visit | Attending: Cardiology | Admitting: Cardiology

## 2015-07-04 ENCOUNTER — Telehealth (HOSPITAL_COMMUNITY): Payer: Self-pay | Admitting: *Deleted

## 2015-07-04 DIAGNOSIS — Z955 Presence of coronary angioplasty implant and graft: Secondary | ICD-10-CM | POA: Diagnosis not present

## 2015-07-04 NOTE — Telephone Encounter (Signed)
-----   Message from Peter M Swaziland, MD sent at 07/04/2015 12:40 PM EST ----- Regarding: RE: Exercise Prescription Change OK to proceed with HIT training.  Peter Swaziland MD, [facility]   ----- Message -----    From: Hazle Nordmann    Sent: 07/04/2015  11:30 AM      To: Peter M Swaziland, MD Subject: Exercise Prescription Change                   Dr. Swaziland,  Pt is interested in doing high intensity interval training (HIIT) in Cardiac Rehab.  They have been in program for 3 weeks and have been doing great.  We would like to change their exercise prescription to include HIIT.  Their current THR is 77-123 (50-80%) and we would like to increase it to 146 max (95 %) for HIIT.  Their RPE levels for the HIIT would reach up to 16-17 and active rest would be 11-13.  They would start at 2 min of active rest and 30 sec of high intensity and progress as tolerated. If you are agreeable to this change in exercise prescription please let us know.  Thanks so much for your help!  Fabio Pierce, MA, ACSM RCEP

## 2015-07-06 ENCOUNTER — Ambulatory Visit (HOSPITAL_COMMUNITY): Payer: Medicare Other

## 2015-07-06 ENCOUNTER — Encounter (HOSPITAL_COMMUNITY)
Admission: RE | Admit: 2015-07-06 | Discharge: 2015-07-06 | Disposition: A | Discharge status: Home / Self Care | Payer: Medicare Other | Source: Ambulatory Visit | Attending: Cardiology | Admitting: Cardiology

## 2015-07-06 DIAGNOSIS — Z955 Presence of coronary angioplasty implant and graft: Secondary | ICD-10-CM | POA: Diagnosis not present

## 2015-07-09 ENCOUNTER — Ambulatory Visit (HOSPITAL_COMMUNITY): Payer: Medicare Other

## 2015-07-09 ENCOUNTER — Encounter (HOSPITAL_COMMUNITY)
Admission: RE | Admit: 2015-07-09 | Discharge: 2015-07-09 | Disposition: A | Discharge status: Home / Self Care | Payer: Medicare Other | Source: Ambulatory Visit | Attending: Cardiology | Admitting: Cardiology

## 2015-07-09 DIAGNOSIS — Z955 Presence of coronary angioplasty implant and graft: Secondary | ICD-10-CM | POA: Diagnosis not present

## 2015-07-11 ENCOUNTER — Telehealth: Payer: Self-pay | Admitting: Family Medicine

## 2015-07-11 ENCOUNTER — Ambulatory Visit (HOSPITAL_COMMUNITY): Payer: Medicare Other

## 2015-07-11 ENCOUNTER — Encounter (HOSPITAL_COMMUNITY)
Admission: RE | Admit: 2015-07-11 | Discharge: 2015-07-11 | Disposition: A | Discharge status: Home / Self Care | Payer: Medicare Other | Source: Ambulatory Visit | Attending: Cardiology | Admitting: Cardiology

## 2015-07-11 DIAGNOSIS — Z955 Presence of coronary angioplasty implant and graft: Secondary | ICD-10-CM | POA: Diagnosis not present

## 2015-07-11 NOTE — Progress Notes (Signed)
Henry Rogers 64 y.o. male Nutrition Note Spoke with pt. Pt known to this Clinical research associate from previous admission. Nutrition Plan and Nutrition Survey goals reviewed with pt. Pt is following Step 2 of the Therapeutic Lifestyle Changes diet. Pt is pre-diabetic according to his last A1c. Pt with dx of CHF. Per discussion, pt does not use canned/convenience foods often. Pt rarely adds salt to food. Pt eats out infrequently. Pt expressed understanding of the information reviewed. Pt aware of nutrition education classes offered and states he has previously attended Nutrition I and II class.  Pt plans on attending nutrition classes again. Lab Results  Component Value Date   HGBA1C 5.8* 01/03/2015   Nutrition Diagnosis ? Food-and nutrition-related knowledge deficit related to lack of exposure to information as related to diagnosis of: ? CVD ? Pre-DM ? Overweight related to excessive energy intake as evidenced by a BMI of 29.5  Nutrition RX/ Estimated Daily Nutrition Needs for: wt loss 1350-1850 Kcal, 35-50 gm fat, 9-12 gm sat fat, 1.3-1.8 gm trans-fat, <1500 mg sodium  Nutrition Intervention ? Pt's individual nutrition plan reviewed with pt. ? Benefits of adopting Therapeutic Lifestyle Changes discussed when Medficts reviewed. ? Pt to attend the Portion Distortion class ? Pt to attend the   ? Nutrition I class                                          ? Nutrition II class  ? Continue client-centered nutrition education by RD, as part of interdisciplinary care. Goal(s) ? Pt to identify food quantities necessary to achieve: ? wt loss to a goal wt of 160-178 lb (72.7-80.9 kg) at graduation from cardiac rehab.  ? Pt to describe the benefit of including fruits, vegetables, whole grains, and low-fat dairy products in a heart healthy meal plan. Monitor and Evaluate progress toward nutrition goal with team. Nutrition Risk: Change to Moderate Mickle Plumb, M.Ed, RD, LDN, CDE 07/11/2015 1:50 PM

## 2015-07-11 NOTE — Telephone Encounter (Signed)
Pt called and just wanted to know if it was ok for him to take the cialis everyday while taking Brilinta and Metoprolol. Please advise. Pt can be reached at [phone].

## 2015-07-13 ENCOUNTER — Encounter (HOSPITAL_COMMUNITY)
Admission: RE | Admit: 2015-07-13 | Discharge: 2015-07-13 | Disposition: A | Discharge status: Home / Self Care | Payer: Medicare Other | Source: Ambulatory Visit | Attending: Cardiology | Admitting: Cardiology

## 2015-07-13 ENCOUNTER — Ambulatory Visit (HOSPITAL_COMMUNITY): Payer: Medicare Other

## 2015-07-13 DIAGNOSIS — Z955 Presence of coronary angioplasty implant and graft: Secondary | ICD-10-CM | POA: Diagnosis not present

## 2015-07-17 ENCOUNTER — Other Ambulatory Visit: Payer: Self-pay

## 2015-07-17 MED ORDER — NITROGLYCERIN 0.4 MG SL SUBL
0.4000 mg | SUBLINGUAL_TABLET | SUBLINGUAL | Status: DC | PRN
Start: 2015-07-17 — End: 2015-07-25

## 2015-07-18 ENCOUNTER — Encounter (HOSPITAL_COMMUNITY)
Admission: RE | Admit: 2015-07-18 | Discharge: 2015-07-18 | Disposition: A | Discharge status: Home / Self Care | Payer: Medicare Other | Source: Ambulatory Visit | Attending: Cardiology | Admitting: Cardiology

## 2015-07-18 ENCOUNTER — Ambulatory Visit (HOSPITAL_COMMUNITY): Payer: Medicare Other

## 2015-07-18 DIAGNOSIS — Z955 Presence of coronary angioplasty implant and graft: Secondary | ICD-10-CM | POA: Diagnosis not present

## 2015-07-20 ENCOUNTER — Ambulatory Visit (HOSPITAL_COMMUNITY): Payer: Medicare Other

## 2015-07-20 ENCOUNTER — Encounter (HOSPITAL_COMMUNITY)
Admission: RE | Admit: 2015-07-20 | Discharge: 2015-07-20 | Disposition: A | Discharge status: Home / Self Care | Payer: Medicare Other | Source: Ambulatory Visit | Attending: Cardiology | Admitting: Cardiology

## 2015-07-20 DIAGNOSIS — Z955 Presence of coronary angioplasty implant and graft: Secondary | ICD-10-CM | POA: Diagnosis not present

## 2015-07-25 ENCOUNTER — Encounter (HOSPITAL_COMMUNITY)
Admission: RE | Admit: 2015-07-25 | Discharge: 2015-07-25 | Disposition: A | Discharge status: Home / Self Care | Payer: Medicare Other | Source: Ambulatory Visit | Attending: Cardiology | Admitting: Cardiology

## 2015-07-25 ENCOUNTER — Other Ambulatory Visit: Payer: Self-pay | Admitting: Cardiology

## 2015-07-25 ENCOUNTER — Ambulatory Visit (HOSPITAL_COMMUNITY): Payer: Medicare Other

## 2015-07-25 DIAGNOSIS — Z955 Presence of coronary angioplasty implant and graft: Secondary | ICD-10-CM | POA: Diagnosis present

## 2015-07-25 NOTE — Telephone Encounter (Signed)
Rx(s) sent to pharmacy electronically.  

## 2015-07-27 ENCOUNTER — Ambulatory Visit (HOSPITAL_COMMUNITY): Payer: Medicare Other

## 2015-07-27 ENCOUNTER — Encounter (HOSPITAL_COMMUNITY)
Admission: RE | Admit: 2015-07-27 | Discharge: 2015-07-27 | Disposition: A | Discharge status: Home / Self Care | Payer: Medicare Other | Source: Ambulatory Visit | Attending: Cardiology | Admitting: Cardiology

## 2015-07-27 ENCOUNTER — Other Ambulatory Visit: Payer: Self-pay | Admitting: Family Medicine

## 2015-07-27 DIAGNOSIS — Z955 Presence of coronary angioplasty implant and graft: Secondary | ICD-10-CM | POA: Diagnosis not present

## 2015-07-30 ENCOUNTER — Ambulatory Visit (HOSPITAL_COMMUNITY): Payer: Medicare Other

## 2015-07-30 ENCOUNTER — Ambulatory Visit: Payer: Medicare Other | Admitting: Gastroenterology

## 2015-07-30 ENCOUNTER — Encounter (HOSPITAL_COMMUNITY): Admission: RE | Admit: 2015-07-30 | Payer: Medicare Other | Source: Ambulatory Visit

## 2015-07-30 ENCOUNTER — Telehealth (HOSPITAL_COMMUNITY): Payer: Self-pay | Admitting: *Deleted

## 2015-08-01 ENCOUNTER — Encounter (HOSPITAL_COMMUNITY)
Admission: RE | Admit: 2015-08-01 | Discharge: 2015-08-01 | Disposition: A | Discharge status: Home / Self Care | Payer: Medicare Other | Source: Ambulatory Visit | Attending: Cardiology | Admitting: Cardiology

## 2015-08-01 ENCOUNTER — Ambulatory Visit (HOSPITAL_COMMUNITY): Payer: Medicare Other

## 2015-08-01 DIAGNOSIS — Z955 Presence of coronary angioplasty implant and graft: Secondary | ICD-10-CM | POA: Diagnosis not present

## 2015-08-03 ENCOUNTER — Encounter (HOSPITAL_COMMUNITY)
Admission: RE | Admit: 2015-08-03 | Discharge: 2015-08-03 | Disposition: A | Discharge status: Home / Self Care | Payer: Medicare Other | Source: Ambulatory Visit | Attending: Cardiology | Admitting: Cardiology

## 2015-08-03 ENCOUNTER — Ambulatory Visit (HOSPITAL_COMMUNITY): Payer: Medicare Other

## 2015-08-03 DIAGNOSIS — Z955 Presence of coronary angioplasty implant and graft: Secondary | ICD-10-CM | POA: Diagnosis not present

## 2015-08-06 ENCOUNTER — Ambulatory Visit (HOSPITAL_COMMUNITY): Payer: Medicare Other

## 2015-08-06 ENCOUNTER — Encounter (HOSPITAL_COMMUNITY)
Admission: RE | Admit: 2015-08-06 | Discharge: 2015-08-06 | Disposition: A | Discharge status: Home / Self Care | Payer: Medicare Other | Source: Ambulatory Visit | Attending: Cardiology | Admitting: Cardiology

## 2015-08-06 DIAGNOSIS — Z955 Presence of coronary angioplasty implant and graft: Secondary | ICD-10-CM | POA: Diagnosis not present

## 2015-08-07 ENCOUNTER — Telehealth: Payer: Self-pay | Admitting: Family Medicine

## 2015-08-07 NOTE — Telephone Encounter (Signed)
Pt called & stated samples Cialis working well and needs P.A. Said not getting up during the night to use bathroom, asked for samples if not get P.A. In time

## 2015-08-07 NOTE — Telephone Encounter (Signed)
error 

## 2015-08-07 NOTE — Telephone Encounter (Signed)
Initiated P.A. CIALIS for BPH & OAB thru [facility]

## 2015-08-08 ENCOUNTER — Ambulatory Visit (HOSPITAL_COMMUNITY): Payer: Medicare Other

## 2015-08-08 ENCOUNTER — Encounter (HOSPITAL_COMMUNITY)
Admission: RE | Admit: 2015-08-08 | Discharge: 2015-08-08 | Disposition: A | Discharge status: Home / Self Care | Payer: Medicare Other | Source: Ambulatory Visit | Attending: Cardiology | Admitting: Cardiology

## 2015-08-08 DIAGNOSIS — Z955 Presence of coronary angioplasty implant and graft: Secondary | ICD-10-CM | POA: Diagnosis not present

## 2015-08-09 NOTE — Telephone Encounter (Signed)
Explained to him what the problem is and place him on Terazosin 1 mg nightly. Give him 30 of these and have him call us in one month and let us know how it is working.

## 2015-08-09 NOTE — Telephone Encounter (Signed)
P.A. Cialis denied states pt needs trial of 2 alpha blockers alternatives (tamsulosin (which he has already tried), alfuzosin, doxazosin or terazosin) So pt needs trial of one of the others  Do you want to switch?

## 2015-08-10 ENCOUNTER — Telehealth: Payer: Self-pay | Admitting: Family Medicine

## 2015-08-10 ENCOUNTER — Encounter (HOSPITAL_COMMUNITY)
Admission: RE | Admit: 2015-08-10 | Discharge: 2015-08-10 | Disposition: A | Discharge status: Home / Self Care | Payer: Medicare Other | Source: Ambulatory Visit | Attending: Cardiology | Admitting: Cardiology

## 2015-08-10 ENCOUNTER — Ambulatory Visit (HOSPITAL_COMMUNITY): Payer: Medicare Other

## 2015-08-10 DIAGNOSIS — Z955 Presence of coronary angioplasty implant and graft: Secondary | ICD-10-CM | POA: Diagnosis not present

## 2015-08-10 MED ORDER — TERAZOSIN HCL 1 MG PO CAPS: 1.0000 mg | ORAL_CAPSULE | Freq: Every day | ORAL | Status: DC

## 2015-08-10 NOTE — Telephone Encounter (Signed)
Pt informed & he will try the new medication and let us know how it works

## 2015-08-10 NOTE — Telephone Encounter (Signed)
error 

## 2015-08-13 ENCOUNTER — Encounter (HOSPITAL_COMMUNITY)
Admission: RE | Admit: 2015-08-13 | Discharge: 2015-08-13 | Disposition: A | Discharge status: Home / Self Care | Payer: Medicare Other | Source: Ambulatory Visit | Attending: Cardiology | Admitting: Cardiology

## 2015-08-13 ENCOUNTER — Ambulatory Visit (HOSPITAL_COMMUNITY): Payer: Medicare Other

## 2015-08-13 DIAGNOSIS — Z955 Presence of coronary angioplasty implant and graft: Secondary | ICD-10-CM | POA: Diagnosis not present

## 2015-08-15 ENCOUNTER — Encounter (HOSPITAL_COMMUNITY)
Admission: RE | Admit: 2015-08-15 | Discharge: 2015-08-15 | Disposition: A | Discharge status: Home / Self Care | Payer: Medicare Other | Source: Ambulatory Visit | Attending: Cardiology | Admitting: Cardiology

## 2015-08-15 ENCOUNTER — Ambulatory Visit (HOSPITAL_COMMUNITY): Payer: Medicare Other

## 2015-08-15 DIAGNOSIS — Z955 Presence of coronary angioplasty implant and graft: Secondary | ICD-10-CM | POA: Diagnosis not present

## 2015-08-17 ENCOUNTER — Ambulatory Visit (HOSPITAL_COMMUNITY): Payer: Medicare Other

## 2015-08-17 ENCOUNTER — Encounter (HOSPITAL_COMMUNITY): Payer: Medicare Other

## 2015-08-20 ENCOUNTER — Ambulatory Visit (HOSPITAL_COMMUNITY): Payer: Medicare Other

## 2015-08-20 ENCOUNTER — Encounter (HOSPITAL_COMMUNITY)
Admission: RE | Admit: 2015-08-20 | Discharge: 2015-08-20 | Disposition: A | Discharge status: Home / Self Care | Payer: Medicare Other | Source: Ambulatory Visit | Attending: Cardiology | Admitting: Cardiology

## 2015-08-20 DIAGNOSIS — Z955 Presence of coronary angioplasty implant and graft: Secondary | ICD-10-CM | POA: Diagnosis not present

## 2015-08-22 ENCOUNTER — Ambulatory Visit (HOSPITAL_COMMUNITY): Payer: Medicare Other

## 2015-08-22 ENCOUNTER — Encounter (HOSPITAL_COMMUNITY)
Admission: RE | Admit: 2015-08-22 | Discharge: 2015-08-22 | Disposition: A | Discharge status: Home / Self Care | Payer: Medicare Other | Source: Ambulatory Visit | Attending: Cardiology | Admitting: Cardiology

## 2015-08-22 DIAGNOSIS — Z955 Presence of coronary angioplasty implant and graft: Secondary | ICD-10-CM | POA: Diagnosis present

## 2015-08-24 ENCOUNTER — Encounter (HOSPITAL_COMMUNITY): Admission: RE | Admit: 2015-08-24 | Payer: Medicare Other | Source: Ambulatory Visit

## 2015-08-24 ENCOUNTER — Ambulatory Visit (HOSPITAL_COMMUNITY): Payer: Medicare Other

## 2015-08-24 ENCOUNTER — Telehealth (HOSPITAL_COMMUNITY): Payer: Self-pay | Admitting: *Deleted

## 2015-08-27 ENCOUNTER — Encounter (HOSPITAL_COMMUNITY)
Admission: RE | Admit: 2015-08-27 | Discharge: 2015-08-27 | Disposition: A | Discharge status: Home / Self Care | Payer: Medicare Other | Source: Ambulatory Visit | Attending: Cardiology | Admitting: Cardiology

## 2015-08-27 ENCOUNTER — Ambulatory Visit (HOSPITAL_COMMUNITY): Payer: Medicare Other

## 2015-08-27 DIAGNOSIS — Z955 Presence of coronary angioplasty implant and graft: Secondary | ICD-10-CM | POA: Diagnosis not present

## 2015-08-28 ENCOUNTER — Other Ambulatory Visit: Payer: Self-pay | Admitting: Physician Assistant

## 2015-08-28 NOTE — Telephone Encounter (Signed)
Rx refill sent to pharmacy. 

## 2015-08-29 ENCOUNTER — Ambulatory Visit (HOSPITAL_COMMUNITY): Payer: Medicare Other

## 2015-08-29 ENCOUNTER — Encounter (HOSPITAL_COMMUNITY)
Admission: RE | Admit: 2015-08-29 | Discharge: 2015-08-29 | Disposition: A | Discharge status: Home / Self Care | Payer: Medicare Other | Source: Ambulatory Visit | Attending: Cardiology | Admitting: Cardiology

## 2015-08-29 DIAGNOSIS — Z955 Presence of coronary angioplasty implant and graft: Secondary | ICD-10-CM | POA: Diagnosis not present

## 2015-08-31 ENCOUNTER — Encounter (HOSPITAL_COMMUNITY)
Admission: RE | Admit: 2015-08-31 | Discharge: 2015-08-31 | Disposition: A | Discharge status: Home / Self Care | Payer: Medicare Other | Source: Ambulatory Visit | Attending: Cardiology | Admitting: Cardiology

## 2015-08-31 ENCOUNTER — Ambulatory Visit (HOSPITAL_COMMUNITY): Payer: Medicare Other

## 2015-08-31 DIAGNOSIS — Z955 Presence of coronary angioplasty implant and graft: Secondary | ICD-10-CM | POA: Diagnosis not present

## 2015-09-03 ENCOUNTER — Encounter (HOSPITAL_COMMUNITY)
Admission: RE | Admit: 2015-09-03 | Discharge: 2015-09-03 | Disposition: A | Discharge status: Home / Self Care | Payer: Medicare Other | Source: Ambulatory Visit | Attending: Cardiology | Admitting: Cardiology

## 2015-09-03 ENCOUNTER — Ambulatory Visit (HOSPITAL_COMMUNITY): Payer: Medicare Other

## 2015-09-03 DIAGNOSIS — Z955 Presence of coronary angioplasty implant and graft: Secondary | ICD-10-CM | POA: Diagnosis not present

## 2015-09-04 ENCOUNTER — Ambulatory Visit (INDEPENDENT_AMBULATORY_CARE_PROVIDER_SITE_OTHER): Payer: Medicare Other | Admitting: Cardiology

## 2015-09-04 ENCOUNTER — Encounter: Payer: Self-pay | Admitting: Cardiology

## 2015-09-04 VITALS — BP 118/74 | HR 84 | Ht 66.0 in | Wt 191.0 lb

## 2015-09-04 DIAGNOSIS — I2510 Atherosclerotic heart disease of native coronary artery without angina pectoris: Secondary | ICD-10-CM

## 2015-09-04 DIAGNOSIS — E785 Hyperlipidemia, unspecified: Secondary | ICD-10-CM

## 2015-09-04 NOTE — Progress Notes (Signed)
Henry Rogers Date of Birth: 1951/07/06 Medical Record #[id number]  History of Present Illness: Henry Rogers is seen for follow up CAD. He is s/p inferior STEMI on 02/02/14. The Mid RCA was occluded with large thrombus load. He had cardiogenic shock. The RCA was stented with a 5.0x16 mm Veriflex stent. EF was 45-50%. Other vessels had no significant disease. Repeat Echo in September 2015 showed that his LV function had normalized. EF 55-60%.  He was readmitted on May 06, 2015 with recurrent inferior infarct again associated with cardiogenic shock. The stent in the mid RCA was patent but the distal RCA was occluded with heavy thrombus. This was treated with POBA only with a 5.0 mm balloon. Since then he has done very well. On follow up today he is feeling very well. No chest pain or SOB. He is doing interval training at Cardiac Rehab. His BP has been well controlled.     Medication List       This list is accurate as of: 09/04/15  2:26 PM.  Always use your most recent med list.               aspirin 81 MG tablet  Take 81 mg by mouth daily.     atorvastatin 80 MG tablet  Commonly known as:  LIPITOR  Take 1 tablet (80 mg total) by mouth daily at 6 PM.     chlorpheniramine-HYDROcodone 10-8 MG/5ML Suer  Commonly known as:  TUSSIONEX  Take 5 mLs by mouth every 12 (twelve) hours as needed for cough.     JUICE PLUS FIBRE PO  Take 1 tablet by mouth 2 (two) times daily. Fruit blend and vegetable blend     Melatonin 10 MG Caps  Take 10 mg by mouth at bedtime. Takes daily     metoprolol tartrate 25 MG tablet  Commonly known as:  LOPRESSOR  TAKE 1 TABLET(25 MG) BY MOUTH TWICE DAILY     multivitamin tablet  Take 1 tablet by mouth every evening.     nitroGLYCERIN 0.4 MG SL tablet  Commonly known as:  NITROSTAT  PLACE 1 TABLET UNDER THE TONGUE EVERY 5 MINUTES AS NEEDED FOR CHEST PAIN(FOR 2 DOSES)     omeprazole 40 MG capsule  Commonly known as:  PRILOSEC  TAKE 1 CAPSULE BY MOUTH  DAILY     ranitidine 75 MG tablet  Commonly known as:  ZANTAC  Take 75 mg by mouth at bedtime.     terazosin 1 MG capsule  Commonly known as:  HYTRIN  Take 1 capsule (1 mg total) by mouth at bedtime.     ticagrelor 90 MG Tabs tablet  Commonly known as:  BRILINTA  Take 1 tablet (90 mg total) by mouth 2 (two) times daily.     zolpidem 5 MG tablet  Commonly known as:  AMBIEN  Take 1 tablet (5 mg total) by mouth at bedtime as needed for sleep.        Allergies  Allergen Reactions  . Ace Inhibitors     No personal hx reaction, but 2 family members have had persistent cough with the class and he prefers not to take ACEi.    Past Medical History  Diagnosis Date  . Hemorrhoids   . Tinnitus   . Hearing loss   . HH (hiatus hernia)   . GERD (gastroesophageal reflux disease)   . OSA (obstructive sleep apnea)     a. mild not requiring CPAP at this time  .  CAD (coronary artery disease)     a. inf STEMI s/p BMS to mRCA (02/03/14)  b. inf STEMI s/p PCTA to RCA with slow reflow.  . Ischemic cardiomyopathy     a. ECHO 01/2014 EF 45-50% and akinesis of the entire inferior myocardium. Mild MR.   b. now resolved (EF 55-60% on 04/22/15)    . Varicose veins   . PAF (paroxysmal atrial fibrillation) (HCC)     a. brief episode after cardiac cath on 05/16/15- resolved after IV amiodarone.  . Chronic diastolic CHF (congestive heart failure) (HCC)     a. 2D echo on 05/18/15 LVEF 55-60%, akinesis basal inferior wall, G2DD   . Metabolic syndrome     Past Surgical History  Procedure Laterality Date  . Colonoscopy  2006    Dr. Elnoria Howard  . Coronary angioplasty with stent placement  02/02/14    BMS to RCA  . Left heart catheterization with coronary angiogram N/A 02/02/2014    Procedure: LEFT HEART CATHETERIZATION WITH CORONARY ANGIOGRAM;  Surgeon: Armaan Pond M Swaziland, MD;  Location: [facility] CATH LAB;  Service: Cardiovascular;  Laterality: N/A;  . Endovenous ablation saphenous vein w/ laser Right 10-12-2014     EVLA right greater saphenous vein by Gretta Began MD  . Carpal tunnel release Bilateral 03/02/2015    Procedure: BILATERAL CARPAL TUNNEL RELEASE ;  Surgeon: Cindee Salt, MD;  Location: [facility];  Service: Orthopedics;  Laterality: Bilateral;  . Stab phlebectomy  Right 03-15-2015    stab phlebectomy > 20 incisions (right leg) by Gretta Began MD  . Cardiac catheterization N/A 05/16/2015    Procedure: Left Heart Cath and Coronary Angiography;  Surgeon: Lyn Records, MD;  Location: [facility] INVASIVE CV LAB;  Service: Cardiovascular;  Laterality: N/A;  . Cardiac catheterization N/A 05/16/2015    Procedure: Coronary Stent Intervention;  Surgeon: Lyn Records, MD;  Location: [facility] INVASIVE CV LAB;  Service: Cardiovascular;  Laterality: N/A;    Social History   Social History  . Marital Status: Married    Spouse Name: N/A  . Number of Children: N/A  . Years of Education: N/A   Social History Main Topics  . Smoking status: Former Games developer  . Smokeless tobacco: Never Used  . Alcohol Use: 1.8 oz/week    3 Glasses of wine per week  . Drug Use: No  . Sexual Activity: Not Currently   Other Topics Concern  . None   Social History Narrative    Family History  Problem Relation Age of Onset  . Hypertension Mother   . Cancer Maternal Grandmother     Review of Systems: As noted in HPI.  All other systems were reviewed and are negative.  Physical Exam: BP 118/74 mmHg  Pulse 84  Ht  (1.676 m)  Wt 86.637 kg (191 lb)  BMI 30.84 kg/m2 Filed Weights   09/04/15 1332  Weight: 86.637 kg (191 lb)  GENERAL:  Well appearing WM in NAD HEENT:  PERRL, EOMI, sclera are clear. Oropharynx is clear. NECK:  No jugular venous distention, carotid upstroke brisk and symmetric, no bruits, no thyromegaly or adenopathy LUNGS:  Clear to auscultation bilaterally CHEST:  Unremarkable HEART:  RRR,  PMI not displaced or sustained,S1 and S2 within normal limits, no S3, no S4: no clicks, no rubs, no  murmurs ABD:  Soft, nontender. BS +, no masses or bruits. No hepatomegaly, no splenomegaly EXT:  2 + pulses throughout, no edema, + varicose veins SKIN:  Warm and dry.  No rashes NEURO:  Alert and oriented x 3. Cranial nerves II through XII intact. PSYCH:  Cognitively intact    LABORATORY DATA: Lab Results  Component Value Date   WBC 5.7 05/21/2015   HGB 12.8* 05/21/2015   HCT 36.8* 05/21/2015   PLT 196 05/21/2015   GLUCOSE 107* 05/30/2015   CHOL 115 01/03/2015   TRIG 134 01/03/2015   HDL 37* 01/03/2015   LDLCALC 51 01/03/2015   ALT 50 05/17/2015   AST 161* 05/17/2015   NA 135 05/30/2015   K 4.4 05/30/2015   CL 102 05/30/2015   CREATININE 1.12 05/30/2015   BUN 21 05/30/2015   CO2 28 05/30/2015   TSH 0.973 02/02/2014   PSA 0.90 01/03/2015   INR 1.03 02/02/2014   HGBA1C 5.8* 01/03/2015    Assessment / Plan: 1. CAD s/p inferior STEMI 02/02/14 with cardiogenic shock. S/p BMS of mid RCA. 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 Large vessel with a lot of thrombus. He is asymptomatic.  Continue DAPT. Given recurrent infarction I would recommend DAPT indefinitely. Plan to discontinue Brilinta in July. Continue statin and metoprolol.  Follow up in 6 months.  2. Ischemic cardiomyopathy with EF 45-50%. Repeat Echo showed normal LV function in October. ACEi stopped last visit due to hypotension. Continue low dose metoprolol.  3. Hyperlipidemia.   Continue high dose statin. Continue dietary modification. Will check fasting lab work now.  4. GERD

## 2015-09-04 NOTE — Patient Instructions (Signed)
Continue your current therapy  We will check fasting lab work  I will see you in 6 months   

## 2015-09-05 ENCOUNTER — Encounter (HOSPITAL_COMMUNITY)
Admission: RE | Admit: 2015-09-05 | Discharge: 2015-09-05 | Disposition: A | Discharge status: Home / Self Care | Payer: Medicare Other | Source: Ambulatory Visit | Attending: Cardiology | Admitting: Cardiology

## 2015-09-05 ENCOUNTER — Ambulatory Visit (HOSPITAL_COMMUNITY): Payer: Medicare Other

## 2015-09-05 DIAGNOSIS — Z955 Presence of coronary angioplasty implant and graft: Secondary | ICD-10-CM | POA: Diagnosis not present

## 2015-09-07 ENCOUNTER — Ambulatory Visit (HOSPITAL_COMMUNITY): Payer: Medicare Other

## 2015-09-07 ENCOUNTER — Encounter (HOSPITAL_COMMUNITY)
Admission: RE | Admit: 2015-09-07 | Discharge: 2015-09-07 | Disposition: A | Discharge status: Home / Self Care | Payer: Medicare Other | Source: Ambulatory Visit | Attending: Cardiology | Admitting: Cardiology

## 2015-09-07 ENCOUNTER — Other Ambulatory Visit: Payer: Self-pay | Admitting: Family Medicine

## 2015-09-07 DIAGNOSIS — Z955 Presence of coronary angioplasty implant and graft: Secondary | ICD-10-CM | POA: Diagnosis not present

## 2015-09-10 ENCOUNTER — Ambulatory Visit (HOSPITAL_COMMUNITY): Payer: Medicare Other

## 2015-09-10 ENCOUNTER — Encounter (HOSPITAL_COMMUNITY)
Admission: RE | Admit: 2015-09-10 | Discharge: 2015-09-10 | Disposition: A | Discharge status: Home / Self Care | Payer: Medicare Other | Source: Ambulatory Visit | Attending: Cardiology | Admitting: Cardiology

## 2015-09-10 ENCOUNTER — Other Ambulatory Visit: Payer: Self-pay | Admitting: Physician Assistant

## 2015-09-10 DIAGNOSIS — Z955 Presence of coronary angioplasty implant and graft: Secondary | ICD-10-CM | POA: Diagnosis not present

## 2015-09-10 NOTE — Telephone Encounter (Signed)
REFILL 

## 2015-09-12 ENCOUNTER — Ambulatory Visit (HOSPITAL_COMMUNITY): Payer: Medicare Other

## 2015-09-12 ENCOUNTER — Encounter (HOSPITAL_COMMUNITY): Payer: Medicare Other

## 2015-09-14 ENCOUNTER — Ambulatory Visit (HOSPITAL_COMMUNITY): Payer: Medicare Other

## 2015-09-14 ENCOUNTER — Encounter (HOSPITAL_COMMUNITY)
Admission: RE | Admit: 2015-09-14 | Discharge: 2015-09-14 | Disposition: A | Discharge status: Home / Self Care | Payer: Medicare Other | Source: Ambulatory Visit | Attending: Cardiology | Admitting: Cardiology

## 2015-09-14 DIAGNOSIS — Z955 Presence of coronary angioplasty implant and graft: Secondary | ICD-10-CM | POA: Diagnosis not present

## 2015-09-17 ENCOUNTER — Encounter (HOSPITAL_COMMUNITY)
Admission: RE | Admit: 2015-09-17 | Discharge: 2015-09-17 | Disposition: A | Discharge status: Home / Self Care | Payer: Medicare Other | Source: Ambulatory Visit | Attending: Cardiology | Admitting: Cardiology

## 2015-09-17 DIAGNOSIS — Z955 Presence of coronary angioplasty implant and graft: Secondary | ICD-10-CM | POA: Diagnosis not present

## 2015-09-19 ENCOUNTER — Encounter (HOSPITAL_COMMUNITY)
Admission: RE | Admit: 2015-09-19 | Discharge: 2015-09-19 | Disposition: A | Discharge status: Home / Self Care | Payer: Medicare Other | Source: Ambulatory Visit | Attending: Cardiology | Admitting: Cardiology

## 2015-09-19 DIAGNOSIS — Z955 Presence of coronary angioplasty implant and graft: Secondary | ICD-10-CM | POA: Diagnosis present

## 2015-09-21 ENCOUNTER — Telehealth: Payer: Self-pay | Admitting: Family Medicine

## 2015-09-21 ENCOUNTER — Encounter (HOSPITAL_COMMUNITY)
Admission: RE | Admit: 2015-09-21 | Discharge: 2015-09-21 | Disposition: A | Discharge status: Home / Self Care | Payer: Medicare Other | Source: Ambulatory Visit | Attending: Cardiology | Admitting: Cardiology

## 2015-09-21 DIAGNOSIS — Z955 Presence of coronary angioplasty implant and graft: Secondary | ICD-10-CM | POA: Diagnosis not present

## 2015-09-21 NOTE — Telephone Encounter (Signed)
Pt requesting to speak to Dr Susann Givens about the heart rehab program at [facility] that he is in. Specifically about continuing that program. Call pt at [phone]

## 2015-09-24 ENCOUNTER — Encounter (HOSPITAL_COMMUNITY): Payer: Medicare Other

## 2015-09-24 ENCOUNTER — Telehealth (HOSPITAL_COMMUNITY): Payer: Self-pay | Admitting: Family Medicine

## 2015-09-24 ENCOUNTER — Ambulatory Visit (INDEPENDENT_AMBULATORY_CARE_PROVIDER_SITE_OTHER): Payer: Medicare Other | Admitting: Gastroenterology

## 2015-09-24 ENCOUNTER — Encounter: Payer: Self-pay | Admitting: Gastroenterology

## 2015-09-24 VITALS — BP 118/70 | HR 78 | Ht 66.0 in | Wt 173.0 lb

## 2015-09-24 DIAGNOSIS — K219 Gastro-esophageal reflux disease without esophagitis: Secondary | ICD-10-CM

## 2015-09-24 NOTE — Patient Instructions (Signed)
Please return to see Dr. Christella Hartigan in 1 year, will revisit the ? Of Barrett's screening at that point.

## 2015-09-24 NOTE — Progress Notes (Signed)
Review of pertinent gastrointestinal problems: 1. Colonoscopy Dr. Elnoria Howard 03/2015 for screening: findings 37mm ascending colon polyp, sigmoid diverticulosis. Polyp was "unremarkable mucosa" on path, not precancerous polyp. He needs recall colonoscopy 03/2025.  Changed care to [facility] one month later.  HPI: This is a   Very pleasant 65 year old man whom I last saw about 5 months ago. Shortly after that appointment he had an acute MI and underwent coronary angiogram.  Chief complaint is  GERD  Had AMI 04/2015; underwent angio and stenting.  He had been taking brilinta but had stopped it about a year prior.  Probably on it now for life.  Has been on zantac bedtime, prilosec in AM before breakfast.  No dysphagia.    Overall his weight is improving on cardiac rehab.  regurge at night only once.  Eating better, healthier.     Past Medical History  Diagnosis Date  . Hemorrhoids   . Tinnitus   . Hearing loss   . HH (hiatus hernia)   . GERD (gastroesophageal reflux disease)   . OSA (obstructive sleep apnea)     a. mild not requiring CPAP at this time  . CAD (coronary artery disease)     a. inf STEMI s/p BMS to mRCA (02/03/14)  b. inf STEMI s/p PCTA to RCA with slow reflow.  . Ischemic cardiomyopathy     a. ECHO 01/2014 EF 45-50% and akinesis of the entire inferior myocardium. Mild MR.   b. now resolved (EF 55-60% on 04/22/15)    . Varicose veins   . PAF (paroxysmal atrial fibrillation) (HCC)     a. brief episode after cardiac cath on 05/16/15- resolved after IV amiodarone.  . Chronic diastolic CHF (congestive heart failure) (HCC)     a. 2D echo on 05/18/15 LVEF 55-60%, akinesis basal inferior wall, G2DD   . Metabolic syndrome     Past Surgical History  Procedure Laterality Date  . Colonoscopy  2006    Dr. Elnoria Howard  . Coronary angioplasty with stent placement  02/02/14    BMS to RCA  . Left heart catheterization with coronary angiogram N/A 02/02/2014    Procedure: LEFT HEART  CATHETERIZATION WITH CORONARY ANGIOGRAM;  Surgeon: Peter M Swaziland, MD;  Location: [facility] CATH LAB;  Service: Cardiovascular;  Laterality: N/A;  . Endovenous ablation saphenous vein w/ laser Right 10-12-2014    EVLA right greater saphenous vein by Gretta Began MD  . Carpal tunnel release Bilateral 03/02/2015    Procedure: BILATERAL CARPAL TUNNEL RELEASE ;  Surgeon: Cindee Salt, MD;  Location: [facility];  Service: Orthopedics;  Laterality: Bilateral;  . Stab phlebectomy  Right 03-15-2015    stab phlebectomy > 20 incisions (right leg) by Gretta Began MD  . Cardiac catheterization N/A 05/16/2015    Procedure: Left Heart Cath and Coronary Angiography;  Surgeon: Lyn Records, MD;  Location: [facility] INVASIVE CV LAB;  Service: Cardiovascular;  Laterality: N/A;  . Cardiac catheterization N/A 05/16/2015    Procedure: Coronary Stent Intervention;  Surgeon: Lyn Records, MD;  Location: [facility] INVASIVE CV LAB;  Service: Cardiovascular;  Laterality: N/A;    Current Outpatient Prescriptions  Medication Sig Dispense Refill  . aspirin 81 MG tablet Take 81 mg by mouth daily.    [name] atorvastatin (LIPITOR) 80 MG tablet Take 1 tablet (80 mg total) by mouth daily at 6 PM. (Patient taking differently: Take 80 mg by mouth at bedtime. ) 90 tablet 3  . chlorpheniramine-HYDROcodone (TUSSIONEX) 10-8 MG/5ML SUER Take 5  mLs by mouth every 12 (twelve) hours as needed for cough. 140 mL 0  . Melatonin 10 MG CAPS Take 10 mg by mouth at bedtime. Takes daily    . metoprolol tartrate (LOPRESSOR) 25 MG tablet TAKE 1 TABLET(25 MG) BY MOUTH TWICE DAILY 60 tablet 0  . Multiple Vitamin (MULTIVITAMIN) tablet Take 1 tablet by mouth every evening.     . nitroGLYCERIN (NITROSTAT) 0.4 MG SL tablet PLACE 1 TABLET UNDER THE TONGUE EVERY 5 MINUTES AS NEEDED FOR CHEST PAIN(FOR 2 DOSES) 25 tablet 1  . Nutritional Supplements (JUICE PLUS FIBRE PO) Take 1 tablet by mouth 2 (two) times daily. Fruit blend and vegetable blend    . omeprazole (PRILOSEC)  40 MG capsule TAKE 1 CAPSULE BY MOUTH DAILY 90 capsule 0  . ranitidine (ZANTAC) 75 MG tablet Take 75 mg by mouth at bedtime.     [name] terazosin (HYTRIN) 1 MG capsule TAKE 1 CAPSULE(1 MG) BY MOUTH AT BEDTIME 90 capsule 1  . ticagrelor (BRILINTA) 90 MG TABS tablet Take 1 tablet (90 mg total) by mouth 2 (two) times daily. 60 tablet 11  . zolpidem (AMBIEN) 5 MG tablet Take 1 tablet (5 mg total) by mouth at bedtime as needed for sleep. 30 tablet 0   No current facility-administered medications for this visit.    Allergies as of 09/24/2015 - Review Complete 09/24/2015  Allergen Reaction Noted  . Ace inhibitors  05/18/2015    Family History  Problem Relation Age of Onset  . Hypertension Mother   . Cancer Maternal Grandmother     Social History   Social History  . Marital Status: Married    Spouse Name: N/A  . Number of Children: N/A  . Years of Education: N/A   Occupational History  . Not on file.   Social History Main Topics  . Smoking status: Former Games developer  . Smokeless tobacco: Never Used  . Alcohol Use: 1.8 oz/week    3 Glasses of wine per week  . Drug Use: No  . Sexual Activity: Not Currently   Other Topics Concern  . Not on file   Social History Narrative     Physical Exam: BP 118/70 mmHg  Pulse 78  Ht  (1.676 m)  Wt 173 lb (78.472 kg)  BMI 27.94 kg/m2 Constitutional: generally well-appearing Psychiatric: alert and oriented x3 Abdomen: soft, nontender, nondistended, no obvious ascites, no peritoneal signs, normal bowel sounds   Assessment and plan: 65 y.o. male with  Chronic GERD , recent acute MI   his GERD symptoms are under much better control on the regimen which I recommended to him for 5 months ago. That is he is taking proton pump inhibitor shortly before breakfast every morning and H2 blocker at bedtime every night. He has no dysphagia. He has no real weight loss , he has no overt bleeding. Given his demographics he is at increased risk for  Barrett's esophagus and screening is indicated. However given his acute MI for 5 months ago I'm reluctant to go ahead with that right now since I still think the odds of him having Barrett's or any significant upper GI pathology is quite low. He is going to therefore continue on his current antiacid regimen and he will return to see me in about a years time. He is feeling well from a cardiac perspective then Barrett's screening may be more worthwhile.   Rob Bunting, MD [facility] 09/24/2015, 2:31 PM

## 2015-09-25 ENCOUNTER — Other Ambulatory Visit: Payer: Self-pay | Admitting: Physician Assistant

## 2015-09-26 ENCOUNTER — Encounter (HOSPITAL_COMMUNITY)
Admission: RE | Admit: 2015-09-26 | Discharge: 2015-09-26 | Disposition: A | Discharge status: Home / Self Care | Payer: Medicare Other | Source: Ambulatory Visit | Attending: Cardiology | Admitting: Cardiology

## 2015-09-26 DIAGNOSIS — Z955 Presence of coronary angioplasty implant and graft: Secondary | ICD-10-CM | POA: Diagnosis not present

## 2015-09-28 ENCOUNTER — Encounter (HOSPITAL_COMMUNITY)
Admission: RE | Admit: 2015-09-28 | Discharge: 2015-09-28 | Disposition: A | Discharge status: Home / Self Care | Payer: Medicare Other | Source: Ambulatory Visit | Attending: Cardiology | Admitting: Cardiology

## 2015-09-28 DIAGNOSIS — Z955 Presence of coronary angioplasty implant and graft: Secondary | ICD-10-CM | POA: Diagnosis not present

## 2015-10-01 ENCOUNTER — Encounter (HOSPITAL_COMMUNITY)
Admission: RE | Admit: 2015-10-01 | Discharge: 2015-10-01 | Disposition: A | Discharge status: Home / Self Care | Payer: Medicare Other | Source: Ambulatory Visit | Attending: Cardiology | Admitting: Cardiology

## 2015-10-01 DIAGNOSIS — Z955 Presence of coronary angioplasty implant and graft: Secondary | ICD-10-CM | POA: Diagnosis not present

## 2015-10-01 NOTE — Progress Notes (Signed)
Discharge Summary  Patient Details  Name: Henry Rogers MRN: [id number] Date of Birth: Dec 12, 1950 Referring Provider:  Denita Lung, MD   Number of Visits: 42  Reason for Discharge:  Patient independent in their exercise.  Smoking History:  History  Smoking status  . Former Smoker  Smokeless tobacco  . Never Used    Diagnosis:  No diagnosis found.  ADL UCSD:   Initial Exercise Prescription:   Discharge Exercise Prescription (Final Exercise Prescription Changes):   Functional Capacity:   Psychological, QOL, Others - Outcomes: PHQ 2/9: Depression screen [facility] 2/9 10/01/2015 06/13/2015 01/03/2015 01/03/2015 06/29/2014  Decreased Interest 0 0 0 0 0  Down, Depressed, Hopeless 0 0 0 0 0  PHQ - 2 Score 0 0 0 0 0    Quality of Life:   Personal Goals: Goals established at orientation with interventions provided to work toward goal.    Personal Goals Discharge: Met   Nutrition & Weight - Outcomes:    Nutrition:   Nutrition Discharge:   Education Questionnaire Score:   Goals reviewed with patient; copy given to patient. Pt graduated from cardiac rehab program today with completion of 36 exercise sessions in Phase II. Pt maintained good attendance and progressed nicely during his participation in rehab as evidenced by increased MET level.   Medication list reconciled. Repeat  PHQ score-  0.  Pt has made significant lifestyle changes and should be commended for his success. Pt feels he has achieved his goals during cardiac rehab.   Pt plans to continue exercise in cardiac maintenance program.

## 2015-10-02 ENCOUNTER — Telehealth: Payer: Self-pay | Admitting: Cardiology

## 2015-10-02 ENCOUNTER — Encounter (HOSPITAL_COMMUNITY)
Admission: RE | Admit: 2015-10-02 | Discharge: 2015-10-02 | Disposition: A | Discharge status: Home / Self Care | Payer: Self-pay | Source: Ambulatory Visit | Attending: Cardiology | Admitting: Cardiology

## 2015-10-02 DIAGNOSIS — Z955 Presence of coronary angioplasty implant and graft: Secondary | ICD-10-CM | POA: Insufficient documentation

## 2015-10-02 NOTE — Telephone Encounter (Signed)
Notification regarding medical necessity inquiry for continued cardiac rehab, iniated by pt's insurance. See contact information below.  Routed to Dr. Swaziland, Cheryl to review.

## 2015-10-02 NOTE — Telephone Encounter (Signed)
New Message  Pt stated that per [facility], Dr Swaziland needs to call below # in order for pt to continue cardiac rehab- intesnive maintenance w/ [facility]. Please call back and discuss.   Physician # [phone]

## 2015-10-03 ENCOUNTER — Encounter (HOSPITAL_COMMUNITY): Payer: Medicare Other

## 2015-10-03 NOTE — Telephone Encounter (Signed)
Returned call to patient 10/05/15.He stated he just finished 12 weeks of cardiac rehab.Stated he wants to attend maintenance program.Stated he will call his insurance to see if covered.He will call me back if I need to call insurance.

## 2015-10-04 ENCOUNTER — Encounter (HOSPITAL_COMMUNITY)
Admission: RE | Admit: 2015-10-04 | Discharge: 2015-10-04 | Disposition: A | Discharge status: Home / Self Care | Payer: Self-pay | Source: Ambulatory Visit | Attending: Cardiology | Admitting: Cardiology

## 2015-10-04 ENCOUNTER — Telehealth: Payer: Self-pay | Admitting: Cardiology

## 2015-10-04 NOTE — Telephone Encounter (Signed)
error 

## 2015-10-05 ENCOUNTER — Encounter (HOSPITAL_COMMUNITY): Payer: Medicare Other

## 2015-10-08 ENCOUNTER — Encounter (HOSPITAL_COMMUNITY)
Admission: RE | Admit: 2015-10-08 | Discharge: 2015-10-08 | Disposition: A | Discharge status: Home / Self Care | Payer: Self-pay | Source: Ambulatory Visit | Attending: Cardiology | Admitting: Cardiology

## 2015-10-08 ENCOUNTER — Encounter (HOSPITAL_COMMUNITY): Payer: Medicare Other

## 2015-10-09 ENCOUNTER — Encounter (HOSPITAL_COMMUNITY): Payer: Self-pay

## 2015-10-10 ENCOUNTER — Encounter (HOSPITAL_COMMUNITY): Payer: Medicare Other

## 2015-10-11 ENCOUNTER — Encounter (HOSPITAL_COMMUNITY)
Admission: RE | Admit: 2015-10-11 | Discharge: 2015-10-11 | Disposition: A | Discharge status: Home / Self Care | Payer: Self-pay | Source: Ambulatory Visit | Attending: Cardiology | Admitting: Cardiology

## 2015-10-12 ENCOUNTER — Encounter (HOSPITAL_COMMUNITY): Payer: Medicare Other

## 2015-10-15 ENCOUNTER — Encounter (HOSPITAL_COMMUNITY)
Admission: RE | Admit: 2015-10-15 | Discharge: 2015-10-15 | Disposition: A | Discharge status: Home / Self Care | Payer: Self-pay | Source: Ambulatory Visit | Attending: Cardiology | Admitting: Cardiology

## 2015-10-16 ENCOUNTER — Encounter (HOSPITAL_COMMUNITY): Payer: Self-pay

## 2015-10-18 ENCOUNTER — Encounter (HOSPITAL_COMMUNITY): Payer: Self-pay

## 2015-10-22 ENCOUNTER — Encounter (HOSPITAL_COMMUNITY)
Admission: RE | Admit: 2015-10-22 | Discharge: 2015-10-22 | Disposition: A | Discharge status: Home / Self Care | Payer: Self-pay | Source: Ambulatory Visit | Attending: Cardiology | Admitting: Cardiology

## 2015-10-22 DIAGNOSIS — Z955 Presence of coronary angioplasty implant and graft: Secondary | ICD-10-CM | POA: Insufficient documentation

## 2015-10-23 ENCOUNTER — Encounter (HOSPITAL_COMMUNITY): Payer: Self-pay

## 2015-10-25 ENCOUNTER — Encounter (HOSPITAL_COMMUNITY): Payer: Self-pay

## 2015-10-26 ENCOUNTER — Other Ambulatory Visit: Payer: Self-pay | Admitting: Family Medicine

## 2015-10-29 ENCOUNTER — Encounter (HOSPITAL_COMMUNITY)
Admission: RE | Admit: 2015-10-29 | Discharge: 2015-10-29 | Disposition: A | Discharge status: Home / Self Care | Payer: Self-pay | Source: Ambulatory Visit | Attending: Cardiology | Admitting: Cardiology

## 2015-10-30 ENCOUNTER — Encounter (HOSPITAL_COMMUNITY): Payer: Self-pay

## 2015-11-01 ENCOUNTER — Encounter (HOSPITAL_COMMUNITY): Payer: Self-pay

## 2015-11-05 ENCOUNTER — Encounter (HOSPITAL_COMMUNITY)
Admission: RE | Admit: 2015-11-05 | Discharge: 2015-11-05 | Disposition: A | Discharge status: Home / Self Care | Payer: Self-pay | Source: Ambulatory Visit | Attending: Cardiology | Admitting: Cardiology

## 2015-11-06 ENCOUNTER — Encounter (HOSPITAL_COMMUNITY)
Admission: RE | Admit: 2015-11-06 | Discharge: 2015-11-06 | Disposition: A | Discharge status: Home / Self Care | Payer: Self-pay | Source: Ambulatory Visit | Attending: Cardiology | Admitting: Cardiology

## 2015-11-08 ENCOUNTER — Encounter (HOSPITAL_COMMUNITY)
Admission: RE | Admit: 2015-11-08 | Discharge: 2015-11-08 | Disposition: A | Discharge status: Home / Self Care | Payer: Self-pay | Source: Ambulatory Visit | Attending: Cardiology | Admitting: Cardiology

## 2015-11-12 ENCOUNTER — Encounter (HOSPITAL_COMMUNITY)
Admission: RE | Admit: 2015-11-12 | Discharge: 2015-11-12 | Disposition: A | Discharge status: Home / Self Care | Payer: Self-pay | Source: Ambulatory Visit | Attending: Cardiology | Admitting: Cardiology

## 2015-11-13 ENCOUNTER — Encounter (HOSPITAL_COMMUNITY)
Admission: RE | Admit: 2015-11-13 | Discharge: 2015-11-13 | Disposition: A | Discharge status: Home / Self Care | Payer: Self-pay | Source: Ambulatory Visit | Attending: Cardiology | Admitting: Cardiology

## 2015-11-15 ENCOUNTER — Encounter (HOSPITAL_COMMUNITY)
Admission: RE | Admit: 2015-11-15 | Discharge: 2015-11-15 | Disposition: A | Discharge status: Home / Self Care | Payer: Self-pay | Source: Ambulatory Visit | Attending: Cardiology | Admitting: Cardiology

## 2015-11-19 ENCOUNTER — Encounter (HOSPITAL_COMMUNITY)
Admission: RE | Admit: 2015-11-19 | Discharge: 2015-11-19 | Disposition: A | Discharge status: Home / Self Care | Payer: Self-pay | Source: Ambulatory Visit | Attending: Cardiology | Admitting: Cardiology

## 2015-11-19 DIAGNOSIS — Z955 Presence of coronary angioplasty implant and graft: Secondary | ICD-10-CM | POA: Insufficient documentation

## 2015-11-20 ENCOUNTER — Encounter (HOSPITAL_COMMUNITY): Payer: Self-pay

## 2015-11-22 ENCOUNTER — Encounter (HOSPITAL_COMMUNITY): Payer: Self-pay

## 2015-11-23 ENCOUNTER — Encounter (HOSPITAL_COMMUNITY)
Admission: RE | Admit: 2015-11-23 | Discharge: 2015-11-23 | Disposition: A | Discharge status: Home / Self Care | Payer: Self-pay | Source: Ambulatory Visit | Attending: Cardiology | Admitting: Cardiology

## 2015-11-26 ENCOUNTER — Encounter (HOSPITAL_COMMUNITY): Payer: Self-pay

## 2015-11-27 ENCOUNTER — Encounter (HOSPITAL_COMMUNITY): Payer: Self-pay

## 2015-11-29 ENCOUNTER — Encounter (HOSPITAL_COMMUNITY): Payer: Self-pay

## 2015-12-03 ENCOUNTER — Encounter (HOSPITAL_COMMUNITY)
Admission: RE | Admit: 2015-12-03 | Discharge: 2015-12-03 | Disposition: A | Discharge status: Home / Self Care | Payer: Self-pay | Source: Ambulatory Visit | Attending: Cardiology | Admitting: Cardiology

## 2015-12-04 ENCOUNTER — Encounter (HOSPITAL_COMMUNITY): Payer: Self-pay

## 2015-12-05 ENCOUNTER — Telehealth: Payer: Self-pay | Admitting: Family Medicine

## 2015-12-05 ENCOUNTER — Encounter (HOSPITAL_COMMUNITY)
Admission: RE | Admit: 2015-12-05 | Discharge: 2015-12-05 | Disposition: A | Discharge status: Home / Self Care | Payer: Self-pay | Source: Ambulatory Visit | Attending: Cardiology | Admitting: Cardiology

## 2015-12-05 LAB — LIPID PANEL
Cholesterol: 119 mg/dL — ABNORMAL LOW (ref 125–200)
HDL: 46 mg/dL (ref >=40)
LDL Cholesterol: 57 mg/dL (ref <130)
Total CHOL/HDL Ratio: 2.6 Ratio (ref <=5.0)
Triglycerides: 78 mg/dL (ref <150)
VLDL: 16 mg/dL (ref <30)

## 2015-12-05 LAB — BASIC METABOLIC PANEL
BUN: 19 mg/dL (ref 7–25)
CO2: 29 mmol/L (ref 20–31)
Calcium: 9.5 mg/dL (ref 8.6–10.3)
Chloride: 103 mmol/L (ref 98–110)
Creat: 1.12 mg/dL (ref 0.70–1.25)
Glucose, Bld: 110 mg/dL — ABNORMAL HIGH (ref 65–99)
Potassium: 4.7 mmol/L (ref 3.5–5.3)
Sodium: 138 mmol/L (ref 135–146)

## 2015-12-05 LAB — HEPATIC FUNCTION PANEL
ALT: 33 U/L (ref 9–46)
AST: 28 U/L (ref 10–35)
Albumin: 4.3 g/dL (ref 3.6–5.1)
Alkaline Phosphatase: 62 U/L (ref 40–115)
Bilirubin, Direct: 0.2 mg/dL
Indirect Bilirubin: 0.4 mg/dL (ref 0.2–1.2)
Total Bilirubin: 0.6 mg/dL (ref 0.2–1.2)
Total Protein: 6.6 g/dL (ref 6.1–8.1)

## 2015-12-05 NOTE — Telephone Encounter (Signed)
Please work on this for him

## 2015-12-05 NOTE — Telephone Encounter (Signed)
Pt called concerning the new medication he was put on to help with his urinary issues. He states that it help for a while but he is back to having to get up twice a night. Pt states that so far all the meds he has tried for this issue work for a while and then problems return. Pt states that at one time he was given cialis daily but insurance would not pay for because he had not tried anything else. He states that maybe since this is the third medication he has tried and failed his insurance might approve. Please advise pt. He can be reached at [phone] and uses [facility] on [address].

## 2015-12-06 ENCOUNTER — Encounter (HOSPITAL_COMMUNITY): Payer: Self-pay

## 2015-12-09 NOTE — Telephone Encounter (Signed)
Resubmitted P.A. CIALIS with tried and failed medications

## 2015-12-10 ENCOUNTER — Encounter (HOSPITAL_COMMUNITY): Payer: Self-pay

## 2015-12-11 ENCOUNTER — Encounter (HOSPITAL_COMMUNITY): Payer: Self-pay

## 2015-12-11 NOTE — Telephone Encounter (Signed)
P.A. approved til 07/20/16, called pharmacy & went thru for $64, called pt & left message

## 2015-12-13 ENCOUNTER — Encounter (HOSPITAL_COMMUNITY): Payer: Self-pay

## 2015-12-18 ENCOUNTER — Encounter (HOSPITAL_COMMUNITY): Payer: Self-pay

## 2015-12-20 ENCOUNTER — Encounter (HOSPITAL_COMMUNITY): Payer: Self-pay

## 2015-12-20 DIAGNOSIS — Z955 Presence of coronary angioplasty implant and graft: Secondary | ICD-10-CM | POA: Insufficient documentation

## 2015-12-24 ENCOUNTER — Encounter (HOSPITAL_COMMUNITY)
Admission: RE | Admit: 2015-12-24 | Discharge: 2015-12-24 | Disposition: A | Discharge status: Home / Self Care | Payer: Self-pay | Source: Ambulatory Visit | Attending: Cardiology | Admitting: Cardiology

## 2015-12-25 ENCOUNTER — Encounter (HOSPITAL_COMMUNITY): Payer: Self-pay

## 2015-12-26 ENCOUNTER — Encounter (HOSPITAL_COMMUNITY)
Admission: RE | Admit: 2015-12-26 | Discharge: 2015-12-26 | Disposition: A | Discharge status: Home / Self Care | Payer: Self-pay | Source: Ambulatory Visit | Attending: Cardiology | Admitting: Cardiology

## 2015-12-27 ENCOUNTER — Encounter (HOSPITAL_COMMUNITY): Payer: Self-pay

## 2015-12-31 ENCOUNTER — Encounter (HOSPITAL_COMMUNITY): Payer: Self-pay

## 2016-01-01 ENCOUNTER — Encounter (HOSPITAL_COMMUNITY): Payer: Self-pay

## 2016-01-03 ENCOUNTER — Encounter (HOSPITAL_COMMUNITY): Payer: Self-pay

## 2016-01-04 ENCOUNTER — Encounter (HOSPITAL_COMMUNITY)
Admission: RE | Admit: 2016-01-04 | Discharge: 2016-01-04 | Disposition: A | Discharge status: Home / Self Care | Payer: Self-pay | Source: Ambulatory Visit | Attending: Cardiology | Admitting: Cardiology

## 2016-01-07 ENCOUNTER — Encounter (HOSPITAL_COMMUNITY): Payer: Self-pay

## 2016-01-08 ENCOUNTER — Encounter (HOSPITAL_COMMUNITY): Payer: Self-pay

## 2016-01-09 ENCOUNTER — Telehealth: Payer: Self-pay | Admitting: Family Medicine

## 2016-01-09 ENCOUNTER — Encounter (HOSPITAL_COMMUNITY): Payer: Self-pay

## 2016-01-09 NOTE — Telephone Encounter (Signed)
Pt is coming in for CPE on Friday.He states that he had fasting labs with his cardiology doctor about a week ago so he was not planning to be fasting unless Dr Susann Givens needs him to be fasting for labs again. Call pt on his cell # if he should come in fasting.

## 2016-01-09 NOTE — Telephone Encounter (Signed)
No need to fast

## 2016-01-10 ENCOUNTER — Encounter (HOSPITAL_COMMUNITY): Payer: Self-pay

## 2016-01-10 NOTE — Telephone Encounter (Signed)
Left message with wife no need to fast

## 2016-01-11 ENCOUNTER — Ambulatory Visit (INDEPENDENT_AMBULATORY_CARE_PROVIDER_SITE_OTHER): Payer: Medicare Other | Admitting: Family Medicine

## 2016-01-11 ENCOUNTER — Encounter (HOSPITAL_COMMUNITY)
Admission: RE | Admit: 2016-01-11 | Discharge: 2016-01-11 | Disposition: A | Discharge status: Home / Self Care | Payer: Self-pay | Source: Ambulatory Visit | Attending: Cardiology | Admitting: Cardiology

## 2016-01-11 ENCOUNTER — Encounter: Payer: Self-pay | Admitting: Family Medicine

## 2016-01-11 VITALS — BP 110/70 | HR 74 | Ht 66.5 in | Wt 186.8 lb

## 2016-01-11 DIAGNOSIS — Z87891 Personal history of nicotine dependence: Secondary | ICD-10-CM | POA: Diagnosis not present

## 2016-01-11 DIAGNOSIS — E785 Hyperlipidemia, unspecified: Secondary | ICD-10-CM

## 2016-01-11 DIAGNOSIS — K219 Gastro-esophageal reflux disease without esophagitis: Secondary | ICD-10-CM | POA: Diagnosis not present

## 2016-01-11 DIAGNOSIS — H9193 Unspecified hearing loss, bilateral: Secondary | ICD-10-CM

## 2016-01-11 DIAGNOSIS — I2510 Atherosclerotic heart disease of native coronary artery without angina pectoris: Secondary | ICD-10-CM

## 2016-01-11 DIAGNOSIS — G4733 Obstructive sleep apnea (adult) (pediatric): Secondary | ICD-10-CM | POA: Diagnosis not present

## 2016-01-11 DIAGNOSIS — N400 Enlarged prostate without lower urinary tract symptoms: Secondary | ICD-10-CM

## 2016-01-11 DIAGNOSIS — E8881 Metabolic syndrome: Secondary | ICD-10-CM | POA: Diagnosis not present

## 2016-01-11 LAB — POCT URINALYSIS DIPSTICK
Bilirubin, UA: NEGATIVE
Blood, UA: NEGATIVE
Glucose, UA: NEGATIVE
Ketones, UA: NEGATIVE
Leukocytes, UA: NEGATIVE
Nitrite, UA: NEGATIVE
Protein, UA: NEGATIVE
Spec Grav, UA: 1.015
Urobilinogen, UA: NEGATIVE
pH, UA: 6

## 2016-01-11 NOTE — Progress Notes (Signed)
Subjective:   HPI  Henry Rogers is a 65 y.o. male who presents for a complete physical.He continues to go to cardiac rehabilitation with his wife 3 times per week. He has had no difficulty with chest pain, weakness, shortness of breath, PND or DOE. He does have a history of BPH and has gotten good results using Cialis. Melvenia Needles has been reduced to once per night as opposed to at least 3 times per night. He also has had decrease in difficulty with urgency. He does have OSA but is not on CPAP. Apparently he is not had high enough level to qualify. His reflux is under good control using Prilosec as well as Zantac. Continues on atorvastatin and is having no trouble with that. Continues on his blood pressure/cardiac medications. He does have bilateral hearing loss and usually wears a hearing aid. His home life is going well. He is now doing mediation work. He is a former smoker. Does have a previous history of metabolic syndrome.  Medical care team includes:  [place]: card.  Early: vein  [facility]   Preventative care: Last ophthalmology visit: 07/2015 Last dental visit: 2/17 Last colonoscopy: 01/29/15 Last prostate exam: ? Last EKG:05/17/15 Last labs: 12/04/15   Prior vaccinations: TD or Tdap:01/16/09 Influenza:09/04/15 Pneumococcal: 23:01/16/09 13:01/03/15 Shingles/Zostavax:01/16/09 Other:   Advanced directive:Done MOST form filled out Concerns:   Reviewed their medical, surgical, family, social, medication, and allergy history and updated chart as appropriate.    Review of Systems Constitutional: -fever, -chills, -sweats, -unexpected weight change, -decreased appetite, -fatigue Allergy: -sneezing, -itching, -congestion Dermatology: -changing moles, --rash, -lumps ENT: -runny nose, -ear pain, -sore throat, -hoarseness, -sinus pain, -teeth pain, - ringing in ears, -hearing loss, -nosebleeds Cardiology: -chest pain, -palpitations, -swelling, -difficulty breathing when lying flat,  -waking up short of breath Respiratory: -cough, -shortness of breath, -difficulty breathing with exercise or exertion, -wheezing, -coughing up blood Gastroenterology: -abdominal pain, -nausea, -vomiting, -diarrhea, -constipation, -blood in stool, -changes in bowel movement, -difficulty swallowing or eating Hematology: -bleeding, -bruising  Musculoskeletal: -joint aches, -muscle aches, -joint swelling, -back pain, -neck pain, -cramping, -changes in gait Ophthalmology: denies vision changes, eye redness, itching, discharge Urology: -burning with urination, -difficulty urinating, -blood in urine, -urinary frequency, -urgency, -incontinence Neurology: -headache, -weakness, -tingling, -numbness, -memory loss, -falls, -dizziness Psychology: -depressed mood, -agitation, -sleep problems     Objective:   Physical Exam General appearance: alert, no distress, WD/WN,  Skin: Normal HEENT: normocephalic, conjunctiva/corneas normal, sclerae anicteric, PERRLA, EOMi, nares patent, no discharge or erythema, pharynx normal Oral cavity: MMM, tongue normal, teeth normal Neck: supple, no lymphadenopathy, no thyromegaly, no masses, normal ROM Chest: non tender, normal shape and expansion Heart: RRR, normal S1, S2, no murmurs Lungs: CTA bilaterally, no wheezes, rhonchi, or rales Abdomen: +bs, soft, non tender, non distended, no masses, no hepatomegaly, no splenomegaly, no bruits Back: non tender, normal ROM, no scoliosis Musculoskeletal: upper extremities non tender, no obvious deformity, normal ROM throughout, lower extremities non tender, no obvious deformity, normal ROM throughout Extremities: no edema, no cyanosis, no clubbing Pulses: 2+ symmetric, upper and lower extremities, normal cap refill Neurological: alert, oriented x 3, CN2-12 intact, strength normal upper extremities and lower extremities, sensation normal throughout, DTRs 2+ throughout, no cerebellar signs, gait normal Psychiatric: normal affect,  behavior normal, pleasant    Assessment and Plan :   Gastroesophageal reflux disease without esophagitis  BPH (benign prostatic hyperplasia)  Hearing loss, bilateral  Hyperlipidemia LDL goal <70  Metabolic syndrome  OSA (obstructive sleep apnea)  Coronary artery disease involving native coronary artery of native heart without angina pectoris  Former smoker  Physical exam - discussed healthy lifestyle, diet, exercise, preventative care, vaccinations, and addressed their concerns.   Encouraged him to continue with his present medications concerning blood pressure, cholesterol and heart. He will continue with his diet and exercise regimen. Recheck here in the fall.     Follow-up

## 2016-01-14 ENCOUNTER — Encounter (HOSPITAL_COMMUNITY): Payer: Self-pay

## 2016-01-14 ENCOUNTER — Encounter (HOSPITAL_COMMUNITY)
Admission: RE | Admit: 2016-01-14 | Discharge: 2016-01-14 | Disposition: A | Discharge status: Home / Self Care | Payer: Self-pay | Source: Ambulatory Visit | Attending: Cardiology | Admitting: Cardiology

## 2016-01-15 ENCOUNTER — Encounter (HOSPITAL_COMMUNITY): Payer: Self-pay

## 2016-01-16 ENCOUNTER — Encounter (HOSPITAL_COMMUNITY)
Admission: RE | Admit: 2016-01-16 | Discharge: 2016-01-16 | Disposition: A | Discharge status: Home / Self Care | Payer: Self-pay | Source: Ambulatory Visit | Attending: Cardiology | Admitting: Cardiology

## 2016-01-17 ENCOUNTER — Encounter (HOSPITAL_COMMUNITY): Payer: Self-pay

## 2016-01-18 ENCOUNTER — Encounter (HOSPITAL_COMMUNITY)
Admission: RE | Admit: 2016-01-18 | Discharge: 2016-01-18 | Disposition: A | Discharge status: Home / Self Care | Payer: Self-pay | Source: Ambulatory Visit | Attending: Cardiology | Admitting: Cardiology

## 2016-01-21 ENCOUNTER — Encounter (HOSPITAL_COMMUNITY)
Admission: RE | Admit: 2016-01-21 | Discharge: 2016-01-21 | Disposition: A | Discharge status: Home / Self Care | Payer: Self-pay | Source: Ambulatory Visit | Attending: Cardiology | Admitting: Cardiology

## 2016-01-21 ENCOUNTER — Other Ambulatory Visit: Payer: Self-pay

## 2016-01-21 ENCOUNTER — Encounter (HOSPITAL_COMMUNITY): Payer: Self-pay

## 2016-01-21 DIAGNOSIS — Z955 Presence of coronary angioplasty implant and graft: Secondary | ICD-10-CM | POA: Insufficient documentation

## 2016-01-21 MED ORDER — METOPROLOL TARTRATE 25 MG PO TABS: 25.0000 mg | ORAL_TABLET | Freq: Two times a day (BID) | ORAL | Status: DC

## 2016-01-23 ENCOUNTER — Encounter (HOSPITAL_COMMUNITY)
Admission: RE | Admit: 2016-01-23 | Discharge: 2016-01-23 | Disposition: A | Discharge status: Home / Self Care | Payer: Self-pay | Source: Ambulatory Visit | Attending: Cardiology | Admitting: Cardiology

## 2016-01-24 ENCOUNTER — Encounter (HOSPITAL_COMMUNITY): Payer: Self-pay

## 2016-01-25 ENCOUNTER — Encounter (HOSPITAL_COMMUNITY)
Admission: RE | Admit: 2016-01-25 | Discharge: 2016-01-25 | Disposition: A | Discharge status: Home / Self Care | Payer: Self-pay | Source: Ambulatory Visit | Attending: Cardiology | Admitting: Cardiology

## 2016-01-27 ENCOUNTER — Other Ambulatory Visit: Payer: Self-pay | Admitting: Family Medicine

## 2016-01-28 ENCOUNTER — Encounter (HOSPITAL_COMMUNITY): Payer: Self-pay

## 2016-01-29 ENCOUNTER — Encounter (HOSPITAL_COMMUNITY): Payer: Self-pay

## 2016-01-30 ENCOUNTER — Encounter (HOSPITAL_COMMUNITY)
Admission: RE | Admit: 2016-01-30 | Discharge: 2016-01-30 | Disposition: A | Discharge status: Home / Self Care | Payer: Self-pay | Source: Ambulatory Visit | Attending: Cardiology | Admitting: Cardiology

## 2016-01-31 ENCOUNTER — Encounter (HOSPITAL_COMMUNITY): Payer: Self-pay

## 2016-02-01 ENCOUNTER — Encounter (HOSPITAL_COMMUNITY)
Admission: RE | Admit: 2016-02-01 | Discharge: 2016-02-01 | Disposition: A | Discharge status: Home / Self Care | Payer: Self-pay | Source: Ambulatory Visit | Attending: Cardiology | Admitting: Cardiology

## 2016-02-04 ENCOUNTER — Encounter (HOSPITAL_COMMUNITY): Payer: Self-pay

## 2016-02-05 ENCOUNTER — Encounter (HOSPITAL_COMMUNITY): Payer: Self-pay

## 2016-02-06 ENCOUNTER — Encounter (HOSPITAL_COMMUNITY): Payer: Self-pay

## 2016-02-07 ENCOUNTER — Encounter (HOSPITAL_COMMUNITY): Payer: Self-pay

## 2016-02-08 ENCOUNTER — Encounter (HOSPITAL_COMMUNITY): Payer: Self-pay

## 2016-02-11 ENCOUNTER — Encounter (HOSPITAL_COMMUNITY)
Admission: RE | Admit: 2016-02-11 | Discharge: 2016-02-11 | Disposition: A | Discharge status: Home / Self Care | Payer: Self-pay | Source: Ambulatory Visit | Attending: Cardiology | Admitting: Cardiology

## 2016-02-11 ENCOUNTER — Encounter (HOSPITAL_COMMUNITY): Payer: Self-pay

## 2016-02-12 ENCOUNTER — Encounter (HOSPITAL_COMMUNITY): Payer: Self-pay

## 2016-02-13 ENCOUNTER — Encounter (HOSPITAL_COMMUNITY)
Admission: RE | Admit: 2016-02-13 | Discharge: 2016-02-13 | Disposition: A | Discharge status: Home / Self Care | Payer: Self-pay | Source: Ambulatory Visit | Attending: Cardiology | Admitting: Cardiology

## 2016-02-14 ENCOUNTER — Encounter (HOSPITAL_COMMUNITY): Payer: Self-pay

## 2016-02-15 ENCOUNTER — Encounter (HOSPITAL_COMMUNITY)
Admission: RE | Admit: 2016-02-15 | Discharge: 2016-02-15 | Disposition: A | Discharge status: Home / Self Care | Payer: Self-pay | Source: Ambulatory Visit | Attending: Cardiology | Admitting: Cardiology

## 2016-02-18 ENCOUNTER — Encounter (HOSPITAL_COMMUNITY)
Admission: RE | Admit: 2016-02-18 | Discharge: 2016-02-18 | Disposition: A | Discharge status: Home / Self Care | Payer: Self-pay | Source: Ambulatory Visit | Attending: Cardiology | Admitting: Cardiology

## 2016-02-18 ENCOUNTER — Encounter (HOSPITAL_COMMUNITY): Payer: Self-pay

## 2016-02-19 ENCOUNTER — Encounter (HOSPITAL_COMMUNITY): Payer: Self-pay

## 2016-02-20 ENCOUNTER — Encounter (HOSPITAL_COMMUNITY)
Admission: RE | Admit: 2016-02-20 | Discharge: 2016-02-20 | Disposition: A | Discharge status: Home / Self Care | Payer: Self-pay | Source: Ambulatory Visit | Attending: Cardiology | Admitting: Cardiology

## 2016-02-20 DIAGNOSIS — Z955 Presence of coronary angioplasty implant and graft: Secondary | ICD-10-CM | POA: Insufficient documentation

## 2016-02-21 ENCOUNTER — Encounter (HOSPITAL_COMMUNITY): Payer: Self-pay

## 2016-02-22 ENCOUNTER — Encounter (HOSPITAL_COMMUNITY): Payer: Self-pay

## 2016-02-25 ENCOUNTER — Encounter (HOSPITAL_COMMUNITY): Payer: Self-pay

## 2016-02-25 ENCOUNTER — Encounter (HOSPITAL_COMMUNITY)
Admission: RE | Admit: 2016-02-25 | Discharge: 2016-02-25 | Disposition: A | Discharge status: Home / Self Care | Payer: Self-pay | Source: Ambulatory Visit | Attending: Cardiology | Admitting: Cardiology

## 2016-02-26 ENCOUNTER — Encounter (HOSPITAL_COMMUNITY): Payer: Self-pay

## 2016-02-27 ENCOUNTER — Encounter (HOSPITAL_COMMUNITY)
Admission: RE | Admit: 2016-02-27 | Discharge: 2016-02-27 | Disposition: A | Discharge status: Home / Self Care | Payer: Self-pay | Source: Ambulatory Visit | Attending: Cardiology | Admitting: Cardiology

## 2016-02-28 ENCOUNTER — Encounter (HOSPITAL_COMMUNITY): Payer: Self-pay

## 2016-02-29 ENCOUNTER — Encounter (HOSPITAL_COMMUNITY)
Admission: RE | Admit: 2016-02-29 | Discharge: 2016-02-29 | Disposition: A | Discharge status: Home / Self Care | Payer: Self-pay | Source: Ambulatory Visit | Attending: Cardiology | Admitting: Cardiology

## 2016-03-03 ENCOUNTER — Encounter (HOSPITAL_COMMUNITY): Payer: Self-pay

## 2016-03-04 ENCOUNTER — Encounter (HOSPITAL_COMMUNITY): Payer: Self-pay

## 2016-03-05 ENCOUNTER — Encounter (HOSPITAL_COMMUNITY)
Admission: RE | Admit: 2016-03-05 | Discharge: 2016-03-05 | Disposition: A | Discharge status: Home / Self Care | Payer: Self-pay | Source: Ambulatory Visit | Attending: Cardiology | Admitting: Cardiology

## 2016-03-06 ENCOUNTER — Encounter (HOSPITAL_COMMUNITY): Payer: Self-pay

## 2016-03-07 ENCOUNTER — Encounter (HOSPITAL_COMMUNITY): Payer: Self-pay

## 2016-03-10 ENCOUNTER — Encounter (HOSPITAL_COMMUNITY): Payer: Self-pay

## 2016-03-11 ENCOUNTER — Encounter (HOSPITAL_COMMUNITY): Payer: Self-pay

## 2016-03-12 ENCOUNTER — Encounter (HOSPITAL_COMMUNITY)
Admission: RE | Admit: 2016-03-12 | Discharge: 2016-03-12 | Disposition: A | Discharge status: Home / Self Care | Payer: Self-pay | Source: Ambulatory Visit | Attending: Cardiology | Admitting: Cardiology

## 2016-03-13 ENCOUNTER — Encounter (HOSPITAL_COMMUNITY): Payer: Self-pay

## 2016-03-14 ENCOUNTER — Encounter (HOSPITAL_COMMUNITY): Payer: Self-pay

## 2016-03-17 ENCOUNTER — Encounter (HOSPITAL_COMMUNITY): Payer: Self-pay

## 2016-03-17 ENCOUNTER — Encounter (HOSPITAL_COMMUNITY)
Admission: RE | Admit: 2016-03-17 | Discharge: 2016-03-17 | Disposition: A | Discharge status: Home / Self Care | Payer: Self-pay | Source: Ambulatory Visit | Attending: Cardiology | Admitting: Cardiology

## 2016-03-18 ENCOUNTER — Encounter (HOSPITAL_COMMUNITY): Payer: Self-pay

## 2016-03-19 ENCOUNTER — Encounter (HOSPITAL_COMMUNITY)
Admission: RE | Admit: 2016-03-19 | Discharge: 2016-03-19 | Disposition: A | Discharge status: Home / Self Care | Payer: Self-pay | Source: Ambulatory Visit | Attending: Cardiology | Admitting: Cardiology

## 2016-03-20 ENCOUNTER — Encounter (HOSPITAL_COMMUNITY): Payer: Self-pay

## 2016-03-21 ENCOUNTER — Encounter (HOSPITAL_COMMUNITY)
Admission: RE | Admit: 2016-03-21 | Discharge: 2016-03-21 | Disposition: A | Discharge status: Home / Self Care | Payer: Self-pay | Source: Ambulatory Visit | Attending: Cardiology | Admitting: Cardiology

## 2016-03-21 DIAGNOSIS — Z955 Presence of coronary angioplasty implant and graft: Secondary | ICD-10-CM | POA: Insufficient documentation

## 2016-03-25 ENCOUNTER — Encounter (HOSPITAL_COMMUNITY): Payer: Self-pay

## 2016-03-26 ENCOUNTER — Encounter (HOSPITAL_COMMUNITY): Payer: Self-pay

## 2016-03-27 ENCOUNTER — Encounter (HOSPITAL_COMMUNITY): Payer: Self-pay

## 2016-03-28 ENCOUNTER — Encounter (HOSPITAL_COMMUNITY): Payer: Self-pay

## 2016-03-31 ENCOUNTER — Encounter (HOSPITAL_COMMUNITY): Payer: Self-pay

## 2016-03-31 ENCOUNTER — Encounter (HOSPITAL_COMMUNITY)
Admission: RE | Admit: 2016-03-31 | Discharge: 2016-03-31 | Disposition: A | Discharge status: Home / Self Care | Payer: Self-pay | Source: Ambulatory Visit | Attending: Cardiology | Admitting: Cardiology

## 2016-04-01 ENCOUNTER — Encounter (HOSPITAL_COMMUNITY): Payer: Self-pay

## 2016-04-02 ENCOUNTER — Encounter (HOSPITAL_COMMUNITY)
Admission: RE | Admit: 2016-04-02 | Discharge: 2016-04-02 | Disposition: A | Discharge status: Home / Self Care | Payer: Self-pay | Source: Ambulatory Visit | Attending: Cardiology | Admitting: Cardiology

## 2016-04-02 ENCOUNTER — Encounter: Payer: Self-pay | Admitting: Cardiology

## 2016-04-03 ENCOUNTER — Encounter (HOSPITAL_COMMUNITY): Payer: Self-pay

## 2016-04-04 ENCOUNTER — Encounter (HOSPITAL_COMMUNITY)
Admission: RE | Admit: 2016-04-04 | Discharge: 2016-04-04 | Disposition: A | Discharge status: Home / Self Care | Payer: Self-pay | Source: Ambulatory Visit | Attending: Cardiology | Admitting: Cardiology

## 2016-04-07 ENCOUNTER — Encounter (HOSPITAL_COMMUNITY)
Admission: RE | Admit: 2016-04-07 | Discharge: 2016-04-07 | Disposition: A | Discharge status: Home / Self Care | Payer: Self-pay | Source: Ambulatory Visit | Attending: Cardiology | Admitting: Cardiology

## 2016-04-07 ENCOUNTER — Encounter (HOSPITAL_COMMUNITY): Payer: Self-pay

## 2016-04-08 ENCOUNTER — Encounter (HOSPITAL_COMMUNITY): Payer: Self-pay

## 2016-04-09 ENCOUNTER — Encounter (HOSPITAL_COMMUNITY)
Admission: RE | Admit: 2016-04-09 | Discharge: 2016-04-09 | Disposition: A | Discharge status: Home / Self Care | Payer: Self-pay | Source: Ambulatory Visit | Attending: Cardiology | Admitting: Cardiology

## 2016-04-10 ENCOUNTER — Encounter (HOSPITAL_COMMUNITY): Payer: Self-pay

## 2016-04-11 ENCOUNTER — Encounter (HOSPITAL_COMMUNITY): Payer: Self-pay

## 2016-04-14 ENCOUNTER — Encounter (HOSPITAL_COMMUNITY)
Admission: RE | Admit: 2016-04-14 | Discharge: 2016-04-14 | Disposition: A | Discharge status: Home / Self Care | Payer: Self-pay | Source: Ambulatory Visit | Attending: Cardiology | Admitting: Cardiology

## 2016-04-14 ENCOUNTER — Encounter (HOSPITAL_COMMUNITY): Payer: Self-pay

## 2016-04-15 ENCOUNTER — Encounter (HOSPITAL_COMMUNITY): Payer: Self-pay

## 2016-04-16 ENCOUNTER — Encounter (HOSPITAL_COMMUNITY)
Admission: RE | Admit: 2016-04-16 | Discharge: 2016-04-16 | Disposition: A | Discharge status: Home / Self Care | Payer: Self-pay | Source: Ambulatory Visit | Attending: Cardiology | Admitting: Cardiology

## 2016-04-16 NOTE — Progress Notes (Signed)
Henry Rogers Date of Birth: 22-Apr-1951 Medical Record #[id number]  History of Present Illness: Henry Rogers is seen for follow up CAD. He is s/p inferior STEMI on 02/02/14. The Mid RCA was occluded with large thrombus load. He had cardiogenic shock. The RCA was stented with a 5.0x16 mm Veriflex stent. EF was 45-50%. Other vessels had no significant disease. Repeat Echo in September 2015 showed that his LV function had normalized. EF 55-60%.  He was readmitted on May 06, 2015 with recurrent inferior infarct again associated with cardiogenic shock. The stent in the mid RCA was patent but the distal RCA was occluded with heavy thrombus. This was treated with POBA only with a 5.0 mm balloon.  Since then he has done very well.  No chest pain or SOB. He is doing [facility] training at [facility]. His BP has been well controlled. He is tolerating his medication well.     Medication List       Accurate as of 04/17/16 10:37 AM. Always use your most recent med list.          aspirin 81 MG tablet Take 81 mg by mouth daily.   atorvastatin 80 MG tablet Commonly known as:  LIPITOR TAKE 1 TABLET(80 MG) BY MOUTH DAILY 6 PM   CIALIS 5 MG tablet Generic drug:  tadalafil Take 1 tablet by mouth at bedtime.   JUICE PLUS FIBRE PO Take 1 tablet by mouth 2 (two) times daily. Fruit blend and vegetable blend   Melatonin 10 MG Caps Take 10 mg by mouth at bedtime. Takes daily   metoprolol tartrate 25 MG tablet Commonly known as:  LOPRESSOR Take 1 tablet (25 mg total) by mouth 2 (two) times daily.   multivitamin tablet Take 1 tablet by mouth every evening.   nitroGLYCERIN 0.4 MG SL tablet Commonly known as:  NITROSTAT PLACE 1 TABLET UNDER THE TONGUE EVERY 5 MINUTES AS NEEDED FOR CHEST PAIN(FOR 2 DOSES)   omeprazole 40 MG capsule Commonly known as:  PRILOSEC TAKE 1 CAPSULE BY MOUTH DAILY   ranitidine 75 MG tablet Commonly known as:  ZANTAC Take 75 mg by mouth at bedtime.     ticagrelor 60 MG Tabs tablet Commonly known as:  BRILINTA Take 1 tablet (60 mg total) by mouth 2 (two) times daily.   zolpidem 5 MG tablet Commonly known as:  AMBIEN Take 1 tablet (5 mg total) by mouth at bedtime as needed for sleep.       Allergies  Allergen Reactions  . Ace Inhibitors     No personal hx reaction, but 2 family members have had persistent cough with the class and he prefers not to take ACEi.    Past Medical History:  Diagnosis Date  . CAD (coronary artery disease)    a. inf STEMI s/p BMS to mRCA (02/03/14)  b. inf STEMI s/p PCTA to RCA with slow reflow.  . Chronic diastolic CHF (congestive heart failure) (HCC)    a. 2D echo on 05/18/15 LVEF 55-60%, akinesis basal inferior wall, G2DD   . GERD (gastroesophageal reflux disease)   . Hearing loss   . Hemorrhoids   . HH (hiatus hernia)   . Ischemic cardiomyopathy    a. ECHO 01/2014 EF 45-50% and akinesis of the entire inferior myocardium. Mild MR.   b. now resolved (EF 55-60% on 04/22/15)    . Metabolic syndrome   . OSA (obstructive sleep apnea)    a. mild not requiring CPAP at this time  .  PAF (paroxysmal atrial fibrillation) (HCC)    a. brief episode after cardiac cath on 05/16/15- resolved after IV amiodarone.  . Tinnitus   . Varicose veins     Past Surgical History:  Procedure Laterality Date  . CARDIAC CATHETERIZATION N/A 05/16/2015   Procedure: Left Heart Cath and Coronary Angiography;  Surgeon: Henry RecordsHenry W Smith, Rogers;  [facility];  Service: Cardiovascular;  Laterality: N/A;  . CARDIAC CATHETERIZATION N/A 05/16/2015   Procedure: Coronary Stent Intervention;  Surgeon: Henry RecordsHenry W Smith, Rogers;  [facility];  Service: Cardiovascular;  Laterality: N/A;  . CARPAL TUNNEL RELEASE Bilateral 03/02/2015   Procedure: BILATERAL CARPAL TUNNEL RELEASE ;  Surgeon: Henry SaltGary Kuzma, Rogers;  Location: [facility];  Service: Orthopedics;  Laterality: Bilateral;  . COLONOSCOPY  2006   Henry Rogers   . CORONARY ANGIOPLASTY WITH STENT PLACEMENT  02/02/14   BMS to RCA  . ENDOVENOUS ABLATION SAPHENOUS VEIN W/ LASER Right 10-12-2014   EVLA right greater saphenous vein by Henry Rogers  . LEFT HEART CATHETERIZATION WITH CORONARY ANGIOGRAM N/A 02/02/2014   Procedure: LEFT HEART CATHETERIZATION WITH CORONARY ANGIOGRAM;  Surgeon: Henry Barra M SwazilandJordan, Rogers;  [facility];  Service: Cardiovascular;  Laterality: N/A;  . stab phlebectomy  Right 03-15-2015   stab phlebectomy > 20 incisions (right leg) by Henry Rogers    Social History   Social History  . Marital status: Married    Spouse name: N/A  . Number of children: N/A  . Years of education: N/A   Social History Main Topics  . Smoking status: Former Games developermoker  . Smokeless tobacco: Never Used  . Alcohol use 1.8 oz/week    3 Glasses of wine per week  . Drug use: No  . Sexual activity: Not Currently   Other Topics Concern  . None   Social History Narrative  . None    Family History  Problem Relation Age of Onset  . Hypertension Mother   . Cancer Maternal Grandmother     Review of Systems: As noted in HPI.  All other systems were reviewed and are negative.  Physical Exam: BP 118/88   Pulse 61   Ht 5\' 6"  (1.676 m)   Wt 186 lb 3.2 oz (84.5 kg)   BMI 30.05 kg/m  Filed Weights   04/17/16 1006  Weight: 186 lb 3.2 oz (84.5 kg)  GENERAL:  Well appearing WM in NAD HEENT:  PERRL, EOMI, sclera are clear. Oropharynx is clear. NECK:  No jugular venous distention, carotid upstroke brisk and symmetric, no bruits, no thyromegaly or adenopathy LUNGS:  Clear to auscultation bilaterally CHEST:  Unremarkable HEART:  RRR,  PMI not displaced or sustained,S1 and S2 within normal limits, no S3, no S4: no clicks, no rubs, no murmurs ABD:  Soft, nontender. BS +, no masses or bruits. No hepatomegaly, no splenomegaly EXT:  2 + pulses throughout, no edema, + varicose veins SKIN:  Warm and dry.  No rashes NEURO:  Alert and oriented x 3.  Cranial nerves II through XII intact. PSYCH:  Cognitively intact    LABORATORY DATA: Lab Results  Component Value Date   WBC 5.7 05/21/2015   HGB 12.8 (L) 05/21/2015   HCT 36.8 (L) 05/21/2015   PLT 196 05/21/2015   GLUCOSE 110 (H) 12/04/2015   CHOL 119 (L) 12/04/2015   TRIG 78 12/04/2015   HDL 46 12/04/2015   LDLCALC 57 12/04/2015   ALT 33 12/04/2015   AST  28 12/04/2015   NA 138 12/04/2015   K 4.7 12/04/2015   CL 103 12/04/2015   CREATININE 1.12 12/04/2015   BUN 19 12/04/2015   CO2 29 12/04/2015   TSH 0.973 02/02/2014   PSA 0.90 01/03/2015   INR 1.03 02/02/2014   HGBA1C 5.8 (H) 01/03/2015   Ecg today shows NSR with old inferior infarct. No change. I have personally reviewed and interpreted this study.  Assessment / Plan: 1. CAD s/p inferior STEMI 02/02/14 with cardiogenic shock. S/p BMS of mid RCA. 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 Large vessel with a lot of thrombus. He is asymptomatic.   Given recurrent infarction I would recommend DAPT indefinitely but will reduce Brilinta to 60 mg bid.  Continue statin and metoprolol.  Follow up in 6 months.  2. Ischemic cardiomyopathy with EF 45-50%. Repeat Echo showed normal LV function in October. ACEi stopped before due to hypotension. Continue low dose metoprolol.  3. Hyperlipidemia.   Continue high dose statin. Continue dietary modification. Lipids look excellent.  4. GERD

## 2016-04-17 ENCOUNTER — Encounter (HOSPITAL_COMMUNITY): Payer: Self-pay

## 2016-04-17 ENCOUNTER — Ambulatory Visit (INDEPENDENT_AMBULATORY_CARE_PROVIDER_SITE_OTHER): Payer: Medicare Other | Admitting: Cardiology

## 2016-04-17 ENCOUNTER — Encounter: Payer: Self-pay | Admitting: Cardiology

## 2016-04-17 VITALS — BP 118/88 | HR 61 | Ht 66.0 in | Wt 186.2 lb

## 2016-04-17 DIAGNOSIS — I2510 Atherosclerotic heart disease of native coronary artery without angina pectoris: Secondary | ICD-10-CM

## 2016-04-17 DIAGNOSIS — E785 Hyperlipidemia, unspecified: Secondary | ICD-10-CM | POA: Diagnosis not present

## 2016-04-17 MED ORDER — TICAGRELOR 60 MG PO TABS: 60.0000 mg | ORAL_TABLET | Freq: Two times a day (BID) | ORAL | 3 refills | Status: DC

## 2016-04-17 NOTE — Patient Instructions (Addendum)
We will reduce Brilinta to 60 mg twice a day  Continue your other therapy  I will see you in 6  Months.

## 2016-04-18 ENCOUNTER — Encounter (HOSPITAL_COMMUNITY)
Admission: RE | Admit: 2016-04-18 | Discharge: 2016-04-18 | Disposition: A | Discharge status: Home / Self Care | Payer: Self-pay | Source: Ambulatory Visit | Attending: Cardiology | Admitting: Cardiology

## 2016-04-21 ENCOUNTER — Encounter (HOSPITAL_COMMUNITY)
Admission: RE | Admit: 2016-04-21 | Discharge: 2016-04-21 | Disposition: A | Discharge status: Home / Self Care | Payer: Self-pay | Source: Ambulatory Visit | Attending: Cardiology | Admitting: Cardiology

## 2016-04-21 DIAGNOSIS — Z955 Presence of coronary angioplasty implant and graft: Secondary | ICD-10-CM | POA: Insufficient documentation

## 2016-04-23 ENCOUNTER — Encounter (HOSPITAL_COMMUNITY)
Admission: RE | Admit: 2016-04-23 | Discharge: 2016-04-23 | Disposition: A | Discharge status: Home / Self Care | Payer: Self-pay | Source: Ambulatory Visit | Attending: Cardiology | Admitting: Cardiology

## 2016-04-28 ENCOUNTER — Encounter (HOSPITAL_COMMUNITY)
Admission: RE | Admit: 2016-04-28 | Discharge: 2016-04-28 | Disposition: A | Discharge status: Home / Self Care | Payer: Self-pay | Source: Ambulatory Visit | Attending: Cardiology | Admitting: Cardiology

## 2016-04-30 ENCOUNTER — Encounter (HOSPITAL_COMMUNITY): Payer: Self-pay

## 2016-05-02 ENCOUNTER — Encounter (HOSPITAL_COMMUNITY): Payer: Self-pay

## 2016-05-05 ENCOUNTER — Encounter (HOSPITAL_COMMUNITY)
Admission: RE | Admit: 2016-05-05 | Discharge: 2016-05-05 | Disposition: A | Discharge status: Home / Self Care | Payer: Self-pay | Source: Ambulatory Visit | Attending: Cardiology | Admitting: Cardiology

## 2016-05-07 ENCOUNTER — Encounter (HOSPITAL_COMMUNITY): Payer: Self-pay

## 2016-05-09 ENCOUNTER — Encounter (HOSPITAL_COMMUNITY)
Admission: RE | Admit: 2016-05-09 | Discharge: 2016-05-09 | Disposition: A | Discharge status: Home / Self Care | Payer: Self-pay | Source: Ambulatory Visit | Attending: Cardiology | Admitting: Cardiology

## 2016-05-12 ENCOUNTER — Encounter (HOSPITAL_COMMUNITY): Payer: Self-pay

## 2016-05-14 ENCOUNTER — Encounter (HOSPITAL_COMMUNITY)
Admission: RE | Admit: 2016-05-14 | Discharge: 2016-05-14 | Disposition: A | Discharge status: Home / Self Care | Payer: Self-pay | Source: Ambulatory Visit | Attending: Cardiology | Admitting: Cardiology

## 2016-05-16 ENCOUNTER — Encounter (HOSPITAL_COMMUNITY): Payer: Self-pay

## 2016-05-19 ENCOUNTER — Encounter (HOSPITAL_COMMUNITY)
Admission: RE | Admit: 2016-05-19 | Discharge: 2016-05-19 | Disposition: A | Discharge status: Home / Self Care | Payer: Self-pay | Source: Ambulatory Visit | Attending: Cardiology | Admitting: Cardiology

## 2016-05-21 ENCOUNTER — Encounter (HOSPITAL_COMMUNITY)
Admission: RE | Admit: 2016-05-21 | Discharge: 2016-05-21 | Disposition: A | Discharge status: Home / Self Care | Payer: Self-pay | Source: Ambulatory Visit | Attending: Cardiology | Admitting: Cardiology

## 2016-05-21 DIAGNOSIS — Z955 Presence of coronary angioplasty implant and graft: Secondary | ICD-10-CM | POA: Insufficient documentation

## 2016-05-23 ENCOUNTER — Encounter (HOSPITAL_COMMUNITY)
Admission: RE | Admit: 2016-05-23 | Discharge: 2016-05-23 | Disposition: A | Discharge status: Home / Self Care | Payer: Self-pay | Source: Ambulatory Visit | Attending: Cardiology | Admitting: Cardiology

## 2016-05-26 ENCOUNTER — Encounter (HOSPITAL_COMMUNITY)
Admission: RE | Admit: 2016-05-26 | Discharge: 2016-05-26 | Disposition: A | Discharge status: Home / Self Care | Payer: Self-pay | Source: Ambulatory Visit | Attending: Cardiology | Admitting: Cardiology

## 2016-05-28 ENCOUNTER — Encounter (HOSPITAL_COMMUNITY)
Admission: RE | Admit: 2016-05-28 | Discharge: 2016-05-28 | Disposition: A | Discharge status: Home / Self Care | Payer: Self-pay | Source: Ambulatory Visit | Attending: Cardiology | Admitting: Cardiology

## 2016-05-30 ENCOUNTER — Encounter (HOSPITAL_COMMUNITY)
Admission: RE | Admit: 2016-05-30 | Discharge: 2016-05-30 | Disposition: A | Discharge status: Home / Self Care | Payer: Self-pay | Source: Ambulatory Visit | Attending: Cardiology | Admitting: Cardiology

## 2016-06-02 ENCOUNTER — Encounter (HOSPITAL_COMMUNITY)
Admission: RE | Admit: 2016-06-02 | Discharge: 2016-06-02 | Disposition: A | Discharge status: Home / Self Care | Payer: Self-pay | Source: Ambulatory Visit | Attending: Cardiology | Admitting: Cardiology

## 2016-06-04 ENCOUNTER — Encounter (HOSPITAL_COMMUNITY): Payer: Self-pay

## 2016-06-06 ENCOUNTER — Encounter (HOSPITAL_COMMUNITY)
Admission: RE | Admit: 2016-06-06 | Discharge: 2016-06-06 | Disposition: A | Discharge status: Home / Self Care | Payer: Self-pay | Source: Ambulatory Visit | Attending: Cardiology | Admitting: Cardiology

## 2016-06-09 ENCOUNTER — Encounter (HOSPITAL_COMMUNITY)
Admission: RE | Admit: 2016-06-09 | Discharge: 2016-06-09 | Disposition: A | Discharge status: Home / Self Care | Payer: Self-pay | Source: Ambulatory Visit | Attending: Cardiology | Admitting: Cardiology

## 2016-06-11 ENCOUNTER — Encounter (HOSPITAL_COMMUNITY): Payer: Self-pay

## 2016-06-16 ENCOUNTER — Encounter (HOSPITAL_COMMUNITY): Payer: Self-pay

## 2016-06-18 ENCOUNTER — Encounter (HOSPITAL_COMMUNITY)
Admission: RE | Admit: 2016-06-18 | Discharge: 2016-06-18 | Disposition: A | Discharge status: Home / Self Care | Payer: Self-pay | Source: Ambulatory Visit | Attending: Cardiology | Admitting: Cardiology

## 2016-06-20 ENCOUNTER — Encounter (HOSPITAL_COMMUNITY)
Admission: RE | Admit: 2016-06-20 | Discharge: 2016-06-20 | Disposition: A | Discharge status: Home / Self Care | Payer: Self-pay | Source: Ambulatory Visit | Attending: Cardiology | Admitting: Cardiology

## 2016-06-20 DIAGNOSIS — Z955 Presence of coronary angioplasty implant and graft: Secondary | ICD-10-CM | POA: Insufficient documentation

## 2016-06-23 ENCOUNTER — Encounter (HOSPITAL_COMMUNITY)
Admission: RE | Admit: 2016-06-23 | Discharge: 2016-06-23 | Disposition: A | Discharge status: Home / Self Care | Payer: Self-pay | Source: Ambulatory Visit | Attending: Cardiology | Admitting: Cardiology

## 2016-06-25 ENCOUNTER — Encounter (HOSPITAL_COMMUNITY)
Admission: RE | Admit: 2016-06-25 | Discharge: 2016-06-25 | Disposition: A | Discharge status: Home / Self Care | Payer: Self-pay | Source: Ambulatory Visit | Attending: Cardiology | Admitting: Cardiology

## 2016-06-27 ENCOUNTER — Encounter (HOSPITAL_COMMUNITY): Payer: Self-pay

## 2016-06-30 ENCOUNTER — Encounter (HOSPITAL_COMMUNITY)
Admission: RE | Admit: 2016-06-30 | Discharge: 2016-06-30 | Disposition: A | Discharge status: Home / Self Care | Payer: Self-pay | Source: Ambulatory Visit | Attending: Cardiology | Admitting: Cardiology

## 2016-07-02 ENCOUNTER — Encounter (HOSPITAL_COMMUNITY)
Admission: RE | Admit: 2016-07-02 | Discharge: 2016-07-02 | Disposition: A | Discharge status: Home / Self Care | Payer: Self-pay | Source: Ambulatory Visit | Attending: Cardiology | Admitting: Cardiology

## 2016-07-04 ENCOUNTER — Encounter (HOSPITAL_COMMUNITY)
Admission: RE | Admit: 2016-07-04 | Discharge: 2016-07-04 | Disposition: A | Discharge status: Home / Self Care | Payer: Self-pay | Source: Ambulatory Visit | Attending: Cardiology | Admitting: Cardiology

## 2016-07-07 ENCOUNTER — Encounter (HOSPITAL_COMMUNITY)
Admission: RE | Admit: 2016-07-07 | Discharge: 2016-07-07 | Disposition: A | Discharge status: Home / Self Care | Payer: Self-pay | Source: Ambulatory Visit | Attending: Cardiology | Admitting: Cardiology

## 2016-07-08 ENCOUNTER — Other Ambulatory Visit: Payer: Self-pay | Admitting: Physician Assistant

## 2016-07-08 ENCOUNTER — Other Ambulatory Visit: Payer: Self-pay | Admitting: Family Medicine

## 2016-07-08 NOTE — Telephone Encounter (Signed)
ok 

## 2016-07-08 NOTE — Telephone Encounter (Signed)
Is this okay to refill? 

## 2016-07-08 NOTE — Telephone Encounter (Signed)
°*  STAT* If patient is at the pharmacy, call can be transferred to refill team.   1. Which medications need to be refilled? (please list name of each medication and dose if known)zolpidem 5mg   2. Which pharmacy/location (including street and city if local pharmacy) is medication to be sent to?[place] [address] [place] [facility]   3. Do they need a 30 day or 90 day supply? 90

## 2016-07-08 NOTE — Telephone Encounter (Signed)
Called in ambien per jcl 

## 2016-07-08 NOTE — Telephone Encounter (Signed)
pharmacy called for refill of ambien, informed them they would have to call the PCP of the pt, spoke with justine whom expresed understanding.

## 2016-07-09 ENCOUNTER — Encounter (HOSPITAL_COMMUNITY)
Admission: RE | Admit: 2016-07-09 | Discharge: 2016-07-09 | Disposition: A | Discharge status: Home / Self Care | Payer: Self-pay | Source: Ambulatory Visit | Attending: Cardiology | Admitting: Cardiology

## 2016-07-11 ENCOUNTER — Encounter (HOSPITAL_COMMUNITY): Payer: Self-pay

## 2016-07-16 ENCOUNTER — Encounter (HOSPITAL_COMMUNITY): Payer: Self-pay

## 2016-07-18 ENCOUNTER — Encounter (HOSPITAL_COMMUNITY)
Admission: RE | Admit: 2016-07-18 | Discharge: 2016-07-18 | Disposition: A | Discharge status: Home / Self Care | Payer: Self-pay | Source: Ambulatory Visit | Attending: Cardiology | Admitting: Cardiology

## 2016-07-21 ENCOUNTER — Telehealth: Payer: Self-pay | Admitting: Family Medicine

## 2016-07-21 NOTE — Telephone Encounter (Signed)
P.A. ZOLPIDEM °

## 2016-07-23 ENCOUNTER — Encounter (HOSPITAL_COMMUNITY)
Admission: RE | Admit: 2016-07-23 | Discharge: 2016-07-23 | Disposition: A | Discharge status: Home / Self Care | Payer: Self-pay | Source: Ambulatory Visit | Attending: Cardiology | Admitting: Cardiology

## 2016-07-23 DIAGNOSIS — Z955 Presence of coronary angioplasty implant and graft: Secondary | ICD-10-CM | POA: Insufficient documentation

## 2016-07-25 ENCOUNTER — Encounter (HOSPITAL_COMMUNITY): Payer: Self-pay

## 2016-07-28 ENCOUNTER — Encounter (HOSPITAL_COMMUNITY): Payer: Self-pay

## 2016-07-30 ENCOUNTER — Encounter (HOSPITAL_COMMUNITY): Payer: Self-pay

## 2016-08-01 ENCOUNTER — Encounter (HOSPITAL_COMMUNITY): Payer: Self-pay

## 2016-08-04 ENCOUNTER — Encounter (HOSPITAL_COMMUNITY): Payer: Self-pay

## 2016-08-05 ENCOUNTER — Other Ambulatory Visit: Payer: Self-pay | Admitting: Cardiology

## 2016-08-05 ENCOUNTER — Other Ambulatory Visit: Payer: Self-pay | Admitting: Family Medicine

## 2016-08-05 DIAGNOSIS — N3281 Overactive bladder: Secondary | ICD-10-CM

## 2016-08-05 DIAGNOSIS — N400 Enlarged prostate without lower urinary tract symptoms: Secondary | ICD-10-CM

## 2016-08-05 NOTE — Telephone Encounter (Signed)
Is this okay to refill? 

## 2016-08-05 NOTE — Telephone Encounter (Signed)
Rx(s) sent to pharmacy electronically.  

## 2016-08-06 ENCOUNTER — Encounter (HOSPITAL_COMMUNITY): Payer: Self-pay

## 2016-08-06 NOTE — Telephone Encounter (Signed)
P.A. Gibson Ramp, pt needs tried and failure to Belsomra and [place]. Do you want to switch?

## 2016-08-07 NOTE — Telephone Encounter (Signed)
Let him know that and call in Belsomra 15 mg#30 one daily at bedtime when necessary sleep

## 2016-08-07 NOTE — Telephone Encounter (Signed)
Called in [place] into [place], left message for pt

## 2016-08-08 ENCOUNTER — Encounter (HOSPITAL_COMMUNITY): Payer: Self-pay

## 2016-08-11 ENCOUNTER — Encounter (HOSPITAL_COMMUNITY)
Admission: RE | Admit: 2016-08-11 | Discharge: 2016-08-11 | Disposition: A | Discharge status: Home / Self Care | Payer: Self-pay | Source: Ambulatory Visit | Attending: Cardiology | Admitting: Cardiology

## 2016-08-13 ENCOUNTER — Encounter (HOSPITAL_COMMUNITY)
Admission: RE | Admit: 2016-08-13 | Discharge: 2016-08-13 | Disposition: A | Discharge status: Home / Self Care | Payer: Self-pay | Source: Ambulatory Visit | Attending: Cardiology | Admitting: Cardiology

## 2016-08-15 ENCOUNTER — Encounter (HOSPITAL_COMMUNITY)
Admission: RE | Admit: 2016-08-15 | Discharge: 2016-08-15 | Disposition: A | Discharge status: Home / Self Care | Payer: Self-pay | Source: Ambulatory Visit | Attending: Cardiology | Admitting: Cardiology

## 2016-08-18 ENCOUNTER — Encounter (HOSPITAL_COMMUNITY)
Admission: RE | Admit: 2016-08-18 | Discharge: 2016-08-18 | Disposition: A | Discharge status: Home / Self Care | Payer: Self-pay | Source: Ambulatory Visit | Attending: Cardiology | Admitting: Cardiology

## 2016-08-20 ENCOUNTER — Telehealth: Payer: Self-pay | Admitting: Family Medicine

## 2016-08-20 ENCOUNTER — Encounter (HOSPITAL_COMMUNITY)
Admission: RE | Admit: 2016-08-20 | Discharge: 2016-08-20 | Disposition: A | Discharge status: Home / Self Care | Payer: Self-pay | Source: Ambulatory Visit | Attending: Cardiology | Admitting: Cardiology

## 2016-08-20 NOTE — Telephone Encounter (Signed)
P.A. CIALIS  °

## 2016-08-22 ENCOUNTER — Encounter (HOSPITAL_COMMUNITY)
Admission: RE | Admit: 2016-08-22 | Discharge: 2016-08-22 | Disposition: A | Discharge status: Home / Self Care | Payer: Self-pay | Source: Ambulatory Visit | Attending: Cardiology | Admitting: Cardiology

## 2016-08-22 DIAGNOSIS — Z955 Presence of coronary angioplasty implant and graft: Secondary | ICD-10-CM | POA: Insufficient documentation

## 2016-08-24 NOTE — Telephone Encounter (Signed)
P.A. Approved til 07/20/17, pt informed, faxed pharmacy 

## 2016-08-25 ENCOUNTER — Encounter (HOSPITAL_COMMUNITY): Payer: Self-pay

## 2016-08-27 ENCOUNTER — Encounter (HOSPITAL_COMMUNITY)
Admission: RE | Admit: 2016-08-27 | Discharge: 2016-08-27 | Disposition: A | Discharge status: Home / Self Care | Payer: Self-pay | Source: Ambulatory Visit | Attending: Cardiology | Admitting: Cardiology

## 2016-08-29 ENCOUNTER — Encounter (HOSPITAL_COMMUNITY): Payer: Self-pay

## 2016-09-01 ENCOUNTER — Encounter (HOSPITAL_COMMUNITY)
Admission: RE | Admit: 2016-09-01 | Discharge: 2016-09-01 | Disposition: A | Discharge status: Home / Self Care | Payer: Self-pay | Source: Ambulatory Visit | Attending: Cardiology | Admitting: Cardiology

## 2016-09-03 ENCOUNTER — Encounter (HOSPITAL_COMMUNITY): Payer: Self-pay

## 2016-09-05 ENCOUNTER — Encounter (HOSPITAL_COMMUNITY)
Admission: RE | Admit: 2016-09-05 | Discharge: 2016-09-05 | Disposition: A | Discharge status: Home / Self Care | Payer: Self-pay | Source: Ambulatory Visit | Attending: Cardiology | Admitting: Cardiology

## 2016-09-08 ENCOUNTER — Encounter (HOSPITAL_COMMUNITY)
Admission: RE | Admit: 2016-09-08 | Discharge: 2016-09-08 | Disposition: A | Discharge status: Home / Self Care | Payer: Self-pay | Source: Ambulatory Visit | Attending: Cardiology | Admitting: Cardiology

## 2016-09-10 ENCOUNTER — Encounter (HOSPITAL_COMMUNITY)
Admission: RE | Admit: 2016-09-10 | Discharge: 2016-09-10 | Disposition: A | Discharge status: Home / Self Care | Payer: Self-pay | Source: Ambulatory Visit | Attending: Cardiology | Admitting: Cardiology

## 2016-09-12 ENCOUNTER — Encounter (HOSPITAL_COMMUNITY)
Admission: RE | Admit: 2016-09-12 | Discharge: 2016-09-12 | Disposition: A | Discharge status: Home / Self Care | Payer: Self-pay | Source: Ambulatory Visit | Attending: Cardiology | Admitting: Cardiology

## 2016-09-15 ENCOUNTER — Encounter (HOSPITAL_COMMUNITY)
Admission: RE | Admit: 2016-09-15 | Discharge: 2016-09-15 | Disposition: A | Discharge status: Home / Self Care | Payer: Self-pay | Source: Ambulatory Visit | Attending: Cardiology | Admitting: Cardiology

## 2016-09-17 ENCOUNTER — Encounter (HOSPITAL_COMMUNITY)
Admission: RE | Admit: 2016-09-17 | Discharge: 2016-09-17 | Disposition: A | Discharge status: Home / Self Care | Payer: Self-pay | Source: Ambulatory Visit | Attending: Cardiology | Admitting: Cardiology

## 2016-09-19 ENCOUNTER — Encounter (HOSPITAL_COMMUNITY)
Admission: RE | Admit: 2016-09-19 | Discharge: 2016-09-19 | Disposition: A | Discharge status: Home / Self Care | Payer: Self-pay | Source: Ambulatory Visit | Attending: Cardiology | Admitting: Cardiology

## 2016-09-19 DIAGNOSIS — Z955 Presence of coronary angioplasty implant and graft: Secondary | ICD-10-CM | POA: Insufficient documentation

## 2016-09-22 ENCOUNTER — Encounter (HOSPITAL_COMMUNITY)
Admission: RE | Admit: 2016-09-22 | Discharge: 2016-09-22 | Disposition: A | Discharge status: Home / Self Care | Payer: Self-pay | Source: Ambulatory Visit | Attending: Cardiology | Admitting: Cardiology

## 2016-09-24 ENCOUNTER — Encounter (HOSPITAL_COMMUNITY): Payer: Self-pay

## 2016-09-26 ENCOUNTER — Encounter (HOSPITAL_COMMUNITY): Payer: Self-pay

## 2016-09-29 ENCOUNTER — Encounter (HOSPITAL_COMMUNITY)
Admission: RE | Admit: 2016-09-29 | Discharge: 2016-09-29 | Disposition: A | Discharge status: Home / Self Care | Payer: Self-pay | Source: Ambulatory Visit | Attending: Cardiology | Admitting: Cardiology

## 2016-09-30 NOTE — Progress Notes (Signed)
Henry Rogers Date of Birth: 1950-11-01 Medical Record #[id number]  History of Present Illness: [name] is seen for follow up CAD. He is s/p inferior STEMI on 02/02/14. The Mid RCA was occluded with large thrombus load. He had cardiogenic shock. The RCA was stented with a 5.0x16 mm Veriflex stent. EF was 45-50%. Other vessels had no significant disease. Repeat Echo in September 2015 showed that his LV function had normalized. EF 55-60%.  He was readmitted on May 06, 2015 with recurrent inferior infarct again associated with cardiogenic shock. The stent in the mid RCA was patent but the distal RCA was occluded with heavy thrombus. This was treated with POBA only with a 5.0 mm balloon.  Since then he has done very well.  No chest pain or SOB. He is doing interval training at [facility] three days a week. His BP has been well controlled. He is tolerating his medication well. He has gained about 9 lbs. He is experiencing severe acid reflux. Has known hiatal hernia. Is considering surgical procedure for relief but would need EGD first.   Allergies as of 10/02/2016      Reactions   Ace Inhibitors    No personal hx reaction, but 2 family members have had persistent cough with the class and he prefers not to take ACEi.      Medication List       Accurate as of 10/02/16 12:14 PM. Always use your most recent med list.          aspirin 81 MG tablet Take 81 mg by mouth daily.   atorvastatin 80 MG tablet Commonly known as:  LIPITOR TAKE 1 TABLET(80 MG) BY MOUTH DAILY 6 PM   CIALIS 5 MG tablet Generic drug:  tadalafil Take 1 tablet by mouth at bedtime.   CIALIS 5 MG tablet Generic drug:  tadalafil TAKE 1 TABLET(5 MG) BY MOUTH DAILY AS NEEDED FOR ERECTILE DYSFUNCTION   JUICE PLUS FIBRE PO Take 1 tablet by mouth 2 (two) times daily. Fruit blend and vegetable blend   Melatonin 10 MG Caps Take 10 mg by mouth at bedtime. Takes daily   metoprolol tartrate 25 MG  tablet Commonly known as:  LOPRESSOR Take 1 tablet (25 mg total) by mouth 2 (two) times daily.   multivitamin tablet Take 1 tablet by mouth every evening.   nitroGLYCERIN 0.4 MG SL tablet Commonly known as:  NITROSTAT Place 1 tablet (0.4 mg total) under the tongue every 5 (five) minutes as needed for chest pain. MAX 3 doses   omeprazole 40 MG capsule Commonly known as:  PRILOSEC TAKE 1 CAPSULE BY MOUTH DAILY   ranitidine 75 MG tablet Commonly known as:  ZANTAC Take 75 mg by mouth at bedtime.   ticagrelor 60 MG Tabs tablet Commonly known as:  BRILINTA Take 1 tablet (60 mg total) by mouth 2 (two) times daily.   zolpidem 5 MG tablet Commonly known as:  AMBIEN TAKE 1 TABLET BY MOUTH AT BEDTIME AS NEEDED FOR SLEEP       Allergies  Allergen Reactions  . Ace Inhibitors     No personal hx reaction, but 2 family members have had persistent cough with the class and he prefers not to take ACEi.    Past Medical History:  Diagnosis Date  . CAD (coronary artery disease)    a. inf STEMI s/p BMS to mRCA (02/03/14)  b. inf STEMI s/p PCTA to RCA with slow reflow.  . Chronic diastolic CHF (  congestive heart failure) (HCC)    a. 2D echo on 05/18/15 LVEF 55-60%, akinesis basal inferior wall, G2DD   . GERD (gastroesophageal reflux disease)   . Hearing loss   . Hemorrhoids   . HH (hiatus hernia)   . Ischemic cardiomyopathy    a. ECHO 01/2014 EF 45-50% and akinesis of the entire inferior myocardium. Mild MR.   b. now resolved (EF 55-60% on 04/22/15)    . Metabolic syndrome   . OSA (obstructive sleep apnea)    a. mild not requiring CPAP at this time  . PAF (paroxysmal atrial fibrillation) (HCC)    a. brief episode after cardiac cath on 05/16/15- resolved after IV amiodarone.  . Tinnitus   . Varicose veins     Past Surgical History:  Procedure Laterality Date  . CARDIAC CATHETERIZATION N/A 05/16/2015   Procedure: Left Heart Cath and Coronary Angiography;  Surgeon: Lyn Records, MD;   Location: [facility] INVASIVE CV LAB;  Service: Cardiovascular;  Laterality: N/A;  . CARDIAC CATHETERIZATION N/A 05/16/2015   Procedure: Coronary Stent Intervention;  Surgeon: Lyn Records, MD;  Location: [facility] INVASIVE CV LAB;  Service: Cardiovascular;  Laterality: N/A;  . CARPAL TUNNEL RELEASE Bilateral 03/02/2015   Procedure: BILATERAL CARPAL TUNNEL RELEASE ;  Surgeon: Cindee Salt, MD;  Location: [facility];  Service: Orthopedics;  Laterality: Bilateral;  . COLONOSCOPY  2006   Dr. Elnoria Howard  . CORONARY ANGIOPLASTY WITH STENT PLACEMENT  02/02/14   BMS to RCA  . ENDOVENOUS ABLATION SAPHENOUS VEIN W/ LASER Right 10-12-2014   EVLA right greater saphenous vein by Gretta Began MD  . LEFT HEART CATHETERIZATION WITH CORONARY ANGIOGRAM N/A 02/02/2014   Procedure: LEFT HEART CATHETERIZATION WITH CORONARY ANGIOGRAM;  Surgeon: Ramez Arrona M Swaziland, MD;  Location: [facility] CATH LAB;  Service: Cardiovascular;  Laterality: N/A;  . stab phlebectomy  Right 03-15-2015   stab phlebectomy > 20 incisions (right leg) by Gretta Began MD    Social History   Social History  . Marital status: Married    Spouse name: N/A  . Number of children: N/A  . Years of education: N/A   Social History Main Topics  . Smoking status: Former Games developer  . Smokeless tobacco: Never Used  . Alcohol use 1.8 oz/week    3 Glasses of wine per week  . Drug use: No  . Sexual activity: Not Currently   Other Topics Concern  . None   Social History Narrative  . None    Family History  Problem Relation Age of Onset  . Hypertension Mother   . Cancer Maternal Grandmother     Review of Systems: As noted in HPI.  All other systems were reviewed and are negative.  Physical Exam: BP 132/84   Pulse 63   Ht 5\' 6"  (1.676 m)   Wt 195 lb (88.5 kg)   BMI 31.47 kg/m  Filed Weights   10/02/16 1151  Weight: 195 lb (88.5 kg)  GENERAL:  Well appearing WM in NAD HEENT:  PERRL, EOMI, sclera are clear. Oropharynx is clear. NECK:  No jugular venous  distention, carotid upstroke brisk and symmetric, no bruits LUNGS:  Clear to auscultation bilaterally CHEST:  Unremarkable HEART:  RRR,  PMI not displaced or sustained,S1 and S2 within normal limits, no S3, no S4: no clicks, no rubs, no murmurs ABD:  Soft, nontender. BS +, no masses or bruits. No hepatomegaly, no splenomegaly EXT:  2 + pulses throughout, no edema, + varicose veins SKIN:  Warm  and dry.  No rashes NEURO:  Alert and oriented x 3. Cranial nerves II through XII intact. PSYCH:  Cognitively intact    LABORATORY DATA: Lab Results  Component Value Date   WBC 5.7 05/21/2015   HGB 12.8 (L) 05/21/2015   HCT 36.8 (L) 05/21/2015   PLT 196 05/21/2015   GLUCOSE 110 (H) 12/04/2015   CHOL 119 (L) 12/04/2015   TRIG 78 12/04/2015   HDL 46 12/04/2015   LDLCALC 57 12/04/2015   ALT 33 12/04/2015   AST 28 12/04/2015   NA 138 12/04/2015   K 4.7 12/04/2015   CL 103 12/04/2015   CREATININE 1.12 12/04/2015   BUN 19 12/04/2015   CO2 29 12/04/2015   TSH 0.973 02/02/2014   PSA 0.90 01/03/2015   INR 1.03 02/02/2014   HGBA1C 5.8 (H) 01/03/2015     Assessment / Plan: 1. CAD s/p inferior STEMI 02/02/14 with cardiogenic shock. S/p BMS of mid RCA. 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 Large vessel with a lot of thrombus. He is asymptomatic.   Given recurrent infarction I would recommend DAPT indefinitely but will continue lower dose of Brilinta to 60 mg bid.  Continue statin and metoprolol.  Follow up in 6 months.  2. Ischemic cardiomyopathy with EF 45-50%. Repeat Echo showed normal LV function in October 2016. ACEi stopped before due to hypotension. Continue low dose metoprolol.  3. Hyperlipidemia.   Continue high dose statin. Continue dietary modification. Lipids look excellent last May. Has yearly labs followed by Dr. Susann Givens.   4. GERD- he is clear to proceed with evaluation for potential invasive therapies. He could come off Brilinta for 5 days if needed  for EGD or surgical procedures.

## 2016-10-01 ENCOUNTER — Encounter (HOSPITAL_COMMUNITY)
Admission: RE | Admit: 2016-10-01 | Discharge: 2016-10-01 | Disposition: A | Discharge status: Home / Self Care | Payer: Self-pay | Source: Ambulatory Visit | Attending: Cardiology | Admitting: Cardiology

## 2016-10-02 ENCOUNTER — Ambulatory Visit (INDEPENDENT_AMBULATORY_CARE_PROVIDER_SITE_OTHER): Payer: Medicare Other | Admitting: Cardiology

## 2016-10-02 ENCOUNTER — Encounter: Payer: Self-pay | Admitting: Cardiology

## 2016-10-02 VITALS — BP 132/84 | HR 63 | Ht 66.0 in | Wt 195.0 lb

## 2016-10-02 DIAGNOSIS — I252 Old myocardial infarction: Secondary | ICD-10-CM | POA: Diagnosis not present

## 2016-10-02 DIAGNOSIS — E785 Hyperlipidemia, unspecified: Secondary | ICD-10-CM | POA: Diagnosis not present

## 2016-10-02 DIAGNOSIS — I2510 Atherosclerotic heart disease of native coronary artery without angina pectoris: Secondary | ICD-10-CM

## 2016-10-02 NOTE — Patient Instructions (Signed)
Continue your current therapy  I will see you in 6 months.   

## 2016-10-03 ENCOUNTER — Encounter (HOSPITAL_COMMUNITY): Payer: Self-pay

## 2016-10-06 ENCOUNTER — Encounter (HOSPITAL_COMMUNITY)
Admission: RE | Admit: 2016-10-06 | Discharge: 2016-10-06 | Disposition: A | Discharge status: Home / Self Care | Payer: Self-pay | Source: Ambulatory Visit | Attending: Cardiology | Admitting: Cardiology

## 2016-10-08 ENCOUNTER — Encounter (HOSPITAL_COMMUNITY): Payer: Self-pay

## 2016-10-10 ENCOUNTER — Encounter (HOSPITAL_COMMUNITY): Payer: Self-pay

## 2016-10-13 ENCOUNTER — Encounter (HOSPITAL_COMMUNITY): Payer: Self-pay

## 2016-10-20 ENCOUNTER — Encounter (HOSPITAL_COMMUNITY)
Admission: RE | Admit: 2016-10-20 | Discharge: 2016-10-20 | Disposition: A | Discharge status: Home / Self Care | Payer: Self-pay | Source: Ambulatory Visit | Attending: Cardiology | Admitting: Cardiology

## 2016-10-20 DIAGNOSIS — I2510 Atherosclerotic heart disease of native coronary artery without angina pectoris: Secondary | ICD-10-CM | POA: Insufficient documentation

## 2016-10-22 ENCOUNTER — Encounter (HOSPITAL_COMMUNITY)
Admission: RE | Admit: 2016-10-22 | Discharge: 2016-10-22 | Disposition: A | Discharge status: Home / Self Care | Payer: Self-pay | Source: Ambulatory Visit | Attending: Cardiology | Admitting: Cardiology

## 2016-10-24 ENCOUNTER — Encounter (HOSPITAL_COMMUNITY)
Admission: RE | Admit: 2016-10-24 | Discharge: 2016-10-24 | Disposition: A | Discharge status: Home / Self Care | Payer: Self-pay | Source: Ambulatory Visit | Attending: Cardiology | Admitting: Cardiology

## 2016-10-27 ENCOUNTER — Encounter (HOSPITAL_COMMUNITY)
Admission: RE | Admit: 2016-10-27 | Discharge: 2016-10-27 | Disposition: A | Discharge status: Home / Self Care | Payer: Self-pay | Source: Ambulatory Visit | Attending: Cardiology | Admitting: Cardiology

## 2016-10-29 ENCOUNTER — Other Ambulatory Visit: Payer: Self-pay | Admitting: Family Medicine

## 2016-10-29 ENCOUNTER — Encounter (HOSPITAL_COMMUNITY): Payer: Self-pay

## 2016-10-29 DIAGNOSIS — N400 Enlarged prostate without lower urinary tract symptoms: Secondary | ICD-10-CM

## 2016-10-29 DIAGNOSIS — N3281 Overactive bladder: Secondary | ICD-10-CM

## 2016-10-29 NOTE — Telephone Encounter (Signed)
Is this okay to refill? 

## 2016-10-31 ENCOUNTER — Encounter (HOSPITAL_COMMUNITY)
Admission: RE | Admit: 2016-10-31 | Discharge: 2016-10-31 | Disposition: A | Discharge status: Home / Self Care | Payer: Self-pay | Source: Ambulatory Visit | Attending: Cardiology | Admitting: Cardiology

## 2016-11-03 ENCOUNTER — Encounter (HOSPITAL_COMMUNITY)
Admission: RE | Admit: 2016-11-03 | Discharge: 2016-11-03 | Disposition: A | Discharge status: Home / Self Care | Payer: Self-pay | Source: Ambulatory Visit | Attending: Cardiology | Admitting: Cardiology

## 2016-11-05 ENCOUNTER — Encounter (HOSPITAL_COMMUNITY)
Admission: RE | Admit: 2016-11-05 | Discharge: 2016-11-05 | Disposition: A | Discharge status: Home / Self Care | Payer: Self-pay | Source: Ambulatory Visit | Attending: Cardiology | Admitting: Cardiology

## 2016-11-07 ENCOUNTER — Encounter (HOSPITAL_COMMUNITY)
Admission: RE | Admit: 2016-11-07 | Discharge: 2016-11-07 | Disposition: A | Discharge status: Home / Self Care | Payer: Self-pay | Source: Ambulatory Visit | Attending: Cardiology | Admitting: Cardiology

## 2016-11-10 ENCOUNTER — Encounter (HOSPITAL_COMMUNITY)
Admission: RE | Admit: 2016-11-10 | Discharge: 2016-11-10 | Disposition: A | Discharge status: Home / Self Care | Payer: Self-pay | Source: Ambulatory Visit | Attending: Cardiology | Admitting: Cardiology

## 2016-11-12 ENCOUNTER — Encounter (HOSPITAL_COMMUNITY): Payer: Self-pay

## 2016-11-14 ENCOUNTER — Encounter (HOSPITAL_COMMUNITY): Payer: Self-pay

## 2016-11-17 ENCOUNTER — Encounter (HOSPITAL_COMMUNITY)
Admission: RE | Admit: 2016-11-17 | Discharge: 2016-11-17 | Disposition: A | Discharge status: Home / Self Care | Payer: Self-pay | Source: Ambulatory Visit | Attending: Cardiology | Admitting: Cardiology

## 2016-11-19 ENCOUNTER — Encounter (HOSPITAL_COMMUNITY)
Admission: RE | Admit: 2016-11-19 | Discharge: 2016-11-19 | Disposition: A | Discharge status: Home / Self Care | Payer: Self-pay | Source: Ambulatory Visit | Attending: Cardiology | Admitting: Cardiology

## 2016-11-19 DIAGNOSIS — I2510 Atherosclerotic heart disease of native coronary artery without angina pectoris: Secondary | ICD-10-CM | POA: Insufficient documentation

## 2016-11-21 ENCOUNTER — Encounter (HOSPITAL_COMMUNITY)
Admission: RE | Admit: 2016-11-21 | Discharge: 2016-11-21 | Disposition: A | Discharge status: Home / Self Care | Payer: Self-pay | Source: Ambulatory Visit | Attending: Cardiology | Admitting: Cardiology

## 2016-11-24 ENCOUNTER — Encounter (HOSPITAL_COMMUNITY)
Admission: RE | Admit: 2016-11-24 | Discharge: 2016-11-24 | Disposition: A | Discharge status: Home / Self Care | Payer: Self-pay | Source: Ambulatory Visit | Attending: Cardiology | Admitting: Cardiology

## 2016-11-26 ENCOUNTER — Encounter (HOSPITAL_COMMUNITY)
Admission: RE | Admit: 2016-11-26 | Discharge: 2016-11-26 | Disposition: A | Discharge status: Home / Self Care | Payer: Self-pay | Source: Ambulatory Visit | Attending: Cardiology | Admitting: Cardiology

## 2016-11-28 ENCOUNTER — Encounter (HOSPITAL_COMMUNITY): Payer: Self-pay

## 2016-12-01 ENCOUNTER — Encounter (HOSPITAL_COMMUNITY)
Admission: RE | Admit: 2016-12-01 | Discharge: 2016-12-01 | Disposition: A | Discharge status: Home / Self Care | Payer: Self-pay | Source: Ambulatory Visit | Attending: Cardiology | Admitting: Cardiology

## 2016-12-03 ENCOUNTER — Encounter (HOSPITAL_COMMUNITY)
Admission: RE | Admit: 2016-12-03 | Discharge: 2016-12-03 | Disposition: A | Discharge status: Home / Self Care | Payer: Self-pay | Source: Ambulatory Visit | Attending: Cardiology | Admitting: Cardiology

## 2016-12-05 ENCOUNTER — Encounter (HOSPITAL_COMMUNITY)
Admission: RE | Admit: 2016-12-05 | Discharge: 2016-12-05 | Disposition: A | Discharge status: Home / Self Care | Payer: Self-pay | Source: Ambulatory Visit | Attending: Cardiology | Admitting: Cardiology

## 2016-12-08 ENCOUNTER — Encounter (HOSPITAL_COMMUNITY)
Admission: RE | Admit: 2016-12-08 | Discharge: 2016-12-08 | Disposition: A | Discharge status: Home / Self Care | Payer: Self-pay | Source: Ambulatory Visit | Attending: Cardiology | Admitting: Cardiology

## 2016-12-10 ENCOUNTER — Encounter (HOSPITAL_COMMUNITY)
Admission: RE | Admit: 2016-12-10 | Discharge: 2016-12-10 | Disposition: A | Discharge status: Home / Self Care | Payer: Self-pay | Source: Ambulatory Visit | Attending: Cardiology | Admitting: Cardiology

## 2016-12-12 ENCOUNTER — Encounter (HOSPITAL_COMMUNITY)
Admission: RE | Admit: 2016-12-12 | Discharge: 2016-12-12 | Disposition: A | Discharge status: Home / Self Care | Payer: Self-pay | Source: Ambulatory Visit | Attending: Cardiology | Admitting: Cardiology

## 2016-12-17 ENCOUNTER — Encounter (HOSPITAL_COMMUNITY)
Admission: RE | Admit: 2016-12-17 | Discharge: 2016-12-17 | Disposition: A | Discharge status: Home / Self Care | Payer: Self-pay | Source: Ambulatory Visit | Attending: Cardiology | Admitting: Cardiology

## 2016-12-19 ENCOUNTER — Encounter (HOSPITAL_COMMUNITY)
Admission: RE | Admit: 2016-12-19 | Discharge: 2016-12-19 | Disposition: A | Discharge status: Home / Self Care | Payer: Self-pay | Source: Ambulatory Visit | Attending: Cardiology | Admitting: Cardiology

## 2016-12-19 DIAGNOSIS — I2510 Atherosclerotic heart disease of native coronary artery without angina pectoris: Secondary | ICD-10-CM | POA: Insufficient documentation

## 2016-12-22 ENCOUNTER — Encounter (HOSPITAL_COMMUNITY)
Admission: RE | Admit: 2016-12-22 | Discharge: 2016-12-22 | Disposition: A | Discharge status: Home / Self Care | Payer: Self-pay | Source: Ambulatory Visit | Attending: Cardiology | Admitting: Cardiology

## 2016-12-24 ENCOUNTER — Encounter (HOSPITAL_COMMUNITY)
Admission: RE | Admit: 2016-12-24 | Discharge: 2016-12-24 | Disposition: A | Discharge status: Home / Self Care | Payer: Self-pay | Source: Ambulatory Visit | Attending: Cardiology | Admitting: Cardiology

## 2016-12-26 ENCOUNTER — Encounter (HOSPITAL_COMMUNITY)
Admission: RE | Admit: 2016-12-26 | Discharge: 2016-12-26 | Disposition: A | Discharge status: Home / Self Care | Payer: Self-pay | Source: Ambulatory Visit | Attending: Cardiology | Admitting: Cardiology

## 2016-12-29 ENCOUNTER — Encounter (HOSPITAL_COMMUNITY): Payer: Self-pay

## 2016-12-31 ENCOUNTER — Encounter (HOSPITAL_COMMUNITY)
Admission: RE | Admit: 2016-12-31 | Discharge: 2016-12-31 | Disposition: A | Discharge status: Home / Self Care | Payer: Self-pay | Source: Ambulatory Visit | Attending: Cardiology | Admitting: Cardiology

## 2017-01-02 ENCOUNTER — Encounter (HOSPITAL_COMMUNITY): Payer: Self-pay

## 2017-01-05 ENCOUNTER — Encounter (HOSPITAL_COMMUNITY)
Admission: RE | Admit: 2017-01-05 | Discharge: 2017-01-05 | Disposition: A | Discharge status: Home / Self Care | Payer: Self-pay | Source: Ambulatory Visit | Attending: Cardiology | Admitting: Cardiology

## 2017-01-07 ENCOUNTER — Encounter (HOSPITAL_COMMUNITY): Payer: Self-pay

## 2017-01-09 ENCOUNTER — Encounter (HOSPITAL_COMMUNITY): Payer: Self-pay

## 2017-01-12 ENCOUNTER — Encounter (HOSPITAL_COMMUNITY)
Admission: RE | Admit: 2017-01-12 | Discharge: 2017-01-12 | Disposition: A | Discharge status: Home / Self Care | Payer: Self-pay | Source: Ambulatory Visit | Attending: Cardiology | Admitting: Cardiology

## 2017-01-14 ENCOUNTER — Encounter (HOSPITAL_COMMUNITY)
Admission: RE | Admit: 2017-01-14 | Discharge: 2017-01-14 | Disposition: A | Discharge status: Home / Self Care | Payer: Self-pay | Source: Ambulatory Visit | Attending: Cardiology | Admitting: Cardiology

## 2017-01-15 ENCOUNTER — Other Ambulatory Visit: Payer: Self-pay | Admitting: *Deleted

## 2017-01-16 ENCOUNTER — Encounter (HOSPITAL_COMMUNITY)
Admission: RE | Admit: 2017-01-16 | Discharge: 2017-01-16 | Disposition: A | Discharge status: Home / Self Care | Payer: Self-pay | Source: Ambulatory Visit | Attending: Cardiology | Admitting: Cardiology

## 2017-01-16 MED ORDER — METOPROLOL TARTRATE 25 MG PO TABS: 25.0000 mg | ORAL_TABLET | Freq: Two times a day (BID) | ORAL | 8 refills | Status: DC

## 2017-01-16 NOTE — Telephone Encounter (Signed)
Rx(s) sent to pharmacy electronically.  

## 2017-01-19 ENCOUNTER — Encounter (HOSPITAL_COMMUNITY)
Admission: RE | Admit: 2017-01-19 | Discharge: 2017-01-19 | Disposition: A | Discharge status: Home / Self Care | Payer: Self-pay | Source: Ambulatory Visit | Attending: Cardiology | Admitting: Cardiology

## 2017-01-19 DIAGNOSIS — I2510 Atherosclerotic heart disease of native coronary artery without angina pectoris: Secondary | ICD-10-CM | POA: Insufficient documentation

## 2017-01-23 ENCOUNTER — Encounter (HOSPITAL_COMMUNITY)
Admission: RE | Admit: 2017-01-23 | Discharge: 2017-01-23 | Disposition: A | Discharge status: Home / Self Care | Payer: Self-pay | Source: Ambulatory Visit | Attending: Cardiology | Admitting: Cardiology

## 2017-01-26 ENCOUNTER — Encounter (HOSPITAL_COMMUNITY)
Admission: RE | Admit: 2017-01-26 | Discharge: 2017-01-26 | Disposition: A | Discharge status: Home / Self Care | Payer: Self-pay | Source: Ambulatory Visit | Attending: Cardiology | Admitting: Cardiology

## 2017-01-27 ENCOUNTER — Other Ambulatory Visit: Payer: Self-pay | Admitting: Family Medicine

## 2017-01-28 ENCOUNTER — Encounter (HOSPITAL_COMMUNITY)
Admission: RE | Admit: 2017-01-28 | Discharge: 2017-01-28 | Disposition: A | Discharge status: Home / Self Care | Payer: Self-pay | Source: Ambulatory Visit | Attending: Cardiology | Admitting: Cardiology

## 2017-01-30 ENCOUNTER — Encounter (HOSPITAL_COMMUNITY): Payer: Self-pay

## 2017-02-02 ENCOUNTER — Encounter (HOSPITAL_COMMUNITY): Payer: Self-pay

## 2017-02-04 ENCOUNTER — Encounter (HOSPITAL_COMMUNITY): Payer: Self-pay

## 2017-02-06 ENCOUNTER — Encounter (HOSPITAL_COMMUNITY): Payer: Self-pay

## 2017-02-09 ENCOUNTER — Encounter (HOSPITAL_COMMUNITY): Payer: Self-pay

## 2017-02-11 ENCOUNTER — Encounter (HOSPITAL_COMMUNITY)
Admission: RE | Admit: 2017-02-11 | Discharge: 2017-02-11 | Disposition: A | Discharge status: Home / Self Care | Payer: Self-pay | Source: Ambulatory Visit | Attending: Cardiology | Admitting: Cardiology

## 2017-02-13 ENCOUNTER — Encounter (HOSPITAL_COMMUNITY)
Admission: RE | Admit: 2017-02-13 | Discharge: 2017-02-13 | Disposition: A | Discharge status: Home / Self Care | Payer: Self-pay | Source: Ambulatory Visit | Attending: Cardiology | Admitting: Cardiology

## 2017-02-16 ENCOUNTER — Encounter (HOSPITAL_COMMUNITY)
Admission: RE | Admit: 2017-02-16 | Discharge: 2017-02-16 | Disposition: A | Discharge status: Home / Self Care | Payer: Self-pay | Source: Ambulatory Visit | Attending: Cardiology | Admitting: Cardiology

## 2017-02-18 ENCOUNTER — Encounter (HOSPITAL_COMMUNITY): Payer: Self-pay

## 2017-02-18 ENCOUNTER — Other Ambulatory Visit: Payer: Self-pay | Admitting: Family Medicine

## 2017-02-18 DIAGNOSIS — I2510 Atherosclerotic heart disease of native coronary artery without angina pectoris: Secondary | ICD-10-CM | POA: Insufficient documentation

## 2017-02-19 ENCOUNTER — Ambulatory Visit (INDEPENDENT_AMBULATORY_CARE_PROVIDER_SITE_OTHER): Payer: Medicare Other | Admitting: Family Medicine

## 2017-02-19 ENCOUNTER — Encounter: Payer: Self-pay | Admitting: Family Medicine

## 2017-02-19 VITALS — BP 110/70 | HR 68 | Ht 66.0 in | Wt 180.0 lb

## 2017-02-19 DIAGNOSIS — G4733 Obstructive sleep apnea (adult) (pediatric): Secondary | ICD-10-CM

## 2017-02-19 DIAGNOSIS — Z87891 Personal history of nicotine dependence: Secondary | ICD-10-CM

## 2017-02-19 DIAGNOSIS — K219 Gastro-esophageal reflux disease without esophagitis: Secondary | ICD-10-CM

## 2017-02-19 DIAGNOSIS — N400 Enlarged prostate without lower urinary tract symptoms: Secondary | ICD-10-CM

## 2017-02-19 DIAGNOSIS — H9193 Unspecified hearing loss, bilateral: Secondary | ICD-10-CM | POA: Diagnosis not present

## 2017-02-19 DIAGNOSIS — Z0000 Encounter for general adult medical examination without abnormal findings: Secondary | ICD-10-CM

## 2017-02-19 DIAGNOSIS — N3281 Overactive bladder: Secondary | ICD-10-CM

## 2017-02-19 DIAGNOSIS — E785 Hyperlipidemia, unspecified: Secondary | ICD-10-CM | POA: Diagnosis not present

## 2017-02-19 DIAGNOSIS — Z125 Encounter for screening for malignant neoplasm of prostate: Secondary | ICD-10-CM

## 2017-02-19 DIAGNOSIS — I2510 Atherosclerotic heart disease of native coronary artery without angina pectoris: Secondary | ICD-10-CM | POA: Diagnosis not present

## 2017-02-19 LAB — CBC WITH DIFFERENTIAL/PLATELET
Basophils Absolute: 0 cells/uL (ref 0–200)
Basophils Relative: 0 %
Eosinophils Absolute: 156 cells/uL (ref 15–500)
Eosinophils Relative: 3 %
HCT: 46.3 % (ref 38.5–50.0)
Hemoglobin: 15.5 g/dL (ref 13.2–17.1)
Lymphocytes Relative: 33 %
Lymphs Abs: 1716 cells/uL (ref 850–3900)
MCH: 32.1 pg (ref 27.0–33.0)
MCHC: 33.5 g/dL (ref 32.0–36.0)
MCV: 95.9 fL (ref 80.0–100.0)
MPV: 10.4 fL (ref 7.5–12.5)
Monocytes Absolute: 416 cells/uL (ref 200–950)
Monocytes Relative: 8 %
Neutro Abs: 2912 cells/uL (ref 1500–7800)
Neutrophils Relative %: 56 %
Platelets: 214 10*3/uL (ref 140–400)
RBC: 4.83 MIL/uL (ref 4.20–5.80)
RDW: 13.9 % (ref 11.0–15.0)
WBC: 5.2 10*3/uL (ref 4.0–10.5)

## 2017-02-19 LAB — POCT URINALYSIS DIP (PROADVANTAGE DEVICE)
Bilirubin, UA: NEGATIVE
Blood, UA: NEGATIVE
Glucose, UA: NEGATIVE mg/dL
Ketones, POC UA: NEGATIVE mg/dL
Leukocytes, UA: NEGATIVE
Nitrite, UA: NEGATIVE
Protein Ur, POC: NEGATIVE mg/dL
Specific Gravity, Urine: 1.025
Urobilinogen, Ur: NEGATIVE
pH, UA: 6 (ref 5.0–8.0)

## 2017-02-19 LAB — COMPREHENSIVE METABOLIC PANEL
ALT: 23 U/L (ref 9–46)
AST: 21 U/L (ref 10–35)
Albumin: 4.1 g/dL (ref 3.6–5.1)
Alkaline Phosphatase: 67 U/L (ref 40–115)
BUN: 18 mg/dL (ref 7–25)
CO2: 23 mmol/L (ref 20–31)
Calcium: 9.2 mg/dL (ref 8.6–10.3)
Chloride: 103 mmol/L (ref 98–110)
Creat: 1.13 mg/dL (ref 0.70–1.25)
Glucose, Bld: 104 mg/dL — ABNORMAL HIGH (ref 65–99)
Potassium: 4.5 mmol/L (ref 3.5–5.3)
Sodium: 138 mmol/L (ref 135–146)
Total Bilirubin: 0.5 mg/dL (ref 0.2–1.2)
Total Protein: 6.4 g/dL (ref 6.1–8.1)

## 2017-02-19 LAB — LIPID PANEL
Cholesterol: 141 mg/dL (ref <200)
HDL: 49 mg/dL (ref >40)
LDL Cholesterol: 77 mg/dL (ref <100)
Total CHOL/HDL Ratio: 2.9 Ratio (ref <5.0)
Triglycerides: 77 mg/dL (ref <150)
VLDL: 15 mg/dL (ref <30)

## 2017-02-19 MED ORDER — METOPROLOL TARTRATE 25 MG PO TABS: 25.0000 mg | ORAL_TABLET | Freq: Two times a day (BID) | ORAL | 8 refills | Status: DC

## 2017-02-19 MED ORDER — OMEPRAZOLE 40 MG PO CPDR: 40.0000 mg | DELAYED_RELEASE_CAPSULE | Freq: Every day | ORAL | 3 refills | Status: DC

## 2017-02-19 MED ORDER — CIALIS 5 MG PO TABS: 5.0000 mg | ORAL_TABLET | Freq: Every day | ORAL | 3 refills | Status: DC

## 2017-02-19 MED ORDER — ATORVASTATIN CALCIUM 80 MG PO TABS: ORAL_TABLET | ORAL | 3 refills | Status: DC

## 2017-02-19 NOTE — Progress Notes (Signed)
Subjective:   HPI  Henry Rogers is a 66 y.o. male who presents for Chief Complaint  Patient presents with  . Medicare Wellness  . Annual Exam    Medical care team includes: Ronnald Nian, MD here for primary care  Dr.Jordan Cardio. Dr.Groat Dr Christella Hartigan  Preventative care:  Last ophthalmology visit: 1 1/2 years ago he goes every two years Last dental visit:3/18 Last colonoscopy:03/29/15 Last prostate exam:  Last EKG:04/17/16 Last labs:01/03/15  Prior vaccinations:  TD or Tdap:01/16/09 Influenza:04/07/16 Pneumococcal:23: 01/16/09 13:01/03/15 Shingles/Zostavax: 01/16/09  Advanced directive: No. Discussed with him.   Concerns: He has noted bowel habit changes over the last couple of years. He also complains of some itching in the left upper back area. Over that same timeframe he has retired, been going to cardiac rehabilitation, changes eating habits. Explained that this is not unusual. He continues on Cialis to help with his BPH. He had been tried on multiple other medications but without success. Presently he is also taking Prilosec and Zantac for his reflux symptoms. He was seen by GI however further evaluation was not done due to his cardiac condition. He does have underlying OSA and works on sleeping on his side. He is not interested in CPAP. He continues on atorvastatin and is having no aches or pains with that. He is a former smoker and has had a AAA evaluation. He continues to wear his hearing aids with good results. He has had no recent chest pain, shortness of breath, PND or DOE. As mentioned above he continues to go to cardiac rehabilitation. He is taking Brilinta  Reviewed their medical, surgical, family, social, medication, and allergy history and updated chart as appropriate.  Past Medical History:  Diagnosis Date  . CAD (coronary artery disease)    a. inf STEMI s/p BMS to mRCA (02/03/14)  b. inf STEMI s/p PCTA to RCA with slow reflow.  . Chronic diastolic CHF (congestive  heart failure) (HCC)    a. 2D echo on 05/18/15 LVEF 55-60%, akinesis basal inferior wall, G2DD   . GERD (gastroesophageal reflux disease)   . Hearing loss   . Hemorrhoids   . HH (hiatus hernia)   . Ischemic cardiomyopathy    a. ECHO 01/2014 EF 45-50% and akinesis of the entire inferior myocardium. Mild MR.   b. now resolved (EF 55-60% on 04/22/15)    . Metabolic syndrome   . OSA (obstructive sleep apnea)    a. mild not requiring CPAP at this time  . PAF (paroxysmal atrial fibrillation) (HCC)    a. brief episode after cardiac cath on 05/16/15- resolved after IV amiodarone.  . Tinnitus   . Varicose veins     Past Surgical History:  Procedure Laterality Date  . CARDIAC CATHETERIZATION N/A 05/16/2015   Procedure: Left Heart Cath and Coronary Angiography;  Surgeon: Lyn Records, MD;  Location: [facility] INVASIVE CV LAB;  Service: Cardiovascular;  Laterality: N/A;  . CARDIAC CATHETERIZATION N/A 05/16/2015   Procedure: Coronary Stent Intervention;  Surgeon: Lyn Records, MD;  Location: [facility] INVASIVE CV LAB;  Service: Cardiovascular;  Laterality: N/A;  . CARPAL TUNNEL RELEASE Bilateral 03/02/2015   Procedure: BILATERAL CARPAL TUNNEL RELEASE ;  Surgeon: Cindee Salt, MD;  Location: [facility];  Service: Orthopedics;  Laterality: Bilateral;  . COLONOSCOPY  2006   Dr. Elnoria Howard  . CORONARY ANGIOPLASTY WITH STENT PLACEMENT  02/02/14   BMS to RCA  . ENDOVENOUS ABLATION SAPHENOUS VEIN W/ LASER Right 10-12-2014   EVLA  right greater saphenous vein by Gretta Began MD  . LEFT HEART CATHETERIZATION WITH CORONARY ANGIOGRAM N/A 02/02/2014   Procedure: LEFT HEART CATHETERIZATION WITH CORONARY ANGIOGRAM;  Surgeon: Peter M Swaziland, MD;  Location: [facility] CATH LAB;  Service: Cardiovascular;  Laterality: N/A;  . stab phlebectomy  Right 03-15-2015   stab phlebectomy > 20 incisions (right leg) by Gretta Began MD    Social History   Social History  . Marital status: Married    Spouse name: N/A  . Number of children:  N/A  . Years of education: N/A   Occupational History  . Not on file.   Social History Main Topics  . Smoking status: Former Games developer  . Smokeless tobacco: Never Used  . Alcohol use 1.8 oz/week    3 Glasses of wine per week  . Drug use: No  . Sexual activity: Not Currently   Other Topics Concern  . Not on file   Social History Narrative  . No narrative on file    Family History  Problem Relation Age of Onset  . Hypertension Mother   . Cancer Maternal Grandmother      Current Outpatient Prescriptions:  .  aspirin 81 MG tablet, Take 81 mg by mouth daily., Disp: , Rfl:  .  atorvastatin (LIPITOR) 80 MG tablet, TAKE 1 TABLET(80 MG) BY MOUTH DAILY 6 PM, Disp: 90 tablet, Rfl: 0 .  CIALIS 5 MG tablet, Take 1 tablet by mouth at bedtime., Disp: , Rfl: 11 .  Melatonin 10 MG CAPS, Take 10 mg by mouth at bedtime. Takes daily, Disp: , Rfl:  .  metoprolol tartrate (LOPRESSOR) 25 MG tablet, Take 1 tablet (25 mg total) by mouth 2 (two) times daily., Disp: 60 tablet, Rfl: 8 .  Multiple Vitamin (MULTIVITAMIN) tablet, Take 1 tablet by mouth every evening. , Disp: , Rfl:  .  nitroGLYCERIN (NITROSTAT) 0.4 MG SL tablet, Place 1 tablet (0.4 mg total) under the tongue every 5 (five) minutes as needed for chest pain. MAX 3 doses, Disp: 25 tablet, Rfl: 2 .  Nutritional Supplements (JUICE PLUS FIBRE PO), Take 1 tablet by mouth 2 (two) times daily. Fruit blend and vegetable blend, Disp: , Rfl:  .  omeprazole (PRILOSEC) 40 MG capsule, TAKE 1 CAPSULE BY MOUTH DAILY, Disp: 90 capsule, Rfl: 0 .  ranitidine (ZANTAC) 75 MG tablet, Take 75 mg by mouth at bedtime. , Disp: , Rfl:  .  ticagrelor (BRILINTA) 60 MG TABS tablet, Take 1 tablet (60 mg total) by mouth 2 (two) times daily., Disp: 180 tablet, Rfl: 3 .  zolpidem (AMBIEN) 5 MG tablet, TAKE 1 TABLET BY MOUTH AT BEDTIME AS NEEDED FOR SLEEP (Patient not taking: Reported on 02/19/2017), Disp: 30 tablet, Rfl: 0  Allergies  Allergen Reactions  . Ace Inhibitors      No personal hx reaction, but 2 family members have had persistent cough with the class and he prefers not to take ACEi.       Review of Systems Negative except as above Objective:  General appearance: alert, no distress, WD/WN, Caucasian male Skin: Clear with some slight drying in the left upper back area. HEENT: normocephalic, conjunctiva/corneas normal, sclerae anicteric, PERRLA, EOMi, nares patent, no discharge or erythema, pharynx normal Oral cavity: MMM, tongue normal, teeth normal Neck: supple, no lymphadenopathy, no thyromegaly, no masses, normal ROM, no bruits Chest: non tender, normal shape and expansion Heart: RRR, normal S1, S2, no murmurs Lungs: CTA bilaterally, no wheezes, rhonchi, or rales Abdomen: +bs, soft, non  tender, non distended, no masses, no hepatomegaly, no splenomegaly, no bruits Musculoskeletal: upper extremities non tender, no obvious deformity, normal ROM throughout, lower extremities non tender, no obvious deformity, normal ROM throughout Extremities: no edema, no cyanosis, no clubbing Pulses: 2+ symmetric, upper and lower extremities, normal cap refill Neurological: alert, oriented x 3, CN2-12 intact, strength normal upper extremities and lower extremities, sensation normal throughout, DTRs 2+ throughout, no cerebellar signs, gait normal Psychiatric: normal affect, behavior normal, pleasant    Assessment and Plan :    Routine general medical examination at a health care facility - Plan: POCT Urinalysis DIP (Proadvantage Device), CBC with Differential/Platelet, Comprehensive metabolic panel, Lipid panel, PSA  Gastroesophageal reflux disease without esophagitis - Plan: omeprazole (PRILOSEC) 40 MG capsule  Benign prostatic hyperplasia without lower urinary tract symptoms - Plan: CIALIS 5 MG tablet  Bilateral hearing loss, unspecified hearing loss type  Hyperlipidemia LDL goal <70 - Plan: Lipid panel, atorvastatin (LIPITOR) 80 MG tablet  OSA (obstructive  sleep apnea) - Plan: CIALIS 5 MG tablet  Coronary artery disease involving native coronary artery of native heart without angina pectoris - Plan: CBC with Differential/Platelet, Comprehensive metabolic panel, Lipid panel, metoprolol tartrate (LOPRESSOR) 25 MG tablet  OAB (overactive bladder)  Former smoker  Screening for prostate cancer - Plan: PSA In general he is doing quite well. Above diagnoses on the present medication regimen. Explained that his digestion symptoms are not unusual since he has changed his lifestyle around in terms of exercise stress and eating habits. Recommend cortisone for the itching on his back. He will continue on all of his other medications although I did recommend trying to back off on the Prilosec and use it on an as-needed basis and potentially even doing the same thing with his Zantac but keeping his reflux symptoms under control. Will continue to use his hearing aids.  Physical exam - discussed and counseled on healthy lifestyle, diet, exercise, preventative care, vaccinations,

## 2017-02-19 NOTE — Patient Instructions (Addendum)
Try to taper off the Prilosec and use the minimum effective dose  Henry Rogers , Thank you for taking time to come for your Medicare Wellness Visit. I appreciate your ongoing commitment to your health goals. Please review the following plan we discussed and let me know if I can assist you in the future.   These are the goals we discussed: Goals    None      This is a list of the screening recommended for you and due dates:  Health Maintenance  Topic Date Due  . Pneumonia vaccines (2 of 2 - PPSV23) 01/03/2016  . Flu Shot  02/18/2017  . Tetanus Vaccine  01/17/2019  . Colon Cancer Screening  03/28/2025  .  Hepatitis C: One time screening is recommended by Center for Disease Control  (CDC) for  adults born from [birth year] through 1967.   Completed

## 2017-02-20 ENCOUNTER — Encounter (HOSPITAL_COMMUNITY)
Admission: RE | Admit: 2017-02-20 | Discharge: 2017-02-20 | Disposition: A | Discharge status: Home / Self Care | Payer: Self-pay | Source: Ambulatory Visit | Attending: Cardiology | Admitting: Cardiology

## 2017-02-20 LAB — PSA: PSA: 0.4 ng/mL (ref <=4.0)

## 2017-02-23 ENCOUNTER — Encounter (HOSPITAL_COMMUNITY): Payer: Self-pay

## 2017-02-25 ENCOUNTER — Encounter (HOSPITAL_COMMUNITY)
Admission: RE | Admit: 2017-02-25 | Discharge: 2017-02-25 | Disposition: A | Discharge status: Home / Self Care | Payer: Self-pay | Source: Ambulatory Visit | Attending: Cardiology | Admitting: Cardiology

## 2017-02-27 ENCOUNTER — Encounter (HOSPITAL_COMMUNITY)
Admission: RE | Admit: 2017-02-27 | Discharge: 2017-02-27 | Disposition: A | Discharge status: Home / Self Care | Payer: Self-pay | Source: Ambulatory Visit | Attending: Cardiology | Admitting: Cardiology

## 2017-03-02 ENCOUNTER — Encounter (HOSPITAL_COMMUNITY): Payer: Self-pay

## 2017-03-04 ENCOUNTER — Encounter (HOSPITAL_COMMUNITY): Payer: Self-pay

## 2017-03-06 ENCOUNTER — Encounter (HOSPITAL_COMMUNITY)
Admission: RE | Admit: 2017-03-06 | Discharge: 2017-03-06 | Disposition: A | Discharge status: Home / Self Care | Payer: Self-pay | Source: Ambulatory Visit | Attending: Cardiology | Admitting: Cardiology

## 2017-03-09 ENCOUNTER — Encounter (HOSPITAL_COMMUNITY)
Admission: RE | Admit: 2017-03-09 | Discharge: 2017-03-09 | Disposition: A | Discharge status: Home / Self Care | Payer: Self-pay | Source: Ambulatory Visit | Attending: Cardiology | Admitting: Cardiology

## 2017-03-11 ENCOUNTER — Encounter (HOSPITAL_COMMUNITY)
Admission: RE | Admit: 2017-03-11 | Discharge: 2017-03-11 | Disposition: A | Discharge status: Home / Self Care | Payer: Self-pay | Source: Ambulatory Visit | Attending: Cardiology | Admitting: Cardiology

## 2017-03-13 ENCOUNTER — Encounter (HOSPITAL_COMMUNITY)
Admission: RE | Admit: 2017-03-13 | Discharge: 2017-03-13 | Disposition: A | Discharge status: Home / Self Care | Payer: Self-pay | Source: Ambulatory Visit | Attending: Cardiology | Admitting: Cardiology

## 2017-03-16 ENCOUNTER — Encounter (HOSPITAL_COMMUNITY): Payer: Self-pay

## 2017-03-18 ENCOUNTER — Encounter (HOSPITAL_COMMUNITY)
Admission: RE | Admit: 2017-03-18 | Discharge: 2017-03-18 | Disposition: A | Discharge status: Home / Self Care | Payer: Self-pay | Source: Ambulatory Visit | Attending: Cardiology | Admitting: Cardiology

## 2017-03-20 ENCOUNTER — Encounter (HOSPITAL_COMMUNITY): Payer: Self-pay

## 2017-03-25 ENCOUNTER — Encounter (HOSPITAL_COMMUNITY)
Admission: RE | Admit: 2017-03-25 | Discharge: 2017-03-25 | Disposition: A | Discharge status: Home / Self Care | Payer: Self-pay | Source: Ambulatory Visit | Attending: Cardiology | Admitting: Cardiology

## 2017-03-25 DIAGNOSIS — I2510 Atherosclerotic heart disease of native coronary artery without angina pectoris: Secondary | ICD-10-CM | POA: Insufficient documentation

## 2017-03-27 ENCOUNTER — Encounter (HOSPITAL_COMMUNITY)
Admission: RE | Admit: 2017-03-27 | Discharge: 2017-03-27 | Disposition: A | Discharge status: Home / Self Care | Payer: Self-pay | Source: Ambulatory Visit | Attending: Cardiology | Admitting: Cardiology

## 2017-03-30 ENCOUNTER — Encounter (HOSPITAL_COMMUNITY): Payer: Self-pay

## 2017-03-30 ENCOUNTER — Other Ambulatory Visit: Payer: Self-pay

## 2017-03-30 DIAGNOSIS — I2510 Atherosclerotic heart disease of native coronary artery without angina pectoris: Secondary | ICD-10-CM

## 2017-03-30 MED ORDER — TICAGRELOR 60 MG PO TABS: 60.0000 mg | ORAL_TABLET | Freq: Two times a day (BID) | ORAL | 3 refills | Status: DC

## 2017-04-01 ENCOUNTER — Encounter (HOSPITAL_COMMUNITY)
Admission: RE | Admit: 2017-04-01 | Discharge: 2017-04-01 | Disposition: A | Discharge status: Home / Self Care | Payer: Self-pay | Source: Ambulatory Visit | Attending: Cardiology | Admitting: Cardiology

## 2017-04-02 ENCOUNTER — Encounter: Payer: Self-pay | Admitting: Family Medicine

## 2017-04-03 ENCOUNTER — Encounter (HOSPITAL_COMMUNITY): Payer: Self-pay

## 2017-04-06 ENCOUNTER — Encounter (HOSPITAL_COMMUNITY): Payer: Self-pay

## 2017-04-08 ENCOUNTER — Encounter (HOSPITAL_COMMUNITY)
Admission: RE | Admit: 2017-04-08 | Discharge: 2017-04-08 | Disposition: A | Discharge status: Home / Self Care | Payer: Self-pay | Source: Ambulatory Visit | Attending: Cardiology | Admitting: Cardiology

## 2017-04-10 ENCOUNTER — Encounter (HOSPITAL_COMMUNITY)
Admission: RE | Admit: 2017-04-10 | Discharge: 2017-04-10 | Disposition: A | Discharge status: Home / Self Care | Payer: Self-pay | Source: Ambulatory Visit | Attending: Cardiology | Admitting: Cardiology

## 2017-04-13 ENCOUNTER — Encounter (HOSPITAL_COMMUNITY)
Admission: RE | Admit: 2017-04-13 | Discharge: 2017-04-13 | Disposition: A | Discharge status: Home / Self Care | Payer: Self-pay | Source: Ambulatory Visit | Attending: Cardiology | Admitting: Cardiology

## 2017-04-15 ENCOUNTER — Encounter (HOSPITAL_COMMUNITY): Payer: Self-pay

## 2017-04-17 ENCOUNTER — Encounter (HOSPITAL_COMMUNITY): Payer: Self-pay

## 2017-04-20 ENCOUNTER — Encounter (HOSPITAL_COMMUNITY)
Admission: RE | Admit: 2017-04-20 | Discharge: 2017-04-20 | Disposition: A | Discharge status: Home / Self Care | Payer: Self-pay | Source: Ambulatory Visit | Attending: Cardiology | Admitting: Cardiology

## 2017-04-20 DIAGNOSIS — I2510 Atherosclerotic heart disease of native coronary artery without angina pectoris: Secondary | ICD-10-CM | POA: Insufficient documentation

## 2017-04-22 ENCOUNTER — Encounter (HOSPITAL_COMMUNITY)
Admission: RE | Admit: 2017-04-22 | Discharge: 2017-04-22 | Disposition: A | Discharge status: Home / Self Care | Payer: Self-pay | Source: Ambulatory Visit | Attending: Cardiology | Admitting: Cardiology

## 2017-04-24 ENCOUNTER — Encounter (HOSPITAL_COMMUNITY)
Admission: RE | Admit: 2017-04-24 | Discharge: 2017-04-24 | Disposition: A | Discharge status: Home / Self Care | Payer: Self-pay | Source: Ambulatory Visit | Attending: Cardiology | Admitting: Cardiology

## 2017-04-27 ENCOUNTER — Encounter (HOSPITAL_COMMUNITY)
Admission: RE | Admit: 2017-04-27 | Discharge: 2017-04-27 | Disposition: A | Discharge status: Home / Self Care | Payer: Self-pay | Source: Ambulatory Visit | Attending: Cardiology | Admitting: Cardiology

## 2017-04-29 ENCOUNTER — Encounter (HOSPITAL_COMMUNITY)
Admission: RE | Admit: 2017-04-29 | Discharge: 2017-04-29 | Disposition: A | Discharge status: Home / Self Care | Payer: Self-pay | Source: Ambulatory Visit | Attending: Cardiology | Admitting: Cardiology

## 2017-05-01 ENCOUNTER — Encounter (HOSPITAL_COMMUNITY): Payer: Self-pay

## 2017-05-04 ENCOUNTER — Encounter (HOSPITAL_COMMUNITY)
Admission: RE | Admit: 2017-05-04 | Discharge: 2017-05-04 | Disposition: A | Discharge status: Home / Self Care | Payer: Self-pay | Source: Ambulatory Visit | Attending: Cardiology | Admitting: Cardiology

## 2017-05-06 ENCOUNTER — Encounter (HOSPITAL_COMMUNITY): Payer: Self-pay

## 2017-05-08 ENCOUNTER — Encounter (HOSPITAL_COMMUNITY)
Admission: RE | Admit: 2017-05-08 | Discharge: 2017-05-08 | Disposition: A | Discharge status: Home / Self Care | Payer: Self-pay | Source: Ambulatory Visit | Attending: Cardiology | Admitting: Cardiology

## 2017-05-11 ENCOUNTER — Encounter (HOSPITAL_COMMUNITY)
Admission: RE | Admit: 2017-05-11 | Discharge: 2017-05-11 | Disposition: A | Discharge status: Home / Self Care | Payer: Self-pay | Source: Ambulatory Visit | Attending: Cardiology | Admitting: Cardiology

## 2017-05-13 ENCOUNTER — Encounter (HOSPITAL_COMMUNITY)
Admission: RE | Admit: 2017-05-13 | Discharge: 2017-05-13 | Disposition: A | Discharge status: Home / Self Care | Payer: Self-pay | Source: Ambulatory Visit | Attending: Cardiology | Admitting: Cardiology

## 2017-05-15 ENCOUNTER — Encounter (HOSPITAL_COMMUNITY)
Admission: RE | Admit: 2017-05-15 | Discharge: 2017-05-15 | Disposition: A | Discharge status: Home / Self Care | Payer: Self-pay | Source: Ambulatory Visit | Attending: Cardiology | Admitting: Cardiology

## 2017-05-18 ENCOUNTER — Encounter (HOSPITAL_COMMUNITY)
Admission: RE | Admit: 2017-05-18 | Discharge: 2017-05-18 | Disposition: A | Discharge status: Home / Self Care | Payer: Self-pay | Source: Ambulatory Visit | Attending: Cardiology | Admitting: Cardiology

## 2017-05-20 ENCOUNTER — Encounter (HOSPITAL_COMMUNITY): Payer: Self-pay

## 2017-05-20 NOTE — Progress Notes (Signed)
Henry Rogers Date of Birth: [birth year] Medical Record #[id number]  History of Present Illness: Henry Rogers is seen for follow up CAD. He is s/p inferior STEMI on 02/02/14. The Mid RCA was occluded with large thrombus load and a very ectatic vessel. He had cardiogenic shock. The RCA was stented with a 5.0x16 mm Veriflex stent post dilated to 6.0. EF was 45-50%. Other vessels had no significant disease. Repeat Echo in September 2015 showed that his LV function had normalized. EF 55-60%.  He was readmitted on May 06, 2015 with recurrent inferior infarct again associated with cardiogenic shock. The stent in the mid RCA was patent but the distal RCA was occluded with heavy thrombus. This was treated with POBA only with a 5.0 mm balloon.  Since then he has done very well on DAPT.  He currently denies any chest pain or SOB. He is doing [facility] three days a week and walking his dog on other days. His BP has been well controlled. He is tolerating his medication well.  He does have severe acid reflux. Has known hiatal hernia. Tried tapering down on his prilosec but symptoms worsened.   Allergies as of 05/26/2017      Reactions   Ace Inhibitors    No personal hx reaction, but 2 family members have had persistent cough with the class and he prefers not to take ACEi.      Medication List        Accurate as of 05/26/17  8:29 AM. Always use your most recent med list.          aspirin 81 MG tablet Take 81 mg by mouth daily.   atorvastatin 80 MG tablet Commonly known as:  LIPITOR TAKE 1 TABLET(80 MG) BY MOUTH DAILY 6 PM   CIALIS 5 MG tablet Generic drug:  tadalafil Take 1 tablet (5 mg total) by mouth at bedtime.   JUICE PLUS FIBRE PO Take 1 tablet by mouth 2 (two) times daily. Fruit blend and vegetable blend   Melatonin 10 MG Caps Take 10 mg by mouth at bedtime. Takes daily   metoprolol tartrate 25 MG tablet Commonly known as:  LOPRESSOR Take 1  tablet (25 mg total) by mouth 2 (two) times daily.   multivitamin tablet Take 1 tablet by mouth every evening.   nitroGLYCERIN 0.4 MG SL tablet Commonly known as:  NITROSTAT Place 1 tablet (0.4 mg total) under the tongue every 5 (five) minutes as needed for chest pain. MAX 3 doses   omeprazole 40 MG capsule Commonly known as:  PRILOSEC Take 1 capsule (40 mg total) by mouth daily.   ranitidine 75 MG tablet Commonly known as:  ZANTAC Take 75 mg by mouth at bedtime.   ticagrelor 60 MG Tabs tablet Commonly known as:  BRILINTA Take 1 tablet (60 mg total) by mouth 2 (two) times daily.   zolpidem 5 MG tablet Commonly known as:  AMBIEN TAKE 1 TABLET BY MOUTH AT BEDTIME AS NEEDED FOR SLEEP       Allergies  Allergen Reactions  . Ace Inhibitors     No personal hx reaction, but 2 family members have had persistent cough with the class and he prefers not to take ACEi.    Past Medical History:  Diagnosis Date  . CAD (coronary artery disease)    a. inf STEMI s/p BMS to mRCA (02/03/14)  b. inf STEMI s/p PCTA to RCA with slow reflow.  . Chronic diastolic CHF (  congestive heart failure) (HCC)    a. 2D echo on 05/18/15 LVEF 55-60%, akinesis basal inferior wall, G2DD   . GERD (gastroesophageal reflux disease)   . Hearing loss   . Hemorrhoids   . HH (hiatus hernia)   . Ischemic cardiomyopathy    a. ECHO 01/2014 EF 45-50% and akinesis of the entire inferior myocardium. Mild MR.   b. now resolved (EF 55-60% on 04/22/15)    . Metabolic syndrome   . OSA (obstructive sleep apnea)    a. mild not requiring CPAP at this time  . PAF (paroxysmal atrial fibrillation) (HCC)    a. brief episode after cardiac cath on 05/16/15- resolved after IV amiodarone.  . Tinnitus   . Varicose veins     Past Surgical History:  Procedure Laterality Date  . COLONOSCOPY  2006   Dr. Elnoria Howard  . CORONARY ANGIOPLASTY WITH STENT PLACEMENT  02/02/14   BMS to RCA  . ENDOVENOUS ABLATION SAPHENOUS VEIN W/ LASER Right  10-12-2014   EVLA right greater saphenous vein by Gretta Began MD  . stab phlebectomy  Right 03-15-2015   stab phlebectomy > 20 incisions (right leg) by Gretta Began MD    Social History   Socioeconomic History  . Marital status: Married    Spouse name: None  . Number of children: None  . Years of education: None  . Highest education level: None  Social Needs  . Financial resource strain: None  . Food insecurity - worry: None  . Food insecurity - inability: None  . Transportation needs - medical: None  . Transportation needs - non-medical: None  Occupational History  . None  Tobacco Use  . Smoking status: Former Games developer  . Smokeless tobacco: Never Used  Substance and Sexual Activity  . Alcohol use: Yes    Alcohol/week: 1.8 oz    Types: 3 Glasses of wine per week  . Drug use: No  . Sexual activity: Not Currently  Other Topics Concern  . None  Social History Narrative  . None    Family History  Problem Relation Age of Onset  . Hypertension Mother   . Cancer Maternal Grandmother     Review of Systems: As noted in HPI.  All other systems were reviewed and are negative.  Physical Exam: BP 115/82   Pulse 70   Ht 5\' 6"  (1.676 m)   Wt 187 lb 6.4 oz (85 kg)   BMI 30.25 kg/m  Filed Weights   05/26/17 0810  Weight: 187 lb 6.4 oz (85 kg)  GENERAL:  Well appearing HEENT:  PERRL, EOMI, sclera are clear. Oropharynx is clear. NECK:  No jugular venous distention, carotid upstroke brisk and symmetric, no bruits, no thyromegaly or adenopathy LUNGS:  Clear to auscultation bilaterally CHEST:  Unremarkable HEART:  RRR,  PMI not displaced or sustained,S1 and S2 within normal limits, no S3, no S4: no clicks, no rubs, no murmurs ABD:  Soft, nontender. BS +, no masses or bruits. No hepatomegaly, no splenomegaly EXT:  2 + pulses throughout, no edema, no cyanosis no clubbing SKIN:  Warm and dry.  No rashes NEURO:  Alert and oriented x 3. Cranial nerves II through XII intact. PSYCH:   Cognitively intact      LABORATORY DATA: Lab Results  Component Value Date   WBC 5.2 02/19/2017   HGB 15.5 02/19/2017   HCT 46.3 02/19/2017   PLT 214 02/19/2017   GLUCOSE 104 (H) 02/19/2017   CHOL 141 02/19/2017   TRIG 77  02/19/2017   HDL 49 02/19/2017   LDLCALC 77 02/19/2017   ALT 23 02/19/2017   AST 21 02/19/2017   NA 138 02/19/2017   K 4.5 02/19/2017   CL 103 02/19/2017   CREATININE 1.13 02/19/2017   BUN 18 02/19/2017   CO2 23 02/19/2017   TSH 0.973 02/02/2014   PSA 0.4 02/19/2017   INR 1.03 02/02/2014   HGBA1C 5.8 (H) 01/03/2015   Ecg today shows NSR with a normal Ecg. I have personally reviewed and interpreted this study.   Assessment / Plan: 1. CAD s/p inferior STEMI 02/02/14 with cardiogenic shock. S/p BMS of mid RCA. Very ectatic vessel. 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 Large ectatic vessel with a lot of thrombus. He is asymptomatic.   Given recurrent infarction I would recommend DAPT indefinitely. Now on ASA 81 mg daily and Brilinta 60 mg bid.  Continue statin and metoprolol.  Follow up in 6 months.  2. Ischemic cardiomyopathy with EF 45-50%. Repeat Echo showed normal LV function in October 2016. ACEi stopped before due to hypotension. Continue low dose metoprolol.  3. Hyperlipidemia.   Continue high dose statin. Continue dietary modification and control weight.   4. GERD

## 2017-05-21 ENCOUNTER — Other Ambulatory Visit: Payer: Self-pay | Admitting: Family Medicine

## 2017-05-22 ENCOUNTER — Encounter (HOSPITAL_COMMUNITY): Payer: Self-pay

## 2017-05-25 ENCOUNTER — Encounter (HOSPITAL_COMMUNITY): Payer: Self-pay

## 2017-05-25 DIAGNOSIS — I2510 Atherosclerotic heart disease of native coronary artery without angina pectoris: Secondary | ICD-10-CM | POA: Insufficient documentation

## 2017-05-26 ENCOUNTER — Encounter: Payer: Self-pay | Admitting: Cardiology

## 2017-05-26 ENCOUNTER — Ambulatory Visit (INDEPENDENT_AMBULATORY_CARE_PROVIDER_SITE_OTHER): Payer: Medicare Other | Admitting: Cardiology

## 2017-05-26 VITALS — BP 115/82 | HR 70 | Ht 66.0 in | Wt 187.4 lb

## 2017-05-26 DIAGNOSIS — E785 Hyperlipidemia, unspecified: Secondary | ICD-10-CM | POA: Diagnosis not present

## 2017-05-26 DIAGNOSIS — I2510 Atherosclerotic heart disease of native coronary artery without angina pectoris: Secondary | ICD-10-CM | POA: Diagnosis not present

## 2017-05-26 DIAGNOSIS — I252 Old myocardial infarction: Secondary | ICD-10-CM

## 2017-05-26 NOTE — Patient Instructions (Signed)
Continue your current therapy  Follow up in 6 months   

## 2017-05-27 ENCOUNTER — Encounter (HOSPITAL_COMMUNITY): Payer: Self-pay

## 2017-05-29 ENCOUNTER — Telehealth: Payer: Self-pay | Admitting: Cardiology

## 2017-05-29 ENCOUNTER — Encounter (HOSPITAL_COMMUNITY): Payer: Self-pay

## 2017-05-29 ENCOUNTER — Telehealth: Payer: Self-pay

## 2017-05-29 NOTE — Telephone Encounter (Signed)
New message    Pt is calling asking for a call back. He said he has a question about an injury on his leg.

## 2017-05-29 NOTE — Telephone Encounter (Signed)
Patient returned call to office, was transferred to medical records department. Received message from staff and attempted to reach patient. LMTCB

## 2017-05-29 NOTE — Telephone Encounter (Signed)
Left a message to call back.

## 2017-06-01 ENCOUNTER — Encounter (HOSPITAL_COMMUNITY): Payer: Self-pay

## 2017-06-03 ENCOUNTER — Encounter (HOSPITAL_COMMUNITY)
Admission: RE | Admit: 2017-06-03 | Discharge: 2017-06-03 | Disposition: A | Discharge status: Home / Self Care | Payer: Medicare Other | Source: Ambulatory Visit | Attending: Cardiology | Admitting: Cardiology

## 2017-06-03 NOTE — Telephone Encounter (Signed)
LMTCB  Sent patient a Wellsite geologist

## 2017-06-05 ENCOUNTER — Encounter (HOSPITAL_COMMUNITY): Payer: Self-pay

## 2017-06-08 ENCOUNTER — Encounter (HOSPITAL_COMMUNITY): Payer: Self-pay

## 2017-06-10 ENCOUNTER — Encounter (HOSPITAL_COMMUNITY): Payer: Self-pay

## 2017-06-15 ENCOUNTER — Encounter (HOSPITAL_COMMUNITY)
Admission: RE | Admit: 2017-06-15 | Discharge: 2017-06-15 | Disposition: A | Discharge status: Home / Self Care | Payer: Self-pay | Source: Ambulatory Visit | Attending: Cardiology | Admitting: Cardiology

## 2017-06-17 ENCOUNTER — Encounter (HOSPITAL_COMMUNITY): Payer: Self-pay

## 2017-06-19 ENCOUNTER — Encounter (HOSPITAL_COMMUNITY): Payer: Self-pay

## 2017-06-22 ENCOUNTER — Encounter (HOSPITAL_COMMUNITY)
Admission: RE | Admit: 2017-06-22 | Discharge: 2017-06-22 | Disposition: A | Discharge status: Home / Self Care | Payer: Self-pay | Source: Ambulatory Visit | Attending: Cardiology | Admitting: Cardiology

## 2017-06-22 DIAGNOSIS — I2510 Atherosclerotic heart disease of native coronary artery without angina pectoris: Secondary | ICD-10-CM | POA: Insufficient documentation

## 2017-06-24 ENCOUNTER — Encounter (HOSPITAL_COMMUNITY)
Admission: RE | Admit: 2017-06-24 | Discharge: 2017-06-24 | Disposition: A | Discharge status: Home / Self Care | Payer: Self-pay | Source: Ambulatory Visit | Attending: Cardiology | Admitting: Cardiology

## 2017-06-26 ENCOUNTER — Encounter (HOSPITAL_COMMUNITY)
Admission: RE | Admit: 2017-06-26 | Discharge: 2017-06-26 | Disposition: A | Discharge status: Home / Self Care | Payer: Self-pay | Source: Ambulatory Visit | Attending: Cardiology | Admitting: Cardiology

## 2017-06-30 ENCOUNTER — Ambulatory Visit: Payer: Medicare Other | Admitting: Family Medicine

## 2017-06-30 ENCOUNTER — Encounter: Payer: Self-pay | Admitting: Family Medicine

## 2017-06-30 ENCOUNTER — Other Ambulatory Visit: Payer: Self-pay | Admitting: *Deleted

## 2017-06-30 VITALS — BP 130/84 | HR 62 | Wt 186.4 lb

## 2017-06-30 DIAGNOSIS — J209 Acute bronchitis, unspecified: Secondary | ICD-10-CM

## 2017-06-30 MED ORDER — AMOXICILLIN-POT CLAVULANATE 875-125 MG PO TABS: 1.0000 | ORAL_TABLET | Freq: Two times a day (BID) | ORAL | 0 refills | Status: DC

## 2017-06-30 NOTE — Progress Notes (Signed)
   Subjective:    Patient ID: Henry Rogers, male    DOB: 1951/04/28, 66 y.o.   MRN: [id number]  HPI Approximately 2-1/2 weeks ago he developed nasal congestion, postnasal drainage, dry cough with rhinorrhea that progressed to becoming chest congestion , dry cough that has become productive.  No sore throat, earache, fever, chills.  The symptoms have gotten worse in the last week.  He does complain of some right-sided chest discomfort with coughing.  Review of Systems     Objective:   Physical Exam Alert and in no distress. Tympanic membranes and canals are normal. Pharyngeal area is normal. Neck is supple without adenopathy or thyromegaly. Cardiac exam shows a regular sinus rhythm without murmurs or gallops. Lungs are clear to auscultation.        Assessment & Plan:  Acute bronchitis, unspecified organism - Plan: amoxicillin-clavulanate (AUGMENTIN) 875-125 MG tablet He is to take all the antibiotic and also recommend NyQuil at night.  If he is not totally better he is to call me back.

## 2017-07-03 ENCOUNTER — Encounter: Payer: Self-pay | Admitting: Medical

## 2017-07-03 ENCOUNTER — Ambulatory Visit: Payer: Medicare Other | Admitting: Medical

## 2017-07-03 ENCOUNTER — Encounter (HOSPITAL_COMMUNITY): Payer: Self-pay

## 2017-07-03 VITALS — BP 112/70 | HR 63 | Temp 97.7°F | Wt 182.6 lb

## 2017-07-03 DIAGNOSIS — R195 Other fecal abnormalities: Secondary | ICD-10-CM

## 2017-07-03 DIAGNOSIS — H00015 Hordeolum externum left lower eyelid: Secondary | ICD-10-CM

## 2017-07-03 DIAGNOSIS — H5789 Other specified disorders of eye and adnexa: Secondary | ICD-10-CM | POA: Diagnosis not present

## 2017-07-03 MED ORDER — ERYTHROMYCIN 5 MG/GM OP OINT: 1.0000 "application " | TOPICAL_OINTMENT | Freq: Three times a day (TID) | OPHTHALMIC | 0 refills | Status: DC

## 2017-07-03 NOTE — Progress Notes (Signed)
Subjective: Chief Complaint  Patient presents with  . eye swollen    icthy , redness , soreness  on left eye    Here for 2 issues.  He reports 2-day history of left lower eyelid with redness, swelling, and pain, slight discharge.  No recent injury or trauma, no sick contacts with eye infection.  He notes some redness, but no crusting in the morning.  No vision change.  No frank eye pain, discomfort isolated to the lower eyelid on the left.  He used a warm compress once  He was just seen here recently with Dr. Susann GivensLalonde for respiratory tract infection, taking Augmentin  He reports a little over a week history of loose stools a few times per day, watery without blood or fever.  Denies belly or back pain, no recent travel.  No body aches or chills.  His wife has had similar loose stools.  Not worse, not improved though.  Past Medical History:  Diagnosis Date  . CAD (coronary artery disease)    a. inf STEMI s/p BMS to mRCA (02/03/14)  b. inf STEMI s/p PCTA to RCA with slow reflow.  . Chronic diastolic CHF (congestive heart failure) (HCC)    a. 2D echo on 05/18/15 LVEF 55-60%, akinesis basal inferior wall, G2DD   . GERD (gastroesophageal reflux disease)   . Hearing loss   . Hemorrhoids   . HH (hiatus hernia)   . Ischemic cardiomyopathy    a. ECHO 01/2014 EF 45-50% and akinesis of the entire inferior myocardium. Mild MR.   b. now resolved (EF 55-60% on 04/22/15)    . Metabolic syndrome   . OSA (obstructive sleep apnea)    a. mild not requiring CPAP at this time  . PAF (paroxysmal atrial fibrillation) (HCC)    a. brief episode after cardiac cath on 05/16/15- resolved after IV amiodarone.  . Tinnitus   . Varicose veins    Current Outpatient Medications on File Prior to Visit  Medication Sig Dispense Refill  . amoxicillin-clavulanate (AUGMENTIN) 875-125 MG tablet Take 1 tablet by mouth 2 (two) times daily. 20 tablet 0  . aspirin 81 MG tablet Take 81 mg by mouth daily.    [name]. atorvastatin  (LIPITOR) 80 MG tablet TAKE 1 TABLET(80 MG) BY MOUTH DAILY 6 PM 90 tablet 3  . CIALIS 5 MG tablet Take 1 tablet (5 mg total) by mouth at bedtime. 90 tablet 3  . Melatonin 10 MG CAPS Take 10 mg by mouth at bedtime. Takes daily    . metoprolol tartrate (LOPRESSOR) 25 MG tablet Take 1 tablet (25 mg total) by mouth 2 (two) times daily. 60 tablet 8  . Multiple Vitamin (MULTIVITAMIN) tablet Take 1 tablet by mouth every evening.     . nitroGLYCERIN (NITROSTAT) 0.4 MG SL tablet Place 1 tablet (0.4 mg total) under the tongue every 5 (five) minutes as needed for chest pain. MAX 3 doses 25 tablet 2  . Nutritional Supplements (JUICE PLUS FIBRE PO) Take 1 tablet by mouth 2 (two) times daily. Fruit blend and vegetable blend    . omeprazole (PRILOSEC) 40 MG capsule Take 1 capsule (40 mg total) by mouth daily. 90 capsule 3  . ranitidine (ZANTAC) 75 MG tablet Take 75 mg by mouth at bedtime.     . ticagrelor (BRILINTA) 60 MG TABS tablet Take 1 tablet (60 mg total) by mouth 2 (two) times daily. 180 tablet 3  . zolpidem (AMBIEN) 5 MG tablet TAKE 1 TABLET BY MOUTH AT BEDTIME  AS NEEDED FOR SLEEP (Patient not taking: Reported on 06/30/2017) 30 tablet 0   No current facility-administered medications on file prior to visit.    ROS as in subjective   Objective: BP 112/70   Pulse 63   Temp 97.7 F (36.5 C)   Wt 182 lb 9.6 oz (82.8 kg)   SpO2 98%   BMI 29.47 kg/m   General: Well-developed well-nourished no acute distress Left lower eyelid medially with somewhat tender, erythematous fullness, both lower conjunctiva injected, no obvious pus or discharge otherwise eye exam normal, normal EOMI, PERRLA Rest of HEENT exam unremarkable   Assessment: Encounter Diagnoses  Name Primary?  . Hordeolum externum of left lower eyelid Yes  . Eye redness   . Loose stools     Plan: Stye-can use TheraTears for supportive care, advised warm moist compress 2-3 times daily, discussed handwashing and hygiene, and if not  improving the next 2-3 days or if worse signs as discussed then begin the Romycin ointment as below  Loose stools-likely started with viral gastroenteritis given the household contact with similar, but also aggravated by Augmentin antibiotic which he is taking.  Advised good hydration, can use Pepto-Bismol or Imodium as needed in limited quantity, gave samples of IB Gard probiotic today.    I reviewed the visit notes here with Dr. Susann Givens from 3 days ago.  If worse or not improving within the next 4-5 days and call recheck  Chauncy was seen today for eye swollen.  Diagnoses and all orders for this visit:  Hordeolum externum of left lower eyelid  Eye redness  Loose stools  Other orders -     erythromycin [facility]) ophthalmic ointment; Place 1 application into both eyes 3 (three) times daily.

## 2017-07-06 ENCOUNTER — Encounter (HOSPITAL_COMMUNITY)
Admission: RE | Admit: 2017-07-06 | Discharge: 2017-07-06 | Disposition: A | Discharge status: Home / Self Care | Payer: Self-pay | Source: Ambulatory Visit | Attending: Cardiology | Admitting: Cardiology

## 2017-07-08 ENCOUNTER — Encounter (HOSPITAL_COMMUNITY): Payer: Self-pay

## 2017-07-08 ENCOUNTER — Telehealth: Payer: Self-pay | Admitting: Family Medicine

## 2017-07-08 ENCOUNTER — Other Ambulatory Visit: Payer: Self-pay | Admitting: *Deleted

## 2017-07-08 DIAGNOSIS — J209 Acute bronchitis, unspecified: Secondary | ICD-10-CM

## 2017-07-08 MED ORDER — AMOXICILLIN-POT CLAVULANATE 875-125 MG PO TABS: 1.0000 | ORAL_TABLET | Freq: Two times a day (BID) | ORAL | 0 refills | Status: DC

## 2017-07-08 NOTE — Telephone Encounter (Signed)
Pt called and stated that he finishes medication tomorrow and his cough is still persistent. He states it is keeping him up at night. He is requesting another round of medication. Pt uses [facility] on [address]. Pt can be reached at [phone].

## 2017-07-10 ENCOUNTER — Encounter (HOSPITAL_COMMUNITY)
Admission: RE | Admit: 2017-07-10 | Discharge: 2017-07-10 | Disposition: A | Discharge status: Home / Self Care | Payer: Self-pay | Source: Ambulatory Visit | Attending: Cardiology | Admitting: Cardiology

## 2017-07-15 ENCOUNTER — Encounter (HOSPITAL_COMMUNITY)
Admission: RE | Admit: 2017-07-15 | Discharge: 2017-07-15 | Disposition: A | Discharge status: Home / Self Care | Payer: Self-pay | Source: Ambulatory Visit | Attending: Cardiology | Admitting: Cardiology

## 2017-07-17 ENCOUNTER — Encounter (HOSPITAL_COMMUNITY)
Admission: RE | Admit: 2017-07-17 | Discharge: 2017-07-17 | Disposition: A | Discharge status: Home / Self Care | Payer: Self-pay | Source: Ambulatory Visit | Attending: Cardiology | Admitting: Cardiology

## 2017-07-20 ENCOUNTER — Encounter (HOSPITAL_COMMUNITY)
Admission: RE | Admit: 2017-07-20 | Discharge: 2017-07-20 | Disposition: A | Discharge status: Home / Self Care | Payer: Self-pay | Source: Ambulatory Visit | Attending: Cardiology | Admitting: Cardiology

## 2017-07-22 ENCOUNTER — Encounter (HOSPITAL_COMMUNITY)
Admission: RE | Admit: 2017-07-22 | Discharge: 2017-07-22 | Disposition: A | Discharge status: Home / Self Care | Payer: Medicare Other | Source: Ambulatory Visit | Attending: Cardiology | Admitting: Cardiology

## 2017-07-22 DIAGNOSIS — I2510 Atherosclerotic heart disease of native coronary artery without angina pectoris: Secondary | ICD-10-CM | POA: Insufficient documentation

## 2017-07-24 ENCOUNTER — Encounter (HOSPITAL_COMMUNITY): Payer: Self-pay

## 2017-07-27 ENCOUNTER — Encounter (HOSPITAL_COMMUNITY)
Admission: RE | Admit: 2017-07-27 | Discharge: 2017-07-27 | Disposition: A | Discharge status: Home / Self Care | Payer: Self-pay | Source: Ambulatory Visit | Attending: Cardiology | Admitting: Cardiology

## 2017-07-28 ENCOUNTER — Telehealth: Payer: Self-pay | Admitting: Family Medicine

## 2017-07-28 DIAGNOSIS — G4733 Obstructive sleep apnea (adult) (pediatric): Secondary | ICD-10-CM

## 2017-07-28 DIAGNOSIS — N400 Enlarged prostate without lower urinary tract symptoms: Secondary | ICD-10-CM

## 2017-07-28 MED ORDER — TADALAFIL 5 MG PO TABS: 5.0000 mg | ORAL_TABLET | Freq: Every day | ORAL | 3 refills | Status: DC

## 2017-07-28 MED ORDER — TADALAFIL 20 MG PO TABS: 20.0000 mg | ORAL_TABLET | Freq: Every day | ORAL | 0 refills | Status: DC | PRN

## 2017-07-28 NOTE — Telephone Encounter (Signed)
He called asking for his Cialis 5 mg to be renewed for generic which I did.  He would also like to try the 20 mg pill as apparently the 5 mg is not helping with ED but does help with his bladder.

## 2017-07-28 NOTE — Telephone Encounter (Signed)
Pt called and would like for you to give him a call, about a RX states he has a couple of questions pt can be reached at [phone]

## 2017-07-28 NOTE — Addendum Note (Signed)
Addended by: Ronnald Nian on: 07/28/2017 02:44 PM   Modules accepted: Orders

## 2017-07-29 ENCOUNTER — Encounter (HOSPITAL_COMMUNITY): Payer: Self-pay

## 2017-07-31 ENCOUNTER — Encounter (HOSPITAL_COMMUNITY)
Admission: RE | Admit: 2017-07-31 | Discharge: 2017-07-31 | Disposition: A | Discharge status: Home / Self Care | Payer: Medicare Other | Source: Ambulatory Visit | Attending: Cardiology | Admitting: Cardiology

## 2017-08-02 ENCOUNTER — Telehealth: Payer: Self-pay | Admitting: Family Medicine

## 2017-08-02 NOTE — Telephone Encounter (Signed)
Renewal for P.A. TADALAFIL/CIALIS completed

## 2017-08-03 ENCOUNTER — Encounter (HOSPITAL_COMMUNITY)
Admission: RE | Admit: 2017-08-03 | Discharge: 2017-08-03 | Disposition: A | Discharge status: Home / Self Care | Payer: Self-pay | Source: Ambulatory Visit | Attending: Cardiology | Admitting: Cardiology

## 2017-08-05 ENCOUNTER — Encounter (HOSPITAL_COMMUNITY)
Admission: RE | Admit: 2017-08-05 | Discharge: 2017-08-05 | Disposition: A | Discharge status: Home / Self Care | Payer: Self-pay | Source: Ambulatory Visit | Attending: Cardiology | Admitting: Cardiology

## 2017-08-06 ENCOUNTER — Telehealth: Payer: Self-pay | Admitting: Cardiology

## 2017-08-06 NOTE — Telephone Encounter (Signed)
Returned call to patient no answer.LMTC. 

## 2017-08-06 NOTE — Telephone Encounter (Signed)
Pt says he needs a letter for immediate medical attention.Pt is going on a cruise and needs this letter for it please.

## 2017-08-07 ENCOUNTER — Encounter (HOSPITAL_COMMUNITY): Payer: Self-pay

## 2017-08-08 NOTE — Telephone Encounter (Signed)
Cialis 5mg  approved til 07/20/18, pt informed, he states also given Cialis 20mg  and needs P.A. For that also

## 2017-08-09 ENCOUNTER — Telehealth: Payer: Self-pay | Admitting: Family Medicine

## 2017-08-09 NOTE — Telephone Encounter (Signed)
P.A. CIALIS 20MG 

## 2017-08-10 ENCOUNTER — Encounter (HOSPITAL_COMMUNITY): Payer: Self-pay

## 2017-08-11 NOTE — Telephone Encounter (Signed)
P.A. Cialis 20mg  denied Sexual dysfunction drugs are not covered

## 2017-08-12 ENCOUNTER — Encounter (HOSPITAL_COMMUNITY): Payer: Self-pay

## 2017-08-13 NOTE — Telephone Encounter (Signed)
Returned call to patient no answer.LMTC. 

## 2017-08-14 ENCOUNTER — Encounter (HOSPITAL_COMMUNITY): Payer: Self-pay

## 2017-08-14 NOTE — Telephone Encounter (Signed)
Patient never returned call  

## 2017-08-17 ENCOUNTER — Other Ambulatory Visit: Payer: Self-pay | Admitting: Family Medicine

## 2017-08-17 ENCOUNTER — Encounter (HOSPITAL_COMMUNITY): Payer: Self-pay

## 2017-08-19 ENCOUNTER — Encounter (HOSPITAL_COMMUNITY): Payer: Self-pay

## 2017-08-21 ENCOUNTER — Encounter (HOSPITAL_COMMUNITY): Payer: Medicare Other

## 2017-08-21 DIAGNOSIS — I2510 Atherosclerotic heart disease of native coronary artery without angina pectoris: Secondary | ICD-10-CM | POA: Insufficient documentation

## 2017-08-24 ENCOUNTER — Encounter (HOSPITAL_COMMUNITY): Payer: Medicare Other

## 2017-08-26 ENCOUNTER — Encounter (HOSPITAL_COMMUNITY): Payer: Medicare Other

## 2017-08-27 ENCOUNTER — Other Ambulatory Visit: Payer: Self-pay | Admitting: *Deleted

## 2017-08-27 MED ORDER — NITROGLYCERIN 0.4 MG SL SUBL: 0.4000 mg | SUBLINGUAL_TABLET | SUBLINGUAL | 7 refills | Status: DC | PRN

## 2017-08-28 ENCOUNTER — Encounter (HOSPITAL_COMMUNITY): Payer: Medicare Other

## 2017-08-31 ENCOUNTER — Encounter (HOSPITAL_COMMUNITY): Payer: Medicare Other

## 2017-09-02 ENCOUNTER — Encounter (HOSPITAL_COMMUNITY): Payer: Medicare Other

## 2017-09-04 ENCOUNTER — Encounter (HOSPITAL_COMMUNITY): Payer: Medicare Other

## 2017-09-07 ENCOUNTER — Encounter (HOSPITAL_COMMUNITY)
Admission: RE | Admit: 2017-09-07 | Discharge: 2017-09-07 | Disposition: A | Discharge status: Home / Self Care | Payer: Medicare Other | Source: Ambulatory Visit | Attending: Cardiology | Admitting: Cardiology

## 2017-09-09 ENCOUNTER — Encounter (HOSPITAL_COMMUNITY)
Admission: RE | Admit: 2017-09-09 | Discharge: 2017-09-09 | Disposition: A | Discharge status: Home / Self Care | Payer: Medicare Other | Source: Ambulatory Visit | Attending: Cardiology | Admitting: Cardiology

## 2017-09-11 ENCOUNTER — Encounter (HOSPITAL_COMMUNITY)
Admission: RE | Admit: 2017-09-11 | Discharge: 2017-09-11 | Disposition: A | Discharge status: Home / Self Care | Payer: Medicare Other | Source: Ambulatory Visit | Attending: Cardiology | Admitting: Cardiology

## 2017-09-14 ENCOUNTER — Encounter (HOSPITAL_COMMUNITY)
Admission: RE | Admit: 2017-09-14 | Discharge: 2017-09-14 | Disposition: A | Discharge status: Home / Self Care | Payer: Medicare Other | Source: Ambulatory Visit | Attending: Cardiology | Admitting: Cardiology

## 2017-09-16 ENCOUNTER — Encounter (HOSPITAL_COMMUNITY): Payer: Medicare Other

## 2017-09-18 ENCOUNTER — Encounter (HOSPITAL_COMMUNITY): Payer: Self-pay

## 2017-09-18 DIAGNOSIS — I2510 Atherosclerotic heart disease of native coronary artery without angina pectoris: Secondary | ICD-10-CM | POA: Insufficient documentation

## 2017-09-21 ENCOUNTER — Encounter (HOSPITAL_COMMUNITY): Payer: Self-pay

## 2017-09-23 ENCOUNTER — Encounter (HOSPITAL_COMMUNITY): Payer: Self-pay

## 2017-09-25 ENCOUNTER — Encounter (HOSPITAL_COMMUNITY): Payer: Self-pay

## 2017-09-28 ENCOUNTER — Encounter (HOSPITAL_COMMUNITY): Payer: Self-pay

## 2017-09-30 ENCOUNTER — Encounter (HOSPITAL_COMMUNITY)
Admission: RE | Admit: 2017-09-30 | Discharge: 2017-09-30 | Disposition: A | Discharge status: Home / Self Care | Payer: Self-pay | Source: Ambulatory Visit | Attending: Cardiology | Admitting: Cardiology

## 2017-10-02 ENCOUNTER — Encounter (HOSPITAL_COMMUNITY)
Admission: RE | Admit: 2017-10-02 | Discharge: 2017-10-02 | Disposition: A | Discharge status: Home / Self Care | Payer: Medicare Other | Source: Ambulatory Visit | Attending: Cardiology | Admitting: Cardiology

## 2017-10-05 ENCOUNTER — Encounter (HOSPITAL_COMMUNITY)
Admission: RE | Admit: 2017-10-05 | Discharge: 2017-10-05 | Disposition: A | Discharge status: Home / Self Care | Payer: Self-pay | Source: Ambulatory Visit | Attending: Cardiology | Admitting: Cardiology

## 2017-10-07 ENCOUNTER — Encounter (HOSPITAL_COMMUNITY)
Admission: RE | Admit: 2017-10-07 | Discharge: 2017-10-07 | Disposition: A | Discharge status: Home / Self Care | Payer: Self-pay | Source: Ambulatory Visit | Attending: Cardiology | Admitting: Cardiology

## 2017-10-09 ENCOUNTER — Encounter (HOSPITAL_COMMUNITY): Payer: Self-pay

## 2017-10-12 ENCOUNTER — Encounter (HOSPITAL_COMMUNITY)
Admission: RE | Admit: 2017-10-12 | Discharge: 2017-10-12 | Disposition: A | Discharge status: Home / Self Care | Payer: Self-pay | Source: Ambulatory Visit | Attending: Cardiology | Admitting: Cardiology

## 2017-10-14 ENCOUNTER — Encounter (HOSPITAL_COMMUNITY): Payer: Self-pay

## 2017-10-16 ENCOUNTER — Encounter (HOSPITAL_COMMUNITY)
Admission: RE | Admit: 2017-10-16 | Discharge: 2017-10-16 | Disposition: A | Discharge status: Home / Self Care | Payer: Self-pay | Source: Ambulatory Visit | Attending: Cardiology | Admitting: Cardiology

## 2017-10-19 ENCOUNTER — Encounter (HOSPITAL_COMMUNITY): Payer: Self-pay

## 2017-10-19 DIAGNOSIS — I2510 Atherosclerotic heart disease of native coronary artery without angina pectoris: Secondary | ICD-10-CM | POA: Insufficient documentation

## 2017-10-21 ENCOUNTER — Encounter (HOSPITAL_COMMUNITY)
Admission: RE | Admit: 2017-10-21 | Discharge: 2017-10-21 | Disposition: A | Discharge status: Home / Self Care | Payer: Medicare Other | Source: Ambulatory Visit | Attending: Cardiology | Admitting: Cardiology

## 2017-10-23 ENCOUNTER — Encounter (HOSPITAL_COMMUNITY)
Admission: RE | Admit: 2017-10-23 | Discharge: 2017-10-23 | Disposition: A | Discharge status: Home / Self Care | Payer: Medicare Other | Source: Ambulatory Visit | Attending: Cardiology | Admitting: Cardiology

## 2017-10-26 ENCOUNTER — Encounter (HOSPITAL_COMMUNITY)
Admission: RE | Admit: 2017-10-26 | Discharge: 2017-10-26 | Disposition: A | Discharge status: Home / Self Care | Payer: Self-pay | Source: Ambulatory Visit | Attending: Cardiology | Admitting: Cardiology

## 2017-10-28 ENCOUNTER — Encounter (HOSPITAL_COMMUNITY)
Admission: RE | Admit: 2017-10-28 | Discharge: 2017-10-28 | Disposition: A | Discharge status: Home / Self Care | Payer: Self-pay | Source: Ambulatory Visit | Attending: Cardiology | Admitting: Cardiology

## 2017-10-30 ENCOUNTER — Encounter (HOSPITAL_COMMUNITY)
Admission: RE | Admit: 2017-10-30 | Discharge: 2017-10-30 | Disposition: A | Discharge status: Home / Self Care | Payer: Self-pay | Source: Ambulatory Visit | Attending: Cardiology | Admitting: Cardiology

## 2017-11-02 ENCOUNTER — Encounter (HOSPITAL_COMMUNITY)
Admission: RE | Admit: 2017-11-02 | Discharge: 2017-11-02 | Disposition: A | Discharge status: Home / Self Care | Payer: Self-pay | Source: Ambulatory Visit | Attending: Cardiology | Admitting: Cardiology

## 2017-11-04 ENCOUNTER — Encounter (HOSPITAL_COMMUNITY)
Admission: RE | Admit: 2017-11-04 | Discharge: 2017-11-04 | Disposition: A | Discharge status: Home / Self Care | Payer: Self-pay | Source: Ambulatory Visit | Attending: Cardiology | Admitting: Cardiology

## 2017-11-06 ENCOUNTER — Encounter (HOSPITAL_COMMUNITY): Payer: Self-pay

## 2017-11-09 ENCOUNTER — Encounter (HOSPITAL_COMMUNITY)
Admission: RE | Admit: 2017-11-09 | Discharge: 2017-11-09 | Disposition: A | Discharge status: Home / Self Care | Payer: Self-pay | Source: Ambulatory Visit | Attending: Cardiology | Admitting: Cardiology

## 2017-11-11 ENCOUNTER — Encounter: Payer: Self-pay | Admitting: Cardiology

## 2017-11-11 ENCOUNTER — Encounter (HOSPITAL_COMMUNITY)
Admission: RE | Admit: 2017-11-11 | Discharge: 2017-11-11 | Disposition: A | Discharge status: Home / Self Care | Payer: Medicare Other | Source: Ambulatory Visit | Attending: Cardiology | Admitting: Cardiology

## 2017-11-13 ENCOUNTER — Encounter (HOSPITAL_COMMUNITY)
Admission: RE | Admit: 2017-11-13 | Discharge: 2017-11-13 | Disposition: A | Discharge status: Home / Self Care | Payer: Medicare Other | Source: Ambulatory Visit | Attending: Cardiology | Admitting: Cardiology

## 2017-11-16 ENCOUNTER — Encounter (HOSPITAL_COMMUNITY)
Admission: RE | Admit: 2017-11-16 | Discharge: 2017-11-16 | Disposition: A | Discharge status: Home / Self Care | Payer: Self-pay | Source: Ambulatory Visit | Attending: Cardiology | Admitting: Cardiology

## 2017-11-18 ENCOUNTER — Encounter (HOSPITAL_COMMUNITY)
Admission: RE | Admit: 2017-11-18 | Discharge: 2017-11-18 | Disposition: A | Discharge status: Home / Self Care | Payer: Self-pay | Source: Ambulatory Visit | Attending: Cardiology | Admitting: Cardiology

## 2017-11-18 DIAGNOSIS — I2510 Atherosclerotic heart disease of native coronary artery without angina pectoris: Secondary | ICD-10-CM | POA: Insufficient documentation

## 2017-11-20 ENCOUNTER — Other Ambulatory Visit: Payer: Self-pay | Admitting: Family Medicine

## 2017-11-20 ENCOUNTER — Encounter (HOSPITAL_COMMUNITY): Payer: Self-pay

## 2017-11-21 NOTE — Progress Notes (Signed)
Henry Rogers Date of Birth: Oct 09, 1950 Medical Record #[id number]  History of Present Illness: Henry Rogers is seen for follow up CAD. He is s/p inferior STEMI on 02/02/14. The Mid RCA was occluded with large thrombus load and a very ectatic vessel. He had cardiogenic shock. The RCA was stented with a 5.0x16 mm Veriflex stent post dilated to 6.0. EF was 45-50%. Other vessels had no significant disease. Repeat Echo in September 2015 showed that his LV function had normalized. EF 55-60%.  He was readmitted on May 06, 2015 with recurrent inferior infarct again associated with cardiogenic shock. The stent in the mid RCA was patent but the distal RCA was occluded with heavy thrombus. This was treated with POBA only with a 5.0 mm balloon.  Since then he has done very well on DAPT.    He continues to do [facility] at [facility] three days a week and walks his dog daily. His BP has been well controlled. He is tolerating his medication well.  He denies any chest pain or SOB. No palpitations, bleeding, dizziness.   Allergies as of 11/24/2017      Reactions   Ace Inhibitors    No personal hx reaction, but 2 family members have had persistent cough with the class and he prefers not to take ACEi.      Medication List        Accurate as of 11/24/17  8:21 AM. Always use your most recent med list.          aspirin 81 MG tablet Take 81 mg by mouth daily.   atorvastatin 80 MG tablet Commonly known as:  LIPITOR TAKE 1 TABLET(80 MG) BY MOUTH DAILY 6 PM   JUICE PLUS FIBRE PO Take 1 tablet by mouth 2 (two) times daily. Fruit blend and vegetable blend   Melatonin 10 MG Caps Take 10 mg by mouth at bedtime. Takes daily   metoprolol tartrate 25 MG tablet Commonly known as:  LOPRESSOR Take 1 tablet (25 mg total) by mouth 2 (two) times daily.   multivitamin tablet Take 1 tablet by mouth every evening.   nitroGLYCERIN 0.4 MG SL tablet Commonly known as:  NITROSTAT Place 1  tablet (0.4 mg total) under the tongue every 5 (five) minutes as needed for chest pain. MAX 3 doses   omeprazole 40 MG capsule Commonly known as:  PRILOSEC TAKE 1 CAPSULE BY MOUTH DAILY   ranitidine 75 MG tablet Commonly known as:  ZANTAC Take 75 mg by mouth at bedtime.   tadalafil 5 MG tablet Commonly known as:  CIALIS Take 1 tablet (5 mg total) by mouth at bedtime.   ticagrelor 60 MG Tabs tablet Commonly known as:  BRILINTA Take 1 tablet (60 mg total) by mouth 2 (two) times daily.   zolpidem 5 MG tablet Commonly known as:  AMBIEN TAKE 1 TABLET BY MOUTH AT BEDTIME AS NEEDED FOR SLEEP       Allergies  Allergen Reactions  . Ace Inhibitors     No personal hx reaction, but 2 family members have had persistent cough with the class and he prefers not to take ACEi.    Past Medical History:  Diagnosis Date  . CAD (coronary artery disease)    a. inf STEMI s/p BMS to mRCA (02/03/14)  b. inf STEMI s/p PCTA to RCA with slow reflow.  . Chronic diastolic CHF (congestive heart failure) (HCC)    a. 2D echo on 05/18/15 LVEF 55-60%, akinesis basal inferior  wall, G2DD   . GERD (gastroesophageal reflux disease)   . Hearing loss   . Hemorrhoids   . HH (hiatus hernia)   . Ischemic cardiomyopathy    a. ECHO 01/2014 EF 45-50% and akinesis of the entire inferior myocardium. Mild MR.   b. now resolved (EF 55-60% on 04/22/15)    . Metabolic syndrome   . OSA (obstructive sleep apnea)    a. mild not requiring CPAP at this time  . PAF (paroxysmal atrial fibrillation) (HCC)    a. brief episode after cardiac cath on 05/16/15- resolved after IV amiodarone.  . Tinnitus   . Varicose veins     Past Surgical History:  Procedure Laterality Date  . CARDIAC CATHETERIZATION N/A 05/16/2015   Procedure: Left Heart Cath and Coronary Angiography;  Surgeon: Lyn Records, MD;  Location: [facility] INVASIVE CV LAB;  Service: Cardiovascular;  Laterality: N/A;  . CARDIAC CATHETERIZATION N/A 05/16/2015   Procedure:  Coronary Stent Intervention;  Surgeon: Lyn Records, MD;  Location: [facility] INVASIVE CV LAB;  Service: Cardiovascular;  Laterality: N/A;  . CARPAL TUNNEL RELEASE Bilateral 03/02/2015   Procedure: BILATERAL CARPAL TUNNEL RELEASE ;  Surgeon: Cindee Salt, MD;  Location: [facility];  Service: Orthopedics;  Laterality: Bilateral;  . COLONOSCOPY  2006   Dr. Elnoria Howard  . CORONARY ANGIOPLASTY WITH STENT PLACEMENT  02/02/14   BMS to RCA  . ENDOVENOUS ABLATION SAPHENOUS VEIN W/ LASER Right 10-12-2014   EVLA right greater saphenous vein by Gretta Began MD  . LEFT HEART CATHETERIZATION WITH CORONARY ANGIOGRAM N/A 02/02/2014   Procedure: LEFT HEART CATHETERIZATION WITH CORONARY ANGIOGRAM;  Surgeon: Peter M Swaziland, MD;  Location[facility] CATH LAB;  Service: Cardiovascular;  Laterality: N/A;  . stab phlebectomy  Right 03-15-2015   stab phlebectomy > 20 incisions (right leg) by Gretta Began MD    Social History   Socioeconomic History  . Marital status: Married    Spouse name: Not on file  . Number of children: Not on file  . Years of education: Not on file  . Highest education level: Not on file  Occupational History  . Not on file  Social Needs  . Financial resource strain: Not on file  . Food insecurity:    Worry: Not on file    Inability: Not on file  . Transportation needs:    Medical: Not on file    Non-medical: Not on file  Tobacco Use  . Smoking status: Former Games developer  . Smokeless tobacco: Never Used  Substance and Sexual Activity  . Alcohol use: Yes    Alcohol/week: 1.8 oz    Types: 3 Glasses of wine per week  . Drug use: No  . Sexual activity: Not Currently  Lifestyle  . Physical activity:    Days per week: Not on file    Minutes per session: Not on file  . Stress: Not on file  Relationships  . Social connections:    Talks on phone: Not on file    Gets together: Not on file    Attends religious service: Not on file    Active member of club or organization: Not on file     Attends meetings of clubs or organizations: Not on file    Relationship status: Not on file  Other Topics Concern  . Not on file  Social History Narrative  . Not on file    Family History  Problem Relation Age of Onset  . Hypertension Mother   .  Cancer Maternal Grandmother     Review of Systems: As noted in HPI.  All other systems were reviewed and are negative.  Physical Exam: BP 112/70 (BP Location: Left Arm, Patient Position: Sitting, Cuff Size: Normal)   Pulse 76   Ht 5\' 6"  (1.676 m)   Wt 187 lb (84.8 kg)   BMI 30.18 kg/m  Filed Weights   11/24/17 0804  Weight: 187 lb (84.8 kg)  GENERAL:  Well appearing WM in  NAD HEENT:  PERRL, EOMI, sclera are clear. Oropharynx is clear. NECK:  No jugular venous distention, carotid upstroke brisk and symmetric, no bruits, no thyromegaly or adenopathy LUNGS:  Clear to auscultation bilaterally CHEST:  Unremarkable HEART:  RRR,  PMI not displaced or sustained,S1 and S2 within normal limits, no S3, no S4: no clicks, no rubs, no murmurs ABD:  Soft, nontender. BS +, no masses or bruits. No hepatomegaly, no splenomegaly EXT:  2 + pulses throughout, no edema, no cyanosis no clubbing SKIN:  Warm and dry.  No rashes NEURO:  Alert and oriented x 3. Cranial nerves II through XII intact. PSYCH:  Cognitively intact      LABORATORY DATA: Lab Results  Component Value Date   WBC 5.2 02/19/2017   HGB 15.5 02/19/2017   HCT 46.3 02/19/2017   PLT 214 02/19/2017   GLUCOSE 104 (H) 02/19/2017   CHOL 141 02/19/2017   TRIG 77 02/19/2017   HDL 49 02/19/2017   LDLCALC 77 02/19/2017   ALT 23 02/19/2017   AST 21 02/19/2017   NA 138 02/19/2017   K 4.5 02/19/2017   CL 103 02/19/2017   CREATININE 1.13 02/19/2017   BUN 18 02/19/2017   CO2 23 02/19/2017   TSH 0.973 02/02/2014   PSA 0.4 02/19/2017   INR 1.03 02/02/2014   HGBA1C 5.8 (H) 01/03/2015   Assessment / Plan: 1. CAD s/p inferior STEMI 02/02/14 with cardiogenic shock. S/p BMS of mid RCA.  Very ectatic vessel. 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 Large ectatic vessel with a lot of thrombus. He remains asymptomatic.   Given recurrent infarction I would recommend DAPT indefinitely. Now on ASA 81 mg daily and Brilinta 60 mg bid.  We did discuss the COMPASS trial using low dose Xarelto and ASA for CV risk reduction. given that his events were primarily platelet driven I would recommend continuing DAPT rather than switching to the Xarelto/ASA regimen. Continue statin and metoprolol.  Follow up in 6 months.  2. Ischemic cardiomyopathy with EF 45-50%. Repeat Echo showed normal LV function in October 2016. ACEi stopped before due to hypotension. Continue low dose metoprolol.  3. Hyperlipidemia.   Continue high dose statin. Continue dietary modification and control weight. He is due for complete lab work with his physical with Dr. Susann Givens this summer. Goal LDL < 70.  4. GERD

## 2017-11-23 ENCOUNTER — Encounter (HOSPITAL_COMMUNITY)
Admission: RE | Admit: 2017-11-23 | Discharge: 2017-11-23 | Disposition: A | Discharge status: Home / Self Care | Payer: Self-pay | Source: Ambulatory Visit | Attending: Cardiology | Admitting: Cardiology

## 2017-11-24 ENCOUNTER — Encounter: Payer: Self-pay | Admitting: Cardiology

## 2017-11-24 ENCOUNTER — Ambulatory Visit: Payer: Medicare Other | Admitting: Cardiology

## 2017-11-24 VITALS — BP 112/70 | HR 76 | Ht 66.0 in | Wt 187.0 lb

## 2017-11-24 DIAGNOSIS — I2510 Atherosclerotic heart disease of native coronary artery without angina pectoris: Secondary | ICD-10-CM

## 2017-11-24 DIAGNOSIS — I252 Old myocardial infarction: Secondary | ICD-10-CM | POA: Diagnosis not present

## 2017-11-24 DIAGNOSIS — E785 Hyperlipidemia, unspecified: Secondary | ICD-10-CM | POA: Diagnosis not present

## 2017-11-24 NOTE — Patient Instructions (Addendum)
Continue your current therapy  I will see you in 6 months.   

## 2017-11-25 ENCOUNTER — Encounter (HOSPITAL_COMMUNITY)
Admission: RE | Admit: 2017-11-25 | Discharge: 2017-11-25 | Disposition: A | Discharge status: Home / Self Care | Payer: Self-pay | Source: Ambulatory Visit | Attending: Cardiology | Admitting: Cardiology

## 2017-11-27 ENCOUNTER — Encounter (HOSPITAL_COMMUNITY): Payer: Self-pay

## 2017-11-30 ENCOUNTER — Encounter (HOSPITAL_COMMUNITY): Payer: Self-pay

## 2017-12-02 ENCOUNTER — Encounter (HOSPITAL_COMMUNITY): Payer: Self-pay

## 2017-12-04 ENCOUNTER — Encounter (HOSPITAL_COMMUNITY)
Admission: RE | Admit: 2017-12-04 | Discharge: 2017-12-04 | Disposition: A | Discharge status: Home / Self Care | Payer: Medicare Other | Source: Ambulatory Visit | Attending: Cardiology | Admitting: Cardiology

## 2017-12-07 ENCOUNTER — Encounter (HOSPITAL_COMMUNITY)
Admission: RE | Admit: 2017-12-07 | Discharge: 2017-12-07 | Disposition: A | Discharge status: Home / Self Care | Payer: Self-pay | Source: Ambulatory Visit | Attending: Cardiology | Admitting: Cardiology

## 2017-12-09 ENCOUNTER — Encounter (HOSPITAL_COMMUNITY)
Admission: RE | Admit: 2017-12-09 | Discharge: 2017-12-09 | Disposition: A | Discharge status: Home / Self Care | Payer: Self-pay | Source: Ambulatory Visit | Attending: Cardiology | Admitting: Cardiology

## 2017-12-11 ENCOUNTER — Encounter (HOSPITAL_COMMUNITY): Payer: Self-pay

## 2017-12-16 ENCOUNTER — Encounter (HOSPITAL_COMMUNITY)
Admission: RE | Admit: 2017-12-16 | Discharge: 2017-12-16 | Disposition: A | Discharge status: Home / Self Care | Payer: Medicare Other | Source: Ambulatory Visit | Attending: Cardiology | Admitting: Cardiology

## 2017-12-18 ENCOUNTER — Encounter (HOSPITAL_COMMUNITY)
Admission: RE | Admit: 2017-12-18 | Discharge: 2017-12-18 | Disposition: A | Discharge status: Home / Self Care | Payer: Medicare Other | Source: Ambulatory Visit | Attending: Cardiology | Admitting: Cardiology

## 2017-12-21 ENCOUNTER — Encounter (HOSPITAL_COMMUNITY)
Admission: RE | Admit: 2017-12-21 | Discharge: 2017-12-21 | Disposition: A | Discharge status: Home / Self Care | Payer: Self-pay | Source: Ambulatory Visit | Attending: Cardiology | Admitting: Cardiology

## 2017-12-21 DIAGNOSIS — I2510 Atherosclerotic heart disease of native coronary artery without angina pectoris: Secondary | ICD-10-CM | POA: Insufficient documentation

## 2017-12-23 ENCOUNTER — Encounter (HOSPITAL_COMMUNITY): Payer: Self-pay

## 2017-12-25 ENCOUNTER — Encounter (HOSPITAL_COMMUNITY)
Admission: RE | Admit: 2017-12-25 | Discharge: 2017-12-25 | Disposition: A | Discharge status: Home / Self Care | Payer: Self-pay | Source: Ambulatory Visit | Attending: Cardiology | Admitting: Cardiology

## 2017-12-28 ENCOUNTER — Encounter (HOSPITAL_COMMUNITY)
Admission: RE | Admit: 2017-12-28 | Discharge: 2017-12-28 | Disposition: A | Discharge status: Home / Self Care | Payer: Self-pay | Source: Ambulatory Visit | Attending: Cardiology | Admitting: Cardiology

## 2017-12-30 ENCOUNTER — Encounter (HOSPITAL_COMMUNITY)
Admission: RE | Admit: 2017-12-30 | Discharge: 2017-12-30 | Disposition: A | Discharge status: Home / Self Care | Payer: Self-pay | Source: Ambulatory Visit | Attending: Cardiology | Admitting: Cardiology

## 2018-01-01 ENCOUNTER — Encounter (HOSPITAL_COMMUNITY)
Admission: RE | Admit: 2018-01-01 | Discharge: 2018-01-01 | Disposition: A | Discharge status: Home / Self Care | Payer: Self-pay | Source: Ambulatory Visit | Attending: Cardiology | Admitting: Cardiology

## 2018-01-04 ENCOUNTER — Encounter (HOSPITAL_COMMUNITY): Payer: Self-pay

## 2018-01-06 ENCOUNTER — Encounter (HOSPITAL_COMMUNITY): Payer: Self-pay

## 2018-01-08 ENCOUNTER — Encounter (HOSPITAL_COMMUNITY): Payer: Self-pay

## 2018-01-11 ENCOUNTER — Encounter (HOSPITAL_COMMUNITY): Payer: Self-pay

## 2018-01-13 ENCOUNTER — Encounter (HOSPITAL_COMMUNITY)
Admission: RE | Admit: 2018-01-13 | Discharge: 2018-01-13 | Disposition: A | Discharge status: Home / Self Care | Payer: Medicare Other | Source: Ambulatory Visit | Attending: Cardiology | Admitting: Cardiology

## 2018-01-15 ENCOUNTER — Encounter (HOSPITAL_COMMUNITY): Payer: Self-pay

## 2018-01-18 ENCOUNTER — Encounter (HOSPITAL_COMMUNITY)
Admission: RE | Admit: 2018-01-18 | Discharge: 2018-01-18 | Disposition: A | Discharge status: Home / Self Care | Payer: Self-pay | Source: Ambulatory Visit | Attending: Cardiology | Admitting: Cardiology

## 2018-01-18 DIAGNOSIS — I2510 Atherosclerotic heart disease of native coronary artery without angina pectoris: Secondary | ICD-10-CM | POA: Insufficient documentation

## 2018-01-20 ENCOUNTER — Encounter (HOSPITAL_COMMUNITY)
Admission: RE | Admit: 2018-01-20 | Discharge: 2018-01-20 | Disposition: A | Discharge status: Home / Self Care | Payer: Self-pay | Source: Ambulatory Visit | Attending: Cardiology | Admitting: Cardiology

## 2018-01-22 ENCOUNTER — Encounter (HOSPITAL_COMMUNITY): Payer: Self-pay

## 2018-01-25 ENCOUNTER — Encounter (HOSPITAL_COMMUNITY)
Admission: RE | Admit: 2018-01-25 | Discharge: 2018-01-25 | Disposition: A | Discharge status: Home / Self Care | Payer: Self-pay | Source: Ambulatory Visit | Attending: Cardiology | Admitting: Cardiology

## 2018-01-27 ENCOUNTER — Encounter (HOSPITAL_COMMUNITY)
Admission: RE | Admit: 2018-01-27 | Discharge: 2018-01-27 | Disposition: A | Discharge status: Home / Self Care | Payer: Self-pay | Source: Ambulatory Visit | Attending: Cardiology | Admitting: Cardiology

## 2018-01-29 ENCOUNTER — Encounter (HOSPITAL_COMMUNITY): Payer: Self-pay

## 2018-02-01 ENCOUNTER — Encounter (HOSPITAL_COMMUNITY): Payer: Self-pay

## 2018-02-03 ENCOUNTER — Encounter (HOSPITAL_COMMUNITY): Payer: Self-pay

## 2018-02-05 ENCOUNTER — Encounter (HOSPITAL_COMMUNITY): Payer: Self-pay

## 2018-02-08 ENCOUNTER — Encounter (HOSPITAL_COMMUNITY): Payer: Self-pay

## 2018-02-10 ENCOUNTER — Encounter (HOSPITAL_COMMUNITY): Payer: Self-pay

## 2018-02-12 ENCOUNTER — Encounter (HOSPITAL_COMMUNITY): Payer: Self-pay

## 2018-02-15 ENCOUNTER — Encounter (HOSPITAL_COMMUNITY)
Admission: RE | Admit: 2018-02-15 | Discharge: 2018-02-15 | Disposition: A | Discharge status: Home / Self Care | Payer: Self-pay | Source: Ambulatory Visit | Attending: Cardiology | Admitting: Cardiology

## 2018-02-17 ENCOUNTER — Encounter (HOSPITAL_COMMUNITY)
Admission: RE | Admit: 2018-02-17 | Discharge: 2018-02-17 | Disposition: A | Discharge status: Home / Self Care | Payer: Self-pay | Source: Ambulatory Visit | Attending: Cardiology | Admitting: Cardiology

## 2018-02-19 ENCOUNTER — Encounter (HOSPITAL_COMMUNITY): Payer: Self-pay

## 2018-02-19 DIAGNOSIS — I2510 Atherosclerotic heart disease of native coronary artery without angina pectoris: Secondary | ICD-10-CM | POA: Insufficient documentation

## 2018-02-22 ENCOUNTER — Encounter (HOSPITAL_COMMUNITY): Payer: Self-pay

## 2018-02-24 ENCOUNTER — Encounter (HOSPITAL_COMMUNITY): Payer: Self-pay

## 2018-02-25 ENCOUNTER — Encounter: Payer: Self-pay | Admitting: Family Medicine

## 2018-02-25 ENCOUNTER — Ambulatory Visit: Payer: Medicare Other | Admitting: Family Medicine

## 2018-02-25 VITALS — BP 108/70 | HR 69 | Temp 97.7°F | Ht 65.5 in | Wt 190.4 lb

## 2018-02-25 DIAGNOSIS — N528 Other male erectile dysfunction: Secondary | ICD-10-CM

## 2018-02-25 DIAGNOSIS — G4733 Obstructive sleep apnea (adult) (pediatric): Secondary | ICD-10-CM

## 2018-02-25 DIAGNOSIS — Z0000 Encounter for general adult medical examination without abnormal findings: Secondary | ICD-10-CM

## 2018-02-25 DIAGNOSIS — Z87891 Personal history of nicotine dependence: Secondary | ICD-10-CM

## 2018-02-25 DIAGNOSIS — E785 Hyperlipidemia, unspecified: Secondary | ICD-10-CM

## 2018-02-25 DIAGNOSIS — N400 Enlarged prostate without lower urinary tract symptoms: Secondary | ICD-10-CM | POA: Diagnosis not present

## 2018-02-25 DIAGNOSIS — H9193 Unspecified hearing loss, bilateral: Secondary | ICD-10-CM

## 2018-02-25 DIAGNOSIS — K219 Gastro-esophageal reflux disease without esophagitis: Secondary | ICD-10-CM

## 2018-02-25 DIAGNOSIS — E8881 Metabolic syndrome: Secondary | ICD-10-CM

## 2018-02-25 DIAGNOSIS — M25512 Pain in left shoulder: Secondary | ICD-10-CM

## 2018-02-25 DIAGNOSIS — I2510 Atherosclerotic heart disease of native coronary artery without angina pectoris: Secondary | ICD-10-CM | POA: Diagnosis not present

## 2018-02-25 DIAGNOSIS — I5032 Chronic diastolic (congestive) heart failure: Secondary | ICD-10-CM

## 2018-02-25 LAB — CBC WITH DIFFERENTIAL/PLATELET
Basophils Absolute: 0 10*3/uL (ref 0.0–0.2)
Basos: 0 %
EOS (ABSOLUTE): 0.1 10*3/uL (ref 0.0–0.4)
Eos: 1 %
Hematocrit: 46.4 % (ref 37.5–51.0)
Hemoglobin: 15.5 g/dL (ref 13.0–17.7)
Immature Grans (Abs): 0 10*3/uL (ref 0.0–0.1)
Immature Granulocytes: 0 %
Lymphocytes Absolute: 1.7 10*3/uL (ref 0.7–3.1)
Lymphs: 21 %
MCH: 31.2 pg (ref 26.6–33.0)
MCHC: 33.4 g/dL (ref 31.5–35.7)
MCV: 93 fL (ref 79–97)
Monocytes Absolute: 0.5 10*3/uL (ref 0.1–0.9)
Monocytes: 6 %
Neutrophils Absolute: 6 10*3/uL (ref 1.4–7.0)
Neutrophils: 72 %
Platelets: 220 10*3/uL (ref 150–450)
RBC: 4.97 x10E6/uL (ref 4.14–5.80)
RDW: 13.4 % (ref 12.3–15.4)
WBC: 8.3 10*3/uL (ref 3.4–10.8)

## 2018-02-25 LAB — COMPREHENSIVE METABOLIC PANEL
ALT: 26 IU/L (ref 0–44)
AST: 23 IU/L (ref 0–40)
Albumin/Globulin Ratio: 1.8 (ref 1.2–2.2)
Albumin: 4.4 g/dL (ref 3.6–4.8)
Alkaline Phosphatase: 76 IU/L (ref 39–117)
BUN/Creatinine Ratio: 15 (ref 10–24)
BUN: 17 mg/dL (ref 8–27)
Bilirubin Total: 0.6 mg/dL (ref 0.0–1.2)
CO2: 25 mmol/L (ref 20–29)
Calcium: 9.6 mg/dL (ref 8.6–10.2)
Chloride: 99 mmol/L (ref 96–106)
Creatinine, Ser: 1.12 mg/dL (ref 0.76–1.27)
GFR calc Af Amer: 77 mL/min/{1.73_m2} (ref >59)
GFR calc non Af Amer: 67 mL/min/{1.73_m2} (ref >59)
Globulin, Total: 2.4 g/dL (ref 1.5–4.5)
Glucose: 112 mg/dL — ABNORMAL HIGH (ref 65–99)
Potassium: 4.3 mmol/L (ref 3.5–5.2)
Sodium: 139 mmol/L (ref 134–144)
Total Protein: 6.8 g/dL (ref 6.0–8.5)

## 2018-02-25 LAB — LIPID PANEL
Chol/HDL Ratio: 3.4 ratio (ref 0.0–5.0)
Cholesterol, Total: 130 mg/dL (ref 100–199)
HDL: 38 mg/dL — ABNORMAL LOW (ref >39)
LDL Calculated: 62 mg/dL (ref 0–99)
Triglycerides: 148 mg/dL (ref 0–149)
VLDL Cholesterol Cal: 30 mg/dL (ref 5–40)

## 2018-02-25 MED ORDER — METOPROLOL TARTRATE 25 MG PO TABS: 25.0000 mg | ORAL_TABLET | Freq: Two times a day (BID) | ORAL | 3 refills | Status: DC

## 2018-02-25 MED ORDER — ATORVASTATIN CALCIUM 80 MG PO TABS: ORAL_TABLET | ORAL | 3 refills | Status: DC

## 2018-02-25 MED ORDER — OMEPRAZOLE 40 MG PO CPDR: 40.0000 mg | DELAYED_RELEASE_CAPSULE | Freq: Every day | ORAL | 3 refills | Status: DC

## 2018-02-25 NOTE — Progress Notes (Signed)
Henry Rogers is a 67 y.o. male who presents for annual wellness visit and follow-up on chronic medical conditions.  He has the following concerns: He has been having left shoulder discomfort but no history of injury or overuse.  He notes decreased range of motion of his shoulder.  He is right-handed.  He continues on Cialis for treatment of his BPH and notes an improvement of his urinary symptoms.  He has not had much benefit though with erections.  He is a former smoker.  He continues to be followed by cardiology for his underlying cardiac disease.  In the past he has had CHF.  He is involved in a rehab program.  No present chest pain, PND or SOB. he does wear hearing aids.  He has had some difficulty recently with reflux but does note that certain foods do make this worse.  Presently he is taking Prilosec 40 mg and Zantac 75 at night.  He does use melatonin for sleep and does note occasional difficulty with that.  He does have OSA but is presently not using CPAP.  Also has a previous history of metabolic syndrome.   Immunizations and Health Maintenance Immunization History  Administered Date(s) Administered  . Influenza Split 05/24/2013  . Influenza, High Dose Seasonal PF 04/12/2014, 09/04/2015  . Influenza-Unspecified 04/07/2016, 04/01/2017, 06/01/2017  . Pneumococcal Conjugate-13 01/03/2015  . Pneumococcal Polysaccharide-23 01/16/2009  . Tdap 01/16/2009  . Zoster 01/16/2009  . Zoster Recombinat (Shingrix) 04/01/2017   Health Maintenance Due  Topic Date Due  . PNA vac Low Risk Adult (2 of 2 - PPSV23) 01/03/2016  . INFLUENZA VACCINE  02/18/2018    Last colonoscopy: two years at Dr. Elnoria Howard Last PSA: last year Dentist: 09-2017 Ophtho:08-2017 Exercise: cardiac rehab , walking   Other doctors caring for patient include:Jordan,Jacobs,Groat  Advanced Directives:yes  Copy asked for    Depression screen:  See questionnaire below.     Depression screen [facility] 2/9 02/25/2018 02/19/2017 01/11/2016  01/11/2016  Decreased Interest 0 0 0 0  Down, Depressed, Hopeless 0 0 0 0  PHQ - 2 Score 0 0 0 0  Some recent data might be hidden    Fall Screen: See Questionaire below.   Fall Risk  02/25/2018 02/19/2017 01/11/2016 01/11/2016 01/03/2015  Falls in the past year? No No No No No    ADL screen:  See questionnaire below.  Functional Status Survey: Is the patient deaf or have difficulty hearing?: No Does the patient have difficulty seeing, even when wearing glasses/contacts?: No Does the patient have difficulty concentrating, remembering, or making decisions?: No Does the patient have difficulty walking or climbing stairs?: No Does the patient have difficulty dressing or bathing?: No Does the patient have difficulty doing errands alone such as visiting a doctor's office or shopping?: No   Review of Systems  Constitutional: -, -unexpected weight change, -anorexia, -fatigue Allergy: -sneezing, -itching, -congestion Dermatology: denies changing moles, rash, lumps ENT: -runny nose, -ear pain, -sore throat,  Cardiology:  -chest pain, -palpitations, -orthopnea, Respiratory: -cough, -shortness of breath, -dyspnea on exertion, -wheezing,  Gastroenterology: -abdominal pain, -nausea, -vomiting, -diarrhea, -constipation, -dysphagia Hematology: -bleeding or bruising problems Musculoskeletal: -arthralgias, -myalgias, -joint swelling, -back pain, - Ophthalmology: -vision changes,  Urology: -dysuria, -difficulty urinating,  -urinary frequency, -urgency, incontinence Neurology: -, -numbness, , -memory loss, -falls, -dizziness    PHYSICAL EXAM:   General Appearance: Alert, cooperative, no distress, appears stated age Head: Normocephalic, without obvious abnormality, atraumatic Eyes: PERRL, conjunctiva/corneas clear, EOM's intact, fundi benign Ears: Normal  TM's and external ear canals Nose: Nares normal, mucosa normal, no drainage or sinus   tenderness Throat: Lips, mucosa, and tongue normal; teeth  and gums normal Neck: Supple, no lymphadenopathy, thyroid:no enlargement/tenderness/nodules; no carotid bruit or JVD Lungs: Clear to auscultation bilaterally without wheezes, rales or ronchi; respirations unlabored Heart: Regular rate and rhythm, S1 and S2 normal, no murmur, rub or gallop Abdomen: Soft, non-tender, nondistended, normoactive bowel sounds, no masses, no hepatosplenomegaly Extremities: No clubbing, cyanosis or edema.  Exam of the left shoulder does show some slight decreased range of motion of the shoulder.  Negative drop arm test.  Neer's and Hawkins test negative. Pulses: 2+ and symmetric all extremities Skin: Skin color, texture, turgor normal, no rashes or lesions Lymph nodes: Cervical, supraclavicular, and axillary nodes normal Neurologic: CNII-XII intact, normal strength, sensation and gait; reflexes 2+ and symmetric throughout   Psych: Normal mood, affect, hygiene and grooming  ASSESSMENT/PLAN: Routine general medical examination at a health care facility - Plan: CBC with Differential/Platelet, Comprehensive metabolic panel, Lipid panel  Coronary artery disease involving native coronary artery of native heart without angina pectoris - Plan: CBC with Differential/Platelet, Comprehensive metabolic panel, Lipid panel  Benign prostatic hyperplasia without lower urinary tract symptoms  Former smoker  Other male erectile dysfunction  Chronic diastolic CHF (congestive heart failure) (HCC)  Gastroesophageal reflux disease without esophagitis  Bilateral hearing loss, unspecified hearing loss type  Metabolic syndrome - Plan: CBC with Differential/Platelet, Comprehensive metabolic panel, Lipid panel  Hyperlipidemia LDL goal <70 - Plan: Lipid panel  OSA (obstructive sleep apnea)  Acute pain of left shoulder - Plan: DG Shoulder Left He will continue in cardiac rehab.  Discussed his sleep with him.  Recommend continuing to use the melatonin.  Did recommend taking 150 mg of  Zantac especially when he plans eat foods he knows will cause difficulty.  Discussed the shoulder pain.  Did demonstrate range of motion exercises that he can do for his shoulder.  Also discussed possibly referring him to PT if we need.  He was comfortable with all this.    Discussed PSA screening (risks/benefits), recommended at least 30 minutes of aerobic activity at least 5 days/week; proper sunscreen use reviewed; healthy diet and alcohol recommendations (less than or equal to 2 drinks/day) reviewed; regular seatbelt use; changing batteries in smoke detectors. Immunization recommendations discussed.  Colonoscopy recommendations reviewed.   Medicare Attestation I have personally reviewed: The patient's medical and social history Their use of alcohol, tobacco or illicit drugs Their current medications and supplements The patient's functional ability including ADLs,fall risks, home safety risks, cognitive, and hearing and visual impairment Diet and physical activities Evidence for depression or mood disorders  The patient's weight, height, and BMI have been recorded in the chart.  I have made referrals, counseling, and provided education to the patient based on review of the above and I have provided the patient with a written personalized care plan for preventive services.     Sharlot Gowda, MD   02/25/2018

## 2018-02-26 ENCOUNTER — Encounter (HOSPITAL_COMMUNITY)
Admission: RE | Admit: 2018-02-26 | Discharge: 2018-02-26 | Disposition: A | Discharge status: Home / Self Care | Payer: Self-pay | Source: Ambulatory Visit | Attending: Cardiology | Admitting: Cardiology

## 2018-03-01 ENCOUNTER — Encounter (HOSPITAL_COMMUNITY)
Admission: RE | Admit: 2018-03-01 | Discharge: 2018-03-01 | Disposition: A | Discharge status: Home / Self Care | Payer: Self-pay | Source: Ambulatory Visit | Attending: Cardiology | Admitting: Cardiology

## 2018-03-03 ENCOUNTER — Encounter (HOSPITAL_COMMUNITY)
Admission: RE | Admit: 2018-03-03 | Discharge: 2018-03-03 | Disposition: A | Discharge status: Home / Self Care | Payer: Self-pay | Source: Ambulatory Visit | Attending: Cardiology | Admitting: Cardiology

## 2018-03-05 ENCOUNTER — Encounter (HOSPITAL_COMMUNITY): Payer: Self-pay

## 2018-03-08 ENCOUNTER — Encounter (HOSPITAL_COMMUNITY)
Admission: RE | Admit: 2018-03-08 | Discharge: 2018-03-08 | Disposition: A | Discharge status: Home / Self Care | Payer: Self-pay | Source: Ambulatory Visit | Attending: Cardiology | Admitting: Cardiology

## 2018-03-10 ENCOUNTER — Encounter (HOSPITAL_COMMUNITY)
Admission: RE | Admit: 2018-03-10 | Discharge: 2018-03-10 | Disposition: A | Discharge status: Home / Self Care | Payer: Self-pay | Source: Ambulatory Visit | Attending: Cardiology | Admitting: Cardiology

## 2018-03-12 ENCOUNTER — Encounter (HOSPITAL_COMMUNITY)
Admission: RE | Admit: 2018-03-12 | Discharge: 2018-03-12 | Disposition: A | Discharge status: Home / Self Care | Payer: Medicare Other | Source: Ambulatory Visit | Attending: Cardiology | Admitting: Cardiology

## 2018-03-13 LAB — HGB A1C W/O EAG: Hgb A1c MFr Bld: 5.9 % — ABNORMAL HIGH (ref 4.8–5.6)

## 2018-03-13 LAB — SPECIMEN STATUS REPORT

## 2018-03-15 ENCOUNTER — Encounter (HOSPITAL_COMMUNITY)
Admission: RE | Admit: 2018-03-15 | Discharge: 2018-03-15 | Disposition: A | Discharge status: Home / Self Care | Payer: Medicare Other | Source: Ambulatory Visit | Attending: Cardiology | Admitting: Cardiology

## 2018-03-17 ENCOUNTER — Encounter (HOSPITAL_COMMUNITY)
Admission: RE | Admit: 2018-03-17 | Discharge: 2018-03-17 | Disposition: A | Discharge status: Home / Self Care | Payer: Medicare Other | Source: Ambulatory Visit | Attending: Cardiology | Admitting: Cardiology

## 2018-03-19 ENCOUNTER — Encounter (HOSPITAL_COMMUNITY): Payer: Self-pay

## 2018-03-24 ENCOUNTER — Encounter (HOSPITAL_COMMUNITY)
Admission: RE | Admit: 2018-03-24 | Discharge: 2018-03-24 | Disposition: A | Discharge status: Home / Self Care | Payer: Self-pay | Source: Ambulatory Visit | Attending: Cardiology | Admitting: Cardiology

## 2018-03-24 DIAGNOSIS — I2510 Atherosclerotic heart disease of native coronary artery without angina pectoris: Secondary | ICD-10-CM | POA: Insufficient documentation

## 2018-03-26 ENCOUNTER — Encounter (HOSPITAL_COMMUNITY)
Admission: RE | Admit: 2018-03-26 | Discharge: 2018-03-26 | Disposition: A | Discharge status: Home / Self Care | Payer: Self-pay | Source: Ambulatory Visit | Attending: Cardiology | Admitting: Cardiology

## 2018-03-29 ENCOUNTER — Encounter (HOSPITAL_COMMUNITY)
Admission: RE | Admit: 2018-03-29 | Discharge: 2018-03-29 | Disposition: A | Discharge status: Home / Self Care | Payer: Medicare Other | Source: Ambulatory Visit | Attending: Cardiology | Admitting: Cardiology

## 2018-03-31 ENCOUNTER — Encounter (HOSPITAL_COMMUNITY): Payer: Self-pay

## 2018-04-02 ENCOUNTER — Encounter (HOSPITAL_COMMUNITY): Payer: Self-pay

## 2018-04-02 ENCOUNTER — Other Ambulatory Visit: Payer: Self-pay

## 2018-04-02 DIAGNOSIS — I2510 Atherosclerotic heart disease of native coronary artery without angina pectoris: Secondary | ICD-10-CM

## 2018-04-02 MED ORDER — TICAGRELOR 60 MG PO TABS: 60.0000 mg | ORAL_TABLET | Freq: Two times a day (BID) | ORAL | 3 refills | Status: DC

## 2018-04-05 ENCOUNTER — Encounter (HOSPITAL_COMMUNITY)
Admission: RE | Admit: 2018-04-05 | Discharge: 2018-04-05 | Disposition: A | Discharge status: Home / Self Care | Payer: Self-pay | Source: Ambulatory Visit | Attending: Cardiology | Admitting: Cardiology

## 2018-04-05 ENCOUNTER — Ambulatory Visit: Payer: Medicare Other | Admitting: Family Medicine

## 2018-04-05 ENCOUNTER — Encounter: Payer: Self-pay | Admitting: Family Medicine

## 2018-04-05 ENCOUNTER — Telehealth: Payer: Self-pay | Admitting: Family Medicine

## 2018-04-05 VITALS — BP 108/64 | HR 71 | Temp 97.6°F | Wt 190.6 lb

## 2018-04-05 DIAGNOSIS — K219 Gastro-esophageal reflux disease without esophagitis: Secondary | ICD-10-CM

## 2018-04-05 NOTE — Progress Notes (Signed)
   Subjective:    Patient ID: Henry Rogers, male    DOB: 1951/01/07, 67 y.o.   MRN: [id number]  HPI He is here for consult concerning continued difficulty with reflux symptoms.  He does note that certain foods as well as wine can contribute to this.  The other day he had neither of them and was awakened in the middle of night with symptoms.  He presently is taking Prilosec and Zantac.  He states that he has had breakthroughs almost weekly with this.  He has not had an endoscopy.   Review of Systems     Objective:   Physical Exam Alert and in no distress.       Assessment & Plan:  Gastroesophageal reflux disease, esophagitis presence not specified - Plan: Ambulatory referral to Gastroenterology Since he is on regular dosing of Prilosec and Zantac and still having breakthroughs, I think GI evaluation for possible endoscopy is warranted.

## 2018-04-05 NOTE — Telephone Encounter (Signed)
error 

## 2018-04-07 ENCOUNTER — Encounter (HOSPITAL_COMMUNITY): Payer: Self-pay

## 2018-04-09 ENCOUNTER — Encounter (HOSPITAL_COMMUNITY): Payer: Self-pay

## 2018-04-12 ENCOUNTER — Encounter (HOSPITAL_COMMUNITY)
Admission: RE | Admit: 2018-04-12 | Discharge: 2018-04-12 | Disposition: A | Discharge status: Home / Self Care | Payer: Medicare Other | Source: Ambulatory Visit | Attending: Cardiology | Admitting: Cardiology

## 2018-04-14 ENCOUNTER — Encounter (HOSPITAL_COMMUNITY): Payer: Self-pay

## 2018-04-16 ENCOUNTER — Encounter (HOSPITAL_COMMUNITY)
Admission: RE | Admit: 2018-04-16 | Discharge: 2018-04-16 | Disposition: A | Discharge status: Home / Self Care | Payer: Self-pay | Source: Ambulatory Visit | Attending: Cardiology | Admitting: Cardiology

## 2018-04-19 ENCOUNTER — Encounter (HOSPITAL_COMMUNITY)
Admission: RE | Admit: 2018-04-19 | Discharge: 2018-04-19 | Disposition: A | Discharge status: Home / Self Care | Payer: Self-pay | Source: Ambulatory Visit | Attending: Cardiology | Admitting: Cardiology

## 2018-04-21 ENCOUNTER — Encounter (HOSPITAL_COMMUNITY)
Admission: RE | Admit: 2018-04-21 | Discharge: 2018-04-21 | Disposition: A | Discharge status: Home / Self Care | Payer: Self-pay | Source: Ambulatory Visit | Attending: Cardiology | Admitting: Cardiology

## 2018-04-21 DIAGNOSIS — I2510 Atherosclerotic heart disease of native coronary artery without angina pectoris: Secondary | ICD-10-CM | POA: Insufficient documentation

## 2018-04-23 ENCOUNTER — Encounter (HOSPITAL_COMMUNITY): Payer: Self-pay

## 2018-04-26 ENCOUNTER — Encounter (HOSPITAL_COMMUNITY)
Admission: RE | Admit: 2018-04-26 | Discharge: 2018-04-26 | Disposition: A | Discharge status: Home / Self Care | Payer: Self-pay | Source: Ambulatory Visit | Attending: Cardiology | Admitting: Cardiology

## 2018-04-27 ENCOUNTER — Encounter: Payer: Self-pay | Admitting: Physician Assistant

## 2018-04-27 ENCOUNTER — Telehealth: Payer: Self-pay | Admitting: *Deleted

## 2018-04-27 ENCOUNTER — Ambulatory Visit: Payer: Medicare Other | Admitting: Physician Assistant

## 2018-04-27 VITALS — BP 102/78 | HR 80 | Ht 65.5 in | Wt 197.0 lb

## 2018-04-27 DIAGNOSIS — K219 Gastro-esophageal reflux disease without esophagitis: Secondary | ICD-10-CM | POA: Diagnosis not present

## 2018-04-27 DIAGNOSIS — Z7901 Long term (current) use of anticoagulants: Secondary | ICD-10-CM | POA: Diagnosis not present

## 2018-04-27 MED ORDER — OMEPRAZOLE 20 MG PO CPDR: DELAYED_RELEASE_CAPSULE | ORAL | 8 refills | Status: DC

## 2018-04-27 NOTE — Patient Instructions (Addendum)
If you are age 69 or older, your body mass index should be between 23-30. Your Body mass index is 32.28 kg/m. If this is out of the aforementioned range listed, please consider follow up with your Primary Care Provider.  We will call you with the Brilinta instructions from Dr. Swaziland.   Continue Omeprazole 40 mg every morning. Add Omeprazole 20 mg before dinner. We sent a prescription to [facility], [address].   Do not eat 3 hours prior to bedtime. Elevated back 45 degrees to sleep.   You have been scheduled for an endoscopy. Please follow written instructions given to you at your visit today. If you use inhalers (even only as needed), please bring them with you on the day of your procedure. [name]

## 2018-04-27 NOTE — Telephone Encounter (Signed)
He is clear for colonoscopy and may hold Brilinta but not ASA for 7 days prior  Peter Swaziland MD, [facility]

## 2018-04-27 NOTE — Telephone Encounter (Signed)
[facility] Pre-operative Risk Assessment     Request for surgical clearance:     Endoscopy Procedure  What type of surgery is being performed?     Endoscopy  When is this surgery scheduled?     06-11-2018  What type of clearance is required ?   Pharmacy  Are there any medications that need to be held prior to surgery and how long?[place] name and name of physician performing surgery?      [facility]  What is your office phone and fax number?      Phone- [phone]  [phone]  Anesthesia type (None, local, MAC, general) ?       MAC

## 2018-04-27 NOTE — Progress Notes (Signed)
Subjective:    Patient ID: Henry Rogers, male    DOB: 12/14/1950, 67 y.o.   MRN: [id number]  HPI Henry Rogers is a pleasant 67 year old white male, known to Dr. Christella Rogers who was last seen in our office in March 2017.  He comes in today to discuss worsening GERD symptoms and EGD. He has history of chronic congestive heart failure, EF preserved, atrial fibrillation , coronary artery disease status post MI in July 2015 with cardiogenic shock and He did have stent placement at that time, and then had another stent placed in October 2016.  He has been maintained on baby aspirin and Brilinta since. He did have prior colonoscopy in 2016 per Dr. Jacques Rogers revealing sigmoid diverticulosis and had one tiny polyp which by biopsy was benign colonic tissue.  He is indicated for 10-year interval follow-up. He has been on PPI therapy long-term generally with omeprazole 40 mg daily.  When he had seen Dr. Christella Rogers in 2017 only a few months out from his most recent cardiac event and EGD was deferred at that time.    He says his PCP Henry Rogers had asked him several months ago to try to wean himself off of omeprazole.  He has been unable and says as soon as he misses a dose or 2 his symptoms immediately recur.  Over the past months he has been on omeprazole 40 mg every morning and ranitidine 150 mg p.o. nightly. His daytime symptoms are fairly well-controlled and he has no complaints of dysphagia or odynophagia.  No complaints of abdominal pain.  Most of his issues are nocturnal, he says he wakes up frequently with fluid in his mouth coughing and heartburn. Says he had one episode recently when he had rolled over onto his stomach at night and was awakened with fluid up in his mouth and then developed severe burning throughout his esophagus.    He says he tries to follow an antireflux regimen most of the time but does enjoy wine and [name]  He says he always knows on these nights he will have problems. He is sleeping on multiple pillows to try to elevate his back but says he often rolls off of these.  Review of Systems Pertinent positive and negative review of systems were noted in the above HPI section.  All other review of systems was otherwise negative.  Outpatient Encounter Medications as of 04/27/2018  Medication Sig  . aspirin 81 MG tablet Take 81 mg by mouth daily.  [name] atorvastatin (LIPITOR) 80 MG tablet TAKE 1 TABLET(80 MG) BY MOUTH DAILY 6 PM  . Melatonin 10 MG CAPS Take 10 mg by mouth at bedtime. Takes daily  . metoprolol tartrate (LOPRESSOR) 25 MG tablet Take 1 tablet (25 mg total) by mouth 2 (two) times daily.  . Multiple Vitamin (MULTIVITAMIN) tablet Take 1 tablet by mouth every evening.   . nitroGLYCERIN (NITROSTAT) 0.4 MG SL tablet Place 1 tablet (0.4 mg total) under the tongue every 5 (five) minutes as needed for chest pain. MAX 3 doses  . Nutritional Supplements (JUICE PLUS FIBRE PO) Take 1 tablet by mouth 2 (two) times daily. Fruit blend and vegetable blend  . omeprazole (PRILOSEC) 40 MG capsule Take 1 capsule (40 mg total) by mouth daily.  . ranitidine (ZANTAC) 75 MG tablet Take 75 mg by mouth at bedtime.   . tadalafil (CIALIS) 5 MG  tablet Take 1 tablet (5 mg total) by mouth at bedtime.  . ticagrelor (BRILINTA) 60 MG TABS tablet Take 1 tablet (60 mg total) by mouth 2 (two) times daily.  [name] zolpidem (AMBIEN) 5 MG tablet TAKE 1 TABLET BY MOUTH AT BEDTIME AS NEEDED FOR SLEEP  . omeprazole (PRILOSEC) 20 MG capsule Take 1 tablet by mouth before dinner daily.   No facility-administered encounter medications on file as of 04/27/2018.    Allergies  Allergen Reactions  . Ace Inhibitors     No personal hx reaction, but 2 family members have had persistent cough with the class and he prefers not to take ACEi.   Patient Active Problem List   Diagnosis Date Noted  . Old inferior wall myocardial  infarction 10/02/2016  . Former smoker 01/11/2016  . Chronic diastolic CHF (congestive heart failure) (HCC)   . PAF (paroxysmal atrial fibrillation) (HCC)   . CAD (coronary artery disease)   . OSA (obstructive sleep apnea)   . Varicose veins of leg with complications 08/22/2014  . Hyperlipidemia LDL goal <70 02/22/2014  . Metabolic syndrome 02/22/2014  . Hearing loss   . BPH (benign prostatic hyperplasia) 02/22/2013  . Actinic keratosis 02/22/2013  . ED (erectile dysfunction) 02/20/2012  . GERD (gastroesophageal reflux disease) 02/20/2012   Social History   Socioeconomic History  . Marital status: Married    Spouse name: Not on file  . Number of children: Not on file  . Years of education: Not on file  . Highest education level: Not on file  Occupational History  . Not on file  Social Needs  . Financial resource strain: Not on file  . Food insecurity:    Worry: Not on file    Inability: Not on file  . Transportation needs:    Medical: Not on file    Non-medical: Not on file  Tobacco Use  . Smoking status: Former Games developer  . Smokeless tobacco: Never Used  Substance and Sexual Activity  . Alcohol use: Yes    Alcohol/week: 3.0 standard drinks    Types: 3 Glasses of wine per week  . Drug use: No  . Sexual activity: Not Currently  Lifestyle  . Physical activity:    Days per week: Not on file    Minutes per session: Not on file  . Stress: Not on file  Relationships  . Social connections:    Talks on phone: Not on file    Gets together: Not on file    Attends religious service: Not on file    Active member of club or organization: Not on file    Attends meetings of clubs or organizations: Not on file    Relationship status: Not on file  . Intimate partner violence:    Fear of current or ex partner: Not on file    Emotionally abused: Not on file    Physically abused: Not on file    Forced sexual activity: Not on file  Other Topics Concern  . Not on file  Social  History Narrative  . Not on file    Henry Rogers family history includes Breast cancer in his mother; Cervical cancer in his mother; Hypertension in his mother; Skin cancer in his mother.      Objective:    Vitals:   04/27/18 1005  BP: 102/78  Pulse: 80    Physical Exam Well-developed older white male in no acute distress, pleasant blood pressure 102/78 pulse 80 height 5 foot 5, weight 197,  BMI 32.2.  HEENT ;nontraumatic normocephalic EOMI PERRLA sclera anicteric oral mucosa moist, Cardiovascular; regular rate and rhythm with S1-S2 no murmur rub or gallop, Pulmonary ;clear bilaterally, Abdomen; soft, nontender nondistended bowel sounds are active there is no palpable mass or hepatosplenomegaly.  Rectal; exam not done, Extremities; no clubbing cyanosis or edema skin warm dry, Neuro psych; alert and oriented, grossly nonfocal mood and affect appropriate      Assessment & Plan:   #34 67 year old white male with chronic GERD, on PPI therapy long-term, and currently on every morning omeprazole 40 and ranitidine 150 mg at bedtime with worsening nocturnal symptoms.  Patient has not had prior EGD, and should have surveillance for Barrett's.  #2 colon cancer surveillance-up-to-date with negative colonoscopy 2016 #3 diverticulosis #4 chronic anti-platelet/anticoagulation therapy with aspirin and Brilinta #5 coronary artery disease status post MI 2015 with cardiogenic shock, status post stent, patient had second stent placed in 2016 #6 history of atrial fibrillation   #7 sleep apnea  Plan; Patient is advised to tighten up on antireflux regimen including n.p.o. for 3 hours prior to bedtime and encouraged to elevate his back 45 degrees at night.  Consider getting a wedge pillow device or incline type bed.  Continue omeprazole 40 mg p.o. every morning AC breakfast, will discontinue ranitidine and add omeprazole 20 mg AC dinner  Patient will be scheduled for EGD with Dr. Christella Rogers.  Procedure was  discussed in detail with the patient including indications risks and benefits and he is agreeable to proceed.  Plan He will need to hold Brilinta for 5 days prior to the procedure, and will continue aspirin.  We will communicate with his cardiologist Dr. Swaziland to assure that this is reasonable for this patient.  Andree Heeg S Si Jachim PA-C 04/27/2018   Cc: Ronnald Nian, MD

## 2018-04-27 NOTE — Telephone Encounter (Signed)
[facility] Pre-operative Risk Assessment     Request for surgical clearance:     Endoscopy Procedure  What type of surgery is being performed?     Endoscopy  When is this surgery scheduled?     06-11-2018  What type of clearance is required ?   Pharmacy  Are there any medications that need to be held prior to surgery and how long? [place] name and name of physician performing surgery?      [facility]  What is your office phone and fax number?      Phone- [phone]  [phone]  Anesthesia type (None, local, MAC, general) ?       MAC

## 2018-04-28 ENCOUNTER — Telehealth: Payer: Self-pay

## 2018-04-28 ENCOUNTER — Encounter (HOSPITAL_COMMUNITY)
Admission: RE | Admit: 2018-04-28 | Discharge: 2018-04-28 | Disposition: A | Discharge status: Home / Self Care | Payer: Self-pay | Source: Ambulatory Visit | Attending: Cardiology | Admitting: Cardiology

## 2018-04-28 NOTE — Telephone Encounter (Signed)
Clearance faxed [facility] at fax # [phone].

## 2018-04-28 NOTE — Progress Notes (Signed)
I agree with the above note plan. Given lack of dysphagia it is very unlikely that he will have a stricture.  He does not need to come off his brillinta for the EGD.  Patty, Can you communicate this with the patient (no need to hold brilinta)

## 2018-04-28 NOTE — Telephone Encounter (Signed)
            I agree with the above note plan. Given lack of dysphagia it is very unlikely that he will have a stricture.  He does not need to come off his brillinta for the EGD.  Zakkery Dorian, Can you communicate this with the patient (no need to hold brilinta)     Left message on machine to call back

## 2018-04-28 NOTE — Telephone Encounter (Signed)
-----   Message from Rachael Fee, MD sent at 04/28/2018  7:16 AM EDT -----   ----- Message ----- From: Sammuel Cooper, PA-C Sent: 04/27/2018   2:59 PM EDT To: Rachael Fee, MD

## 2018-04-28 NOTE — Telephone Encounter (Signed)
Per Dr. Swaziland 04/27/18"  "He is clear for colonoscopy and may hold Brilinta but not ASA for 7 days prior"  Will route to requesting provider.

## 2018-04-29 NOTE — Telephone Encounter (Signed)
The pt was advised he can stay ON brilinta for EGD

## 2018-04-30 ENCOUNTER — Encounter (HOSPITAL_COMMUNITY)
Admission: RE | Admit: 2018-04-30 | Discharge: 2018-04-30 | Disposition: A | Discharge status: Home / Self Care | Payer: Self-pay | Source: Ambulatory Visit | Attending: Cardiology | Admitting: Cardiology

## 2018-05-03 ENCOUNTER — Encounter (HOSPITAL_COMMUNITY): Payer: Self-pay

## 2018-05-04 NOTE — Telephone Encounter (Signed)
On 04-29-2018,  Per Dr. Christella Hartigan, He told Hilma Favors RN that it is very unlikely that the patient will have a stricture. He does not need to come off the Brlinta for the EGD. Patty notified the patient.

## 2018-05-05 ENCOUNTER — Encounter (HOSPITAL_COMMUNITY): Payer: Self-pay

## 2018-05-06 ENCOUNTER — Telehealth: Payer: Self-pay | Admitting: Cardiology

## 2018-05-06 NOTE — Telephone Encounter (Signed)
Follow up: ° ° °Patient returning call back °

## 2018-05-06 NOTE — Telephone Encounter (Signed)
Returned call to patient no answer.LMTC. 

## 2018-05-07 ENCOUNTER — Encounter (HOSPITAL_COMMUNITY): Payer: Self-pay

## 2018-05-07 NOTE — Telephone Encounter (Signed)
Patient never returned call  

## 2018-05-10 ENCOUNTER — Encounter (HOSPITAL_COMMUNITY)
Admission: RE | Admit: 2018-05-10 | Discharge: 2018-05-10 | Disposition: A | Discharge status: Home / Self Care | Payer: Self-pay | Source: Ambulatory Visit | Attending: Cardiology | Admitting: Cardiology

## 2018-05-12 ENCOUNTER — Encounter (HOSPITAL_COMMUNITY)
Admission: RE | Admit: 2018-05-12 | Discharge: 2018-05-12 | Disposition: A | Discharge status: Home / Self Care | Payer: Self-pay | Source: Ambulatory Visit | Attending: Cardiology | Admitting: Cardiology

## 2018-05-14 ENCOUNTER — Encounter (HOSPITAL_COMMUNITY)
Admission: RE | Admit: 2018-05-14 | Discharge: 2018-05-14 | Disposition: A | Discharge status: Home / Self Care | Payer: Self-pay | Source: Ambulatory Visit | Attending: Cardiology | Admitting: Cardiology

## 2018-05-15 NOTE — Progress Notes (Signed)
Henry Rogers Date of Birth: 06/07/1951 Medical Record #[id number]  History of Present Illness: Henry Rogers is seen for follow up CAD. He is s/p inferior STEMI on 02/02/14. The Mid RCA was occluded with large thrombus load and a very ectatic vessel. He had cardiogenic shock. The RCA was stented with a 5.0x16 mm Veriflex stent post dilated to 6.0. EF was 45-50%. Other vessels had no significant disease. Repeat Echo in September 2015 showed that his LV function had normalized. EF 55-60%.  He was readmitted on May 06, 2015 with recurrent inferior infarct again associated with cardiogenic shock. The stent in the mid RCA was patent but the distal RCA was occluded with heavy thrombus. This was treated with POBA only with a 5.0 mm balloon.   Since then he has done very well on DAPT.    He continues to do [facility] three days a week and walks his dog daily. His major complaint today is of increased GERD symptoms which is clearly different than his cardiac pain. Planning to undergo upper EGD by Dr. Christella Hartigan. He does note he bruises easily on Brilinta.  Allergies as of 05/25/2018      Reactions   Ace Inhibitors    No personal hx reaction, but 2 family members have had persistent cough with the class and he prefers not to take ACEi.      Medication List        Accurate as of 05/25/18  9:33 AM. Always use your most recent med list.          aspirin 81 MG tablet Take 81 mg by mouth daily.   atorvastatin 80 MG tablet Commonly known as:  LIPITOR TAKE 1 TABLET(80 MG) BY MOUTH DAILY 6 PM   JUICE PLUS FIBRE PO Take 1 tablet by mouth 2 (two) times daily. Fruit blend and vegetable blend   Melatonin 10 MG Caps Take 10 mg by mouth at bedtime. Takes daily   metoprolol tartrate 25 MG tablet Commonly known as:  LOPRESSOR Take 1 tablet (25 mg total) by mouth 2 (two) times daily.   multivitamin tablet Take 1 tablet by mouth every evening.   nitroGLYCERIN 0.4  MG SL tablet Commonly known as:  NITROSTAT Place 1 tablet (0.4 mg total) under the tongue every 5 (five) minutes as needed for chest pain. MAX 3 doses   omeprazole 40 MG capsule Commonly known as:  PRILOSEC Take 1 capsule (40 mg total) by mouth daily.   omeprazole 20 MG capsule Commonly known as:  PRILOSEC Take 1 tablet by mouth before dinner daily.   tadalafil 5 MG tablet Commonly known as:  CIALIS Take 1 tablet (5 mg total) by mouth at bedtime.   ticagrelor 60 MG Tabs tablet Commonly known as:  BRILINTA Take 1 tablet (60 mg total) by mouth 2 (two) times daily.   zolpidem 5 MG tablet Commonly known as:  AMBIEN TAKE 1 TABLET BY MOUTH AT BEDTIME AS NEEDED FOR SLEEP       Allergies  Allergen Reactions  . Ace Inhibitors     No personal hx reaction, but 2 family members have had persistent cough with the class and he prefers not to take ACEi.    Past Medical History:  Diagnosis Date  . BPH (benign prostatic hyperplasia)   . CAD (coronary artery disease)    a. inf STEMI s/p BMS to mRCA (02/03/14)  b. inf STEMI s/p PCTA to RCA with slow reflow.  [name]  Chronic diastolic CHF (congestive heart failure) (HCC)    a. 2D echo on 05/18/15 LVEF 55-60%, akinesis basal inferior wall, G2DD   . GERD (gastroesophageal reflux disease)   . Hearing loss   . Hemorrhoids   . HH (hiatus hernia)   . Ischemic cardiomyopathy    a. ECHO 01/2014 EF 45-50% and akinesis of the entire inferior myocardium. Mild MR.   b. now resolved (EF 55-60% on 04/22/15)    . Metabolic syndrome   . OSA (obstructive sleep apnea)    a. mild not requiring CPAP at this time  . PAF (paroxysmal atrial fibrillation) (HCC)    a. brief episode after cardiac cath on 05/16/15- resolved after IV amiodarone.  . Tinnitus   . Varicose veins     Past Surgical History:  Procedure Laterality Date  . CARDIAC CATHETERIZATION N/A 05/16/2015   Procedure: Left Heart Cath and Coronary Angiography;  Surgeon: Lyn Records, MD;  Location:  [facility] INVASIVE CV LAB;  Service: Cardiovascular;  Laterality: N/A;  . CARDIAC CATHETERIZATION N/A 05/16/2015   Procedure: Coronary Stent Intervention;  Surgeon: Lyn Records, MD;  Location: [facility] INVASIVE CV LAB;  Service: Cardiovascular;  Laterality: N/A;  . CARPAL TUNNEL RELEASE Bilateral 03/02/2015   Procedure: BILATERAL CARPAL TUNNEL RELEASE ;  Surgeon: Cindee Salt, MD;  Location: [facility];  Service: Orthopedics;  Laterality: Bilateral;  . COLONOSCOPY  2006   Dr. Elnoria Howard  . CORONARY ANGIOPLASTY WITH STENT PLACEMENT  02/02/14   BMS to RCA  . ENDOVENOUS ABLATION SAPHENOUS VEIN W/ LASER Right 10-12-2014   EVLA right greater saphenous vein by Gretta Began MD  . LEFT HEART CATHETERIZATION WITH CORONARY ANGIOGRAM N/A 02/02/2014   Procedure: LEFT HEART CATHETERIZATION WITH CORONARY ANGIOGRAM;  Surgeon: Tamre Cass M Swaziland, MD;  Location: [facility] CATH LAB;  Service: Cardiovascular;  Laterality: N/A;  . stab phlebectomy  Right 03-15-2015   stab phlebectomy > 20 incisions (right leg) by Gretta Began MD    Social History   Socioeconomic History  . Marital status: Married    Spouse name: Not on file  . Number of children: Not on file  . Years of education: Not on file  . Highest education level: Not on file  Occupational History  . Not on file  Social Needs  . Financial resource strain: Not on file  . Food insecurity:    Worry: Not on file    Inability: Not on file  . Transportation needs:    Medical: Not on file    Non-medical: Not on file  Tobacco Use  . Smoking status: Former Games developer  . Smokeless tobacco: Never Used  Substance and Sexual Activity  . Alcohol use: Yes    Alcohol/week: 3.0 standard drinks    Types: 3 Glasses of wine per week  . Drug use: No  . Sexual activity: Not Currently  Lifestyle  . Physical activity:    Days per week: Not on file    Minutes per session: Not on file  . Stress: Not on file  Relationships  . Social connections:    Talks on phone: Not on file     Gets together: Not on file    Attends religious service: Not on file    Active member of club or organization: Not on file    Attends meetings of clubs or organizations: Not on file    Relationship status: Not on file  Other Topics Concern  . Not on file  Social History Narrative  .  Not on file    Family History  Problem Relation Age of Onset  . Hypertension Mother   . Cervical cancer Mother   . Breast cancer Mother   . Skin cancer Mother   . Colon cancer Neg Hx   . Esophageal cancer Neg Hx     Review of Systems: As noted in HPI.  All other systems were reviewed and are negative.  Physical Exam: BP 130/70   Pulse 63   Ht 5\' 6"  (1.676 m)   Wt 192 lb 6.4 oz (87.3 kg)   BMI 31.05 kg/m  Filed Weights   05/25/18 0902  Weight: 192 lb 6.4 oz (87.3 kg)  GENERAL:  Well appearing WM in NAD HEENT:  PERRL, EOMI, sclera are clear. Oropharynx is clear. NECK:  No jugular venous distention, carotid upstroke brisk and symmetric, no bruits, no thyromegaly or adenopathy LUNGS:  Clear to auscultation bilaterally CHEST:  Unremarkable HEART:  RRR,  PMI not displaced or sustained,S1 and S2 within normal limits, no S3, no S4: no clicks, no rubs, no murmurs ABD:  Soft, nontender. BS +, no masses or bruits. No hepatomegaly, no splenomegaly EXT:  2 + pulses throughout, no edema, no cyanosis no clubbing SKIN:  Warm and dry.  No rashes NEURO:  Alert and oriented x 3. Cranial nerves II through XII intact. PSYCH:  Cognitively intact        LABORATORY DATA: Lab Results  Component Value Date   WBC 8.3 02/25/2018   HGB 15.5 02/25/2018   HCT 46.4 02/25/2018   PLT 220 02/25/2018   GLUCOSE 112 (H) 02/25/2018   CHOL 130 02/25/2018   TRIG 148 02/25/2018   HDL 38 (L) 02/25/2018   LDLCALC 62 02/25/2018   ALT 26 02/25/2018   AST 23 02/25/2018   NA 139 02/25/2018   K 4.3 02/25/2018   CL 99 02/25/2018   CREATININE 1.12 02/25/2018   BUN 17 02/25/2018   CO2 25 02/25/2018   TSH 0.973  02/02/2014   PSA 0.4 02/19/2017   INR 1.03 02/02/2014   HGBA1C 5.9 (H) 02/25/2018    Ecg today shows NSR rate 63 with old inferior infarct. I have personally reviewed and interpreted this study.  Assessment / Plan: 1. CAD s/p inferior STEMI 02/02/14 with cardiogenic shock. S/p BMS of mid RCA. Very ectatic vessel. 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 Large ectatic vessel with a lot of thrombus. He remains asymptomatic.   Given recurrent infarction I would recommend DAPT indefinitely. Now on ASA 81 mg daily and Brilinta 60 mg bid.  Continue statin and metoprolol.  Follow up in 6 months.  2. Ischemic cardiomyopathy with EF 45-50%. Repeat Echo showed normal LV function in October 2016. ACEi stopped before due to hypotension. Continue low dose metoprolol.  3. Hyperlipidemia.   Continue high dose statin. LDL at goal < 70.   4. GERD planning EGD.

## 2018-05-17 ENCOUNTER — Encounter (HOSPITAL_COMMUNITY)
Admission: RE | Admit: 2018-05-17 | Discharge: 2018-05-17 | Disposition: A | Discharge status: Home / Self Care | Payer: Self-pay | Source: Ambulatory Visit | Attending: Cardiology | Admitting: Cardiology

## 2018-05-19 ENCOUNTER — Encounter (HOSPITAL_COMMUNITY)
Admission: RE | Admit: 2018-05-19 | Discharge: 2018-05-19 | Disposition: A | Discharge status: Home / Self Care | Payer: Self-pay | Source: Ambulatory Visit | Attending: Cardiology | Admitting: Cardiology

## 2018-05-21 ENCOUNTER — Encounter (HOSPITAL_COMMUNITY)
Admission: RE | Admit: 2018-05-21 | Discharge: 2018-05-21 | Disposition: A | Discharge status: Home / Self Care | Payer: Self-pay | Source: Ambulatory Visit | Attending: Cardiology | Admitting: Cardiology

## 2018-05-21 DIAGNOSIS — I2510 Atherosclerotic heart disease of native coronary artery without angina pectoris: Secondary | ICD-10-CM | POA: Insufficient documentation

## 2018-05-24 ENCOUNTER — Encounter (HOSPITAL_COMMUNITY)
Admission: RE | Admit: 2018-05-24 | Discharge: 2018-05-24 | Disposition: A | Discharge status: Home / Self Care | Payer: Self-pay | Source: Ambulatory Visit | Attending: Cardiology | Admitting: Cardiology

## 2018-05-25 ENCOUNTER — Encounter: Payer: Self-pay | Admitting: Cardiology

## 2018-05-25 ENCOUNTER — Ambulatory Visit: Payer: Medicare Other | Admitting: Cardiology

## 2018-05-25 VITALS — BP 130/70 | HR 63 | Ht 66.0 in | Wt 192.4 lb

## 2018-05-25 DIAGNOSIS — E785 Hyperlipidemia, unspecified: Secondary | ICD-10-CM | POA: Diagnosis not present

## 2018-05-25 DIAGNOSIS — I252 Old myocardial infarction: Secondary | ICD-10-CM | POA: Diagnosis not present

## 2018-05-25 DIAGNOSIS — I2510 Atherosclerotic heart disease of native coronary artery without angina pectoris: Secondary | ICD-10-CM

## 2018-05-25 NOTE — Patient Instructions (Signed)
Continue your current therapy  Follow up in 6 months   

## 2018-05-26 ENCOUNTER — Encounter (HOSPITAL_COMMUNITY)
Admission: RE | Admit: 2018-05-26 | Discharge: 2018-05-26 | Disposition: A | Discharge status: Home / Self Care | Payer: Self-pay | Source: Ambulatory Visit | Attending: Cardiology | Admitting: Cardiology

## 2018-05-28 ENCOUNTER — Encounter: Payer: Self-pay | Admitting: Gastroenterology

## 2018-05-28 ENCOUNTER — Encounter (HOSPITAL_COMMUNITY)
Admission: RE | Admit: 2018-05-28 | Discharge: 2018-05-28 | Disposition: A | Discharge status: Home / Self Care | Payer: Medicare Other | Source: Ambulatory Visit | Attending: Cardiology | Admitting: Cardiology

## 2018-05-31 ENCOUNTER — Encounter (HOSPITAL_COMMUNITY)
Admission: RE | Admit: 2018-05-31 | Discharge: 2018-05-31 | Disposition: A | Discharge status: Home / Self Care | Payer: Self-pay | Source: Ambulatory Visit | Attending: Cardiology | Admitting: Cardiology

## 2018-06-02 ENCOUNTER — Encounter (HOSPITAL_COMMUNITY): Payer: Self-pay

## 2018-06-04 ENCOUNTER — Encounter (HOSPITAL_COMMUNITY): Payer: Self-pay

## 2018-06-07 ENCOUNTER — Encounter (HOSPITAL_COMMUNITY): Payer: Self-pay

## 2018-06-08 ENCOUNTER — Encounter: Payer: Medicare Other | Admitting: Gastroenterology

## 2018-06-09 ENCOUNTER — Encounter (HOSPITAL_COMMUNITY): Payer: Self-pay

## 2018-06-11 ENCOUNTER — Encounter (HOSPITAL_COMMUNITY): Payer: Self-pay

## 2018-06-11 ENCOUNTER — Encounter: Payer: Medicare Other | Admitting: Gastroenterology

## 2018-06-14 ENCOUNTER — Encounter (HOSPITAL_COMMUNITY): Payer: Self-pay

## 2018-06-16 ENCOUNTER — Encounter (HOSPITAL_COMMUNITY): Payer: Self-pay

## 2018-06-21 ENCOUNTER — Encounter (HOSPITAL_COMMUNITY): Payer: Self-pay

## 2018-06-21 DIAGNOSIS — I2510 Atherosclerotic heart disease of native coronary artery without angina pectoris: Secondary | ICD-10-CM | POA: Insufficient documentation

## 2018-06-22 ENCOUNTER — Other Ambulatory Visit: Payer: Self-pay | Admitting: Family Medicine

## 2018-06-22 DIAGNOSIS — K219 Gastro-esophageal reflux disease without esophagitis: Secondary | ICD-10-CM

## 2018-06-23 ENCOUNTER — Encounter (HOSPITAL_COMMUNITY): Payer: Self-pay

## 2018-06-25 ENCOUNTER — Ambulatory Visit (AMBULATORY_SURGERY_CENTER): Payer: Medicare Other | Admitting: Gastroenterology

## 2018-06-25 ENCOUNTER — Encounter (HOSPITAL_COMMUNITY): Payer: Self-pay

## 2018-06-25 ENCOUNTER — Encounter: Payer: Self-pay | Admitting: Gastroenterology

## 2018-06-25 VITALS — BP 103/70 | HR 62 | Temp 97.2°F | Resp 15 | Ht 65.5 in | Wt 197.0 lb

## 2018-06-25 DIAGNOSIS — R12 Heartburn: Secondary | ICD-10-CM

## 2018-06-25 DIAGNOSIS — K219 Gastro-esophageal reflux disease without esophagitis: Secondary | ICD-10-CM

## 2018-06-25 MED ORDER — SODIUM CHLORIDE 0.9 % IV SOLN: 500.0000 mL | Freq: Once | INTRAVENOUS | Status: DC

## 2018-06-25 NOTE — Op Note (Signed)
[facility] Patient Name: Henry Rogers Procedure Date: 06/25/2018 8:55 AM MRN: [id number] Endoscopist: Rachael Fee , MD Age: 67 Referring MD:  Date of Birth: 16-Feb-1951 Gender: Male Account #: [id number] Procedure:                Upper GI endoscopy Indications:              Heartburn Medicines:                Monitored Anesthesia Care Procedure:                Pre-Anesthesia Assessment:                           - Prior to the procedure, a History and Physical                            was performed, and patient medications and                            allergies were reviewed. The patient's tolerance of                            previous anesthesia was also reviewed. The risks                            and benefits of the procedure and the sedation                            options and risks were discussed with the patient.                            All questions were answered, and informed consent                            was obtained. Prior Anticoagulants: The patient has                            taken Eliquis (brilinta), last dose was 1 day prior                            to procedure. ASA Grade Assessment: II - A patient                            with mild systemic disease. After reviewing the                            risks and benefits, the patient was deemed in                            satisfactory condition to undergo the procedure.                           After obtaining informed consent, the endoscope was  passed under direct vision. Throughout the                            procedure, the patient's blood pressure, pulse, and                            oxygen saturations were monitored continuously. The                            Endoscope was introduced through the mouth, and                            advanced to the second part of duodenum. The upper                            GI endoscopy was accomplished without  difficulty.                            The patient tolerated the procedure well. Scope In: Scope Out: Findings:                 Slightly irregular but non-nodular Z-line.                           The exam was otherwise without abnormality. Complications:            No immediate complications. Estimated blood loss:                            None. Estimated Blood Loss:     Estimated blood loss: none. Impression:               - Slightly irregular but non-nodular Z line.                           - Otherwise normal examination. Recommendation:           - Patient has a contact number available for                            emergencies. The signs and symptoms of potential                            delayed complications were discussed with the                            patient. Return to normal activities tomorrow.                            Written discharge instructions were provided to the                            patient.                           - Resume previous diet.                           -  Continue present medications. Continue prilosec                            (omeprazole) 40mg  shortly before breakfast and                            change the evening dose of 20mg  so that you take it                            shortly before dinner meal instead of at bedtime.                            Call Dr. Christella Hartigan' office in 3-4 weeks to report on                            your progress. Rachael Fee, MD 06/25/2018 9:06:59 AM This report has been signed electronically.

## 2018-06-25 NOTE — Progress Notes (Signed)
PT taken to PACU. Monitors in place. VSS. Report given to RN. 

## 2018-06-25 NOTE — Patient Instructions (Signed)
CONTINUE YOUR PRILOSEC(OMEPRAZOLE) 40 MG SHORTLY BEFORE BREAKFAST AND THEN CHANGE THE EVENING DOSE TO 20MG  PRIOR TO EVENING MEAL.   YOU HAD AN ENDOSCOPIC PROCEDURE TODAY AT THE [facility]:   Refer to the procedure report that was given to you for any specific questions about what was found during the examination.  If the procedure report does not answer your questions, please call your gastroenterologist to clarify.  If you requested that your care partner not be given the details of your procedure findings, then the procedure report has been included in a sealed envelope for you to review at your convenience later.  YOU SHOULD EXPECT: Some feelings of bloating in the abdomen. Passage of more gas than usual.  Walking can help get rid of the air that was put into your GI tract during the procedure and reduce the bloating. If you had a lower endoscopy (such as a colonoscopy or flexible sigmoidoscopy) you may notice spotting of blood in your stool or on the toilet paper. If you underwent a bowel prep for your procedure, you may not have a normal bowel movement for a few days.  Please Note:  You might notice some irritation and congestion in your nose or some drainage.  This is from the oxygen used during your procedure.  There is no need for concern and it should clear up in a day or so.  SYMPTOMS TO REPORT IMMEDIATELY:   Following upper endoscopy (EGD)  Vomiting of blood or coffee ground material  New chest pain or pain under the shoulder blades  Painful or persistently difficult swallowing  New shortness of breath  Fever of 100F or higher  Black, tarry-looking stools  For urgent or emergent issues, a gastroenterologist can be reached at any hour by calling [phone].   DIET:  We do recommend a small meal at first, but then you may proceed to your regular diet.  Drink plenty of fluids but you should avoid alcoholic beverages for 24 hours.  ACTIVITY:  You should plan to take  it easy for the rest of today and you should NOT DRIVE or use heavy machinery until tomorrow (because of the sedation medicines used during the test).    FOLLOW UP: Our staff will call the number listed on your records the next business day following your procedure to check on you and address any questions or concerns that you may have regarding the information given to you following your procedure. If we do not reach you, we will leave a message.  However, if you are feeling well and you are not experiencing any problems, there is no need to return our call.  We will assume that you have returned to your regular daily activities without incident.  If any biopsies were taken you will be contacted by phone or by letter within the next 1-3 weeks.  Please call us at [phone] if you have not heard about the biopsies in 3 weeks.    SIGNATURES/CONFIDENTIALITY: You and/or your care partner have signed paperwork which will be entered into your electronic medical record.  These signatures attest to the fact that that the information above on your After Visit Summary has been reviewed and is understood.  Full responsibility of the confidentiality of this discharge information lies with you and/or your care-partner.

## 2018-06-28 ENCOUNTER — Telehealth: Payer: Self-pay | Admitting: *Deleted

## 2018-06-28 ENCOUNTER — Encounter (HOSPITAL_COMMUNITY): Payer: Self-pay

## 2018-06-28 NOTE — Telephone Encounter (Signed)
First follow up call attempt.  Reached message with generic greeting.  No message left.

## 2018-06-28 NOTE — Telephone Encounter (Signed)
Second follow up call attempt.  Reached voicemail with no identifiers.  No message left. 

## 2018-06-30 ENCOUNTER — Encounter (HOSPITAL_COMMUNITY)
Admission: RE | Admit: 2018-06-30 | Discharge: 2018-06-30 | Disposition: A | Discharge status: Home / Self Care | Payer: Self-pay | Source: Ambulatory Visit | Attending: Cardiology | Admitting: Cardiology

## 2018-07-02 ENCOUNTER — Encounter (HOSPITAL_COMMUNITY): Payer: Self-pay

## 2018-07-05 ENCOUNTER — Encounter (HOSPITAL_COMMUNITY)
Admission: RE | Admit: 2018-07-05 | Discharge: 2018-07-05 | Disposition: A | Discharge status: Home / Self Care | Payer: Self-pay | Source: Ambulatory Visit | Attending: Cardiology | Admitting: Cardiology

## 2018-07-07 ENCOUNTER — Encounter (HOSPITAL_COMMUNITY): Payer: Self-pay

## 2018-07-09 ENCOUNTER — Encounter (HOSPITAL_COMMUNITY): Payer: Self-pay

## 2018-07-12 ENCOUNTER — Encounter (HOSPITAL_COMMUNITY): Payer: Self-pay

## 2018-07-16 ENCOUNTER — Encounter (HOSPITAL_COMMUNITY): Payer: Self-pay

## 2018-07-19 ENCOUNTER — Encounter (HOSPITAL_COMMUNITY)
Admission: RE | Admit: 2018-07-19 | Discharge: 2018-07-19 | Disposition: A | Discharge status: Home / Self Care | Payer: Self-pay | Source: Ambulatory Visit | Attending: Cardiology | Admitting: Cardiology

## 2018-07-23 ENCOUNTER — Encounter (HOSPITAL_COMMUNITY)
Admission: RE | Admit: 2018-07-23 | Discharge: 2018-07-23 | Disposition: A | Discharge status: Home / Self Care | Payer: Self-pay | Source: Ambulatory Visit | Attending: Cardiology | Admitting: Cardiology

## 2018-07-23 DIAGNOSIS — I2510 Atherosclerotic heart disease of native coronary artery without angina pectoris: Secondary | ICD-10-CM | POA: Insufficient documentation

## 2018-07-26 ENCOUNTER — Encounter (HOSPITAL_COMMUNITY)
Admission: RE | Admit: 2018-07-26 | Discharge: 2018-07-26 | Disposition: A | Discharge status: Home / Self Care | Payer: Self-pay | Source: Ambulatory Visit | Attending: Cardiology | Admitting: Cardiology

## 2018-07-28 ENCOUNTER — Encounter (HOSPITAL_COMMUNITY): Payer: Self-pay

## 2018-07-30 ENCOUNTER — Encounter (HOSPITAL_COMMUNITY)
Admission: RE | Admit: 2018-07-30 | Discharge: 2018-07-30 | Disposition: A | Discharge status: Home / Self Care | Payer: Self-pay | Source: Ambulatory Visit | Attending: Cardiology | Admitting: Cardiology

## 2018-08-02 ENCOUNTER — Encounter (HOSPITAL_COMMUNITY)
Admission: RE | Admit: 2018-08-02 | Discharge: 2018-08-02 | Disposition: A | Discharge status: Home / Self Care | Payer: Self-pay | Source: Ambulatory Visit | Attending: Cardiology | Admitting: Cardiology

## 2018-08-04 ENCOUNTER — Encounter (HOSPITAL_COMMUNITY): Payer: Self-pay

## 2018-08-06 ENCOUNTER — Encounter (HOSPITAL_COMMUNITY)
Admission: RE | Admit: 2018-08-06 | Discharge: 2018-08-06 | Disposition: A | Discharge status: Home / Self Care | Payer: Self-pay | Source: Ambulatory Visit | Attending: Cardiology | Admitting: Cardiology

## 2018-08-09 ENCOUNTER — Encounter (HOSPITAL_COMMUNITY)
Admission: RE | Admit: 2018-08-09 | Discharge: 2018-08-09 | Disposition: A | Discharge status: Home / Self Care | Payer: Self-pay | Source: Ambulatory Visit | Attending: Cardiology | Admitting: Cardiology

## 2018-08-11 ENCOUNTER — Encounter (HOSPITAL_COMMUNITY): Payer: Self-pay

## 2018-08-13 ENCOUNTER — Encounter (HOSPITAL_COMMUNITY)
Admission: RE | Admit: 2018-08-13 | Discharge: 2018-08-13 | Disposition: A | Discharge status: Home / Self Care | Payer: Self-pay | Source: Ambulatory Visit | Attending: Cardiology | Admitting: Cardiology

## 2018-08-16 ENCOUNTER — Encounter (HOSPITAL_COMMUNITY)
Admission: RE | Admit: 2018-08-16 | Discharge: 2018-08-16 | Disposition: A | Discharge status: Home / Self Care | Payer: Self-pay | Source: Ambulatory Visit | Attending: Cardiology | Admitting: Cardiology

## 2018-08-18 ENCOUNTER — Encounter (HOSPITAL_COMMUNITY)
Admission: RE | Admit: 2018-08-18 | Discharge: 2018-08-18 | Disposition: A | Discharge status: Home / Self Care | Payer: Self-pay | Source: Ambulatory Visit | Attending: Cardiology | Admitting: Cardiology

## 2018-08-20 ENCOUNTER — Encounter (HOSPITAL_COMMUNITY)
Admission: RE | Admit: 2018-08-20 | Discharge: 2018-08-20 | Disposition: A | Discharge status: Home / Self Care | Payer: Self-pay | Source: Ambulatory Visit | Attending: Cardiology | Admitting: Cardiology

## 2018-08-23 ENCOUNTER — Encounter (HOSPITAL_COMMUNITY)
Admission: RE | Admit: 2018-08-23 | Discharge: 2018-08-23 | Disposition: A | Discharge status: Home / Self Care | Payer: Self-pay | Source: Ambulatory Visit | Attending: Cardiology | Admitting: Cardiology

## 2018-08-23 DIAGNOSIS — I2510 Atherosclerotic heart disease of native coronary artery without angina pectoris: Secondary | ICD-10-CM | POA: Insufficient documentation

## 2018-08-25 ENCOUNTER — Encounter (HOSPITAL_COMMUNITY)
Admission: RE | Admit: 2018-08-25 | Discharge: 2018-08-25 | Disposition: A | Discharge status: Home / Self Care | Payer: Self-pay | Source: Ambulatory Visit | Attending: Cardiology | Admitting: Cardiology

## 2018-08-26 ENCOUNTER — Encounter (HOSPITAL_COMMUNITY)
Admission: RE | Admit: 2018-08-26 | Discharge: 2018-08-26 | Disposition: A | Discharge status: Home / Self Care | Payer: Self-pay | Source: Ambulatory Visit | Attending: Interventional Cardiology | Admitting: Interventional Cardiology

## 2018-08-27 ENCOUNTER — Encounter (HOSPITAL_COMMUNITY)
Admission: RE | Admit: 2018-08-27 | Discharge: 2018-08-27 | Disposition: A | Discharge status: Home / Self Care | Payer: Self-pay | Source: Ambulatory Visit | Attending: Cardiology | Admitting: Cardiology

## 2018-08-30 ENCOUNTER — Encounter (HOSPITAL_COMMUNITY)
Admission: RE | Admit: 2018-08-30 | Discharge: 2018-08-30 | Disposition: A | Discharge status: Home / Self Care | Payer: Self-pay | Source: Ambulatory Visit | Attending: Cardiology | Admitting: Cardiology

## 2018-09-01 ENCOUNTER — Encounter (HOSPITAL_COMMUNITY): Payer: Self-pay

## 2018-09-03 ENCOUNTER — Encounter (HOSPITAL_COMMUNITY): Payer: Self-pay

## 2018-09-06 ENCOUNTER — Encounter (HOSPITAL_COMMUNITY): Payer: Self-pay

## 2018-09-08 ENCOUNTER — Encounter (HOSPITAL_COMMUNITY): Payer: Self-pay

## 2018-09-10 ENCOUNTER — Encounter (HOSPITAL_COMMUNITY): Payer: Self-pay

## 2018-09-13 ENCOUNTER — Encounter (HOSPITAL_COMMUNITY): Payer: Self-pay

## 2018-09-13 ENCOUNTER — Other Ambulatory Visit: Payer: Self-pay | Admitting: Family Medicine

## 2018-09-13 DIAGNOSIS — K219 Gastro-esophageal reflux disease without esophagitis: Secondary | ICD-10-CM

## 2018-09-15 ENCOUNTER — Encounter (HOSPITAL_COMMUNITY): Payer: Self-pay

## 2018-09-17 ENCOUNTER — Encounter (HOSPITAL_COMMUNITY): Payer: Self-pay

## 2018-09-20 ENCOUNTER — Encounter (HOSPITAL_COMMUNITY): Payer: Self-pay | Attending: Cardiology

## 2018-09-20 DIAGNOSIS — I2510 Atherosclerotic heart disease of native coronary artery without angina pectoris: Secondary | ICD-10-CM | POA: Insufficient documentation

## 2018-09-22 ENCOUNTER — Encounter (HOSPITAL_COMMUNITY): Payer: Self-pay

## 2018-09-23 ENCOUNTER — Telehealth: Payer: Self-pay | Admitting: Medical

## 2018-09-23 ENCOUNTER — Other Ambulatory Visit: Payer: Self-pay | Admitting: Family Medicine

## 2018-09-23 ENCOUNTER — Other Ambulatory Visit: Payer: Self-pay | Admitting: Medical

## 2018-09-23 DIAGNOSIS — N400 Enlarged prostate without lower urinary tract symptoms: Secondary | ICD-10-CM

## 2018-09-23 DIAGNOSIS — G4733 Obstructive sleep apnea (adult) (pediatric): Secondary | ICD-10-CM

## 2018-09-23 MED ORDER — OSELTAMIVIR PHOSPHATE 75 MG PO CAPS: 75.0000 mg | ORAL_CAPSULE | Freq: Two times a day (BID) | ORAL | 0 refills | Status: DC

## 2018-09-23 NOTE — Progress Notes (Signed)
Henry Rogers

## 2018-09-23 NOTE — Telephone Encounter (Signed)
Is this okay to refill? 

## 2018-09-23 NOTE — Telephone Encounter (Signed)
Tamiflu called out for flu like symptoms.   I examined his wife today, Henry Rogers in our office who was + for FLu A with recent travel to [employer] in [place], [place].  No known exposure to Coronavirus COVID19 or recent travel to [place] or other hotbed area.

## 2018-09-24 ENCOUNTER — Encounter (HOSPITAL_COMMUNITY): Payer: Self-pay

## 2018-09-27 ENCOUNTER — Encounter (HOSPITAL_COMMUNITY): Payer: Self-pay

## 2018-09-29 ENCOUNTER — Encounter (HOSPITAL_COMMUNITY): Payer: Self-pay

## 2018-10-01 ENCOUNTER — Encounter (HOSPITAL_COMMUNITY): Payer: Self-pay

## 2018-10-04 ENCOUNTER — Encounter (HOSPITAL_COMMUNITY): Payer: Self-pay

## 2018-10-04 ENCOUNTER — Telehealth (HOSPITAL_COMMUNITY): Payer: Self-pay | Admitting: *Deleted

## 2018-10-04 NOTE — Telephone Encounter (Signed)
Contacted patient to notify of Cardiac Rehab department closure x2 weeks. Message left on patient's voicemail. Florina Glas, Exercise Physiologist Cardiac and Pulmonary Rehabilitaiton 

## 2018-10-06 ENCOUNTER — Encounter (HOSPITAL_COMMUNITY): Payer: Self-pay

## 2018-10-08 ENCOUNTER — Encounter (HOSPITAL_COMMUNITY): Payer: Self-pay

## 2018-10-11 ENCOUNTER — Encounter (HOSPITAL_COMMUNITY): Payer: Self-pay

## 2018-10-11 ENCOUNTER — Telehealth: Payer: Self-pay | Admitting: Family Medicine

## 2018-10-11 NOTE — Telephone Encounter (Signed)
Pt called that insurance needing P.A. for Tadalafil for BPH

## 2018-10-12 ENCOUNTER — Telehealth (HOSPITAL_COMMUNITY): Payer: Self-pay | Admitting: Family Medicine

## 2018-10-13 ENCOUNTER — Encounter (HOSPITAL_COMMUNITY): Payer: Self-pay

## 2018-10-15 ENCOUNTER — Encounter (HOSPITAL_COMMUNITY): Payer: Self-pay

## 2018-10-15 NOTE — Telephone Encounter (Signed)
P.A. CIALIS completed and approved for BPH til 07/21/19, pt informed, faxed pharmacy

## 2018-10-18 ENCOUNTER — Encounter (HOSPITAL_COMMUNITY): Payer: Self-pay

## 2018-10-20 ENCOUNTER — Encounter (HOSPITAL_COMMUNITY): Payer: Self-pay

## 2018-10-22 ENCOUNTER — Encounter (HOSPITAL_COMMUNITY): Payer: Self-pay

## 2018-10-25 ENCOUNTER — Encounter (HOSPITAL_COMMUNITY): Payer: Self-pay

## 2018-10-27 ENCOUNTER — Encounter (HOSPITAL_COMMUNITY): Payer: Self-pay

## 2018-10-28 ENCOUNTER — Telehealth (HOSPITAL_COMMUNITY): Payer: Self-pay | Admitting: Cardiac Rehabilitation

## 2018-10-28 NOTE — Telephone Encounter (Signed)
Pt phone call to inform of continued Outpatient Cardiac Rehab departmental closure for COVID 19 precautions. Future opening date to be determined. Pt instructed to continue exercising on his own following home exercise guidelines. advised to contact cardiology or PCP PRN symptoms, questions or concerns. Left message on answering machine.  Deveron Furlong, RN, BSN Cardiac Pulmonary Rehab

## 2018-10-29 ENCOUNTER — Encounter (HOSPITAL_COMMUNITY): Payer: Self-pay

## 2018-11-01 ENCOUNTER — Encounter (HOSPITAL_COMMUNITY): Payer: Self-pay

## 2018-11-03 ENCOUNTER — Encounter (HOSPITAL_COMMUNITY): Payer: Self-pay

## 2018-11-05 ENCOUNTER — Encounter (HOSPITAL_COMMUNITY): Payer: Self-pay

## 2018-11-08 ENCOUNTER — Encounter (HOSPITAL_COMMUNITY): Payer: Self-pay

## 2018-11-10 ENCOUNTER — Encounter (HOSPITAL_COMMUNITY): Payer: Self-pay

## 2018-11-12 ENCOUNTER — Encounter (HOSPITAL_COMMUNITY): Payer: Self-pay

## 2018-11-15 ENCOUNTER — Encounter (HOSPITAL_COMMUNITY): Payer: Self-pay

## 2018-11-17 ENCOUNTER — Encounter (HOSPITAL_COMMUNITY): Payer: Self-pay

## 2018-11-19 ENCOUNTER — Encounter (HOSPITAL_COMMUNITY): Payer: Self-pay

## 2018-11-22 ENCOUNTER — Encounter (HOSPITAL_COMMUNITY): Payer: Self-pay

## 2018-11-24 ENCOUNTER — Encounter (HOSPITAL_COMMUNITY): Payer: Self-pay

## 2018-11-25 ENCOUNTER — Telehealth: Payer: Self-pay | Admitting: Cardiology

## 2018-11-25 NOTE — Telephone Encounter (Signed)
LVM to call regarding video or phone visit.

## 2018-11-26 ENCOUNTER — Encounter (HOSPITAL_COMMUNITY): Payer: Self-pay

## 2018-11-26 ENCOUNTER — Telehealth: Payer: Self-pay | Admitting: Cardiology

## 2018-11-26 NOTE — Telephone Encounter (Signed)
Rescheduling 11-30-18 appt.

## 2018-11-26 NOTE — Telephone Encounter (Signed)
LVM regarding rescheduling of 11-29-18 appt with Dr. Swaziland to either [facility] or Dr. Jacques Navy.

## 2018-11-29 ENCOUNTER — Encounter (HOSPITAL_COMMUNITY): Payer: Self-pay

## 2018-11-30 ENCOUNTER — Telehealth: Payer: Medicare Other | Admitting: Cardiology

## 2018-12-01 ENCOUNTER — Encounter (HOSPITAL_COMMUNITY): Payer: Self-pay

## 2018-12-03 ENCOUNTER — Encounter (HOSPITAL_COMMUNITY): Payer: Self-pay

## 2018-12-06 ENCOUNTER — Encounter (HOSPITAL_COMMUNITY): Payer: Self-pay

## 2018-12-08 ENCOUNTER — Encounter (HOSPITAL_COMMUNITY): Payer: Self-pay

## 2018-12-08 ENCOUNTER — Telehealth: Payer: Self-pay | Admitting: Physician Assistant

## 2018-12-08 NOTE — Telephone Encounter (Signed)
Smartphone-- CALL CELL FOR VIRTUAL VISIT: [phone] pre-reg complete, mychart active, Verbal consent given 12/08/2018 MS

## 2018-12-10 ENCOUNTER — Encounter: Payer: Self-pay | Admitting: Physician Assistant

## 2018-12-10 ENCOUNTER — Telehealth (INDEPENDENT_AMBULATORY_CARE_PROVIDER_SITE_OTHER): Payer: Medicare Other | Admitting: Physician Assistant

## 2018-12-10 ENCOUNTER — Encounter (HOSPITAL_COMMUNITY): Payer: Self-pay

## 2018-12-10 VITALS — Ht 66.0 in | Wt 179.0 lb

## 2018-12-10 DIAGNOSIS — I5032 Chronic diastolic (congestive) heart failure: Secondary | ICD-10-CM

## 2018-12-10 DIAGNOSIS — I2510 Atherosclerotic heart disease of native coronary artery without angina pectoris: Secondary | ICD-10-CM

## 2018-12-10 DIAGNOSIS — E785 Hyperlipidemia, unspecified: Secondary | ICD-10-CM

## 2018-12-10 NOTE — Progress Notes (Signed)
Virtual Visit via Video Note   This visit type was conducted due to national recommendations for restrictions regarding the COVID-19 Pandemic (e.g. social distancing) in an effort to limit this patient's exposure and mitigate transmission in our community.  Due to his co-morbid illnesses, this patient is at least at moderate risk for complications without adequate follow up.  This format is felt to be most appropriate for this patient at this time.  All issues noted in this document were discussed and addressed.  A limited physical exam was performed with this format.  Please refer to the patient's chart for his consent to telehealth for [facility].   Date:  12/10/2018   ID:  [name], DOB Dec 17, 1950, MRN [id number]  Patient Location: Home Provider Location: Home  PCP:  Ronnald Nian, MD  Cardiologist:  Peter Swaziland, MD  Electrophysiologist:  None   Evaluation Performed:  Follow-Up Visit  Chief Complaint:  followup  History of Present Illness:    Henry Rogers is a 68 y.o. male with past medical history of HLD, obstructive sleep apnea, chronic diastolic heart failure and CAD.  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  He also had a cardiogenic shock.  RCA was stented with a 5 x 16 mm Veriflex stent postdilated to 6 mm.  EF was 45 to 50%.  The other coronary vessels did not have significant disease.  Repeat echocardiogram in September 2015 showed LV function was normalized to 55 to 60%.  He was readmitted in October 2016 with recurrent inferior infarction again associated with cardiogenic shock.  The stent in the mid RCA was patent however distal RCA was occluded was heavy thrombus.  This was treated with balloon angioplasty.  He had a transient episode of paroxysmal atrial fibrillation after the cardiac catheterization, this was resolved after IV amiodarone and has not recurred since.  He is not on any  systemic anticoagulation due to the brief episodes without recurrence.  He was last seen by Dr. Swaziland on 05/25/2018, at which time he was doing well.   Patient was contacted via doximity video conference visit.  He denies any exertional chest discomfort or shortness of breath.  Prior to this day of home order, he was going to the cardiac rehab 3 times a week.  Since cardiac rehab closed in early March, he has been walking with his wife and the dog roughly 25 to 30 minutes on a daily basis.  He has not experienced any exertional chest discomfort or shortness of breath.  He does not have any lower extremity edema, orthopnea or PND.  Unfortunately he was unable to check his blood pressure at home as he does not have a blood pressure cuff.  He denies any dizziness, blurred vision feeling of passing out.  Overall I think he is doing quite well from cardiology perspective and can follow-up with Dr. Swaziland in 6 months.  His PCP is planning to do annual physical in August and will obtain lipid panel at that time.  The patient does not have symptoms concerning for COVID-19 infection (fever, chills, cough, or new shortness of breath).    Past Medical History:  Diagnosis Date   BPH (benign prostatic hyperplasia)    CAD (coronary artery disease)    a. inf STEMI s/p BMS to mRCA (02/03/14)  b. inf STEMI s/p PCTA to RCA with slow reflow.   Chronic diastolic CHF (congestive heart failure) (  HCC)    a. 2D echo on 05/18/15 LVEF 55-60%, akinesis basal inferior wall, G2DD    GERD (gastroesophageal reflux disease)    Hearing loss    Hemorrhoids    HH (hiatus hernia)    Ischemic cardiomyopathy    a. ECHO 01/2014 EF 45-50% and akinesis of the entire inferior myocardium. Mild MR.   b. now resolved (EF 55-60% on 04/22/15)     Metabolic syndrome    OSA (obstructive sleep apnea)    a. mild not requiring CPAP at this time   PAF (paroxysmal atrial fibrillation) (HCC)    a. brief episode after cardiac cath on  05/16/15- resolved after IV amiodarone.   Tinnitus    Varicose veins    Past Surgical History:  Procedure Laterality Date   CARDIAC CATHETERIZATION N/A 05/16/2015   Procedure: Left Heart Cath and Coronary Angiography;  Surgeon: Lyn Records, MD;  Location: [facility] INVASIVE CV LAB;  Service: Cardiovascular;  Laterality: N/A;   CARDIAC CATHETERIZATION N/A 05/16/2015   Procedure: Coronary Stent Intervention;  Surgeon: Lyn Records, MD;  Location: [facility] INVASIVE CV LAB;  Service: Cardiovascular;  Laterality: N/A;   CARPAL TUNNEL RELEASE Bilateral 03/02/2015   Procedure: BILATERAL CARPAL TUNNEL RELEASE ;  Surgeon: Cindee Salt, MD;  Location: [facility];  Service: Orthopedics;  Laterality: Bilateral;   COLONOSCOPY  2006   Dr. Elnoria Howard   CORONARY ANGIOPLASTY WITH STENT PLACEMENT  02/02/14   BMS to RCA   ENDOVENOUS ABLATION SAPHENOUS VEIN W/ LASER Right 10-12-2014   EVLA right greater saphenous vein by Gretta Began MD   LEFT HEART CATHETERIZATION WITH CORONARY ANGIOGRAM N/A 02/02/2014   Procedure: LEFT HEART CATHETERIZATION WITH CORONARY ANGIOGRAM;  Surgeon: Peter M Swaziland, MD;  Location: [facility] C[facility] LAB;  Service: Cardiovascular;  Laterality: N/A;   stab phlebectomy  Right 03-15-2015   stab phlebectomy > 20 incisions (right leg) by Gretta Began MD     Current Meds  Medication Sig   aspirin 81 MG tablet Take 81 mg by mouth daily.   atorvastatin (LIPITOR) 80 MG tablet TAKE 1 TABLET(80 MG) BY MOUTH DAILY 6 PM   Melatonin 10 MG CAPS Take 10 mg by mouth at bedtime. Takes daily   metoprolol tartrate (LOPRESSOR) 25 MG tablet Take 1 tablet (25 mg total) by mouth 2 (two) times daily.   Multiple Vitamin (MULTIVITAMIN) tablet Take 1 tablet by mouth every evening.    Nutritional Supplements (JUICE PLUS FIBRE PO) Take 1 tablet by mouth 2 (two) times daily. Fruit blend and vegetable blend   omeprazole (PRILOSEC) 20 MG capsule Take 1 tablet by mouth before dinner daily.   omeprazole (PRILOSEC)  40 MG capsule TAKE 1 CAPSULE(40 MG) BY MOUTH DAILY   tadalafil (CIALIS) 5 MG tablet TAKE 1 TABLET(5 MG) BY MOUTH DAILY AS NEEDED FOR ERECTILE DYSFUNCTION (Patient taking differently: Take 5 mg by mouth. Daily for bladder control)   ticagrelor (BRILINTA) 60 MG TABS tablet Take 1 tablet (60 mg total) by mouth 2 (two) times daily.   zolpidem (AMBIEN) 5 MG tablet TAKE 1 TABLET BY MOUTH AT BEDTIME AS NEEDED FOR SLEEP     Allergies:   Ace inhibitors   Social History   Tobacco Use   Smoking status: Former Smoker   Smokeless tobacco: Never Used  Substance Use Topics   Alcohol use: Yes    Alcohol/week: 3.0 standard drinks    Types: 3 Glasses of wine per week   Drug use: No  Family Hx: The patient's family history includes Breast cancer in his mother; Cervical cancer in his mother; Hypertension in his mother; Skin cancer in his mother. There is no history of Colon cancer or Esophageal cancer.  ROS:   Please see the history of present illness.     All other systems reviewed and are negative.   Prior CV studies:   The following studies were reviewed today:  Cath 05/16/2015 1. Mid RCA to Dist RCA lesion, 10% stenosed. The lesion was previously treated with a stent (unknown type). 2. Prox RCA to Mid RCA lesion, 25% stenosed. 3. Prox LAD to Dist LAD lesion, 40% stenosed. 4. Dist RCA lesion, 100% stenosed. Post intervention, there is a 10% residual stenosis.    Acute inferior myocardial infarction presenting with cardiogenic shock.  PTCA of the distal RCA from 100% to less than 10%. The vessel is large and ectatic containing heavy thrombus. Reperfusion was complicated by severe "no reflow".  Widely patent left coronary system.  Intra-aortic balloon pump to improve diastolic flow and help resolve "no reflow".  Left ventriculography was not performed   RECOMMENDATIONS:   One-to-one intra-aortic balloon, pulsation for at least 24 hours.  Intravenous Levophed to keep a  mean arterial pressure greater than 65 mmHg.  Intravenous Aggrastat for at least 24 hours  Aspirin and Brilinta indefinitely.  IV amiodarone to treat acute atrial fibrillation.  Prognosis is guarded. This was discussed with the patient's family in full detail.  IV heparin without a bolus to start in 8 hours.  Beta blocker therapy as tolerated by blood pressure.  2-D Doppler echocardiogram 24-48 hours hence.  Unfortunately, the patient will probably have a rocky post infarct course with potential for brady- and ventricular arrhythmias.   Labs/Other Tests and Data Reviewed:    EKG:  An ECG dated 05/26/2018 was personally reviewed today and demonstrated:  Normal sinus rhythm with inferior Q waves  Recent Labs: 02/25/2018: ALT 26; BUN 17; Creatinine, Ser 1.12; Hemoglobin 15.5; Platelets 220; Potassium 4.3; Sodium 139   Recent Lipid Panel Lab Results  Component Value Date/Time   CHOL 130 02/25/2018 09:23 AM   TRIG 148 02/25/2018 09:23 AM   HDL 38 (L) 02/25/2018 09:23 AM   CHOLHDL 3.4 02/25/2018 09:23 AM   CHOLHDL 2.9 02/19/2017 08:43 AM   LDLCALC 62 02/25/2018 09:23 AM    Wt Readings from Last 3 Encounters:  12/10/18 179 lb (81.2 kg)  06/25/18 197 lb (89.4 kg)  05/25/18 192 lb 6.4 oz (87.3 kg)     Objective:    Vital Signs:  Ht 5\' 6"  (1.676 m)    Wt 179 lb (81.2 kg)    BMI 28.89 kg/m    VITAL SIGNS:  reviewed    ASSESSMENT & PLAN:    1. CAD: Denies any recent chest discomfort.  On aspirin and Lipitor  2. Hyperlipidemia: LDL goal less than 70.  Due to have repeat fasting lipid panel by primary care provider.  3. Chronic diastolic heart failure: No recent sign of heart failure.  Denies any lower extremity edema, orthopnea or PND.  COVID-19 Education: The signs and symptoms of COVID-19 were discussed with the patient and how to seek care for testing (follow up with PCP or arrange E-visit).  The importance of social distancing was discussed today.  Time:   Today, I  have spent 8 minutes with the patient with telehealth technology discussing the above problems.     Medication Adjustments/Labs and Tests Ordered: Current medicines are reviewed  at length with the patient today.  Concerns regarding medicines are outlined above.   Tests Ordered: No orders of the defined types were placed in this encounter.   Medication Changes: No orders of the defined types were placed in this encounter.   Disposition:  Follow up in 6 month(s)  Signed, Azalee Course, PA  12/10/2018 1:20 PM    [facility]

## 2018-12-10 NOTE — Patient Instructions (Signed)
Medication Instructions:   Your physician recommends that you continue on your current medications as directed. Please refer to the Current Medication list given to you today.  If you need a refill on your cardiac medications before your next appointment, please call your pharmacy.   Lab work:  NONE ordered at this time of appointment   If you have labs (blood work) drawn today and your tests are completely normal, you will receive your results only by: . MyChart Message (if you have MyChart) OR . A paper copy in the mail If you have any lab test that is abnormal or we need to change your treatment, we will call you to review the results.  Testing/Procedures:  NONE ordered at this time of appointment   Follow-Up: At [facility], you and your health needs are our priority.  As part of our continuing mission to provide you with exceptional heart care, we have created designated Provider Care Teams.  These Care Teams include your primary Cardiologist (physician) and Advanced Practice Providers (APPs -  Physician Assistants and Nurse Practitioners) who all work together to provide you with the care you need, when you need it. You will need a follow up appointment in 6 months.  Please call our office 2 months in advance to schedule this appointment.  You may see Peter Jordan, MD or one of the following Advanced Practice Providers on your designated Care Team: Hao Meng, PA-C . Angela Duke, PA-C  Any Other Special Instructions Will Be Listed Below (If Applicable).    

## 2018-12-15 ENCOUNTER — Encounter (HOSPITAL_COMMUNITY): Payer: Self-pay

## 2018-12-15 ENCOUNTER — Other Ambulatory Visit: Payer: Self-pay | Admitting: Physician Assistant

## 2018-12-17 ENCOUNTER — Encounter (HOSPITAL_COMMUNITY): Payer: Self-pay

## 2018-12-20 ENCOUNTER — Encounter (HOSPITAL_COMMUNITY): Payer: Medicare Other

## 2018-12-22 ENCOUNTER — Encounter (HOSPITAL_COMMUNITY): Payer: Medicare Other

## 2018-12-24 ENCOUNTER — Encounter (HOSPITAL_COMMUNITY): Payer: Medicare Other

## 2018-12-27 ENCOUNTER — Encounter (HOSPITAL_COMMUNITY): Payer: Medicare Other

## 2018-12-29 ENCOUNTER — Encounter (HOSPITAL_COMMUNITY): Payer: Medicare Other

## 2018-12-31 ENCOUNTER — Encounter (HOSPITAL_COMMUNITY): Payer: Medicare Other

## 2019-01-03 ENCOUNTER — Encounter (HOSPITAL_COMMUNITY): Payer: Medicare Other

## 2019-01-05 ENCOUNTER — Encounter (HOSPITAL_COMMUNITY): Payer: Medicare Other

## 2019-01-07 ENCOUNTER — Encounter (HOSPITAL_COMMUNITY): Payer: Medicare Other

## 2019-01-10 ENCOUNTER — Encounter (HOSPITAL_COMMUNITY): Payer: Medicare Other

## 2019-01-12 ENCOUNTER — Encounter (HOSPITAL_COMMUNITY): Payer: Medicare Other

## 2019-01-14 ENCOUNTER — Encounter (HOSPITAL_COMMUNITY): Payer: Medicare Other

## 2019-01-17 ENCOUNTER — Encounter (HOSPITAL_COMMUNITY): Admission: RE | Admit: 2019-01-17 | Payer: Medicare Other | Source: Ambulatory Visit

## 2019-01-17 DIAGNOSIS — I2510 Atherosclerotic heart disease of native coronary artery without angina pectoris: Secondary | ICD-10-CM | POA: Insufficient documentation

## 2019-01-18 ENCOUNTER — Telehealth (HOSPITAL_COMMUNITY): Payer: Self-pay | Admitting: *Deleted

## 2019-01-18 NOTE — Telephone Encounter (Signed)
Called to update patient on the status of the maintenance Cardiac Rehab exercise classes.  At this time they remain to be on hold due to the COVID-19 pandemic precautions.

## 2019-01-25 ENCOUNTER — Ambulatory Visit: Payer: Medicare Other | Admitting: Family Medicine

## 2019-01-25 ENCOUNTER — Other Ambulatory Visit: Payer: Self-pay

## 2019-01-25 ENCOUNTER — Encounter: Payer: Self-pay | Admitting: Family Medicine

## 2019-01-25 VITALS — BP 116/74 | HR 82 | Temp 97.6°F | Wt 191.8 lb

## 2019-01-25 DIAGNOSIS — M7652 Patellar tendinitis, left knee: Secondary | ICD-10-CM

## 2019-01-25 NOTE — Patient Instructions (Signed)
Heat for 20 minutes 3 times per day.  4 ibuprofen 3 times per day.  Do this for the next 7 to 10 days

## 2019-01-25 NOTE — Progress Notes (Signed)
   Subjective:    Patient ID: Henry Rogers, male    DOB: 10/02/1950, 68 y.o.   MRN: [id number]  HPI He did some gardening work with a lot of things on his knees about 6 days ago and the next day noticed some anterior knee pain and also some swelling.  He notes occasional referral of pain laterally to the mid calf area.  No popping, locking or grinding.  He has been taking 2 Advil 3 times per day and getting some minimal relief of his symptoms.   Review of Systems     Objective:   Physical Exam Alert and in no distress.  Effusion is noted.  No tenderness to palpation over the joint line.  Slight tenderness over the patellar tendon.  McMurray's testing medially was slightly uncomfortable but lateral testing was negative.  Negative anterior drawer.      Assessment & Plan:   Encounter Diagnosis  Name Primary?  . Patellar tendinitis of left knee Yes  He is to use heat for 20 minutes 3 times per day as well as increase his ibuprofen to 800 g 3 times daily.  If he continues have trouble, I will reevaluate and look for other causes

## 2019-02-07 ENCOUNTER — Telehealth: Payer: Self-pay | Admitting: Family Medicine

## 2019-02-07 DIAGNOSIS — M25562 Pain in left knee: Secondary | ICD-10-CM

## 2019-02-07 NOTE — Telephone Encounter (Signed)
Pt called and states he has not improved. He states he has been following all recommendations. Please advise pt at [phone].

## 2019-02-07 NOTE — Telephone Encounter (Signed)
appt made for 02-08-19

## 2019-02-07 NOTE — Telephone Encounter (Signed)
Set him up for an appointment to be seen tomorrow and let him know that I ordered an x-ray that he can get before he comes here

## 2019-02-08 ENCOUNTER — Encounter: Payer: Self-pay | Admitting: Family Medicine

## 2019-02-08 ENCOUNTER — Ambulatory Visit
Admission: RE | Admit: 2019-02-08 | Discharge: 2019-02-08 | Disposition: A | Discharge status: Home / Self Care | Payer: Medicare Other | Source: Ambulatory Visit | Attending: Family Medicine | Admitting: Family Medicine

## 2019-02-08 ENCOUNTER — Telehealth: Payer: Self-pay | Admitting: Family Medicine

## 2019-02-08 ENCOUNTER — Ambulatory Visit: Payer: Medicare Other | Admitting: Family Medicine

## 2019-02-08 ENCOUNTER — Other Ambulatory Visit: Payer: Self-pay

## 2019-02-08 VITALS — BP 124/80 | HR 65 | Temp 97.7°F | Wt 195.8 lb

## 2019-02-08 DIAGNOSIS — M25512 Pain in left shoulder: Secondary | ICD-10-CM

## 2019-02-08 DIAGNOSIS — M25562 Pain in left knee: Secondary | ICD-10-CM | POA: Diagnosis not present

## 2019-02-08 NOTE — Telephone Encounter (Signed)
Order was added. [place]

## 2019-02-08 NOTE — Telephone Encounter (Signed)
Betsy at [facility] called and said pt wants xray on shoulder but the order for that has expired. Pt is there now having xray on his knee. Gwinda Passe direct number is [phone]

## 2019-02-08 NOTE — Progress Notes (Signed)
   Subjective:    Patient ID: Henry Rogers, male    DOB: 14-Mar-1951, 68 y.o.   MRN: [id number]  HPI He is here for evaluation of 2 issues.  He continues have difficulty with left shoulder discomfort especially with internal rotation as well as abduction and external rotation.  This only bothers him when he is active extremes of these motions.  He has had no popping or locking.  No feeling of instability. He also is still having some minimal discomfort with his left knee.  He does feel that the effusion has diminished and he is doing better but wants to have this checked again.   Review of Systems     Objective:   Physical Exam Full range of motion of the left shoulder.  Negative drop arm test.  Neer's and Hawkins test was negative.  No sulcus sign seen.  Left knee exam shows minimal effusion with only slight tenderness palpation over the inferior pole of the patella.    Assessment & Plan:   Encounter Diagnoses  Name Primary?  . Acute pain of left shoulder Yes  . Acute pain of left knee   I discussed treatment with this and definitely recommended range of motion exercises for the shoulder.  He will do this on his own.  I then discussed the knee discomfort and since this is getting better no further intervention is necessary.

## 2019-02-28 NOTE — Progress Notes (Deleted)
Subjective:    Henry Rogers is a 68 y.o. male who presents for Medicare Annual Wellness Visit. His concerns today include: _____   He has a pmh of MI, CHF, PAF, and CAD that is managed with aspirin daily, metoprolol daily, nitroglycerine _____, ticogrelor daily  He has a pmh of ED that is managed with taladafil as needed, _______  Zolpidem (as needed), Obstructive sleep apnea  Hyperlipidemia  Varicose veins  GERD, omegprazole  Actinic keratosis  Hearing loss     Names of Other Physician/Practitioners you currently use: 1. Sharlot Gowda, MD here for primary care 2. ***, eye doctor, last visit *** 3. ***, dentist, last visit *** 4. ***, ***   Medical Services you may have received from other than [facility] providers in the past year (date may be approximate) ***  Preventative care: Last colonoscopy: ***  Prior vaccinations: TD or Tdap: ***  Influenza: ***  Pneumococcal: *** Shingles/Zostavax: ***  History reviewed: {history reviewed:20406::"allergies","current medications","past family history","past medical history","past social history","past surgical history","problem list"}  Current Problems (verified) Patient Active Problem List   Diagnosis Date Noted  . Old inferior wall myocardial infarction 10/02/2016  . Former smoker 01/11/2016  . Chronic diastolic CHF (congestive heart failure) (HCC)   . PAF (paroxysmal atrial fibrillation) (HCC)   . CAD (coronary artery disease)   . OSA (obstructive sleep apnea)   . Varicose veins of leg with complications 08/22/2014  . Hyperlipidemia LDL goal <70 02/22/2014  . Metabolic syndrome 02/22/2014  . Hearing loss   . BPH (benign prostatic hyperplasia) 02/22/2013  . Actinic keratosis 02/22/2013  . ED (erectile dysfunction) 02/20/2012  . GERD (gastroesophageal reflux disease) 02/20/2012    *** Immunization History  Administered Date(s) Administered  . Influenza Split 05/24/2013  . Influenza, High Dose Seasonal PF  04/12/2014, 09/04/2015  . Influenza-Unspecified 04/07/2016, 04/01/2017, 06/01/2017, 07/10/2018  . Pneumococcal Conjugate-13 01/03/2015  . Pneumococcal Polysaccharide-23 01/16/2009  . Tdap 01/16/2009  . Zoster 01/16/2009  . Zoster Recombinat (Shingrix) 04/01/2017    Risk Factors: Tobacco Social History   Tobacco Use  Smoking Status Former Smoker  Smokeless Tobacco Never Used   male {does/does not:19097} smoke.  Patient is/is not a former smoker. Are there smokers in your home (other than you)?  {yes/no:20286}  Alcohol Current alcohol use: ***  Caffeine Current caffeine use: ***  Exercise Current exercise habits: {exercise:19826}  Current exercise: {exercise types:16438}  Nutrition/Diet Current diet: {diet habits:16563}  Cardiac risk factors: {risk factors:510}.  Depression Screen Nurse depression screen reviewed.  (Note: if answer to either of the following is "Yes", a more complete depression screening is indicated)   Q1: Over the past two weeks, have you felt down, depressed or hopeless? {yes/no:20286}  Q2: Over the past two weeks, have you felt little interest or pleasure in doing things? {yes/no:20286}  Have you lost interest or pleasure in daily life? {yes/no:20286}  Do you often feel hopeless? {yes/no:20286}  Do you cry easily over simple problems? {yes/no:20286}  Activities of Daily Living Nurse ADLs screen reviewed.  In your present state of health, do you have any difficulty performing the following activities?:  Driving? {yes/no:20286} Managing money?  {Responses; yes/no (default no):140031::"No"} Feeding yourself? {yes/no:20286} Getting from bed to chair? {yes/no:20286} Climbing a flight of stairs? {yes/no:20286} Preparing food and eating?: {yes/no (default no):140031::"No"} Bathing or showering? {Responses; yes/no (default no):140031::"No"} Getting dressed: {yes/no (default no):140031::"No"} Getting to the toilet? {Responses; yes/no (default  no):140031::"No"} Using the toilet:{yes/no (default no):140031::"No"} Moving around from place to place: {yes/no (default  no):140031::"No"} In the past year have you fallen or had a near fall?:{yes/no (default no):140031::"No"}   Are you sexually active?  {yes/no:20286}  Do you have more than one partner?  {Responses; yes/no (default no):140031::"No"}  Vision Difficulties: {yes/no (default no):140031::"No"}  Hearing Difficulties: {yes/no (default no):140031::"No"} Do you often ask people to speak up or repeat themselves? {Responses; yes/no (default no):140031::"No"} Do you experience ringing or noises in your ears? {Responses; yes/no (default no):140031::"No"} Do you have difficulty understanding soft or whispered voices? {Responses; yes/no (default no):140031::"No"}  Cognition  Do you feel that you have a problem with memory? {yes/no:20286}  Do you often misplace items? {yes/no:20286}  Do you feel safe at home?  {yes/no:20286}  Advanced directives Does patient have a Health Care Power of Attorney? {yes/no:20286} Does patient have a Living Will? {yes/no:20286}  Screening Tests Health Maintenance  Topic Date Due  . PNA vac Low Risk Adult (2 of 2 - PPSV23) 01/03/2016  . INFLUENZA VACCINE  02/19/2019  . COLONOSCOPY  03/28/2025  . TETANUS/TDAP  07/10/2028  . Hepatitis C Screening  Completed      Objective:     Vision and hearing screens reviewed.   There were no vitals taken for this visit. There is no height or weight on file to calculate BMI.  General appearance: alert, no distress, WD/WN, *** male Cognitive Testing  Alert? {yes/no:20286}  Normal Appearance?{yes/no:20286}  Oriented to person? {yes/no:20286}  Place? {yes/no:20286}   Time? {yes/no:20286}  Recall of three objects?  {yes/no:20286}  Can perform simple calculations? {yes/no:20286}  Displays appropriate judgment?{yes/no:20286}  Can read the correct time from a watch face?{yes/no:20286}  *** Physical Exam   Assessment:     Plan:   During the course of the visit the patient was educated and counseled about appropriate screening and preventive services including:    {plan:19836}  Screening recommendations, referrals:  Vaccinations: Tdap vaccine *** Influenza vaccine *** Pneumococcal vaccine *** Shingles vaccine *** Hep B vaccine ***  Nutrition assessed and recommended *** Colonoscopy *** Recommended yearly ophthalmology/optometry visit for glaucoma screening and checkup Recommended yearly dental visit for hygiene and checkup Advanced directives - ***  Conditions/risks identified: ***  Medicare Attestation I have personally reviewed: The patient's medical and social history Their use of alcohol, tobacco or illicit drugs Their current medications and supplements The patient's functional ability including ADLs,fall risks, home safety risks, cognitive, and hearing and visual impairment Diet and physical activities Evidence for depression or mood disorders  The patient's weight, height, BMI, and visual acuity have been recorded in the chart.  I have made referrals, counseling, and provided education to the patient based on review of the above and I have provided the patient with a written personalized care plan for preventive services.     Jill Alexanders, MD   02/28/2019

## 2019-03-01 ENCOUNTER — Encounter: Payer: Self-pay | Admitting: Family Medicine

## 2019-03-01 ENCOUNTER — Telehealth: Payer: Self-pay | Admitting: Family Medicine

## 2019-03-01 ENCOUNTER — Ambulatory Visit: Payer: Medicare Other | Admitting: Family Medicine

## 2019-03-01 ENCOUNTER — Other Ambulatory Visit: Payer: Self-pay

## 2019-03-01 VITALS — BP 112/78 | HR 73 | Temp 98.7°F | Ht 66.5 in | Wt 196.2 lb

## 2019-03-01 DIAGNOSIS — E785 Hyperlipidemia, unspecified: Secondary | ICD-10-CM | POA: Diagnosis not present

## 2019-03-01 DIAGNOSIS — E8881 Metabolic syndrome: Secondary | ICD-10-CM

## 2019-03-01 DIAGNOSIS — L570 Actinic keratosis: Secondary | ICD-10-CM

## 2019-03-01 DIAGNOSIS — I83899 Varicose veins of unspecified lower extremities with other complications: Secondary | ICD-10-CM

## 2019-03-01 DIAGNOSIS — H9193 Unspecified hearing loss, bilateral: Secondary | ICD-10-CM | POA: Diagnosis not present

## 2019-03-01 DIAGNOSIS — I5032 Chronic diastolic (congestive) heart failure: Secondary | ICD-10-CM

## 2019-03-01 DIAGNOSIS — N400 Enlarged prostate without lower urinary tract symptoms: Secondary | ICD-10-CM

## 2019-03-01 DIAGNOSIS — G4733 Obstructive sleep apnea (adult) (pediatric): Secondary | ICD-10-CM

## 2019-03-01 DIAGNOSIS — K219 Gastro-esophageal reflux disease without esophagitis: Secondary | ICD-10-CM | POA: Diagnosis not present

## 2019-03-01 DIAGNOSIS — N528 Other male erectile dysfunction: Secondary | ICD-10-CM

## 2019-03-01 DIAGNOSIS — I2510 Atherosclerotic heart disease of native coronary artery without angina pectoris: Secondary | ICD-10-CM

## 2019-03-01 DIAGNOSIS — I252 Old myocardial infarction: Secondary | ICD-10-CM

## 2019-03-01 MED ORDER — TADALAFIL 5 MG PO TABS: 5.0000 mg | ORAL_TABLET | Freq: Every day | ORAL | 3 refills | Status: DC

## 2019-03-01 MED ORDER — OMEPRAZOLE 40 MG PO CPDR: DELAYED_RELEASE_CAPSULE | ORAL | 3 refills | Status: DC

## 2019-03-01 MED ORDER — ATORVASTATIN CALCIUM 80 MG PO TABS: ORAL_TABLET | ORAL | 3 refills | Status: DC

## 2019-03-01 MED ORDER — METOPROLOL TARTRATE 25 MG PO TABS: 25.0000 mg | ORAL_TABLET | Freq: Two times a day (BID) | ORAL | 3 refills | Status: DC

## 2019-03-01 NOTE — Telephone Encounter (Signed)
Pt called and said he forgot to ask you this morning should he continue taking the Ibuprofen 2 in the am and 2 in the pm for his knee? Pt said he is still having pain

## 2019-03-01 NOTE — Telephone Encounter (Signed)
Yes.If still having trouble in 2 weeks,hav hi call me

## 2019-03-01 NOTE — Patient Instructions (Signed)
  [name] , Thank you for taking time to come for your Medicare Wellness Visit. I appreciate your ongoing commitment to your health goals. Please review the following plan we discussed and let me know if I can assist you in the future.   These are the goals we discussed: Goals   None     This is a list of the screening recommended for you and due dates:  Health Maintenance  Topic Date Due  . Flu Shot  02/19/2019  . Colon Cancer Screening  03/28/2025  . Tetanus Vaccine  07/10/2028  .  Hepatitis C: One time screening is recommended by Center for Disease Control  (CDC) for  adults born from [birth year] through 1967.   Completed  . Pneumonia vaccines  Discontinued

## 2019-03-01 NOTE — Progress Notes (Signed)
Henry Rogers is a 68 y.o. male who presents for annual wellness visit and follow-up on chronic medical conditions.  He has underlying GERD and is treating this with Prilosec sometimes twice per day.  He does note that cutting back on his food late at night does help.  He does have underlying BPH and finds that Cialis helps with this but has not helped with his erectile dysfunction.  He does have OSA but is not on CPAP.  He continues to be followed by cardiology for his underlying heart disease.  He continues to wear his hearing aid but did lose 1 of them.  He is seen by dermatology regularly.  Has a previous history of metabolic syndrome.  His varicose bags are giving him very little difficulty.   Immunizations and Health Maintenance Immunization History  Administered Date(s) Administered  . Influenza Split 05/24/2013  . Influenza, High Dose Seasonal PF 04/12/2014, 09/04/2015  . Influenza-Unspecified 04/07/2016, 04/01/2017, 06/01/2017, 07/10/2018  . Pneumococcal Conjugate-13 01/03/2015  . Pneumococcal Polysaccharide-23 01/16/2009  . Tdap 01/16/2009  . Zoster 01/16/2009  . Zoster Recombinat (Shingrix) 04/01/2017   Health Maintenance Due  Topic Date Due  . INFLUENZA VACCINE  02/19/2019    Last colonoscopy:03/29/2015 Last PSA:02/19/17 Dentist: today  Ophtho:07/2017 Exercise:walking 5 days a week  Other doctors caring for patient include:Henry Rogers cardio, Henry Rogers and [facility]  Advanced Directives: Yes on file     Depression screen:  See questionnaire below.     Depression screen [facility] 2/9 03/01/2019 02/25/2018 02/19/2017  Decreased Interest 0 0 0  Down, Depressed, Hopeless 0 0 0  PHQ - 2 Score 0 0 0  Some recent data might be hidden    Fall Screen: See Questionaire below.   Fall Risk  03/01/2019 02/25/2018 02/19/2017 01/11/2016 01/11/2016  Falls in the past year? 0 No No No No    ADL screen:  See questionnaire below.  Functional Status Survey: Is the patient deaf or have difficulty  hearing?: Yes Does the patient have difficulty seeing, even when wearing glasses/contacts?: No Does the patient have difficulty concentrating, remembering, or making decisions?: No Does the patient have difficulty walking or climbing stairs?: No Does the patient have difficulty dressing or bathing?: No Does the patient have difficulty doing errands alone such as visiting a doctor's office or shopping?: No   Review of Systems  Constitutional: -, -unexpected weight change, -anorexia, -fatigue Allergy: -sneezing, -itching, -congestion Dermatology: denies changing moles, rash, lumps ENT: -runny nose, -ear pain, -sore throat,  Cardiology:  -chest pain, -palpitations, -orthopnea, Respiratory: -cough, -shortness of breath, -dyspnea on exertion, -wheezing,  Gastroenterology: -abdominal pain, -nausea, -vomiting, -diarrhea, -constipation, -dysphagia Hematology: -bleeding or bruising problems Musculoskeletal: -arthralgias, -myalgias, -joint swelling, -back pain, - Ophthalmology: -vision changes,  Urology: -dysuria, -difficulty urinating,  -urinary frequency, -urgency, incontinence Neurology: -, -numbness, , -memory loss, -falls, -dizziness    PHYSICAL EXAM:  BP 112/78 (BP Location: Left Arm, Patient Position: Sitting)   Pulse 73   Temp 98.7 F (37.1 C)   Ht 5' 6.5" (1.689 m)   Wt 196 lb 3.2 oz (89 kg)   SpO2 97%   BMI 31.19 kg/m   General Appearance: Alert, cooperative, no distress, appears stated age Head: Normocephalic, without obvious abnormality, atraumatic Eyes: PERRL, conjunctiva/corneas clear, EOM's intact, fundi benign Ears: Normal TM's and external ear canals Nose: Nares normal, mucosa normal, no drainage or sinus   tenderness Throat: Lips, mucosa, and tongue normal; teeth and gums normal Neck: Supple, no lymphadenopathy, thyroid:no enlargement/tenderness/nodules;  no carotid bruit or JVD Lungs: Clear to auscultation bilaterally without wheezes, rales or ronchi; respirations  unlabored Heart: Regular rate and rhythm, S1 and S2 normal, no murmur, rub or gallop Abdomen: Soft, non-tender, nondistended, normoactive bowel sounds, no masses, no hepatosplenomegaly Extremities: No clubbing, cyanosis or edema Pulses: 2+ and symmetric all extremities Skin: Skin color, texture, turgor normal, no rashes or lesions Lymph nodes: Cervical, supraclavicular, and axillary nodes normal Neurologic: CNII-XII intact, normal strength, sensation and gait; reflexes 2+ and symmetric throughout   Psych: Normal mood, affect, hygiene and grooming  ASSESSMENT/PLAN: Gastroesophageal reflux disease, esophagitis presence not specified - Plan: Continue on Prilosec  Benign prostatic hyperplasia without lower urinary tract symptoms - Plan: tadalafil (CIALIS) 5 MG tablet,   Bilateral hearing loss, unspecified hearing loss type - Plan: He will continue to wear his hearing aid  Hyperlipidemia LDL goal <70 - Plan: Lipid panel, atorvastatin (LIPITOR) 80 MG tablet,   Metabolic syndrome - Plan: CBC with Differential/Platelet, Comprehensive metabolic panel,   Chronic diastolic CHF (congestive heart failure) (HCC) - Plan: CBC with Differential/Platelet, Comprehensive metabolic panel, Lipid panel,   Coronary artery disease involving native coronary artery of native heart without angina pectoris - Plan: CBC with Differential/Platelet, Comprehensive metabolic panel, Lipid panel, metoprolol tartrate (LOPRESSOR) 25 MG tablet,   Other male erectile dysfunction - Plan: He is not interested in pursuing any further intervention.  Actinic keratosis - Plan: Follow-up with dermatology  Varicose veins of leg with complications - Plan: Not having difficulty with that.  Old inferior wall myocardial infarction -   OSA (obstructive sleep apnea) - Plan: tadalafil (CIALIS) 5 MG tablet,   Gastroesophageal reflux disease without esophagitis - Plan: omeprazole (PRILOSEC) 40 MG capsule,     Medicare Attesta \  have  personally reviewed: The patient's medical and social history Their use of alcohol, tobacco or illicit drugs Their current medications and supplements The patient's functional ability including ADLs,fall risks, home safety risks, cognitive, and hearing and visual impairment Diet and physical activities Evidence for depression or mood disorders  The patient's weight, height, and BMI have been recorded in the chart.  I have made referrals, counseling, and provided education to the patient based on review of the above and I have provided the patient with a written personalized care plan for preventive services.     Jill Alexanders, MD   03/01/2019

## 2019-03-02 LAB — COMPREHENSIVE METABOLIC PANEL
ALT: 26 IU/L (ref 0–44)
AST: 24 IU/L (ref 0–40)
Albumin/Globulin Ratio: 2.2 (ref 1.2–2.2)
Albumin: 4.4 g/dL (ref 3.8–4.8)
Alkaline Phosphatase: 73 IU/L (ref 39–117)
BUN/Creatinine Ratio: 15 (ref 10–24)
BUN: 16 mg/dL (ref 8–27)
Bilirubin Total: 0.7 mg/dL (ref 0.0–1.2)
CO2: 23 mmol/L (ref 20–29)
Calcium: 9.4 mg/dL (ref 8.6–10.2)
Chloride: 100 mmol/L (ref 96–106)
Creatinine, Ser: 1.05 mg/dL (ref 0.76–1.27)
GFR calc Af Amer: 83 mL/min/{1.73_m2} (ref >59)
GFR calc non Af Amer: 72 mL/min/{1.73_m2} (ref >59)
Globulin, Total: 2 g/dL (ref 1.5–4.5)
Glucose: 105 mg/dL — ABNORMAL HIGH (ref 65–99)
Potassium: 4.6 mmol/L (ref 3.5–5.2)
Sodium: 138 mmol/L (ref 134–144)
Total Protein: 6.4 g/dL (ref 6.0–8.5)

## 2019-03-02 LAB — CBC WITH DIFFERENTIAL/PLATELET
Basophils Absolute: 0 10*3/uL (ref 0.0–0.2)
Basos: 0 %
EOS (ABSOLUTE): 0.1 10*3/uL (ref 0.0–0.4)
Eos: 1 %
Hematocrit: 42 % (ref 37.5–51.0)
Hemoglobin: 15.1 g/dL (ref 13.0–17.7)
Immature Grans (Abs): 0 10*3/uL (ref 0.0–0.1)
Immature Granulocytes: 0 %
Lymphocytes Absolute: 1.1 10*3/uL (ref 0.7–3.1)
Lymphs: 13 %
MCH: 32.5 pg (ref 26.6–33.0)
MCHC: 36 g/dL — ABNORMAL HIGH (ref 31.5–35.7)
MCV: 90 fL (ref 79–97)
Monocytes Absolute: 0.5 10*3/uL (ref 0.1–0.9)
Monocytes: 6 %
Neutrophils Absolute: 7.1 10*3/uL — ABNORMAL HIGH (ref 1.4–7.0)
Neutrophils: 80 %
Platelets: 136 10*3/uL — ABNORMAL LOW (ref 150–450)
RBC: 4.65 x10E6/uL (ref 4.14–5.80)
RDW: 12.7 % (ref 11.6–15.4)
WBC: 8.9 10*3/uL (ref 3.4–10.8)

## 2019-03-02 LAB — LIPID PANEL
Chol/HDL Ratio: 3.8 ratio (ref 0.0–5.0)
Cholesterol, Total: 142 mg/dL (ref 100–199)
HDL: 37 mg/dL — ABNORMAL LOW (ref >39)
LDL Calculated: 66 mg/dL (ref 0–99)
Triglycerides: 194 mg/dL — ABNORMAL HIGH (ref 0–149)
VLDL Cholesterol Cal: 39 mg/dL (ref 5–40)

## 2019-03-02 NOTE — Telephone Encounter (Signed)
Pt was advised Henry Rogers 

## 2019-03-04 ENCOUNTER — Other Ambulatory Visit: Payer: Self-pay

## 2019-03-04 DIAGNOSIS — I2510 Atherosclerotic heart disease of native coronary artery without angina pectoris: Secondary | ICD-10-CM

## 2019-03-04 MED ORDER — TICAGRELOR 60 MG PO TABS: 60.0000 mg | ORAL_TABLET | Freq: Two times a day (BID) | ORAL | 3 refills | Status: DC

## 2019-06-05 NOTE — Progress Notes (Deleted)
Henry Rogers Date of Birth: 09/03/1950 Medical Record #[id number]  History of Present Illness: Henry Rogers is seen for follow up CAD. He is s/p inferior STEMI on 02/02/14. The Mid RCA was occluded with large thrombus load and a very ectatic vessel. He had cardiogenic shock. The RCA was stented with a 5.0x16 mm Veriflex stent post dilated to 6.0. EF was 45-50%. Other vessels had no significant disease. Repeat Echo in September 2015 showed that his LV function had normalized. EF 55-60%.  He was readmitted on May 06, 2015 with recurrent inferior infarct again associated with cardiogenic shock. The stent in the mid RCA was patent but the distal RCA was occluded with heavy thrombus. This was treated with POBA only with a 5.0 mm balloon. He has been maintained on DAPT. EGD Dec 2019 was normal.     He continues to do interval training at [facility] three days a week and walks his dog daily. His major complaint today is of increased GERD symptoms which is clearly different than his cardiac pain. Planning to undergo upper EGD by Dr. Christella Hartigan. He does note he bruises easily on Brilinta.  Allergies as of 06/07/2019      Reactions   Ace Inhibitors    No personal hx reaction, but 2 family members have had persistent cough with the class and he prefers not to take ACEi.      Medication List       Accurate as of June 05, 2019  7:35 AM. If you have any questions, ask your nurse or doctor.        aspirin 81 MG tablet Take 81 mg by mouth daily.   atorvastatin 80 MG tablet Commonly known as: LIPITOR TAKE 1 TABLET(80 MG) BY MOUTH DAILY 6 PM   JUICE PLUS FIBRE PO Take 1 tablet by mouth 2 (two) times daily. Fruit blend and vegetable blend   Melatonin 10 MG Caps Take 10 mg by mouth at bedtime. Takes daily   metoprolol tartrate 25 MG tablet Commonly known as: LOPRESSOR Take 1 tablet (25 mg total) by mouth 2 (two) times daily.   multivitamin tablet Take 1 tablet by mouth  every evening.   nitroGLYCERIN 0.4 MG SL tablet Commonly known as: NITROSTAT Place 1 tablet (0.4 mg total) under the tongue every 5 (five) minutes as needed for chest pain. MAX 3 doses   omeprazole 40 MG capsule Commonly known as: PRILOSEC TAKE 1 CAPSULE(40 MG) BY MOUTH DAILY   oseltamivir 75 MG capsule Commonly known as: Tamiflu Take 1 capsule (75 mg total) by mouth 2 (two) times daily.   tadalafil 5 MG tablet Commonly known as: CIALIS Take 1 tablet (5 mg total) by mouth daily.   ticagrelor 60 MG Tabs tablet Commonly known as: Brilinta Take 1 tablet (60 mg total) by mouth 2 (two) times daily.   zolpidem 5 MG tablet Commonly known as: AMBIEN TAKE 1 TABLET BY MOUTH AT BEDTIME AS NEEDED FOR SLEEP       Allergies  Allergen Reactions  . Ace Inhibitors     No personal hx reaction, but 2 family members have had persistent cough with the class and he prefers not to take ACEi.    Past Medical History:  Diagnosis Date  . BPH (benign prostatic hyperplasia)   . CAD (coronary artery disease)    a. inf STEMI s/p BMS to mRCA (02/03/14)  b. inf STEMI s/p PCTA to RCA with slow reflow.  . Chronic diastolic CHF (congestive  heart failure) ([place])    a. 2D echo on 05/18/15 LVEF 55-60%, akinesis basal inferior wall, G2DD   . GERD (gastroesophageal reflux disease)   . Hearing loss   . Hemorrhoids   . HH (hiatus hernia)   . Ischemic cardiomyopathy    a. ECHO 01/2014 EF 45-50% and akinesis of the entire inferior myocardium. Mild MR.   b. now resolved (EF 55-60% on 04/22/15)    . Metabolic syndrome   . OSA (obstructive sleep apnea)    a. mild not requiring CPAP at this time  . PAF (paroxysmal atrial fibrillation) ([place])    a. brief episode after cardiac cath on 05/16/15- resolved after IV amiodarone.  . Tinnitus   . Varicose veins     Past Surgical History:  Procedure Laterality Date  . CARDIAC CATHETERIZATION N/A 05/16/2015   Procedure: Left Heart Cath and Coronary Angiography;   Surgeon: Belva Crome, MD;  Location: [place] [facility];  Service: Cardiovascular;  Laterality: N/A;  . CARDIAC CATHETERIZATION N/A 05/16/2015   Procedure: Coronary Stent Intervention;  Surgeon: Belva Crome, MD;  Location: [place] [facility];  Service: Cardiovascular;  Laterality: N/A;  . CARPAL TUNNEL RELEASE Bilateral 03/02/2015   Procedure: BILATERAL CARPAL TUNNEL RELEASE ;  Surgeon: Daryll Brod, MD;  Location: [place];  Service: Orthopedics;  Laterality: Bilateral;  . COLONOSCOPY  2006   Dr. Benson Norway  . CORONARY ANGIOPLASTY WITH STENT PLACEMENT  02/02/14   BMS to RCA  . ENDOVENOUS ABLATION SAPHENOUS VEIN W/ LASER Right 10-12-2014   EVLA right greater saphenous vein by Curt Jews MD  . LEFT HEART CATHETERIZATION WITH CORONARY ANGIOGRAM N/A 02/02/2014   Procedure: LEFT HEART CATHETERIZATION WITH CORONARY ANGIOGRAM;  Surgeon: Undra Trembath M Martinique, MD;  Location: [facility];  Service: Cardiovascular;  Laterality: N/A;  . stab phlebectomy  Right 03-15-2015   stab phlebectomy > 20 incisions (right leg) by Curt Jews MD    Social History   Socioeconomic History  . Marital status: Married    Spouse name: Not on file  . Number of children: Not on file  . Years of education: Not on file  . Highest education level: Not on file  Occupational History  . Not on file  Social Needs  . Financial resource strain: Not on file  . Food insecurity    Worry: Not on file    Inability: Not on file  . Transportation needs    Medical: Not on file    Non-medical: Not on file  Tobacco Use  . Smoking status: Former Research scientist (life sciences)  . Smokeless tobacco: Never Used  Substance and Sexual Activity  . Alcohol use: Yes    Alcohol/week: 3.0 standard drinks    Types: 3 Glasses of wine per week  . Drug use: No  . Sexual activity: Not Currently  Lifestyle  . Physical activity    Days per week: Not on file    Minutes per session: Not on file  . Stress: Not on file  Relationships  . Social Product manager on phone: Not on file    Gets together: Not on file    Attends religious service: Not on file    Active member of club or organization: Not on file    Attends meetings of clubs or organizations: Not on file    Relationship status: Not on file  Other Topics Concern  . Not on file  Social History Narrative  . Not on file  Family History  Problem Relation Age of Onset  . Hypertension Mother   . Cervical cancer Mother   . Breast cancer Mother   . Skin cancer Mother   . Colon cancer Neg Hx   . Esophageal cancer Neg Hx     Review of Systems: As noted in HPI.  All other systems were reviewed and are negative.  Physical Exam: There were no vitals taken for this visit. There were no vitals filed for this visit.GENERAL:  Well appearing WM in NAD HEENT:  PERRL, EOMI, sclera are clear. Oropharynx is clear. NECK:  No jugular venous distention, carotid upstroke brisk and symmetric, no bruits, no thyromegaly or adenopathy LUNGS:  Clear to auscultation bilaterally CHEST:  Unremarkable HEART:  RRR,  PMI not displaced or sustained,S1 and S2 within normal limits, no S3, no S4: no clicks, no rubs, no murmurs ABD:  Soft, nontender. BS +, no masses or bruits. No hepatomegaly, no splenomegaly EXT:  2 + pulses throughout, no edema, no cyanosis no clubbing SKIN:  Warm and dry.  No rashes NEURO:  Alert and oriented x 3. Cranial nerves II through XII intact. PSYCH:  Cognitively intact        LABORATORY DATA: Lab Results  Component Value Date   WBC 8.9 03/01/2019   HGB 15.1 03/01/2019   HCT 42.0 03/01/2019   PLT 136 (L) 03/01/2019   GLUCOSE 105 (H) 03/01/2019   CHOL 142 03/01/2019   TRIG 194 (H) 03/01/2019   HDL 37 (L) 03/01/2019   LDLCALC 66 03/01/2019   ALT 26 03/01/2019   AST 24 03/01/2019   NA 138 03/01/2019   K 4.6 03/01/2019   CL 100 03/01/2019   CREATININE 1.05 03/01/2019   BUN 16 03/01/2019   CO2 23 03/01/2019   TSH 0.973 02/02/2014   PSA 0.4 02/19/2017   INR  1.03 02/02/2014   HGBA1C 5.9 (H) 02/25/2018    Ecg today shows NSR rate 63 with old inferior infarct. I have personally reviewed and interpreted this study.  Assessment / Plan: 1. CAD s/p inferior STEMI 02/02/14 with cardiogenic shock. S/p BMS of mid RCA. Very ectatic vessel. 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 Large ectatic vessel with a lot of thrombus. He remains asymptomatic.   Given recurrent infarction I would recommend DAPT indefinitely. Now on ASA 81 mg daily and Brilinta 60 mg bid.  Continue statin and metoprolol.  Follow up in 6 months.  2. Ischemic cardiomyopathy with EF 45-50%. Repeat Echo showed normal LV function in October 2016. ACEi stopped before due to hypotension. Continue low dose metoprolol.  3. Hyperlipidemia.   Continue high dose statin. LDL at goal < 70.   4. GERD planning EGD.

## 2019-06-07 ENCOUNTER — Ambulatory Visit: Payer: Medicare Other | Admitting: Cardiology

## 2019-06-29 ENCOUNTER — Encounter: Payer: Self-pay | Admitting: Physician Assistant

## 2019-06-29 ENCOUNTER — Ambulatory Visit: Payer: Medicare Other | Admitting: Physician Assistant

## 2019-06-29 ENCOUNTER — Other Ambulatory Visit: Payer: Self-pay

## 2019-06-29 VITALS — BP 118/66 | HR 60 | Temp 97.0°F | Ht 66.0 in | Wt 194.2 lb

## 2019-06-29 DIAGNOSIS — E785 Hyperlipidemia, unspecified: Secondary | ICD-10-CM

## 2019-06-29 DIAGNOSIS — I2510 Atherosclerotic heart disease of native coronary artery without angina pectoris: Secondary | ICD-10-CM

## 2019-06-29 DIAGNOSIS — I5032 Chronic diastolic (congestive) heart failure: Secondary | ICD-10-CM

## 2019-06-29 MED ORDER — FISH OIL 1000 MG PO CAPS: 1000.0000 mg | ORAL_CAPSULE | Freq: Two times a day (BID) | ORAL | 0 refills | Status: DC

## 2019-06-29 NOTE — Progress Notes (Signed)
Cardiology Office Note:    Date:  06/29/2019   ID:  Henry Rogers, DOB 1950-09-18, MRN [id number]  PCP:  Ronnald Nian, MD  Cardiologist:  Peter Swaziland, MD  Electrophysiologist:  None   Referring MD: Ronnald Nian, MD   Chief Complaint  Patient presents with  . Follow-up    seen for Dr. Swaziland. 6 month ov    History of Present Illness:    Henry Rogers is a 68 y.o. male with a hx of HLD, OSA, chronic diastolic heart failure and CAD.  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  He also had a cardiogenic shock.  RCA was stented with a 5 x 16 mm Veriflex stent postdilated to 6 mm.  EF was 45 to 50%.  The other coronary vessels did not have significant disease.  Repeat echocardiogram in September 2015 showed LV function was normalized to 55 to 60%.  He was readmitted in October 2016 with recurrent inferior infarction again associated with cardiogenic shock.  The stent in the mid RCA was patent however distal RCA was occluded was heavy thrombus.  This was treated with balloon angioplasty.  He had a transient episode of paroxysmal atrial fibrillation after the cardiac catheterization, this was resolved after IV amiodarone and has not recurred since.  He is not on any systemic anticoagulation due to the brief episodes without recurrence.   He was last contacted of the virtual visit in May 2020, at the time he was doing well without any exertional chest discomfort.  He presents today for 89-month follow-up.  He denies any recent exertional chest pain or shortness of breath recently.  Last lipid panel obtained in August 2020 showed borderline elevated hypertriglyceridemia, I recommended addition of over-the-counter fish oil.  Otherwise he continued to be quite active with his wife and walk the dog on a daily basis.  EKG showed inferior Q waves consistent with previous inferior MI.  He can follow-up in 6 to 9 months.     Past Medical History:  Diagnosis Date  . BPH (benign prostatic hyperplasia)   . CAD (coronary artery disease)    a. inf STEMI s/p BMS to mRCA (02/03/14)  b. inf STEMI s/p PCTA to RCA with slow reflow.  . Chronic diastolic CHF (congestive heart failure) (HCC)    a. 2D echo on 05/18/15 LVEF 55-60%, akinesis basal inferior wall, G2DD   . GERD (gastroesophageal reflux disease)   . Hearing loss   . Hemorrhoids   . HH (hiatus hernia)   . Ischemic cardiomyopathy    a. ECHO 01/2014 EF 45-50% and akinesis of the entire inferior myocardium. Mild MR.   b. now resolved (EF 55-60% on 04/22/15)    . Metabolic syndrome   . OSA (obstructive sleep apnea)    a. mild not requiring CPAP at this time  . PAF (paroxysmal atrial fibrillation) (HCC)    a. brief episode after cardiac cath on 05/16/15- resolved after IV amiodarone.  . Tinnitus   . Varicose veins     Past Surgical History:  Procedure Laterality Date  . CARDIAC CATHETERIZATION N/A 05/16/2015   Procedure: Left Heart Cath and Coronary Angiography;  Surgeon: Lyn Records, MD;  Location: [facility] INVASIVE CV LAB;  Service: Cardiovascular;  Laterality: N/A;  . CARDIAC CATHETERIZATION N/A 05/16/2015   Procedure: Coronary Stent Intervention;  Surgeon: Lyn Records, MD;  Location: [facility] INVASIVE CV LAB;  Service: Cardiovascular;  Laterality: N/A;  . CARPAL TUNNEL RELEASE Bilateral 03/02/2015   Procedure: BILATERAL CARPAL TUNNEL RELEASE ;  Surgeon: Daryll Brod, MD;  Location: [place];  Service: Orthopedics;  Laterality: Bilateral;  . COLONOSCOPY  2006   Dr. Benson Norway  . CORONARY ANGIOPLASTY WITH STENT PLACEMENT  02/02/14   BMS to RCA  . ENDOVENOUS ABLATION SAPHENOUS VEIN W/ LASER Right 10-12-2014   EVLA right greater saphenous vein by Curt Jews MD  . LEFT HEART CATHETERIZATION WITH CORONARY ANGIOGRAM N/A 02/02/2014   Procedure: LEFT HEART CATHETERIZATION WITH CORONARY ANGIOGRAM;  Surgeon: Peter M Martinique, MD;  Location: [facility] CATH LAB;  Service:  Cardiovascular;  Laterality: N/A;  . stab phlebectomy  Right 03-15-2015   stab phlebectomy > 20 incisions (right leg) by Curt Jews MD    Current Medications: Current Meds  Medication Sig  . aspirin 81 MG tablet Take 81 mg by mouth daily.  [name] atorvastatin (LIPITOR) 80 MG tablet TAKE 1 TABLET(80 MG) BY MOUTH DAILY 6 PM  . Melatonin 10 MG CAPS Take 10 mg by mouth at bedtime. Takes daily  . metoprolol tartrate (LOPRESSOR) 25 MG tablet Take 1 tablet (25 mg total) by mouth 2 (two) times daily.  . Multiple Vitamin (MULTIVITAMIN) tablet Take 1 tablet by mouth every evening.   . nitroGLYCERIN (NITROSTAT) 0.4 MG SL tablet Place 1 tablet (0.4 mg total) under the tongue every 5 (five) minutes as needed for chest pain. MAX 3 doses  . Nutritional Supplements (JUICE PLUS FIBRE PO) Take 1 tablet by mouth 2 (two) times daily. Fruit blend and vegetable blend  . omeprazole (PRILOSEC) 40 MG capsule TAKE 1 CAPSULE(40 MG) BY MOUTH DAILY  . tadalafil (CIALIS) 5 MG tablet Take 1 tablet (5 mg total) by mouth daily.  . ticagrelor (BRILINTA) 60 MG TABS tablet Take 1 tablet (60 mg total) by mouth 2 (two) times daily.  [name] zolpidem (AMBIEN) 5 MG tablet TAKE 1 TABLET BY MOUTH AT BEDTIME AS NEEDED FOR SLEEP     Allergies:   Ace inhibitors   Social History   Socioeconomic History  . Marital status: Married    Spouse name: Not on file  . Number of children: Not on file  . Years of education: Not on file  . Highest education level: Not on file  Occupational History  . Not on file  Social Needs  . Financial resource strain: Not on file  . Food insecurity    Worry: Not on file    Inability: Not on file  . Transportation needs    Medical: Not on file    Non-medical: Not on file  Tobacco Use  . Smoking status: Former Research scientist (life sciences)  . Smokeless tobacco: Never Used  Substance and Sexual Activity  . Alcohol use: Yes    Alcohol/week: 3.0 standard drinks    Types: 3 Glasses of wine per week  . Drug use: No  . Sexual  activity: Not Currently  Lifestyle  . Physical activity    Days per week: Not on file    Minutes per session: Not on file  . Stress: Not on file  Relationships  . Social Herbalist on phone: Not on file    Gets together: Not on file    Attends religious service: Not on file    Active member of club or organization: Not on file    Attends meetings of clubs or organizations: Not on file    Relationship status: Not on file  Other  Topics Concern  . Not on file  Social History Narrative  . Not on file     Family History: The patient's family history includes Breast cancer in his mother; Cervical cancer in his mother; Hypertension in his mother; Skin cancer in his mother. There is no history of Colon cancer or Esophageal cancer.  ROS:   Please see the history of present illness.     All other systems reviewed and are negative.  EKGs/Labs/Other Studies Reviewed:    The following studies were reviewed today:  Cath 05/16/2015 1. Mid RCA to Dist RCA lesion, 10% stenosed. The lesion was previously treated with a stent (unknown type). 2. Prox RCA to Mid RCA lesion, 25% stenosed. 3. Prox LAD to Dist LAD lesion, 40% stenosed. 4. Dist RCA lesion, 100% stenosed. Post intervention, there is a 10% residual stenosis.   Acute inferior myocardial infarction presenting with cardiogenic shock.  PTCA of the distal RCA from 100% to less than 10%. The vessel is large and ectatic containing heavy thrombus. Reperfusion was complicated by severe "no reflow".  Widely patent left coronary system.  Intra-aortic balloon pump to improve diastolic flow and help resolve "no reflow".  Left ventriculography was not performed   RECOMMENDATIONS:   One-to-one intra-aortic balloon, pulsation for at least 24 hours.  Intravenous Levophed to keep a mean arterial pressure greater than 65 mmHg.  Intravenous Aggrastat for at least 24 hours  Aspirin and Brilinta indefinitely.  IV amiodarone  to treat acute atrial fibrillation.  Prognosis is guarded. This was discussed with the patient's family in full detail.  IV heparin without a bolus to start in 8 hours.  Beta blocker therapy as tolerated by blood pressure.  2-D Doppler echocardiogram 24-48 hours hence.  Unfortunately, the patient will probably have a rocky post infarct course with potential for brady- and ventricular arrhythmias.  EKG:  EKG is ordered today.  The ekg ordered today demonstrates normal sinus rhythm with Q waves in the inferior leads.  Recent Labs: 03/01/2019: ALT 26; BUN 16; Creatinine, Ser 1.05; Hemoglobin 15.1; Platelets 136; Potassium 4.6; Sodium 138  Recent Lipid Panel    Component Value Date/Time   CHOL 142 03/01/2019 0911   TRIG 194 (H) 03/01/2019 0911   HDL 37 (L) 03/01/2019 0911   CHOLHDL 3.8 03/01/2019 0911   CHOLHDL 2.9 02/19/2017 0843   VLDL 15 02/19/2017 0843   LDLCALC 66 03/01/2019 0911    Physical Exam:    VS:  BP 118/66   Pulse 60   Temp (!) 97 F (36.1 C)   Ht 5\' 6"  (1.676 m)   Wt 194 lb 3.2 oz (88.1 kg)   SpO2 97%   BMI 31.34 kg/m     Wt Readings from Last 3 Encounters:  06/29/19 194 lb 3.2 oz (88.1 kg)  03/01/19 196 lb 3.2 oz (89 kg)  02/08/19 195 lb 12.8 oz (88.8 kg)     GEN:  Well nourished, well developed in no acute distress HEENT: Normal NECK: No JVD; No carotid bruits LYMPHATICS: No lymphadenopathy CARDIAC: RRR, no murmurs, rubs, gallops RESPIRATORY:  Clear to auscultation without rales, wheezing or rhonchi  ABDOMEN: Soft, non-tender, non-distended MUSCULOSKELETAL:  No edema; No deformity  SKIN: Warm and dry NEUROLOGIC:  Alert and oriented x 3 PSYCHIATRIC:  Normal affect   ASSESSMENT:    1. Coronary artery disease involving native coronary artery of native heart without angina pectoris   2. Hyperlipidemia LDL goal <70   3. Chronic diastolic heart failure (HCC)  PLAN:    In order of problems listed above:  1. CAD: Denies any recent anginal  symptoms.  Continue aspirin, Lipitor and metoprolol  2. Hyperlipidemia: Recent lab work showed borderline hypertriglyceridemia.  Add fish oil  3. Chronic diastolic heart failure: No sign of volume overload.   Medication Adjustments/Labs and Tests Ordered: Current medicines are reviewed at length with the patient today.  Concerns regarding medicines are outlined above.  Orders Placed This Encounter  Procedures  . EKG 12-Lead   Meds ordered this encounter  Medications  . Omega-3 Fatty Acids (FISH OIL) 1000 MG CAPS    Sig: Take 1 capsule (1,000 mg total) by mouth 2 (two) times daily.    Refill:  0    Patient Instructions  Medication Instructions:   START FISH OIL 1000mg  2 times a day-Can be brought over the counter *If you need a refill on your cardiac medications before your next appointment, please call your pharmacy*  Lab Work:  NONE ordered at this time of appointment   If you have labs (blood work) drawn today and your tests are completely normal, you will receive your results only by: MyChart Message (if you have MyChart) OR . A paper copy in the mail If you have any lab test that is abnormal or we need to change your treatment, we will call you to review the results.  Testing/Procedures:  NONE ordered at this time of appointment   Follow-Up: At [facility], you and your health needs are our priority.  As part of our continuing mission to provide you with exceptional heart care, we have created designated Provider Care Teams.  These Care Teams include your primary Cardiologist (physician) and Advanced Practice Providers (APPs -  Physician Assistants and Nurse Practitioners) who all work together to provide you with the care you need, when you need it.  Your next appointment:   6 month(s)  The format for your next appointment:   Either In Person or Virtual  Provider:   Peter [facility], MD  Other Instructions      Signed, Sw[place], PA  06/29/2019 9:27 AM     [facility]

## 2019-06-29 NOTE — Patient Instructions (Signed)
Medication Instructions:   START FISH OIL 1000mg  2 times a day-Can be brought over the counter *If you need a refill on your cardiac medications before your next appointment, please call your pharmacy*  Lab Work:  NONE ordered at this time of appointment   If you have labs (blood work) drawn today and your tests are completely normal, you will receive your results only by: [name] MyChart Message (if you have MyChart) OR . A paper copy in the mail If you have any lab test that is abnormal or we need to change your treatment, we will call you to review the results.  Testing/Procedures:  NONE ordered at this time of appointment   Follow-Up: At [facility], you and your health needs are our priority.  As part of our continuing mission to provide you with exceptional heart care, we have created designated Provider Care Teams.  These Care Teams include your primary Cardiologist (physician) and Advanced Practice Providers (APPs -  Physician Assistants and Nurse Practitioners) who all work together to provide you with the care you need, when you need it.  Your next appointment:   6 month(s)  The format for your next appointment:   Either In Person or Virtual  Provider:   Peter Martinique, MD  Other Instructions

## 2019-08-18 ENCOUNTER — Ambulatory Visit: Payer: Medicare Other

## 2019-08-27 ENCOUNTER — Ambulatory Visit: Payer: Medicare PPO | Attending: Internal Medicine

## 2019-08-27 DIAGNOSIS — Z23 Encounter for immunization: Secondary | ICD-10-CM | POA: Insufficient documentation

## 2019-08-27 NOTE — Progress Notes (Signed)
   Covid-19 Vaccination Clinic  Name:  Henry Rogers    MRN: [id number] DOB: 1951-05-30  08/27/2019  [name] was observed post Covid-19 immunization for 15 minutes without incidence. He was provided with Vaccine Information Sheet and instruction to access the V-Safe system.   [name] was instructed to call 911 with any severe reactions post vaccine: [name] Difficulty breathing  . Swelling of your face and throat  . A fast heartbeat  . A bad rash all over your body  . Dizziness and weakness    Immunizations Administered    Name Date Dose VIS Date Route   Pfizer COVID-19 Vaccine 08/27/2019 10:04 AM 0.3 mL 07/01/2019 Intramuscular   Manufacturer: [employer], [employer]   Lot: [id number]   NDC: [address]-7

## 2019-08-30 ENCOUNTER — Encounter: Payer: Self-pay | Admitting: Internal Medicine

## 2019-08-30 ENCOUNTER — Telehealth: Payer: Self-pay | Admitting: Internal Medicine

## 2019-08-30 DIAGNOSIS — G4733 Obstructive sleep apnea (adult) (pediatric): Secondary | ICD-10-CM

## 2019-08-30 DIAGNOSIS — N400 Enlarged prostate without lower urinary tract symptoms: Secondary | ICD-10-CM

## 2019-08-30 NOTE — Telephone Encounter (Signed)
Pt called and states he has new insurance- [employer] and now needs a prior auth for his Tadalafil 5mg  for his bladder control. He is going to try to upload his new insurance through his mychart.

## 2019-09-05 NOTE — Telephone Encounter (Signed)
I submitted an appeal for his Tadalafil 5mg  and I am waiting to hear back from it.

## 2019-09-14 NOTE — Telephone Encounter (Signed)
The pt. Called and I gave him an update on his appeal for the medication. The insurance sent over more paperwork to be sent in for the appeal and it was faxed 09/13/19.

## 2019-09-15 MED ORDER — TADALAFIL 5 MG PO TABS: 5.0000 mg | ORAL_TABLET | Freq: Every day | ORAL | 3 refills | Status: DC

## 2019-09-15 NOTE — Addendum Note (Signed)
Addended by: Ronnald Nian on: 09/15/2019 01:27 PM   Modules accepted: Orders

## 2019-09-15 NOTE — Telephone Encounter (Signed)
I submitted an appeal for the pts. Tadalafil and they just called from the insurance company and it was denied, so his medication will need to be switched to something else opr pt. Will have to pay out of pocket for the medication.

## 2019-09-19 ENCOUNTER — Telehealth: Payer: Self-pay

## 2019-09-19 NOTE — Telephone Encounter (Signed)
I submitted an appeal for the pts. Tadalafil and it was denied by the insurance company so you will have to change the medication. The pt. Has to have tried and failed both finasteride and dutasteride.

## 2019-09-21 ENCOUNTER — Ambulatory Visit: Payer: Medicare PPO | Attending: Internal Medicine

## 2019-09-21 DIAGNOSIS — Z23 Encounter for immunization: Secondary | ICD-10-CM | POA: Insufficient documentation

## 2019-09-21 NOTE — Progress Notes (Signed)
   Covid-19 Vaccination Clinic  Name:  Henry Rogers    MRN: [id number] DOB: 12-23-1950  09/21/2019  Mr. Lemon was observed post Covid-19 immunization for 15 minutes without incident. He was provided with Vaccine Information Sheet and instruction to access the V-Safe system.   [name] was instructed to call 911 with any severe reactions post vaccine: [name] Difficulty breathing  . Swelling of face and throat  . A fast heartbeat  . A bad rash all over body  . Dizziness and weakness   Immunizations Administered    Name Date Dose VIS Date Route   Pfizer COVID-19 Vaccine 09/21/2019  8:35 AM 0.3 mL 07/01/2019 Intramuscular   Manufacturer: [employer], [employer]   Lot: [id number]   NDC: [address]-2

## 2019-10-07 ENCOUNTER — Other Ambulatory Visit: Payer: Self-pay

## 2019-10-07 ENCOUNTER — Encounter: Payer: Self-pay | Admitting: Family Medicine

## 2019-10-07 ENCOUNTER — Ambulatory Visit: Payer: Medicare PPO | Admitting: Family Medicine

## 2019-10-07 VITALS — BP 142/92 | HR 70 | Temp 97.5°F | Wt 198.6 lb

## 2019-10-07 DIAGNOSIS — M545 Low back pain, unspecified: Secondary | ICD-10-CM

## 2019-10-07 NOTE — Progress Notes (Signed)
   Subjective:    Patient ID: Henry Rogers, male    DOB: [birth year], 69 y.o.   MRN: [id number]  HPI He complains of a 1 week history of low back pain especially on the right.  This occurred after doing a lot of yard work requiring him to squat down.  The pain is now located mainly on the right.  No numbness, tingling, weakness or referred pain.   Review of Systems     Objective:   Physical Exam Alert and in no distress.  No palpable tenderness over lumbar or SI joint area.  Negative straight leg raising.  DTRs normal.  Pearlean Brownie testing negative.      Assessment & Plan:  Acute right-sided low back pain without sciatica Heat for 20 minutes 3 times per day and gentle stretching after that and then use 800 mg of ibuprofen 3 times per day.  Do this for minimum of the week. Will call if continued difficulty.

## 2019-10-07 NOTE — Patient Instructions (Signed)
Heat for 20 minutes 3 times per day and gentle stretching after that and then use 800 mg of ibuprofen 3 times per day.  Do this for minimum of the week.

## 2019-10-12 DIAGNOSIS — H43813 Vitreous degeneration, bilateral: Secondary | ICD-10-CM | POA: Diagnosis not present

## 2019-10-12 DIAGNOSIS — H10413 Chronic giant papillary conjunctivitis, bilateral: Secondary | ICD-10-CM | POA: Diagnosis not present

## 2019-10-12 DIAGNOSIS — H04123 Dry eye syndrome of bilateral lacrimal glands: Secondary | ICD-10-CM | POA: Diagnosis not present

## 2019-10-12 DIAGNOSIS — H2513 Age-related nuclear cataract, bilateral: Secondary | ICD-10-CM | POA: Diagnosis not present

## 2019-10-12 DIAGNOSIS — G4730 Sleep apnea, unspecified: Secondary | ICD-10-CM | POA: Diagnosis not present

## 2019-10-28 ENCOUNTER — Other Ambulatory Visit: Payer: Self-pay

## 2019-10-28 MED ORDER — NITROGLYCERIN 0.4 MG SL SUBL: 0.4000 mg | SUBLINGUAL_TABLET | SUBLINGUAL | 7 refills | Status: DC | PRN

## 2019-11-24 ENCOUNTER — Other Ambulatory Visit: Payer: Self-pay | Admitting: Gastroenterology

## 2019-11-28 ENCOUNTER — Telehealth: Payer: Self-pay

## 2019-11-28 NOTE — Telephone Encounter (Signed)
I submitted a PA for the pts. Tadalafil 5mg  and it was approved from now until 07/20/20.

## 2019-11-29 ENCOUNTER — Telehealth: Payer: Self-pay | Admitting: Family Medicine

## 2019-11-29 MED ORDER — OMEPRAZOLE 20 MG PO CPDR: 20.0000 mg | DELAYED_RELEASE_CAPSULE | Freq: Every day | ORAL | 3 refills | Status: DC

## 2019-11-29 NOTE — Telephone Encounter (Signed)
Pt called and is requesting a refill on omeprazole 20 mg states he takes this at night and the 40 mg in the am, pt would like it sent to the [id number] - [place], [place] - [address] AT [facility] [address] & [facility] [address]

## 2019-12-12 ENCOUNTER — Other Ambulatory Visit: Payer: Self-pay

## 2019-12-12 ENCOUNTER — Encounter: Payer: Self-pay | Admitting: Family Medicine

## 2019-12-12 ENCOUNTER — Ambulatory Visit: Payer: Medicare PPO | Admitting: Family Medicine

## 2019-12-12 VITALS — BP 118/80 | HR 71 | Temp 97.9°F | Wt 195.2 lb

## 2019-12-12 DIAGNOSIS — G8929 Other chronic pain: Secondary | ICD-10-CM

## 2019-12-12 DIAGNOSIS — M25562 Pain in left knee: Secondary | ICD-10-CM

## 2019-12-12 MED ORDER — TRIAMCINOLONE ACETONIDE 40 MG/ML IJ SUSP
40.0000 mg | Freq: Once | INTRAMUSCULAR | Status: AC
  Administered 2019-12-12: 40 mg via INTRAMUSCULAR

## 2019-12-12 MED ORDER — LIDOCAINE HCL 1 % IJ SOLN
10.0000 mL | Freq: Once | INTRAMUSCULAR | Status: AC
  Administered 2019-12-12: 10 mL via INTRADERMAL

## 2019-12-12 NOTE — Progress Notes (Signed)
   Subjective:    Patient ID: Henry Rogers, male    DOB: 08-27-1950, 69 y.o.   MRN: [id number]  HPI He is here for evaluation of continued difficulty with knee pain.  He did have this evaluated in July 2020 with an x-ray.  He was treated then with an NSAID and muscle strengthening.  Over the last several months the knee pain has gotten worse and now causes him to limp but no popping, locking or grinding.   Review of Systems     Objective:   Physical Exam Left knee exam shows no effusion.  No tenderness over the joint line.  Negative ACL.  Medial lateral collateral ligaments intact.  McMurray's testing negative.       Assessment & Plan:  Chronic pain of left knee - Plan: lidocaine (XYLOCAINE) 1 % (with pres) injection 10 mL, triamcinolone acetonide (KENALOG-40) injection 40 mg I explained that since the x-ray was essentially negative and he has not responded to conservative care, an injection would be appropriate.  I will also send him to physical therapy.  If he continues have difficulty, referral to orthopedics will be made.

## 2019-12-22 ENCOUNTER — Ambulatory Visit: Payer: Medicare PPO | Attending: Family Medicine

## 2019-12-22 ENCOUNTER — Other Ambulatory Visit: Payer: Self-pay

## 2019-12-22 DIAGNOSIS — M25562 Pain in left knee: Secondary | ICD-10-CM | POA: Diagnosis not present

## 2019-12-22 DIAGNOSIS — R600 Localized edema: Secondary | ICD-10-CM | POA: Diagnosis not present

## 2019-12-22 DIAGNOSIS — G8929 Other chronic pain: Secondary | ICD-10-CM | POA: Insufficient documentation

## 2019-12-22 DIAGNOSIS — R262 Difficulty in walking, not elsewhere classified: Secondary | ICD-10-CM | POA: Insufficient documentation

## 2019-12-22 NOTE — Therapy (Signed)
[facility] [address] [place], [place], [address] Phone: [phone]   Fax:  [phone]  Physical Therapy Evaluation  Patient Details  Name: Henry Rogers MRN: [id number] Date of Birth: July 12, 1951 Referring Provider (PT): Ronnald Nian, MD   Encounter Date: 12/22/2019  PT End of Session - 12/22/19 1035    Visit Number  1    Number of Visits  9    Date for PT Re-Evaluation  02/02/20    Authorization Type  [employer] MEDIC[employer] CHOICE PPO    Progress Note Due on Visit  5    PT Start Time  0915    PT Stop Time  1011    PT Time Calculation (min)  56 min    Activity Tolerance  Patient tolerated treatment well    Behavior During Therapy  [facility] for tasks assessed/performed       Past Medical History:  Diagnosis Date  . BPH (benign prostatic hyperplasia)   . CAD (coronary artery disease)    a. inf STEMI s/p BMS to mRCA (02/03/14)  b. inf STEMI s/p PCTA to RCA with slow reflow.  . Chronic diastolic CHF (congestive heart failure) (HCC)    a. 2D echo on 05/18/15 LVEF 55-60%, akinesis basal inferior wall, G2DD   . GERD (gastroesophageal reflux disease)   . Hearing loss   . Hemorrhoids   . HH (hiatus hernia)   . Ischemic cardiomyopathy    a. ECHO 01/2014 EF 45-50% and akinesis of the entire inferior myocardium. Mild MR.   b. now resolved (EF 55-60% on 04/22/15)    . Metabolic syndrome   . OSA (obstructive sleep apnea)    a. mild not requiring CPAP at this time  . PAF (paroxysmal atrial fibrillation) (HCC)    a. brief episode after cardiac cath on 05/16/15- resolved after IV amiodarone.  . Tinnitus   . Varicose veins     Past Surgical History:  Procedure Laterality Date  . CARDIAC CATHETERIZATION N/A 05/16/2015   Procedure: Left Heart Cath and Coronary Angiography;  Surgeon: Lyn Records, MD;  Location: [facility] INVASIVE CV LAB;  Service: Cardiovascular;  Laterality: N/A;  . CARDIAC CATHETERIZATION N/A 05/16/2015   Procedure:  Coronary Stent Intervention;  Surgeon: Lyn Records, MD;  Location: [facility] INVASIVE CV LAB;  Service: Cardiovascular;  Laterality: N/A;  . CARPAL TUNNEL RELEASE Bilateral 03/02/2015   Procedure: BILATERAL CARPAL TUNNEL RELEASE ;  Surgeon: Cindee Salt, MD;  Location: [facility];  Service: Orthopedics;  Laterality: Bilateral;  . COLONOSCOPY  2006   Dr. Elnoria Howard  . CORONARY ANGIOPLASTY WITH STENT PLACEMENT  02/02/14   BMS to RCA  . ENDOVENOUS ABLATION SAPHENOUS VEIN W/ LASER Right 10-12-2014   EVLA right greater saphenous vein by Gretta Began MD  . LEFT HEART CATHETERIZATION WITH CORONARY ANGIOGRAM N/A 02/02/2014   Procedure: LEFT HEART CATHETERIZATION WITH CORONARY ANGIOGRAM;  Surgeon: Peter M Swaziland, MD;  Location: [facility] CATH LAB;  Service: Cardiovascular;  Laterality: N/A;  . stab phlebectomy  Right 03-15-2015   stab phlebectomy > 20 incisions (right leg) by Gretta Began MD    There were no vitals filed for this visit.   Subjective Assessment - 12/22/19 1138    Subjective  Pt reports a chronic Hx of L knee pain with the initial incident occuring in Sept of 2020. Following a regime of Advil and heat the pain resolved until Jan. After a day of yardwork and gardening, his L knee pain returned. pt  reports it was getting wrose, but received a steroid injection last week and the pain has resolved. Pt states he has been walking his large dog, walking briskly.    Limitations  Other (comment)   yardwork, gardening   How long can you sit comfortably?  Not an issue    How long can you stand comfortably?  Not an issue    How long can you walk comfortably?  Not and issue    Diagnostic tests  To be active and have little to no L knee pain    Patient Stated Goals  To be active and have little to no L knee pain    Currently in Pain?  No/denies         [facility] PT Assessment - 12/22/19 0001      Assessment   Medical Diagnosis  Chronic pain of left knee    Referring Provider (PT)  Ronnald Nian, MD     Onset Date/Surgical Date  --   5 months ago   Next MD Visit  03/13/20      Precautions   Precautions  None      Restrictions   Weight Bearing Restrictions  No      Balance Screen   Has the patient fallen in the past 6 months  No    Has the patient had a decrease in activity level because of a fear of falling?   No    Is the patient reluctant to leave their home because of a fear of falling?   No      Home Public house manager residence    Living Arrangements  Spouse/significant other    Type of Home  House      Prior Function   Level of Independence  Independent      Cognition   Overall Cognitive Status  Within Functional Limits for tasks assessed      Observation/Other Assessments   Focus on Therapeutic Outcomes (FOTO)   31%      Coordination   Gross Motor Movements are Fluid and Coordinated  Yes      Posture/Postural Control   Posture/Postural Control  Postural limitations    Postural Limitations  Forward head;Rounded Shoulders      ROM / Strength   AROM / PROM / Strength  AROM;Strength      AROM   Overall AROM Comments  AROM B knees = WNLs      Strength   Overall Strength Comments  Strength B knees =5/5      Flexibility   Soft Tissue Assessment /Muscle Length  yes    Hamstrings  B 60d      Palpation   Palpation comment  L medial jt line and and patella tendon were the areas of pain, but currently not tender. Slight swelling of the medial joint space      Special Tests    Special Tests  Knee Special Tests    Knee Special tests   McConnell Test;Patellofemoral Apprehension Test;Step-up/Step Down Test;Patellofemoral Grind Test (Clarke's Sign)      McConnell Test   Findings  Negative    Side  Left      Patellofemoral Apprehension Test    Findings  Negative    Side   Left      Step-up/Step Down    Findings  Negative    Side   Left      Patellofemoral Grind test (Clark's Sign)   Findings  Negative    Side   Left      Transfers    Transfers  Sit to Stand;Stand to Sit    Sit to Stand  7: Independent      Ambulation/Gait   Ambulation/Gait  Yes    Ambulation/Gait Assistance  7: Independent                  Objective measurements completed on examination: See above findings.              PT Education - 12/22/19 1031    Education Details  Eval findings, POC, HEP, and measures for the prevention and management of L knee pain avoiding prolonged sustained stress on the knees, less stressful squating and kneeling positions, XFM, and use of cold or heat    Person(s) Educated  Patient    Methods  Explanation;Demonstration;Tactile cues;Verbal cues;Handout    Comprehension  Verbalized understanding;Returned demonstration;Verbal cues required;Tactile cues required;Need further instruction       PT Short Term Goals - 12/22/19 2235      PT SHORT TERM GOAL #1   Title  Pt will be Ind in a HEP    Baseline  In progress    Time  3    Period  Weeks    Status  New    Target Date  01/12/20      PT SHORT TERM GOAL #2   Title  Pt will voice understanding of measures to reduce L knee strain, and reduce and manage L knee pain.    Baseline  Decreased understanding    Time  3    Period  Weeks    Status  New    Target Date  01/12/20      PT SHORT TERM GOAL #3   Title  Pt will report return to regularly completing gardening, yardwork, and walking with little to no L knee pain    Baseline  Initially improved following a cortisone injection    Time  3    Period  Weeks    Status  New    Target Date  01/12/20      PT SHORT TERM GOAL #4   Title  Established LTGs if pt returns to PT in need of additional services    Time  3    Period  Weeks    Status  New    Target Date  01/12/20        PT Long Term Goals - 12/22/19 2243      PT LONG TERM GOAL #1   Title  To be determined as needed             Plan - 12/22/19 1037    Clinical Impression Statement  Pt presents to PT with chronic L knee pain.  Currently, the pt is not experiencing pain following an injection on 12/12/19. Pt IDs the pain location along the medial joint line and patellar tendon. With today's eval, pt pts pain was not able to be provoked. PF tests were negative and there was no issues with crepitus. A HEP for LE strengthening was started, and instructed pt. in measures for the prevention and management of L knee pain avoiding prolonged sustained stress on the knees, less stressful squating and kneeling positions, XFM, and use of cold or heat. Since he is doing better, pt wished to see how he continued to do. A return appt was set up for 01/11/20 as needed for reassessment and continuation of care.  Personal Factors and Comorbidities  Age;Time since onset of injury/illness/exacerbation;Past/Current Experience    Examination-Activity Limitations  Other;Squat   kneeling   Stability/Clinical Decision Making  Stable/Uncomplicated    Clinical Decision Making  Low    Rehab Potential  Good    PT Frequency  2x / week    PT Duration  4 weeks    PT Treatment/Interventions  ADLs/Self Care Home Management;Cryotherapy;Electrical Stimulation;Ultrasound;Moist Heat;Iontophoresis 4mg /ml Dexamethasone;Stair training;Therapeutic exercise;Therapeutic activities;Manual techniques;Patient/family education;Taping;Passive range of motion;Dry needling;Joint Manipulations    PT Next Visit Plan  Reassess pt's status and response to [place]. Standing LE exs    Consulted and Agree with Plan of Care  Patient       Patient will benefit from skilled therapeutic intervention in order to improve the following deficits and impairments:  Pain, Difficulty walking, Decreased activity tolerance, Increased edema  Visit Diagnosis: Chronic pain of left knee - Plan: PT plan of care cert/re-cert  Difficulty in walking, not elsewhere classified - Plan: PT plan of care cert/re-cert  Localized edema - Plan: PT plan of care  cert/re-cert     Problem List Patient Active Problem List   Diagnosis Date Noted  . Old inferior wall myocardial infarction 10/02/2016  . Former smoker 01/11/2016  . Chronic diastolic CHF (congestive heart failure) ([place])   . PAF (paroxysmal atrial fibrillation) ([place])   . CAD (coronary artery disease)   . OSA (obstructive sleep apnea)   . Varicose veins of leg with complications 45/40/9811  . Hyperlipidemia LDL goal <70 02/22/2014  . Metabolic syndrome 91/47/8295  . Hearing loss   . BPH (benign prostatic hyperplasia) 02/22/2013  . Actinic keratosis 02/22/2013  . ED (erectile dysfunction) 02/20/2012  . GERD (gastroesophageal reflux disease) 02/20/2012    Gar Ponto MS, PT 12/22/19 10:51 PM  [facility] [address] [place], [place], [address] Phone: [phone]   Fax:  [phone]  Name: Henry Rogers MRN: [id number] Date of Birth: October 27, 1950

## 2020-01-11 ENCOUNTER — Ambulatory Visit: Payer: Medicare PPO

## 2020-01-11 ENCOUNTER — Other Ambulatory Visit: Payer: Self-pay

## 2020-01-11 DIAGNOSIS — R600 Localized edema: Secondary | ICD-10-CM | POA: Diagnosis not present

## 2020-01-11 DIAGNOSIS — G8929 Other chronic pain: Secondary | ICD-10-CM | POA: Diagnosis not present

## 2020-01-11 DIAGNOSIS — R262 Difficulty in walking, not elsewhere classified: Secondary | ICD-10-CM

## 2020-01-11 DIAGNOSIS — M25562 Pain in left knee: Secondary | ICD-10-CM | POA: Diagnosis not present

## 2020-01-11 NOTE — Therapy (Addendum)
[place], [place], [address] Phone: [phone]   Fax:  [phone]   Physical Therapy Treatment/Discharged Summary  Patient Details  Name: Henry Rogers MRN: [id number] Date of Birth: 09/11/1950 Referring Provider (PT): Denita Lung, MD   Encounter Date: 01/11/2020   PT End of Session - 01/11/20 1329    Visit Number 2    Number of Visits 9    Date for PT Re-Evaluation 02/02/20    Authorization Type [employer] MEDICARE CHOICE PPO    PT Start Time 1052    PT Stop Time 1132    PT Time Calculation (min) 40 min    Activity Tolerance Patient tolerated treatment well    Behavior During Therapy [facility] for tasks assessed/performed           Past Medical History:  Diagnosis Date  . BPH (benign prostatic hyperplasia)   . CAD (coronary artery disease)    a. inf STEMI s/p BMS to mRCA (02/03/14)  b. inf STEMI s/p PCTA to RCA with slow reflow.  . Chronic diastolic CHF (congestive heart failure) ([place])    a. 2D echo on 05/18/15 LVEF 55-60%, akinesis basal inferior wall, G2DD   . GERD (gastroesophageal reflux disease)   . Hearing loss   . Hemorrhoids   . HH (hiatus hernia)   . Ischemic cardiomyopathy    a. ECHO 01/2014 EF 45-50% and akinesis of the entire inferior myocardium. Mild MR.   b. now resolved (EF 55-60% on 04/22/15)    . Metabolic syndrome   . OSA (obstructive sleep apnea)    a. mild not requiring CPAP at this time  . PAF (paroxysmal atrial fibrillation) ([place])    a. brief episode after cardiac cath on 05/16/15- resolved after IV amiodarone.  . Tinnitus   . Varicose veins     Past Surgical History:  Procedure Laterality Date  . CARDIAC CATHETERIZATION N/A 05/16/2015   Procedure: Left Heart Cath and Coronary Angiography;  Surgeon: Belva Crome, MD;  Location: [facility] CV LAB;  Service: Cardiovascular;  Laterality: N/A;  . CARDIAC CATHETERIZATION N/A 05/16/2015   Procedure: Coronary Stent  Intervention;  Surgeon: Belva Crome, MD;  Location: [facility] CV LAB;  Service: Cardiovascular;  Laterality: N/A;  . CARPAL TUNNEL RELEASE Bilateral 03/02/2015   Procedure: BILATERAL CARPAL TUNNEL RELEASE ;  Surgeon: Daryll Brod, MD;  Location: [facility];  Service: Orthopedics;  Laterality: Bilateral;  . COLONOSCOPY  2006   Dr. Benson Norway  . CORONARY ANGIOPLASTY WITH STENT PLACEMENT  02/02/14   BMS to RCA  . ENDOVENOUS ABLATION SAPHENOUS VEIN W/ LASER Right 10-12-2014   EVLA right greater saphenous vein by Curt Jews MD  . LEFT HEART CATHETERIZATION WITH CORONARY ANGIOGRAM N/A 02/02/2014   Procedure: LEFT HEART CATHETERIZATION WITH CORONARY ANGIOGRAM;  Surgeon: Peter M Martinique, MD;  Location: [facility] CATH LAB;  Service: Cardiovascular;  Laterality: N/A;  . stab phlebectomy  Right 03-15-2015   stab phlebectomy > 20 incisions (right leg) by Curt Jews MD    There were no vitals filed for this visit.   Subjective Assessment - 01/11/20 1322    Subjective pt reports he has returned to being active walking his dog daily for 20 to 25 mins, and completing a HEP for strengthening (his own and PT provided) for 30 mins a day.Pt reports infrequent tenderness of his mediail L knee which her rates less than a 1, and is not having today.  Currently in Pain? No/denies                             [facility] Adult PT Treatment/Exercise - 01/11/20 0001      Therapeutic Activites    Therapeutic Activities Other Therapeutic Activities    Other Therapeutic Activities jogging on treadmill for 2 mins at 4 MPH      Exercises   Exercises Knee/Hip      Knee/Hip Exercises: Standing   Knee Flexion Strengthening;Right;Left    Knee Flexion Limitations 3reps; gray Tband    Hip Flexion Stengthening;Right;Left    Hip Flexion Limitations 3 reps; gray Tband    Forward Lunges Right;Left;10 reps    Forward Lunges Limitations 3 reps; gray Tband    Hip Abduction Stengthening;Right;Left     Abduction Limitations 3 reps; gray Tband    Hip Extension Stengthening    Extension Limitations 3 reps; gray Tband    Other Standing Knee Exercises side steps 5 reps c gray tband                  PT Education - 01/11/20 1327    Education Details The use of Tband to increase the resisitance of his HEP. The use of cross friction massage and cold packs to help manage min. pain and strains when they occur. And to gradually ramp up new exs and activities.    Person(s) Educated Patient    Methods Explanation;Demonstration;Tactile cues;Verbal cues;Handout    Comprehension Verbalized understanding;Returned demonstration;Verbal cues required            PT Short Term Goals - 01/11/20 1353      PT SHORT TERM GOAL #1   Title Pt will be Ind in a HEP. Met    Status Achieved      PT SHORT TERM GOAL #2   Title Pt will voice understanding of measures to reduce L knee strain, and reduce and manage L knee pain. Met    Status Achieved      PT SHORT TERM GOAL #3   Title Pt will report return to regularly completing gardening, yardwork, and walking with little to no L knee pain. Met    Status Achieved             PT Long Term Goals - 12/22/19 2243      PT LONG TERM GOAL #1   Title To be determined as needed                 Plan - 01/11/20 1330    Clinical Impression Statement Pt has continued to due well since the initial PT session. Pt has toleratrd increasing his activity without difficulty. Pt reports has experienced infrequent medial knee tenderness after activity which he does not consider pain. Re-assess of L knee revealed no PF, ligamentous or cartilage issues.Pt tolerated running on the treadmill for 2 mins without issue with his L knee..Pt expressed interest in running some again in the future, but was not sure of when he would try. Reviewed measures to manage pain if he experiences a flare up and gradually ramping up any new activity. Pt's FOTO score improved to 18%  limited from 31% limited on eval. The predicted outcome was 27%.  With pt doing well, it was decided to DC PT, but pt is aware he has the option to receive a new referral and return to PT as needed.    Personal Factors and Comorbidities Age;Time  since onset of injury/illness/exacerbation;Past/Current Experience    Examination-Activity Limitations Other;Squat    Stability/Clinical Decision Making Stable/Uncomplicated    Clinical Decision Making Low    Rehab Potential Good    PT Frequency 2x / week    PT Treatment/Interventions ADLs/Self Care Home Management;Cryotherapy;Electrical Stimulation;Ultrasound;Moist Heat;Iontophoresis 10m/ml Dexamethasone;Stair training;Therapeutic exercise;Therapeutic activities;Manual techniques;Patient/family education;Taping;Passive range of motion;Dry needling;Joint Manipulations    PT Home Exercise Plan [id number] Standing LE exs    Consulted and Agree with Plan of Care Patient           Patient will benefit from skilled therapeutic intervention in order to improve the following deficits and impairments:  Pain, Difficulty walking, Decreased activity tolerance, Increased edema  Visit Diagnosis: Chronic pain of left knee  Difficulty in walking, not elsewhere classified  Localized edema     Problem List Patient Active Problem List   Diagnosis Date Noted  . Old inferior wall myocardial infarction 10/02/2016  . Former smoker 01/11/2016  . Chronic diastolic CHF (congestive heart failure) ([place]   . PAF (parox[place]mal atrial fibrillation) ([place]   . CAD (coronary artery disease)   . OSA (obstructive sleep apnea)   . Varicose veins of leg with complications 054/30/1484 . Hyperlipidemia LDL goal <70 02/22/2014  . Metabolic syndrome 003/97/9536 . Hearing loss   . BPH (benign prostatic hyperplasia) 02/22/2013  . Actinic keratosis 02/22/2013  . ED (erectile dysfunction) 02/20/2012  . GERD (gastroesophageal reflux disease) 02/20/2012   AGar PontoMS,  PT 01/11/20 5:38 PM  PHYSICAL THERAPY DISCHARGE SUMMARY  Visits from Start of Care: 2  Current functional level related to goals / functional outcomes: See above   Remaining deficits: See above   Education / Equipment: HEP  Plan: Patient agrees to discharge.  Patient goals were met. Patient is being discharged due to meeting the stated rehab goals.  ?????      [place] [address]Phone: [phone]  Fax:  [phone] Name: Henry SPRINGSMRN: [id number] of Birth: [birth year]

## 2020-01-18 ENCOUNTER — Ambulatory Visit: Payer: Medicare PPO

## 2020-02-22 ENCOUNTER — Other Ambulatory Visit: Payer: Self-pay | Admitting: Family Medicine

## 2020-02-22 ENCOUNTER — Other Ambulatory Visit: Payer: Self-pay | Admitting: Cardiology

## 2020-02-22 DIAGNOSIS — I2510 Atherosclerotic heart disease of native coronary artery without angina pectoris: Secondary | ICD-10-CM

## 2020-02-22 DIAGNOSIS — E785 Hyperlipidemia, unspecified: Secondary | ICD-10-CM

## 2020-02-22 DIAGNOSIS — K219 Gastro-esophageal reflux disease without esophagitis: Secondary | ICD-10-CM

## 2020-03-11 NOTE — Progress Notes (Signed)
Cardiology Office Note:    Date:  03/15/2020   ID:  STEPHENS SHREVE, DOB [birth year], MRN [id number]  PCP:  Ronnald Nian, MD  Cardiologist:  Buford Gayler [place], MD  Electrophysiologist:  None   Referring MD: Ronnald Nian, MD   Chief Complaint  Patient presents with  . Coronary Artery Disease    History of Present Illness:    Henry Rogers is a 69 y.o. male with a hx of HLD, OSA, chronic diastolic heart failure and CAD.  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  He also had a cardiogenic shock.  RCA was stented with a 5 x 16 mm Veriflex stent postdilated to 6 mm.  EF was 45 to 50%.  The other coronary vessels did not have significant disease.  Repeat echocardiogram in September 2015 showed LV function was normalized to 55 to 60%.  He was readmitted in October 2016 with recurrent inferior infarction again associated with cardiogenic shock.  The stent in the mid RCA was patent however distal RCA was occluded was heavy thrombus.  This was treated with balloon angioplasty.  He had a transient episode of paroxysmal atrial fibrillation after the cardiac catheterization, this was resolved after IV amiodarone and has not recurred since.  He is not on any systemic anticoagulation due to the brief episodes without recurrence. He is on chronic DAPT.  On follow up today he is doing very well. No chest pain, dyspnea or palpitations. He is active. No new complaints.     Past Medical History:  Diagnosis Date  . BPH (benign prostatic hyperplasia)   . CAD (coronary artery disease)    a. inf STEMI s/p BMS to mRCA (02/03/14)  b. inf STEMI s/p PCTA to RCA with slow reflow.  . Chronic diastolic CHF (congestive heart failure) (HCC)    a. 2D echo on 05/18/15 LVEF 55-60%, akinesis basal inferior wall, G2DD   . GERD (gastroesophageal reflux disease)   . Hearing loss   . Hemorrhoids   . HH (hiatus hernia)   . Ischemic  cardiomyopathy    a. ECHO 01/2014 EF 45-50% and akinesis of the entire inferior myocardium. Mild MR.   b. now resolved (EF 55-60% on 04/22/15)    . Metabolic syndrome   . OSA (obstructive sleep apnea)    a. mild not requiring CPAP at this time  . PAF (paroxysmal atrial fibrillation) (HCC)    a. brief episode after cardiac cath on 05/16/15- resolved after IV amiodarone.  . Tinnitus   . Varicose veins     Past Surgical History:  Procedure Laterality Date  . CARDIAC CATHETERIZATION N/A 05/16/2015   Procedure: Left Heart Cath and Coronary Angiography;  Surgeon: Lyn Records, MD;  Location[facility] INVASIVE CV LAB;  Service: Cardiovascular;  Laterality: N/A;  . CARDIAC CATHETERIZATION N/A 05/16/2015   Procedure: Coronary Stent Intervention;  Surgeon: Lyn Records, MD;  Location: [facility] INVASIVE CV LAB;  Service: Cardiovascular;  Laterality: N/A;  . CARPAL TUNNEL RELEASE Bilateral 03/02/2015   Procedure: BILATERAL CARPAL TUNNEL RELEASE ;  Surgeon: Cindee Salt, MD;  Location: [facility];  Service: Orthopedics;  Laterality: Bilateral;  . COLONOSCOPY  2006   Dr. Elnoria Howard  . CORONARY ANGIOPLASTY WITH STENT PLACEMENT  02/02/14   BMS to RCA  . ENDOVENOUS ABLATION SAPHENOUS VEIN W/ LASER Right 10-12-2014   EVLA right greater saphenous vein by Gretta Began MD  . LEFT HEART CATHETERIZATION  WITH CORONARY ANGIOGRAM N/A 02/02/2014   Procedure: LEFT HEART CATHETERIZATION WITH CORONARY ANGIOGRAM;  Surgeon: Hamzah Savoca M [place], MD;  Location: [facility] CATH LAB;  Service: Cardiovascular;  Laterality: N/A;  . stab phlebectomy  Right 03-15-2015   stab phlebectomy > 20 incisions (right leg) by Gretta Began MD    Current Medications: Current Meds  Medication Sig  . aspirin 81 MG tablet Take 81 mg by mouth daily.  [name] atorvastatin (LIPITOR) 80 MG tablet TAKE 1 TABLET(80 MG) BY MOUTH DAILY AT 6 PM  . BRILINTA 60 MG TABS tablet TAKE 1 TABLET(60 MG) BY MOUTH TWICE DAILY  . Melatonin 10 MG CAPS Take 10 mg by mouth at bedtime.  Takes daily  . metoprolol tartrate (LOPRESSOR) 25 MG tablet TAKE 1 TABLET(25 MG) BY MOUTH TWICE DAILY  . Multiple Vitamin (MULTIVITAMIN) tablet Take 1 tablet by mouth every evening.   . nitroGLYCERIN (NITROSTAT) 0.4 MG SL tablet Place 1 tablet (0.4 mg total) under the tongue every 5 (five) minutes as needed for chest pain. MAX 3 doses  . Nutritional Supplements (JUICE PLUS FIBRE PO) Take 1 tablet by mouth 2 (two) times daily. Fruit blend and vegetable blend  . Omega-3 Fatty Acids (FISH OIL) 1000 MG CAPS Take 1 capsule (1,000 mg total) by mouth 2 (two) times daily.  [name] omeprazole (PRILOSEC) 20 MG capsule Take 1 capsule (20 mg total) by mouth daily.  [name] omeprazole (PRILOSEC) 40 MG capsule Take 1 capsule (40 mg total) by mouth daily.  . tadalafil (CIALIS) 5 MG tablet Take 1 tablet (5 mg total) by mouth daily.  [name] zolpidem (AMBIEN) 5 MG tablet TAKE 1 TABLET BY MOUTH AT BEDTIME AS NEEDED FOR SLEEP     Allergies:   Ace inhibitors   Social History   Socioeconomic History  . Marital status: Married    Spouse name: Not on file  . Number of children: Not on file  . Years of education: Not on file  . Highest education level: Not on file  Occupational History  . Not on file  Tobacco Use  . Smoking status: Former Games developer  . Smokeless tobacco: Never Used  Vaping Use  . Vaping Use: Never used  Substance and Sexual Activity  . Alcohol use: Yes    Alcohol/week: 3.0 standard drinks    Types: 3 Glasses of wine per week  . Drug use: No  . Sexual activity: Not Currently  Other Topics Concern  . Not on file  Social History Narrative  . Not on file   Social Determinants of Health   Financial Resource Strain:   . Difficulty of Paying Living Expenses: Not on file  Food Insecurity:   . Worried About Programme researcher, broadcasting/film/video in the Last Year: Not on file  . Ran Out of Food in the Last Year: Not on file  Transportation Needs:   . Lack of Transportation (Medical): Not on file  . Lack of Transportation  (Non-Medical): Not on file  Physical Activity:   . Days of Exercise per Week: Not on file  . Minutes of Exercise per Session: Not on file  Stress:   . Feeling of Stress : Not on file  Social Connections:   . Frequency of Communication with Friends and Family: Not on file  . Frequency of Social Gatherings with Friends and Family: Not on file  . Attends Religious Services: Not on file  . Active Member of Clubs or Organizations: Not on file  . Attends Banker Meetings:  Not on file  . Marital Status: Not on file     Family History: The patient's family history includes Breast cancer in his mother; Cervical cancer in his mother; Hypertension in his mother; Skin cancer in his mother. There is no history of Colon cancer or Esophageal cancer.  ROS:   Please see the history of present illness.     All other systems reviewed and are negative.  EKGs/Labs/Other Studies Reviewed:    The following studies were reviewed today:  Cath 05/16/2015 1. Mid RCA to Dist RCA lesion, 10% stenosed. The lesion was previously treated with a stent (unknown type). 2. Prox RCA to Mid RCA lesion, 25% stenosed. 3. Prox LAD to Dist LAD lesion, 40% stenosed. 4. Dist RCA lesion, 100% stenosed. Post intervention, there is a 10% residual stenosis.   Acute inferior myocardial infarction presenting with cardiogenic shock.  PTCA of the distal RCA from 100% to less than 10%. The vessel is large and ectatic containing heavy thrombus. Reperfusion was complicated by severe "no reflow".  Widely patent left coronary system.  Intra-aortic balloon pump to improve diastolic flow and help resolve "no reflow".  Left ventriculography was not performed   RECOMMENDATIONS:   One-to-one intra-aortic balloon, pulsation for at least 24 hours.  Intravenous Levophed to keep a mean arterial pressure greater than 65 mmHg.  Intravenous Aggrastat for at least 24 hours  Aspirin and Brilinta indefinitely.  IV  amiodarone to treat acute atrial fibrillation.  Prognosis is guarded. This was discussed with the patient's family in full detail.  IV heparin without a bolus to start in 8 hours.  Beta blocker therapy as tolerated by blood pressure.  2-D Doppler echocardiogram 24-48 hours hence.  Unfortunately, the patient will probably have a rocky post infarct course with potential for brady- and ventricular arrhythmias.  EKG:  EKG is ordered today.  The ekg ordered today demonstrates normal sinus rhythm with Q waves in the inferior leads. I have personally reviewed and interpreted this study.   Recent Labs: 03/13/2020: ALT 27; BUN 14; Creatinine, Ser 1.22; Hemoglobin 15.3; Platelets 171; Potassium 4.8; Sodium 138  Recent Lipid Panel    Component Value Date/Time   CHOL 141 03/13/2020 1100   TRIG 89 03/13/2020 1100   HDL 52 03/13/2020 1100   CHOLHDL 2.7 03/13/2020 1100   CHOLHDL 2.9 02/19/2017 0843   VLDL 15 02/19/2017 0843   LDLCALC 72 03/13/2020 1100    Physical Exam:    VS:  BP 111/71   Pulse 71   Ht 5\' 6"  (1.676 m)   Wt 195 lb 3.2 oz (88.5 kg)   SpO2 97%   BMI 31.51 kg/m     Wt Readings from Last 3 Encounters:  03/15/20 195 lb 3.2 oz (88.5 kg)  03/13/20 197 lb 12.8 oz (89.7 kg)  12/12/19 195 lb 3.2 oz (88.5 kg)     GEN:  Well nourished, well developed in no acute distress HEENT: Normal NECK: No JVD; No carotid bruits LYMPHATICS: No lymphadenopathy CARDIAC: RRR, no murmurs, rubs, gallops RESPIRATORY:  Clear to auscultation without rales, wheezing or rhonchi  ABDOMEN: Soft, non-tender, non-distended MUSCULOSKELETAL:  No edema; No deformity  SKIN: Warm and dry NEUROLOGIC:  Alert and oriented x 3 PSYCHIATRIC:  Normal affect   ASSESSMENT:    No diagnosis found. PLAN:    In order of problems listed above:  1. CAD: s/p inferior MI x 2. Aneurysmal RCA. Denies any recent anginal symptoms.  Continue aspirin, Brilinta, Lipitor and metoprolol  2.  Hyperlipidemia: Recent lab  work showed improvement in triglycerides on fish oil. Continue statin.  3. Chronic diastolic heart failure: No sign of volume overload.   Medication Adjustments/Labs and Tests Ordered: Current medicines are reviewed at length with the patient today.  Concerns regarding medicines are outlined above.  No orders of the defined types were placed in this encounter.  No orders of the defined types were placed in this encounter.   There are no Patient Instructions on file for this visit.   Signed, Kyal Arts [place], MD  03/15/2020 4:26 PM    [facility]

## 2020-03-13 ENCOUNTER — Encounter: Payer: Self-pay | Admitting: Family Medicine

## 2020-03-13 ENCOUNTER — Ambulatory Visit: Payer: Medicare PPO | Admitting: Family Medicine

## 2020-03-13 ENCOUNTER — Other Ambulatory Visit: Payer: Self-pay

## 2020-03-13 VITALS — BP 110/72 | HR 65 | Temp 97.6°F | Ht 66.75 in | Wt 197.8 lb

## 2020-03-13 DIAGNOSIS — I83899 Varicose veins of unspecified lower extremities with other complications: Secondary | ICD-10-CM | POA: Diagnosis not present

## 2020-03-13 DIAGNOSIS — E8881 Metabolic syndrome: Secondary | ICD-10-CM

## 2020-03-13 DIAGNOSIS — L570 Actinic keratosis: Secondary | ICD-10-CM

## 2020-03-13 DIAGNOSIS — H9193 Unspecified hearing loss, bilateral: Secondary | ICD-10-CM

## 2020-03-13 DIAGNOSIS — G4733 Obstructive sleep apnea (adult) (pediatric): Secondary | ICD-10-CM

## 2020-03-13 DIAGNOSIS — N400 Enlarged prostate without lower urinary tract symptoms: Secondary | ICD-10-CM | POA: Diagnosis not present

## 2020-03-13 DIAGNOSIS — I5032 Chronic diastolic (congestive) heart failure: Secondary | ICD-10-CM

## 2020-03-13 DIAGNOSIS — E785 Hyperlipidemia, unspecified: Secondary | ICD-10-CM | POA: Diagnosis not present

## 2020-03-13 DIAGNOSIS — Z0000 Encounter for general adult medical examination without abnormal findings: Secondary | ICD-10-CM | POA: Diagnosis not present

## 2020-03-13 DIAGNOSIS — K219 Gastro-esophageal reflux disease without esophagitis: Secondary | ICD-10-CM | POA: Diagnosis not present

## 2020-03-13 DIAGNOSIS — I480 Paroxysmal atrial fibrillation: Secondary | ICD-10-CM

## 2020-03-13 DIAGNOSIS — N528 Other male erectile dysfunction: Secondary | ICD-10-CM

## 2020-03-13 MED ORDER — OMEPRAZOLE 40 MG PO CPDR: 40.0000 mg | DELAYED_RELEASE_CAPSULE | Freq: Every day | ORAL | 3 refills | Status: DC

## 2020-03-13 MED ORDER — TADALAFIL 5 MG PO TABS: 5.0000 mg | ORAL_TABLET | Freq: Every day | ORAL | 3 refills | Status: DC

## 2020-03-13 MED ORDER — ATORVASTATIN CALCIUM 80 MG PO TABS: ORAL_TABLET | ORAL | 3 refills | Status: DC

## 2020-03-13 NOTE — Patient Instructions (Signed)
  Mr. Rabalais , Thank you for taking time to come for your Medicare Wellness Visit. I appreciate your ongoing commitment to your health goals. Please review the following plan we discussed and let me know if I can assist you in the future.   These are the goals we discussed: Goals   None     This is a list of the screening recommended for you and due dates:  Health Maintenance  Topic Date Due  . Colon Cancer Screening  03/28/2025  . Tetanus Vaccine  07/10/2028  . Flu Shot  Completed  . COVID-19 Vaccine  Completed  .  Hepatitis C: One time screening is recommended by Center for Disease Control  (CDC) for  adults born from [birth year] through 1967.   Completed  . Pneumonia vaccines  Discontinued

## 2020-03-13 NOTE — Progress Notes (Signed)
Henry Rogers is a 69 y.o. male who presents for annual wellness visit and follow-up on chronic medical conditions.  He has no particular concerns or complaints.  He follows up regularly with cardiology.  He has had 2 cardiac events.  He did get stented with the first 1 however the second 1 was treated medically.  He does have evidence of occasional bouts of A. fib as well as chronic diastolic CHF.  He presently is taking Cialis for his BPH and has had some erectile dysfunction issues but is not using it for that purpose.  His reflux seems to be under good control.  He follows up regularly with dermatology for actinic keratoses.  He is wearing his hearing aids and finds them quite helpful.  Continues on his statin without difficulty.  There is a history of metabolic syndrome with him.  He has had a varicose veins treated and he is not having any difficulty at the present time.  He does have underlying OSA but is not using CPAP and is apparently doing quite nicely with no apneic episodes according to him and his wife.  He keeps himself physically active.  Otherwise family and social history is unchanged   Immunizations and Health Maintenance Immunization History  Administered Date(s) Administered  . Fluad Quad(high Dose 65+) 03/24/2019  . Influenza Split 05/24/2013  . Influenza, High Dose Seasonal PF 04/12/2014, 09/04/2015, 04/03/2018  . Influenza, Seasonal, Injecte, Preservative Fre 03/24/2019  . Influenza-Unspecified 04/07/2016, 04/01/2017, 06/01/2017, 07/10/2018  . PFIZER SARS-COV-2 Vaccination 08/27/2019, 09/21/2019  . Pneumococcal Conjugate-13 01/03/2015  . Pneumococcal Polysaccharide-23 01/16/2009  . Tdap 01/16/2009, 07/10/2018  . Zoster 01/16/2009  . Zoster Recombinat (Shingrix) 04/01/2017, 06/01/2017   Health Maintenance Due  Topic Date Due  . INFLUENZA VACCINE  02/19/2020    Last colonoscopy: 03/29/15 Last PSA: 02/19/17 Dentist: Ophtho: Exercise: walking , band 40 min 6 days  week  Other doctors caring for patient include: Dr. [place]  Cardio,  Dr. Elnoria Howard GI  Advanced Directives: Does Patient Have a Medical Advance Directive?: No, Yes Type of Advance Directive: Living will Does patient want to make changes to medical advance directive?: No - Patient declined Would patient like information on creating a medical advance directive?: No - Patient declined  Depression screen:  See questionnaire below.     Depression screen [facility] 2/9 03/13/2020 03/01/2019 02/25/2018 02/19/2017  Decreased Interest 0 0 0 0  Down, Depressed, Hopeless 0 0 0 0  PHQ - 2 Score 0 0 0 0  Some recent data might be hidden    Fall Screen: See Questionaire below.   Fall Risk  03/13/2020 03/01/2019 02/25/2018 02/19/2017 01/11/2016  Falls in the past year? 0 0 No No No  Risk for fall due to : No Fall Risks - - - -    ADL screen:  See questionnaire below.  Functional Status Survey: Is the patient deaf or have difficulty hearing?: Yes (hearing aide) Does the patient have difficulty seeing, even when wearing glasses/contacts?: No Does the patient have difficulty concentrating, remembering, or making decisions?: No Does the patient have difficulty walking or climbing stairs?: No Does the patient have difficulty dressing or bathing?: No Does the patient have difficulty doing errands alone such as visiting a doctor's office or shopping?: No   Review of Systems  Constitutional: -, -unexpected weight change, -anorexia, -fatigue Allergy: -sneezing, -itching, -congestion Dermatology: denies changing moles, rash, lumps ENT: -runny nose, -ear pain, -sore throat,  Cardiology:  -chest pain, -palpitations, -orthopnea, Respiratory: -cough, -  shortness of breath, -dyspnea on exertion, -wheezing,  Gastroenterology: -abdominal pain, -nausea, -vomiting, -diarrhea, -constipation, -dysphagia Hematology: -bleeding or bruising problems Musculoskeletal: -arthralgias, -myalgias, -joint swelling, -back pain,  - Ophthalmology: -vision changes,  Urology: -dysuria, -difficulty urinating,  -urinary frequency, -urgency, incontinence Neurology: -, -numbness, , -memory loss, -falls, -dizziness    PHYSICAL EXAM:   General Appearance: Alert, cooperative, no distress, appears stated age Head: Normocephalic, without obvious abnormality, atraumatic Eyes: PERRL, conjunctiva/corneas clear, EOM's intact,  Ears: Normal TM's and external ear canals Nose: Nares normal, mucosa normal, no drainage or sinus   tenderness Throat: Lips, mucosa, and tongue normal; teeth and gums normal Neck: Supple, no lymphadenopathy, thyroid:no enlargement/tenderness/nodules; no carotid bruit or JVD Lungs: Clear to auscultation bilaterally without wheezes, rales or ronchi; respirations unlabored Heart: Regular rate and rhythm, S1 and S2 normal, no murmur, rub or gallop Abdomen: Soft, non-tender, nondistended, normoactive bowel sounds, no masses, no hepatosplenomegaly Extremities: No clubbing, cyanosis or edema Pulses: 2+ and symmetric all extremities Skin: Skin color, texture, turgor normal, no rashes or lesions Lymph nodes: Cervical, supraclavicular, and axillary nodes normal Neurologic: CNII-XII intact, normal strength, sensation and gait; reflexes 2+ and symmetric throughout   Psych: Normal mood, affect, hygiene and grooming  ASSESSMENT/PLAN: Routine general medical examination at a health care facility - Plan: Comprehensive metabolic panel, CBC with Differential/Platelet, Lipid panel  Gastroesophageal reflux disease, unspecified whether esophagitis present  Benign prostatic hyperplasia without lower urinary tract symptoms - Plan: PSA  Actinic keratosis  Hyperlipidemia LDL goal <70  Bilateral hearing loss, unspecified hearing loss type  Other male erectile dysfunction  Metabolic syndrome  Varicose veins of leg with complications  Chronic diastolic CHF (congestive heart failure) (HCC)  PAF (paroxysmal atrial  fibrillation) (HCC)  OSA (obstructive sleep apnea) He will continue on his present medication regimen.  No intervention needed for the OSA.  The metabolic syndrome is being cared for adequately.  Did discuss the use of Cialis in a 20 mg dosing to see if it can help with erections when the opportunity arises.     Immunization recommendations discussed.  Colonoscopy recommendations reviewed.   Medicare Attestation I have personally reviewed: The patient's medical and social history Their use of alcohol, tobacco or illicit drugs Their current medications and supplements The patient's functional ability including ADLs,fall risks, home safety risks, cognitive, and hearing and visual impairment Diet and physical activities Evidence for depression or mood disorders  The patient's weight, height, and BMI have been recorded in the chart.  I have made referrals, counseling, and provided education to the patient based on review of the above and I have provided the patient with a written personalized care plan for preventive services.     Sharlot Gowda, MD   03/13/2020

## 2020-03-14 LAB — LIPID PANEL
Chol/HDL Ratio: 2.7 ratio (ref 0.0–5.0)
Cholesterol, Total: 141 mg/dL (ref 100–199)
HDL: 52 mg/dL (ref >39)
LDL Chol Calc (NIH): 72 mg/dL (ref 0–99)
Triglycerides: 89 mg/dL (ref 0–149)
VLDL Cholesterol Cal: 17 mg/dL (ref 5–40)

## 2020-03-14 LAB — COMPREHENSIVE METABOLIC PANEL
ALT: 27 IU/L (ref 0–44)
AST: 23 IU/L (ref 0–40)
Albumin/Globulin Ratio: 2.2 (ref 1.2–2.2)
Albumin: 4.6 g/dL (ref 3.7–4.7)
Alkaline Phosphatase: 74 IU/L (ref 48–121)
BUN/Creatinine Ratio: 11 (ref 10–24)
BUN: 14 mg/dL (ref 8–27)
Bilirubin Total: 0.7 mg/dL (ref 0.0–1.2)
CO2: 26 mmol/L (ref 20–29)
Calcium: 9.8 mg/dL (ref 8.6–10.2)
Chloride: 100 mmol/L (ref 96–106)
Creatinine, Ser: 1.22 mg/dL (ref 0.76–1.27)
GFR calc Af Amer: 69 mL/min/{1.73_m2} (ref >59)
GFR calc non Af Amer: 59 mL/min/{1.73_m2} — ABNORMAL LOW (ref >59)
Globulin, Total: 2.1 g/dL (ref 1.5–4.5)
Glucose: 106 mg/dL — ABNORMAL HIGH (ref 65–99)
Potassium: 4.8 mmol/L (ref 3.5–5.2)
Sodium: 138 mmol/L (ref 134–144)
Total Protein: 6.7 g/dL (ref 6.0–8.5)

## 2020-03-14 LAB — CBC WITH DIFFERENTIAL/PLATELET
Basophils Absolute: 0 10*3/uL (ref 0.0–0.2)
Basos: 0 %
EOS (ABSOLUTE): 0.1 10*3/uL (ref 0.0–0.4)
Eos: 1 %
Hematocrit: 46 % (ref 37.5–51.0)
Hemoglobin: 15.3 g/dL (ref 13.0–17.7)
Immature Grans (Abs): 0 10*3/uL (ref 0.0–0.1)
Immature Granulocytes: 0 %
Lymphocytes Absolute: 1.3 10*3/uL (ref 0.7–3.1)
Lymphs: 21 %
MCH: 32.1 pg (ref 26.6–33.0)
MCHC: 33.3 g/dL (ref 31.5–35.7)
MCV: 96 fL (ref 79–97)
Monocytes Absolute: 0.5 10*3/uL (ref 0.1–0.9)
Monocytes: 8 %
Neutrophils Absolute: 4.1 10*3/uL (ref 1.4–7.0)
Neutrophils: 70 %
Platelets: 171 10*3/uL (ref 150–450)
RBC: 4.77 x10E6/uL (ref 4.14–5.80)
RDW: 12.8 % (ref 11.6–15.4)
WBC: 5.9 10*3/uL (ref 3.4–10.8)

## 2020-03-14 LAB — PSA: Prostate Specific Ag, Serum: 0.5 ng/mL (ref 0.0–4.0)

## 2020-03-15 ENCOUNTER — Ambulatory Visit: Payer: Medicare PPO | Admitting: Cardiology

## 2020-03-15 ENCOUNTER — Encounter: Payer: Self-pay | Admitting: Cardiology

## 2020-03-15 ENCOUNTER — Other Ambulatory Visit: Payer: Self-pay

## 2020-03-15 VITALS — BP 111/71 | HR 71 | Ht 66.0 in | Wt 195.2 lb

## 2020-03-15 DIAGNOSIS — E785 Hyperlipidemia, unspecified: Secondary | ICD-10-CM

## 2020-03-15 DIAGNOSIS — I2510 Atherosclerotic heart disease of native coronary artery without angina pectoris: Secondary | ICD-10-CM | POA: Diagnosis not present

## 2020-04-05 LAB — HGB A1C W/O EAG: Hgb A1c MFr Bld: 5.9 % — ABNORMAL HIGH (ref 4.8–5.6)

## 2020-04-05 LAB — SPECIMEN STATUS REPORT

## 2020-04-27 ENCOUNTER — Ambulatory Visit (INDEPENDENT_AMBULATORY_CARE_PROVIDER_SITE_OTHER): Payer: Medicare PPO | Admitting: Medical

## 2020-04-27 ENCOUNTER — Other Ambulatory Visit: Payer: Self-pay

## 2020-04-27 ENCOUNTER — Encounter: Payer: Self-pay | Admitting: Medical

## 2020-04-27 VITALS — BP 122/84 | HR 67 | Temp 97.9°F | Wt 197.0 lb

## 2020-04-27 DIAGNOSIS — K921 Melena: Secondary | ICD-10-CM

## 2020-04-27 DIAGNOSIS — Z7901 Long term (current) use of anticoagulants: Secondary | ICD-10-CM

## 2020-04-27 DIAGNOSIS — K5730 Diverticulosis of large intestine without perforation or abscess without bleeding: Secondary | ICD-10-CM

## 2020-04-27 LAB — CBC WITH DIFFERENTIAL/PLATELET
Basophils Absolute: 0 10*3/uL (ref 0.0–0.2)
Basos: 0 %
EOS (ABSOLUTE): 0.1 10*3/uL (ref 0.0–0.4)
Eos: 2 %
Hematocrit: 44 % (ref 37.5–51.0)
Hemoglobin: 15.1 g/dL (ref 13.0–17.7)
Immature Grans (Abs): 0 10*3/uL (ref 0.0–0.1)
Immature Granulocytes: 0 %
Lymphocytes Absolute: 1.4 10*3/uL (ref 0.7–3.1)
Lymphs: 27 %
MCH: 32.1 pg (ref 26.6–33.0)
MCHC: 34.3 g/dL (ref 31.5–35.7)
MCV: 94 fL (ref 79–97)
Monocytes Absolute: 0.5 10*3/uL (ref 0.1–0.9)
Monocytes: 10 %
Neutrophils Absolute: 3.2 10*3/uL (ref 1.4–7.0)
Neutrophils: 61 %
Platelets: 180 10*3/uL (ref 150–450)
RBC: 4.7 x10E6/uL (ref 4.14–5.80)
RDW: 12.3 % (ref 11.6–15.4)
WBC: 5.3 10*3/uL (ref 3.4–10.8)

## 2020-04-27 NOTE — Progress Notes (Signed)
Established patient visit   Patient: Henry Rogers   DOB: [birth year]   69 y.o. Male  MRN: [id number] Visit Date: 04/27/2020  Today's healthcare provider: Kristian Covey, PA-C   Chief Complaint  Patient presents with  . other    bleeding in the rectum night before last had bleeding from rectum   I,Porsha McClurkin,acting as a scribe for [employer], PA-C.,have documented all relevant documentation on his behalf,as directed and in the presence of Shane Johnson [place],PA-C.  Subjective    HPI HPI    other     Additional comments: bleeding in the rectum night before last had bleeding from rectum        Last edited by Fredirick Maudlin, RMA on 04/27/2020  9:56 AM. (History)      Rectal Bleeding Patient reports he saw a blood spot on his bed sheet last night, 04/26/2020 the size of a 50 cent piece. Patient reports he exam his self and noticed blood in his pajama pants. He report blood in stool years ago. He reports been on Brilinta since 2016. He denies any pain.  No dizziness, no pain, no lightheadedness, denies any regular blood in the stool and no significant history of hemorrhoids.  Otherwise normal state of health.  No other aggravating or relieving factors. No other complaint.    Medications: Outpatient Medications Prior to Visit  Medication Sig  . aspirin 81 MG tablet Take 81 mg by mouth daily.  [name] atorvastatin (LIPITOR) 80 MG tablet TAKE 1 TABLET(80 MG) BY MOUTH DAILY AT 6 PM  . BRILINTA 60 MG TABS tablet TAKE 1 TABLET(60 MG) BY MOUTH TWICE DAILY  . Melatonin 10 MG CAPS Take 10 mg by mouth at bedtime. Takes daily  . metoprolol tartrate (LOPRESSOR) 25 MG tablet TAKE 1 TABLET(25 MG) BY MOUTH TWICE DAILY  . Multiple Vitamin (MULTIVITAMIN) tablet Take 1 tablet by mouth every evening.   . nitroGLYCERIN (NITROSTAT) 0.4 MG SL tablet Place 1 tablet (0.4 mg total) under the tongue every 5 (five) minutes as needed for chest pain. MAX 3 doses  . Nutritional Supplements (JUICE PLUS FIBRE  PO) Take 1 tablet by mouth 2 (two) times daily. Fruit blend and vegetable blend  . Omega-3 Fatty Acids (FISH OIL) 1000 MG CAPS Take 1 capsule (1,000 mg total) by mouth 2 (two) times daily.  [name] omeprazole (PRILOSEC) 20 MG capsule Take 1 capsule (20 mg total) by mouth daily.  [name] omeprazole (PRILOSEC) 40 MG capsule Take 1 capsule (40 mg total) by mouth daily.  . tadalafil (CIALIS) 5 MG tablet Take 1 tablet (5 mg total) by mouth daily.  [name] zolpidem (AMBIEN) 5 MG tablet TAKE 1 TABLET BY MOUTH AT BEDTIME AS NEEDED FOR SLEEP   No facility-administered medications prior to visit.    Review of Systems  As in subjective    Objective    BP 122/84   Pulse 67   Temp 97.9 F (36.6 C)   Wt 197 lb (89.4 kg)   BMI 31.80 kg/m    Physical Exam  General: Well-developed well-nourished no acute distress, white male Rectal: There is small hemorrhoidal tissue externally but no obvious bleeding or swelling, rectal tone normal, anoscopy revealed an inflamed/friable hemorrhoid internally but no other abnormality Abdomen nontender no mass no organomegaly, positive bowel sounds Skin warm dry, no pallor Oral: No obvious bleeding or other lesions, MMM  Exam chaperoned by nurse     Assessment & Plan   Encounter Diagnoses  Name Primary?  [name]  Blood in stool Yes  . Current use of long term anticoagulation   . Diverticula of colon        We discussed his symptoms and concerns.  We discussed possible differential which could include adverse reaction of being on both Brilinta and aspirin, possible internal hemorrhoids, other bleeding lesion.   He does have history of diverticular of the colon without history of diverticulitis based on 2016 colonoscopy with Dr. Elnoria Howard.  Advised we will check a CBC blood count today.  We will plan to do rectal exam and possible anoscopy.  Advised over the weekend if he gets additional blood to stop aspirin temporarily.  Continue Brilinta and aspirin for now though.    Montrice was  seen today for other.  Diagnoses and all orders for this visit:  Blood in stool -     CBC with Differential/Platelet -     PR DIAGNOSTIC ANOSCOPY  Current use of long term anticoagulation -     CBC with Differential/Platelet  Diverticula of colon   Follow-up pending blood count

## 2020-04-30 ENCOUNTER — Encounter: Payer: Self-pay | Admitting: Family Medicine

## 2020-05-01 ENCOUNTER — Other Ambulatory Visit: Payer: Self-pay

## 2020-05-01 ENCOUNTER — Ambulatory Visit: Payer: Medicare PPO | Admitting: Family Medicine

## 2020-05-01 ENCOUNTER — Encounter: Payer: Self-pay | Admitting: Family Medicine

## 2020-05-01 VITALS — BP 112/72 | HR 88 | Temp 98.8°F | Wt 193.8 lb

## 2020-05-01 DIAGNOSIS — G8929 Other chronic pain: Secondary | ICD-10-CM

## 2020-05-01 DIAGNOSIS — M25562 Pain in left knee: Secondary | ICD-10-CM

## 2020-05-01 DIAGNOSIS — Z20822 Contact with and (suspected) exposure to covid-19: Secondary | ICD-10-CM | POA: Diagnosis not present

## 2020-05-01 MED ORDER — BUPIVACAINE HCL 0.5 % IJ SOLN: 50.0000 mL | Freq: Once | INTRAMUSCULAR | Status: DC

## 2020-05-01 MED ORDER — TRIAMCINOLONE ACETONIDE 40 MG/ML IJ SUSP: 40.0000 mg | Freq: Once | INTRAMUSCULAR | Status: DC

## 2020-05-01 NOTE — Progress Notes (Signed)
   Subjective:    Patient ID: Henry Rogers, male    DOB: [birth year], 69 y.o.   MRN: [id number]  HPI He is here for an injection of his left knee.  He did have this done several months ago however he has a trip planned to [place] which will require a lot of walking.  He has been doing rehab and using OTC pain meds but not getting good relief.  He would like to have an injection.   Review of Systems     Objective:   Physical Exam Alert and in no distress otherwise not examined       Assessment & Plan:  Chronic pain of left knee The knee was prepped laterally.  The joint line was identified.  Betadine used.  40 mg of Kenalog and 3 cc of Xylocaine was injected into the joint without difficulty.  He tolerated this well.

## 2020-05-01 NOTE — Addendum Note (Signed)
Addended by: Renelda Loma on: 05/01/2020 05:45 PM   Modules accepted: Orders

## 2020-05-03 ENCOUNTER — Encounter: Payer: Self-pay | Admitting: Family Medicine

## 2020-06-06 DIAGNOSIS — D179 Benign lipomatous neoplasm, unspecified: Secondary | ICD-10-CM | POA: Diagnosis not present

## 2020-06-06 DIAGNOSIS — Z86018 Personal history of other benign neoplasm: Secondary | ICD-10-CM | POA: Diagnosis not present

## 2020-06-06 DIAGNOSIS — Z808 Family history of malignant neoplasm of other organs or systems: Secondary | ICD-10-CM | POA: Diagnosis not present

## 2020-06-06 DIAGNOSIS — L578 Other skin changes due to chronic exposure to nonionizing radiation: Secondary | ICD-10-CM | POA: Diagnosis not present

## 2020-06-06 DIAGNOSIS — L821 Other seborrheic keratosis: Secondary | ICD-10-CM | POA: Diagnosis not present

## 2020-06-06 DIAGNOSIS — D225 Melanocytic nevi of trunk: Secondary | ICD-10-CM | POA: Diagnosis not present

## 2020-06-06 DIAGNOSIS — L570 Actinic keratosis: Secondary | ICD-10-CM | POA: Diagnosis not present

## 2020-06-06 DIAGNOSIS — D2272 Melanocytic nevi of left lower limb, including hip: Secondary | ICD-10-CM | POA: Diagnosis not present

## 2020-06-06 DIAGNOSIS — D235 Other benign neoplasm of skin of trunk: Secondary | ICD-10-CM | POA: Diagnosis not present

## 2020-07-04 DIAGNOSIS — H903 Sensorineural hearing loss, bilateral: Secondary | ICD-10-CM | POA: Diagnosis not present

## 2020-07-19 DIAGNOSIS — Z1159 Encounter for screening for other viral diseases: Secondary | ICD-10-CM | POA: Diagnosis not present

## 2020-08-07 DIAGNOSIS — L988 Other specified disorders of the skin and subcutaneous tissue: Secondary | ICD-10-CM | POA: Diagnosis not present

## 2020-08-07 DIAGNOSIS — D485 Neoplasm of uncertain behavior of skin: Secondary | ICD-10-CM | POA: Diagnosis not present

## 2020-08-27 ENCOUNTER — Telehealth: Payer: Self-pay

## 2020-08-27 NOTE — Telephone Encounter (Signed)
P.A. TADALAFIL submitted

## 2020-08-29 ENCOUNTER — Telehealth: Payer: Self-pay

## 2020-08-29 MED ORDER — OMEGA-3-ACID ETHYL ESTERS 1 G PO CAPS: 2.0000 g | ORAL_CAPSULE | Freq: Two times a day (BID) | ORAL | 3 refills | Status: DC

## 2020-08-29 NOTE — Telephone Encounter (Signed)
Pt has been taking wife's omega-3 acid ethyl esters (LOVAZA) 1 g capsule for past year or so at the recommendation of his cardiologist to take fish oil & he likes it because there is no after taste and no side effects.   He would like a Rx sent in for the this.

## 2020-08-31 NOTE — Telephone Encounter (Signed)
P.A. approved til 07/20/2021 pt informed

## 2020-09-15 NOTE — Progress Notes (Signed)
Cardiology Office Note:    Date:  09/17/2020   ID:  YOGI COLLA, DOB September 10, 1950, MRN [id number]  PCP:  Ronnald Nian, MD  Cardiologist:  Mariah Harn [place], MD  Electrophysiologist:  None   Referring MD: Ronnald Nian, MD   Chief Complaint  Patient presents with  . Coronary Artery Disease    History of Present Illness:    Henry Rogers is a 70 y.o. male with a hx of HLD, OSA, chronic diastolic heart failure and CAD.  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  He also had a cardiogenic shock.  RCA was stented with a 5 x 16 mm Veriflex stent postdilated to 6 mm.  EF was 45 to 50%.  The other coronary vessels did not have significant disease.  Repeat echocardiogram in September 2015 showed LV function was normalized to 55 to 60%.  He was readmitted in October 2016 with recurrent inferior infarction again associated with cardiogenic shock.  The stent in the mid RCA was patent however distal RCA was occluded was heavy thrombus.  This was treated with balloon angioplasty.  He had a transient episode of paroxysmal atrial fibrillation after the cardiac catheterization, this was resolved after IV amiodarone and has not recurred since.  He is not on any systemic anticoagulation due to the brief episodes without recurrence. He is on chronic DAPT.  On follow up today he is doing very well. No chest pain, dyspnea or palpitations. He is active. No new complaints. He did go on a trip this past year to [place] on a river cruise. Has managed to avoid Covid so far.     Past Medical History:  Diagnosis Date  . BPH (benign prostatic hyperplasia)   . CAD (coronary artery disease)    a. inf STEMI s/p BMS to mRCA (02/03/14)  b. inf STEMI s/p PCTA to RCA with slow reflow.  . Chronic diastolic CHF (congestive heart failure) (HCC)    a. 2D echo on 05/18/15 LVEF 55-60%, akinesis basal inferior wall, G2DD   . GERD (gastroesophageal  reflux disease)   . Hearing loss   . Hemorrhoids   . HH (hiatus hernia)   . Ischemic cardiomyopathy    a. ECHO 01/2014 EF 45-50% and akinesis of the entire inferior myocardium. Mild MR.   b. now resolved (EF 55-60% on 04/22/15)    . Metabolic syndrome   . OSA (obstructive sleep apnea)    a. mild not requiring CPAP at this time  . PAF (paroxysmal atrial fibrillation) (HCC)    a. brief episode after cardiac cath on 05/16/15- resolved after IV amiodarone.  . Tinnitus   . Varicose veins     Past Surgical History:  Procedure Laterality Date  . CARDIAC CATHETERIZATION N/A 05/16/2015   Procedure: Left Heart Cath and Coronary Angiography;  Surgeon: Lyn Records, MD;  Location: [facility] INVASIVE CV LAB;  Service: Cardiovascular;  Laterality: N/A;  . CARDIAC CATHETERIZATION N/A 05/16/2015   Procedure: Coronary Stent Intervention;  Surgeon: Lyn Records, MD;  Location: [facility] INVASIVE CV LAB;  Service: Cardiovascular;  Laterality: N/A;  . CARPAL TUNNEL RELEASE Bilateral 03/02/2015   Procedure: BILATERAL CARPAL TUNNEL RELEASE ;  Surgeon: Cindee Salt, MD;  Location:  SURGERY CENTER;  Service: Orthopedics;  Laterality: Bilateral;  . COLONOSCOPY  2006   Dr. Elnoria Howard  . CORONARY ANGIOPLASTY WITH STENT PLACEMENT  02/02/14   BMS to RCA  . ENDOVENOUS ABLATION  SAPHENOUS VEIN W/ LASER Right 10-12-2014   EVLA right greater saphenous vein by Gretta Began MD  . LEFT HEART CATHETERIZATION WITH CORONARY ANGIOGRAM N/A 02/02/2014   Procedure: LEFT HEART CATHETERIZATION WITH CORONARY ANGIOGRAM;  Surgeon: Erline Siddoway M [place], MD;  Location: [facility] CATH LAB;  Service: Cardiovascular;  Laterality: N/A;  . stab phlebectomy  Right 03-15-2015   stab phlebectomy > 20 incisions (right leg) by Gretta Began MD    Current Medications: Current Meds  Medication Sig  . aspirin 81 MG tablet Take 81 mg by mouth daily.  [name] atorvastatin (LIPITOR) 80 MG tablet TAKE 1 TABLET(80 MG) BY MOUTH DAILY AT 6 PM  . BRILINTA 60 MG TABS tablet TAKE 1  TABLET(60 MG) BY MOUTH TWICE DAILY  . Melatonin 10 MG CAPS Take 10 mg by mouth at bedtime. Takes daily  . metoprolol tartrate (LOPRESSOR) 25 MG tablet TAKE 1 TABLET(25 MG) BY MOUTH TWICE DAILY  . Multiple Vitamin (MULTIVITAMIN) tablet Take 1 tablet by mouth every evening.   . nitroGLYCERIN (NITROSTAT) 0.4 MG SL tablet Place 1 tablet (0.4 mg total) under the tongue every 5 (five) minutes as needed for chest pain. MAX 3 doses  . Nutritional Supplements (JUICE PLUS FIBRE PO) Take 1 tablet by mouth 2 (two) times daily. Fruit blend and vegetable blend  . omega-3 acid ethyl esters (LOVAZA) 1 g capsule Take 2 capsules (2 g total) by mouth 2 (two) times daily.  [name] omeprazole (PRILOSEC) 20 MG capsule Take 1 capsule (20 mg total) by mouth daily.  [name] omeprazole (PRILOSEC) 40 MG capsule Take 1 capsule (40 mg total) by mouth daily.  . tadalafil (CIALIS) 5 MG tablet Take 1 tablet (5 mg total) by mouth daily.  [name] zolpidem (AMBIEN) 5 MG tablet TAKE 1 TABLET BY MOUTH AT BEDTIME AS NEEDED FOR SLEEP   Current Facility-Administered Medications for the 09/17/20 encounter (Office Visit) with [place], Javontay Vandam M, MD  Medication  . bupivacaine (MARCAINE) 0.5 % (with pres) injection 50 mL  . triamcinolone acetonide (KENALOG-40) injection 40 mg     Allergies:   Ace inhibitors   Social History   Socioeconomic History  . Marital status: Married    Spouse name: Not on file  . Number of children: Not on file  . Years of education: Not on file  . Highest education level: Not on file  Occupational History  . Not on file  Tobacco Use  . Smoking status: Former Games developer  . Smokeless tobacco: Never Used  Vaping Use  . Vaping Use: Never used  Substance and Sexual Activity  . Alcohol use: Yes    Alcohol/week: 3.0 standard drinks    Types: 3 Glasses of wine per week  . Drug use: No  . Sexual activity: Not Currently  Other Topics Concern  . Not on file  Social History Narrative  . Not on file   Social Determinants of  Health   Financial Resource Strain: Not on file  Food Insecurity: Not on file  Transportation Needs: Not on file  Physical Activity: Not on file  Stress: Not on file  Social Connections: Not on file     Family History: The patient's family history includes Breast cancer in his mother; Cervical cancer in his mother; Hypertension in his mother; Skin cancer in his mother. There is no history of Colon cancer or Esophageal cancer.  ROS:   Please see the history of present illness.     All other systems reviewed and are negative.  EKGs/Labs/Other Studies  Reviewed:    The following studies were reviewed today:  Cath 05/16/2015 1. Mid RCA to Dist RCA lesion, 10% stenosed. The lesion was previously treated with a stent (unknown type). 2. Prox RCA to Mid RCA lesion, 25% stenosed. 3. Prox LAD to Dist LAD lesion, 40% stenosed. 4. Dist RCA lesion, 100% stenosed. Post intervention, there is a 10% residual stenosis.   Acute inferior myocardial infarction presenting with cardiogenic shock.  PTCA of the distal RCA from 100% to less than 10%. The vessel is large and ectatic containing heavy thrombus. Reperfusion was complicated by severe "no reflow".  Widely patent left coronary system.  Intra-aortic balloon pump to improve diastolic flow and help resolve "no reflow".  Left ventriculography was not performed   RECOMMENDATIONS:   One-to-one intra-aortic balloon, pulsation for at least 24 hours.  Intravenous Levophed to keep a mean arterial pressure greater than 65 mmHg.  Intravenous Aggrastat for at least 24 hours  Aspirin and Brilinta indefinitely.  IV amiodarone to treat acute atrial fibrillation.  Prognosis is guarded. This was discussed with the patient's family in full detail.  IV heparin without a bolus to start in 8 hours.  Beta blocker therapy as tolerated by blood pressure.  2-D Doppler echocardiogram 24-48 hours hence.  Unfortunately, the patient will probably  have a rocky post infarct course with potential for brady- and ventricular arrhythmias.  EKG:  EKG is not ordered today.     Recent Labs: 03/13/2020: ALT 27; BUN 14; Creatinine, Ser 1.22; Potassium 4.8; Sodium 138 04/27/2020: Hemoglobin 15.1; Platelets 180  Recent Lipid Panel    Component Value Date/Time   CHOL 141 03/13/2020 1100   TRIG 89 03/13/2020 1100   HDL 52 03/13/2020 1100   CHOLHDL 2.7 03/13/2020 1100   CHOLHDL 2.9 02/19/2017 0843   VLDL 15 02/19/2017 0843   LDLCALC 72 03/13/2020 1100    Physical Exam:    VS:  BP (!) 142/82   Pulse 76   Ht 5\' 6"  (1.676 m)   Wt 197 lb (89.4 kg)   SpO2 96%   BMI 31.80 kg/m     Wt Readings from Last 3 Encounters:  09/17/20 197 lb (89.4 kg)  05/01/20 193 lb 12.8 oz (87.9 kg)  04/27/20 197 lb (89.4 kg)     GEN:  Well nourished, well developed in no acute distress HEENT: Normal NECK: No JVD; No carotid bruits LYMPHATICS: No lymphadenopathy CARDIAC: RRR, no murmurs, rubs, gallops RESPIRATORY:  Clear to auscultation without rales, wheezing or rhonchi  ABDOMEN: Soft, non-tender, non-distended MUSCULOSKELETAL:  No edema; No deformity  SKIN: Warm and dry NEUROLOGIC:  Alert and oriented x 3 PSYCHIATRIC:  Normal affect   ASSESSMENT:    1. Coronary artery disease involving native coronary artery of native heart without angina pectoris   2. Hyperlipidemia LDL goal <70    PLAN:    In order of problems listed above:  1. CAD: s/p inferior MI x 2. Aneurysmal RCA. Denies any recent anginal symptoms.  Continue aspirin, Brilinta, Lipitor and metoprolol  2. Hyperlipidemia: at goal.  Continue statin.  3. Chronic diastolic heart failure: No sign of volume overload.  Follow up in 6 months   Medication Adjustments/Labs and Tests Ordered: Current medicines are reviewed at length with the patient today.  Concerns regarding medicines are outlined above.  No orders of the defined types were placed in this encounter.  No orders of the  defined types were placed in this encounter.   There are no Patient Instructions  on file for this visit.   Signed, Aki Abalos [place], MD  09/17/2020 3:13 PM    [facility]

## 2020-09-17 ENCOUNTER — Encounter: Payer: Self-pay | Admitting: Cardiology

## 2020-09-17 ENCOUNTER — Other Ambulatory Visit: Payer: Self-pay

## 2020-09-17 ENCOUNTER — Ambulatory Visit: Payer: Medicare PPO | Admitting: Cardiology

## 2020-09-17 VITALS — BP 142/82 | HR 76 | Ht 66.0 in | Wt 197.0 lb

## 2020-09-17 DIAGNOSIS — I2510 Atherosclerotic heart disease of native coronary artery without angina pectoris: Secondary | ICD-10-CM | POA: Diagnosis not present

## 2020-09-17 DIAGNOSIS — E785 Hyperlipidemia, unspecified: Secondary | ICD-10-CM

## 2020-10-17 DIAGNOSIS — H10413 Chronic giant papillary conjunctivitis, bilateral: Secondary | ICD-10-CM | POA: Diagnosis not present

## 2020-10-17 DIAGNOSIS — H43813 Vitreous degeneration, bilateral: Secondary | ICD-10-CM | POA: Diagnosis not present

## 2020-10-17 DIAGNOSIS — H2513 Age-related nuclear cataract, bilateral: Secondary | ICD-10-CM | POA: Diagnosis not present

## 2020-10-17 DIAGNOSIS — H04123 Dry eye syndrome of bilateral lacrimal glands: Secondary | ICD-10-CM | POA: Diagnosis not present

## 2020-10-17 DIAGNOSIS — G4730 Sleep apnea, unspecified: Secondary | ICD-10-CM | POA: Diagnosis not present

## 2020-10-29 ENCOUNTER — Telehealth: Payer: Medicare PPO | Admitting: Family Medicine

## 2020-10-29 ENCOUNTER — Encounter: Payer: Self-pay | Admitting: Family Medicine

## 2020-10-29 VITALS — HR 87 | Temp 97.9°F | Ht 66.0 in | Wt 185.0 lb

## 2020-10-29 DIAGNOSIS — K219 Gastro-esophageal reflux disease without esophagitis: Secondary | ICD-10-CM

## 2020-10-29 NOTE — Progress Notes (Signed)
   Subjective:    Patient ID: Henry Rogers, male    DOB: 1950/10/10, 70 y.o.   MRN: [id number]  HPI Documentation for virtual audio and video telecommunications through Caregility encounter: The patient was located at home. 2 patient identifiers used.  The provider was located in the office. The patient did consent to this visit and is aware of possible charges through their insurance for this visit. The other persons participating in this telemedicine service were none. Time spent on call was 5 minutes and in review of previous records >20 minutes total for counseling and coordination of care. This virtual service is not related to other E/M service within previous 7 days. This visit is to discuss GERD.  He was encouraged to stop his PPI due to potential side effects.  He has been cutting back on this tapering off the Prilosec.  He is added some homeopathic medications which he thinks helped slightly.  He also notes that an apple at bedtime helps with his reflux type symptoms.  He did have an endoscopy in 2019 and was essentially negative.  Review of Systems     Objective:   Physical Exam Alert and in no distress otherwise not examined       Assessment & Plan:  Gastroesophageal reflux disease without esophagitis I recommend he continue with the homeopathic medication he can also take the apple per day. I then discussed using Axid or Pepcid into twice per day dosing for a week or so to get symptoms under control and then back off to 1 twice per day and then once daily trying to achieve minimum effective dosing for control of symptoms.  If this does not work we can refer to gastroenterology for follow-up.  If it does since he had a colonoscopy in 2019 I think we can hold off on an endoscopy.  He was comfortable with that.

## 2020-11-20 ENCOUNTER — Ambulatory Visit (INDEPENDENT_AMBULATORY_CARE_PROVIDER_SITE_OTHER): Payer: Medicare PPO

## 2020-11-20 DIAGNOSIS — Z23 Encounter for immunization: Secondary | ICD-10-CM | POA: Diagnosis not present

## 2020-11-29 ENCOUNTER — Other Ambulatory Visit: Payer: Self-pay

## 2020-11-29 ENCOUNTER — Telehealth: Payer: Medicare PPO | Admitting: Family Medicine

## 2020-11-29 VITALS — Temp 98.0°F | Ht 66.0 in | Wt 185.0 lb

## 2020-11-29 DIAGNOSIS — K219 Gastro-esophageal reflux disease without esophagitis: Secondary | ICD-10-CM

## 2020-11-29 DIAGNOSIS — K449 Diaphragmatic hernia without obstruction or gangrene: Secondary | ICD-10-CM | POA: Diagnosis not present

## 2020-11-29 NOTE — Progress Notes (Signed)
   Subjective:    Patient ID: Henry Rogers, male    DOB: June 10, 1951, 70 y.o.   MRN: [id number]  HPI Documentation for virtual audio and video telecommunications through Caregility encounter: The patient was located at home. 2 patient identifiers used.  The provider was located in the office. The patient did consent to this visit and is aware of possible charges through their insurance for this visit. The other persons participating in this telemedicine service were none. Time spent on call was 5 minutes and in review of previous records >20 minutes total for counseling and coordination of care. This virtual service is not related to other E/M service within previous 7 days Today's visit is for further discussion of continued difficulty with reflux symptoms.  The original plan was to try getting off of PPI to prevent potential side effects from that.  He was switched to Pepcid and increase his dosing to 20 mg in the morning and 10 at night but still having difficulty with that.  Prior to that he was getting Prilosec 40 mg and 10 at night but still having difficulty.  He also has a history of hiatus hernia.  He does have a trip planned to [place] and would like to get this resolved before he goes. Review of Systems     Objective:   Physical Exam Alert and in no distress otherwise not examined       Assessment & Plan:  Gastroesophageal reflux disease without esophagitis  Hiatus hernia syndrome He is to start back taking Prilosec 40 mg twice per day.  I will set him up for appointment with Dr. Elnoria Howard for possible endoscopy and also to talk about Nissen fundoplication as a last resort.

## 2020-11-30 ENCOUNTER — Telehealth: Payer: Medicare PPO | Admitting: Family Medicine

## 2020-11-30 DIAGNOSIS — Z1159 Encounter for screening for other viral diseases: Secondary | ICD-10-CM | POA: Diagnosis not present

## 2020-12-04 ENCOUNTER — Telehealth: Payer: Self-pay

## 2020-12-04 ENCOUNTER — Encounter: Payer: Self-pay | Admitting: Family Medicine

## 2020-12-04 NOTE — Telephone Encounter (Signed)
I do not have any else to offer.

## 2020-12-04 NOTE — Telephone Encounter (Signed)
Pt advised that Dr. Loreta Ave office would not see him. Referral was changed to Dr. Christella Hartigan and they don't have an appointment until 02/26/21. Pt was advised to call to be added to their cancellation list . Please advise if there is any thing that the pt can do in the mean time.   Henry Rogers

## 2020-12-05 NOTE — Telephone Encounter (Signed)
Advised on my chart. KH

## 2020-12-11 ENCOUNTER — Other Ambulatory Visit: Payer: Self-pay | Admitting: Family Medicine

## 2020-12-11 DIAGNOSIS — I2510 Atherosclerotic heart disease of native coronary artery without angina pectoris: Secondary | ICD-10-CM

## 2020-12-21 DIAGNOSIS — Z1159 Encounter for screening for other viral diseases: Secondary | ICD-10-CM | POA: Diagnosis not present

## 2021-01-23 ENCOUNTER — Telehealth: Payer: Self-pay | Admitting: Family Medicine

## 2021-01-23 NOTE — Telephone Encounter (Signed)
Dr. Susann Givens call pt and wife. Had more info. KH

## 2021-01-23 NOTE — Telephone Encounter (Signed)
Pt called he developed symptoms and took home test last night and was positive for covid He is having cong, severe HA and cong He declined virtual visit but wanted to let you know

## 2021-02-18 ENCOUNTER — Telehealth: Payer: Medicare PPO | Admitting: Family Medicine

## 2021-02-18 ENCOUNTER — Encounter: Payer: Self-pay | Admitting: Family Medicine

## 2021-02-18 ENCOUNTER — Other Ambulatory Visit: Payer: Self-pay

## 2021-02-18 VITALS — Ht 66.0 in | Wt 185.0 lb

## 2021-02-18 DIAGNOSIS — Z7184 Encounter for health counseling related to travel: Secondary | ICD-10-CM

## 2021-02-18 MED ORDER — TYPHOID VACCINE PO CPDR: 1.0000 | DELAYED_RELEASE_CAPSULE | ORAL | 0 refills | Status: DC

## 2021-02-18 NOTE — Progress Notes (Signed)
   Subjective:    Patient ID: Henry Rogers, male    DOB: 02/10/1951, 70 y.o.   MRN: [id number]  HPI Documentation for virtual audio and video telecommunications through Caregility encounter: The patient was located at home. 2 patient identifiers used.  The provider was located in the office. The patient did consent to this visit and is aware of possible charges through their insurance for this visit. The other persons participating in this telemedicine service were none. Time spent on call was 5 minutes and in review of previous records >20 minutes total for counseling and coordination of care. This virtual service is not related to other E/M service within previous 7 days.  He [place] and is concerned about medications that they might need.  His record was reviewed and he is up-to-date on his tetanus shot.  He is not in an endemic area for malaria.  They will be involved in a controlled environment and not going into the rural areas.  Review of the record indicates he would probably need hepatitis A and typhoid is recommended as well.  He will be going to [place] in about a year.  Recommend that we discuss possibly doing typhoid again and malaria prophylaxis at that time.  Review of Systems     Objective:   Physical Exam  Alert and in no distress otherwise not examined      Assessment & Plan:  Counseling about travel - Plan: typhoid (VIVOTIF) DR capsule Explained the need to finish the medication at least 1 week prior to his trip. He will also be set up for hepatitis A injections. Recommend we readdress this before he goes to [place].

## 2021-02-19 ENCOUNTER — Telehealth: Payer: Self-pay

## 2021-02-19 NOTE — Telephone Encounter (Signed)
Recv'd fax that VIVOTIF TYPHOID ORAL VACCINE not covered, request alternative.  [facility] ID# [id number] and no alternatives available.  Checked Good Rx and no discount cards. Called [facility] they have no discount cards to help with cost.  Suggested Singlecare.  Checked Singlecare and cheapest would be [facility] $99.  Left message for pt

## 2021-02-25 ENCOUNTER — Other Ambulatory Visit: Payer: Self-pay

## 2021-02-26 ENCOUNTER — Encounter: Payer: Self-pay | Admitting: Gastroenterology

## 2021-02-26 ENCOUNTER — Ambulatory Visit: Payer: Medicare PPO | Admitting: Gastroenterology

## 2021-02-26 DIAGNOSIS — K219 Gastro-esophageal reflux disease without esophagitis: Secondary | ICD-10-CM | POA: Diagnosis not present

## 2021-02-26 MED ORDER — OMEPRAZOLE 40 MG PO CPDR
40.0000 mg | DELAYED_RELEASE_CAPSULE | Freq: Every day | ORAL | 3 refills | Status: DC
Start: 2021-02-26 — End: 2021-07-24

## 2021-02-26 MED ORDER — FAMOTIDINE 20 MG PO TABS: 20.0000 mg | ORAL_TABLET | Freq: Every day | ORAL | Status: DC

## 2021-02-26 NOTE — Addendum Note (Signed)
Addended by: Rachael Fee on: 02/26/2021 03:04 PM   Modules accepted: Level of Service

## 2021-02-26 NOTE — Progress Notes (Signed)
Review of pertinent gastrointestinal problems: 1. Colonoscopy Dr. Elnoria Howard 03/2015 for screening: findings 37mm ascending colon polyp, sigmoid diverticulosis. Polyp was "unremarkable mucosa" on path, not precancerous polyp.  He needs recall colonoscopy 03/2025.   2. GERD.  EGD December 2019 for chronic GERD showed slightly irregular but nonnodular Z-line.   HPI: This is a pleasant 70 year old man who is here with his wife today.  His wife and another family member recommended that he try to come off of his proton pump inhibitor Prilosec.  He was taking 40 mg in the morning and 20 mg at bedtime and on that regimen his GERD symptoms were perfectly well controlled.  He weaned off and while weaning off he was absolutely miserable.  He was waking at night with waterbrash.  He is having a constant burning debilitating pain in his epigastrium.  Bending over made things worse.  He lost 8 to 10 pounds around this time because he could not eat.  He was miserable after he ate due to heartburn.  He decided to restart his Prilosec at its previous dose 40 mg in the morning and 20 mg at bedtime and all of his symptoms completely resolved.  He feels fine now.  He had no dysphagia throughout this.  His weight is stabilized.  He and his wife believe that he had a hiatal hernia proven by some type of an x-ray test many many years ago which.  I do not have access to that test result.  He does have loose stools that seem to improve when he was off of Prilosec and worsen again when he started it back.  ROS: complete GI ROS as described in HPI, all other review negative.  Constitutional:  No unintentional weight loss   Past Medical History:  Diagnosis Date   BPH (benign prostatic hyperplasia)    CAD (coronary artery disease)    a. inf STEMI s/p BMS to mRCA (02/03/14)  b. inf STEMI s/p PCTA to RCA with slow reflow.   Chronic diastolic CHF (congestive heart failure) (HCC)    a. 2D echo on 05/18/15 LVEF 55-60%, akinesis  basal inferior wall, G2DD     GERD (gastroesophageal reflux disease)    Hearing loss    Hemorrhoids    HH (hiatus hernia)    Ischemic cardiomyopathy    a. ECHO 01/2014 EF 45-50% and akinesis of the entire inferior myocardium. Mild MR.   b. now resolved (EF 55-60% on 04/22/15)     Metabolic syndrome    OSA (obstructive sleep apnea)    a. mild not requiring CPAP at this time   PAF (paroxysmal atrial fibrillation) (HCC)    a. brief episode after cardiac cath on 05/16/15- resolved after IV amiodarone.   Tinnitus    Varicose veins     Past Surgical History:  Procedure Laterality Date   CARDIAC CATHETERIZATION N/A 05/16/2015   Procedure: Left Heart Cath and Coronary Angiography;  Surgeon: Lyn Records, MD;  Location: [facility] INVASIVE CV LAB;  Service: Cardiovascular;  Laterality: N/A;   CARDIAC CATHETERIZATION N/A 05/16/2015   Procedure: Coronary Stent Intervention;  Surgeon: Lyn Records, MD;  Location: [facility] INVASIVE CV LAB;  Service: Cardiovascular;  Laterality: N/A;   CARPAL TUNNEL RELEASE Bilateral 03/02/2015   Procedure: BILATERAL CARPAL TUNNEL RELEASE ;  Surgeon: Cindee Salt, MD;  Location: [facility];  Service: Orthopedics;  Laterality: Bilateral;   COLONOSCOPY  2006   Dr. Elnoria Howard   CORONARY ANGIOPLASTY WITH STENT PLACEMENT  02/02/14  BMS to RCA   ENDOVENOUS ABLATION SAPHENOUS VEIN W/ LASER Right 10-12-2014   EVLA right greater saphenous vein by Gretta Began MD   LEFT HEART CATHETERIZATION WITH CORONARY ANGIOGRAM N/A 02/02/2014   Procedure: LEFT HEART CATHETERIZATION WITH CORONARY ANGIOGRAM;  Surgeon: Peter M Swaziland, MD;  Location: [facility] CATH LAB;  Service: Cardiovascular;  Laterality: N/A;   stab phlebectomy  Right 03-15-2015   stab phlebectomy > 20 incisions (right leg) by Gretta Began MD    Current Outpatient Medications  Medication Sig Dispense Refill   aspirin 81 MG tablet Take 81 mg by mouth daily.     atorvastatin (LIPITOR) 80 MG tablet TAKE 1 TABLET(80 MG) BY MOUTH DAILY AT  6 PM 90 tablet 3   BRILINTA 60 MG TABS tablet TAKE 1 TABLET(60 MG) BY MOUTH TWICE DAILY 180 tablet 3   Melatonin 10 MG CAPS Take 10 mg by mouth at bedtime. Takes daily     metoprolol tartrate (LOPRESSOR) 25 MG tablet TAKE 1 TABLET(25 MG) BY MOUTH TWICE DAILY 180 tablet 0   Multiple Vitamin (MULTIVITAMIN) tablet Take 1 tablet by mouth every evening.      nitroGLYCERIN (NITROSTAT) 0.4 MG SL tablet Place 1 tablet (0.4 mg total) under the tongue every 5 (five) minutes as needed for chest pain. MAX 3 doses 25 tablet 7   Nutritional Supplements (JUICE PLUS FIBRE PO) Take 1 tablet by mouth 2 (two) times daily. Fruit blend and vegetable blend     omega-3 acid ethyl esters (LOVAZA) 1 g capsule Take 2 capsules (2 g total) by mouth 2 (two) times daily. 360 capsule 3   omeprazole (PRILOSEC) 20 MG capsule TAKE 1 CAPSULE(20 MG) BY MOUTH DAILY 90 capsule 0   omeprazole (PRILOSEC) 40 MG capsule Take 1 capsule (40 mg total) by mouth daily. 90 capsule 3   tadalafil (CIALIS) 5 MG tablet Take 1 tablet (5 mg total) by mouth daily. 90 tablet 3   typhoid (VIVOTIF) DR capsule Take 1 capsule by mouth every other day. 4 capsule 0   zolpidem (AMBIEN) 5 MG tablet TAKE 1 TABLET BY MOUTH AT BEDTIME AS NEEDED FOR SLEEP 30 tablet 0   Current Facility-Administered Medications  Medication Dose Route Frequency Provider Last Rate Last Admin   bupivacaine (MARCAINE) 0.5 % (with pres) injection 50 mL  50 mL Infiltration Once Ronnald Nian, MD        Allergies as of 02/26/2021 - Review Complete 02/26/2021  Allergen Reaction Noted   Ace inhibitors  05/18/2015    Family History  Problem Relation Age of Onset   Hypertension Mother    Cervical cancer Mother    Breast cancer Mother    Skin cancer Mother    Colon cancer Neg Hx    Esophageal cancer Neg Hx     Social History   Socioeconomic History   Marital status: Married    Spouse name: Not on file   Number of children: Not on file   Years of education: Not on file    Highest education level: Not on file  Occupational History   Not on file  Tobacco Use   Smoking status: Former   Smokeless tobacco: Never  Vaping Use   Vaping Use: Never used  Substance and Sexual Activity   Alcohol use: Yes    Alcohol/week: 3.0 standard drinks    Types: 3 Glasses of wine per week   Drug use: No   Sexual activity: Not Currently  Other Topics Concern   Not  on file  Social History Narrative   Not on file   Social Determinants of Health   Financial Resource Strain: Not on file  Food Insecurity: Not on file  Transportation Needs: Not on file  Physical Activity: Not on file  Stress: Not on file  Social Connections: Not on file  Intimate Partner Violence: Not on file     Physical Exam: BP 104/70 (BP Location: Left Arm, Patient Position: Sitting, Cuff Size: Normal)   Pulse 64   Ht 5\' 6"  (1.676 m)   Wt 190 lb 6 oz (86.4 kg)   SpO2 98%   BMI 30.73 kg/m  Constitutional: generally well-appearing Psychiatric: alert and oriented x3 Abdomen: soft, nontender, nondistended, no obvious ascites, no peritoneal signs, normal bowel sounds No peripheral edema noted in lower extremities  Assessment and plan: 70 y.o. male with GERD without alarm symptoms  He is GERD symptoms seem quite well controlled with proton pump inhibitor.  He is taking omeprazole 40 mg in the morning and 20 mg at bedtime.  He is concerned about long-term side effects and wants to come off the medicine completely.  I explained to him that I am not sure he would ever be able to come off of proton pump inhibitors without some type of a procedure to tighten his GE junction given how severe his symptoms are when he tried coming off.  I recommended he cut back to Prilosec 40 mg in the morning and Pepcid 20 mg at bedtime and that we proceed with an EGD at his soonest convenience to get another look at his GE junction, check for small hiatal hernias.  He and his wife especially are quite interested in him  having some type of therapeutic, surgical procedure to close his GE junction so that he would never have to resume taking proton pump inhibitors.  I explained to him that we have a newer physician here who does a TIF procedure and I briefly explained how it is done, sometimes alone and sometimes in concert with the general surgery hiatal hernia reduction.  Please see the "Patient Instructions" section for addition details about the plan.  59, MD [facility] 02/26/2021, 2:40 PM   Total time on date of encounter was 45 minutes (this included time spent preparing to see the patient reviewing records; obtaining and/or reviewing separately obtained history; performing a medically appropriate exam and/or evaluation; counseling and educating the patient and family if present; ordering medications, tests or procedures if applicable; and documenting clinical information in the health record).

## 2021-02-26 NOTE — Patient Instructions (Signed)
If you are age 72 or older, your body mass index should be between 23-30. Your Body mass index is 30.73 kg/m. If this is out of the aforementioned range listed, please consider follow up with your Primary Care Provider. __________________________________________________________  The [facility] providers would like to encourage you to use [facility] to communicate with providers for non-urgent requests or questions.  Due to long hold times on the telephone, sending your provider a message by [facility] may be a faster and more efficient way to get a response.  Please allow 48 business hours for a response.  Please remember that this is for non-urgent requests.    You have been scheduled for an endoscopy. Please follow written instructions given to you at your visit today. If you use inhalers (even only as needed), please bring them with you on the day of your procedure.  Due to recent changes in healthcare laws, you may see the results of your imaging and laboratory studies on MyChart before your provider has had a chance to review them.  We understand that in some cases there may be results that are confusing or concerning to you. Not all laboratory results come back in the same time frame and the provider may be waiting for multiple results in order to interpret others.  Please give [place] 48 hours in order for your provider to thoroughly review all the results before contacting the office for clarification of your results.   We have sent the following medications to your pharmacy for you to pick up at your convenience:  CONTINUE: Omeprazole 40mg  daily prior to breakfast.  Please purchase the following medications over the counter and take as directed:  START: Pepcid 20mg  one tablet at bedtime.  Thank you for entrusting me with your care and choosing [facility].  Dr 

## 2021-02-27 ENCOUNTER — Other Ambulatory Visit: Payer: Self-pay

## 2021-02-27 ENCOUNTER — Encounter: Payer: Self-pay | Admitting: Gastroenterology

## 2021-02-27 ENCOUNTER — Ambulatory Visit (AMBULATORY_SURGERY_CENTER): Payer: Medicare PPO | Admitting: Gastroenterology

## 2021-02-27 VITALS — BP 103/67 | HR 69 | Temp 98.7°F | Resp 18 | Ht 66.0 in | Wt 190.0 lb

## 2021-02-27 DIAGNOSIS — K449 Diaphragmatic hernia without obstruction or gangrene: Secondary | ICD-10-CM | POA: Diagnosis not present

## 2021-02-27 DIAGNOSIS — R12 Heartburn: Secondary | ICD-10-CM | POA: Diagnosis not present

## 2021-02-27 DIAGNOSIS — K219 Gastro-esophageal reflux disease without esophagitis: Secondary | ICD-10-CM | POA: Diagnosis not present

## 2021-02-27 MED ORDER — SODIUM CHLORIDE 0.9 % IV SOLN: 500.0000 mL | Freq: Once | INTRAVENOUS | Status: DC

## 2021-02-27 NOTE — Patient Instructions (Signed)
YOU HAD AN ENDOSCOPIC PROCEDURE TODAY AT THE [facility]:   Refer to the procedure report that was given to you for any specific questions about what was found during the examination.  If the procedure report does not answer your questions, please call your gastroenterologist to clarify.  If you requested that your care partner not be given the details of your procedure findings, then the procedure report has been included in a sealed envelope for you to review at your convenience later.  YOU SHOULD EXPECT: Some feelings of bloating in the abdomen. Passage of more gas than usual.  Walking can help get rid of the air that was put into your GI tract during the procedure and reduce the bloating. If you had a lower endoscopy (such as a colonoscopy or flexible sigmoidoscopy) you may notice spotting of blood in your stool or on the toilet paper. If you underwent a bowel prep for your procedure, you may not have a normal bowel movement for a few days.  Please Note:  You might notice some irritation and congestion in your nose or some drainage.  This is from the oxygen used during your procedure.  There is no need for concern and it should clear up in a day or so.  SYMPTOMS TO REPORT IMMEDIATELY:  Following upper endoscopy (EGD)  Vomiting of blood or coffee ground material  New chest pain or pain under the shoulder blades  Painful or persistently difficult swallowing  New shortness of breath  Fever of 100F or higher  Black, tarry-looking stools  For urgent or emergent issues, a gastroenterologist can be reached at any hour by calling [phone]. Do not use MyChart messaging for urgent concerns.    DIET:  We do recommend a small meal at first, but then you may proceed to your regular diet.  Drink plenty of fluids but you should avoid alcoholic beverages for 24 hours.  ACTIVITY:  You should plan to take it easy for the rest of today and you should NOT DRIVE or use heavy machinery until  tomorrow (because of the sedation medicines used during the test).    FOLLOW UP: Our staff will call the number listed on your records 48-72 hours following your procedure to check on you and address any questions or concerns that you may have regarding the information given to you following your procedure. If we do not reach you, we will leave a message.  We will attempt to reach you two times.  During this call, we will ask if you have developed any symptoms of COVID 19. If you develop any symptoms (ie: fever, flu-like symptoms, shortness of breath, cough etc.) before then, please call [phone].  If you test positive for Covid 19 in the 2 weeks post procedure, please call and report this information to us.    SIGNATURES/CONFIDENTIALITY: You and/or your care partner have signed paperwork which will be entered into your electronic medical record.  These signatures attest to the fact that that the information above on your After Visit Summary has been reviewed and is understood.  Full responsibility of the confidentiality of this discharge information lies with you and/or your care-partner.  

## 2021-02-27 NOTE — Op Note (Signed)
[facility] Patient Name: Henry Rogers Procedure Date: 02/27/2021 1:51 PM MRN: [id number] Endoscopist: Rachael Fee , MD Age: 70 Referring MD:  Date of Birth: 05-08-1951 Gender: Male Account #: [id number] Procedure:                Upper GI endoscopy Indications:              Heartburn Medicines:                Monitored Anesthesia Care Procedure:                Pre-Anesthesia Assessment:                           - Prior to the procedure, a History and Physical                            was performed, and patient medications and                            allergies were reviewed. The patient's tolerance of                            previous anesthesia was also reviewed. The risks                            and benefits of the procedure and the sedation                            options and risks were discussed with the patient.                            All questions were answered, and informed consent                            was obtained. Prior Anticoagulants: The patient has                            taken brilinta last dose was day of procedure. ASA                            Grade Assessment: II - A patient with mild systemic                            disease. After reviewing the risks and benefits,                            the patient was deemed in satisfactory condition to                            undergo the procedure.                           After obtaining informed consent, the endoscope was  passed under direct vision. Throughout the                            procedure, the patient's blood pressure, pulse, and                            oxygen saturations were monitored continuously. The                            Endoscope was introduced through the mouth, and                            advanced to the second part of duodenum. The upper                            GI endoscopy was accomplished without difficulty.                             The patient tolerated the procedure well. Scope In: Scope Out: Findings:                 A 2-3 cm hiatal hernia was present, Hill                            Classification Grade III. No esophagitis.                           The exam was otherwise without abnormality. Complications:            No immediate complications. Estimated blood loss:                            None. Estimated Blood Loss:     Estimated blood loss: none. Impression:               - 2 cm hiatal hernia, Hill Classification Grade                            III. No esophagitis.                           - The examination was otherwise normal. Recommendation:           - Patient has a contact number available for                            emergencies. The signs and symptoms of potential                            delayed complications were discussed with the                            patient. Return to normal activities tomorrow.                            Written discharge instructions were provided to the  patient.                           - Resume previous diet.                           - Continue present medications. Prilosec                            (omeprazole) 40mg  pill every AM before breakfast,                            pepcid (famotidine) 20mg  pill at bedtime every                            night.                           - Dr. ' office will arrange barium esophagram                            to try to formally document reflux into the                            esophagus fundoplication, hiatal hernia repair                            procedures. , MD 02/27/2021 2:12:08 PM This report has been signed electronically.

## 2021-02-27 NOTE — Progress Notes (Signed)
No changes since office visit yesterday.

## 2021-02-27 NOTE — Progress Notes (Signed)
Vitals-CW  History reviewed. 

## 2021-02-27 NOTE — Progress Notes (Signed)
Pt in recovery with monitors in place, VSS. Report given to receiving RN. Bite guard was placed with pt awake to ensure comfort. No dental or soft tissue damage noted. RN will remove the guard when the pt is awake.  

## 2021-02-28 ENCOUNTER — Other Ambulatory Visit: Payer: Self-pay

## 2021-02-28 DIAGNOSIS — K449 Diaphragmatic hernia without obstruction or gangrene: Secondary | ICD-10-CM

## 2021-02-28 DIAGNOSIS — K219 Gastro-esophageal reflux disease without esophagitis: Secondary | ICD-10-CM

## 2021-02-28 NOTE — Progress Notes (Signed)
Order entered for BE and sent to the schedulers to set up.

## 2021-03-01 ENCOUNTER — Telehealth: Payer: Self-pay | Admitting: *Deleted

## 2021-03-01 NOTE — Telephone Encounter (Signed)
  Follow up Call-  Call back number 02/27/2021 06/25/2018  Post procedure Call Back phone  # - [phone]  Permission to leave phone message Yes Yes  Some recent data might be hidden     Patient questions:  Do you have a fever, pain , or abdominal swelling? No. Pain Score  0 *  Have you tolerated food without any problems? Yes.    Have you been able to return to your normal activities? Yes.    Do you have any questions about your discharge instructions: Diet   No. Medications  No. Follow up visit  No.  Do you have questions or concerns about your Care? No.  Actions: * If pain score is 4 or above: No action needed, pain <4.Have you developed a fever since your procedure? no  2.   Have you had an respiratory symptoms (SOB or cough) since your procedure? no  3.   Have you tested positive for COVID 19 since your procedure no  4.   Have you had any family members/close contacts diagnosed with the COVID 19 since your procedure?  no   If yes to any of these questions please route to Laverna Peace, RN and Karlton Lemon, RN

## 2021-03-05 ENCOUNTER — Telehealth: Payer: Self-pay | Admitting: Family Medicine

## 2021-03-05 DIAGNOSIS — Z7184 Encounter for health counseling related to travel: Secondary | ICD-10-CM

## 2021-03-05 MED ORDER — TYPHOID VACCINE PO CPDR: 1.0000 | DELAYED_RELEASE_CAPSULE | ORAL | 0 refills | Status: DC

## 2021-03-05 NOTE — Telephone Encounter (Signed)
PT called, he needs typhiod rx sent again to  [facility]  He was not told to refrigerate it until he went to health dept today for something else. He has been told medication is not good now

## 2021-03-06 ENCOUNTER — Telehealth: Payer: Self-pay | Admitting: Cardiology

## 2021-03-06 ENCOUNTER — Telehealth: Payer: Self-pay | Admitting: Family Medicine

## 2021-03-06 DIAGNOSIS — I2510 Atherosclerotic heart disease of native coronary artery without angina pectoris: Secondary | ICD-10-CM

## 2021-03-06 MED ORDER — TICAGRELOR 60 MG PO TABS: ORAL_TABLET | ORAL | 2 refills | Status: DC

## 2021-03-06 NOTE — Telephone Encounter (Signed)
Rx sent to pharmacy   

## 2021-03-06 NOTE — Telephone Encounter (Signed)
*  STAT* If patient is at the pharmacy, call can be transferred to refill team.   1. Which medications need to be refilled? (please list name of each medication and dose if known) BRILINTA 60 MG TABS tablet  2. Which pharmacy/location (including street and city if local pharmacy) is medication to be sent to?  [id number] - [place], [place] - [address] AT [facility] [address]  3. Do they need a 30 day or 90 day supply? 90 DS

## 2021-03-14 ENCOUNTER — Encounter: Payer: Self-pay | Admitting: Family Medicine

## 2021-03-14 ENCOUNTER — Other Ambulatory Visit: Payer: Self-pay

## 2021-03-14 ENCOUNTER — Ambulatory Visit: Payer: Medicare PPO | Admitting: Family Medicine

## 2021-03-14 VITALS — BP 106/64 | HR 83 | Temp 97.7°F | Ht 65.5 in | Wt 189.0 lb

## 2021-03-14 DIAGNOSIS — I480 Paroxysmal atrial fibrillation: Secondary | ICD-10-CM

## 2021-03-14 DIAGNOSIS — Z125 Encounter for screening for malignant neoplasm of prostate: Secondary | ICD-10-CM

## 2021-03-14 DIAGNOSIS — E785 Hyperlipidemia, unspecified: Secondary | ICD-10-CM

## 2021-03-14 DIAGNOSIS — H9113 Presbycusis, bilateral: Secondary | ICD-10-CM

## 2021-03-14 DIAGNOSIS — L570 Actinic keratosis: Secondary | ICD-10-CM | POA: Diagnosis not present

## 2021-03-14 DIAGNOSIS — I2510 Atherosclerotic heart disease of native coronary artery without angina pectoris: Secondary | ICD-10-CM

## 2021-03-14 DIAGNOSIS — I252 Old myocardial infarction: Secondary | ICD-10-CM | POA: Diagnosis not present

## 2021-03-14 DIAGNOSIS — Z23 Encounter for immunization: Secondary | ICD-10-CM

## 2021-03-14 DIAGNOSIS — K219 Gastro-esophageal reflux disease without esophagitis: Secondary | ICD-10-CM

## 2021-03-14 DIAGNOSIS — G4733 Obstructive sleep apnea (adult) (pediatric): Secondary | ICD-10-CM

## 2021-03-14 DIAGNOSIS — I83899 Varicose veins of unspecified lower extremities with other complications: Secondary | ICD-10-CM

## 2021-03-14 DIAGNOSIS — N529 Male erectile dysfunction, unspecified: Secondary | ICD-10-CM | POA: Diagnosis not present

## 2021-03-14 DIAGNOSIS — I5032 Chronic diastolic (congestive) heart failure: Secondary | ICD-10-CM

## 2021-03-14 DIAGNOSIS — Z0000 Encounter for general adult medical examination without abnormal findings: Secondary | ICD-10-CM

## 2021-03-14 DIAGNOSIS — K449 Diaphragmatic hernia without obstruction or gangrene: Secondary | ICD-10-CM

## 2021-03-14 DIAGNOSIS — N400 Enlarged prostate without lower urinary tract symptoms: Secondary | ICD-10-CM

## 2021-03-14 LAB — COMPREHENSIVE METABOLIC PANEL
ALT: 20 IU/L (ref 0–44)
AST: 25 IU/L (ref 0–40)
Albumin/Globulin Ratio: 2.7 — ABNORMAL HIGH (ref 1.2–2.2)
Albumin: 4.8 g/dL — ABNORMAL HIGH (ref 3.7–4.7)
Alkaline Phosphatase: 72 IU/L (ref 44–121)
BUN/Creatinine Ratio: 14 (ref 10–24)
BUN: 16 mg/dL (ref 8–27)
Bilirubin Total: 0.5 mg/dL (ref 0.0–1.2)
CO2: 20 mmol/L (ref 20–29)
Calcium: 9.8 mg/dL (ref 8.6–10.2)
Chloride: 103 mmol/L (ref 96–106)
Creatinine, Ser: 1.18 mg/dL (ref 0.76–1.27)
Globulin, Total: 1.8 g/dL (ref 1.5–4.5)
Glucose: 110 mg/dL — ABNORMAL HIGH (ref 65–99)
Potassium: 4.7 mmol/L (ref 3.5–5.2)
Sodium: 140 mmol/L (ref 134–144)
Total Protein: 6.6 g/dL (ref 6.0–8.5)
eGFR: 66 mL/min/{1.73_m2} (ref >59)

## 2021-03-14 LAB — CBC WITH DIFFERENTIAL/PLATELET
Basophils Absolute: 0 10*3/uL (ref 0.0–0.2)
Basos: 1 %
EOS (ABSOLUTE): 0.2 10*3/uL (ref 0.0–0.4)
Eos: 3 %
Hematocrit: 44.3 % (ref 37.5–51.0)
Hemoglobin: 15.2 g/dL (ref 13.0–17.7)
Immature Grans (Abs): 0 10*3/uL (ref 0.0–0.1)
Immature Granulocytes: 0 %
Lymphocytes Absolute: 1.8 10*3/uL (ref 0.7–3.1)
Lymphs: 31 %
MCH: 31.8 pg (ref 26.6–33.0)
MCHC: 34.3 g/dL (ref 31.5–35.7)
MCV: 93 fL (ref 79–97)
Monocytes Absolute: 0.5 10*3/uL (ref 0.1–0.9)
Monocytes: 9 %
Neutrophils Absolute: 3.2 10*3/uL (ref 1.4–7.0)
Neutrophils: 56 %
Platelets: 162 10*3/uL (ref 150–450)
RBC: 4.78 x10E6/uL (ref 4.14–5.80)
RDW: 12.5 % (ref 11.6–15.4)
WBC: 5.7 10*3/uL (ref 3.4–10.8)

## 2021-03-14 LAB — LIPID PANEL
Chol/HDL Ratio: 3.1 ratio (ref 0.0–5.0)
Cholesterol, Total: 138 mg/dL (ref 100–199)
HDL: 44 mg/dL (ref >39)
LDL Chol Calc (NIH): 73 mg/dL (ref 0–99)
Triglycerides: 119 mg/dL (ref 0–149)
VLDL Cholesterol Cal: 21 mg/dL (ref 5–40)

## 2021-03-14 MED ORDER — ATORVASTATIN CALCIUM 80 MG PO TABS: ORAL_TABLET | ORAL | 3 refills | Status: DC

## 2021-03-14 MED ORDER — TADALAFIL 5 MG PO TABS: 5.0000 mg | ORAL_TABLET | Freq: Every day | ORAL | 3 refills | Status: DC

## 2021-03-14 NOTE — Progress Notes (Signed)
Henry Rogers is a 70 y.o. male who presents for annual wellness visit ,CPE and follow-up on chronic medical conditions.  [place] and is interested in getting the hepatitis A immunization.  He has underlying coronary artery disease and is cared for by cardiology.  He gets regular checkups with them and presently is having no chest pain, shortness of breath, PND or DOE.  He is on Brilinta.  Also continues on atorvastatin without difficulty.  He does have BPH and is using Cialis with good results for that.  He does have difficulty with erections and sometimes does take extra Cialis to see if that helps with that.  He has had difficulty with reflux disease and does have a history of hiatus hernia and is considering a procedure to get this under better control as it is interfering with the quality of his life.  He sees dermatology regularly for the actinic keratoses.  He has had difficulty with varicose veins in the past but presently is doing well with that.  He does have OSA and is doing well with that.  Continues to wear his hearing aids.   Immunizations and Health Maintenance Immunization History  Administered Date(s) Administered   Fluad Quad(high Dose 65+) 03/24/2019   Influenza Split 05/24/2013   Influenza, High Dose Seasonal PF 04/12/2014, 09/04/2015, 04/03/2018, 03/12/2020   Influenza, Seasonal, Injecte, Preservative Fre 03/24/2019   Influenza-Unspecified 04/07/2016, 04/01/2017, 06/01/2017, 07/10/2018   PFIZER Comirnaty(Gray Top)Covid-19 Tri-Sucrose Vaccine 11/20/2020   PFIZER(Purple Top)SARS-COV-2 Vaccination 08/27/2019, 09/21/2019, 04/07/2020   Pneumococcal Conjugate-13 01/03/2015   Pneumococcal Polysaccharide-23 01/16/2009   Tdap 01/16/2009, 07/10/2018   Zoster Recombinat (Shingrix) 04/01/2017, 06/01/2017   Zoster, Live 01/16/2009   Health Maintenance Due  Topic Date Due   INFLUENZA VACCINE  02/18/2021    Last colonoscopy: 03/29/2015 Last  PSA: 03/13/20 Dentist: Q six months  Ophtho: Q years Exercise: work out five days a week for about 50 min.  Other doctors caring for patient include: Dr. Sw[place] cardiology, Dr. Christella Hartigan GI, Dr. Leeann Must.  Advanced Directives: Does Patient Have a Medical Advance Directive?: Yes Type of Advance Directive: Living will, Healthcare Power of Attorney Does patient want to make changes to medical advance directive?: No - Patient declined Copy of Healthcare Power of Attorney in Chart?: Yes - validated most recent copy scanned in chart (See row information)  Depression screen:  See questionnaire below.     Depression screen [facility] 2/9 03/14/2021 03/13/2020 03/01/2019 02/25/2018 02/19/2017  Decreased Interest 0 0 0 0 0  Down, Depressed, Hopeless 0 0 0 0 0  PHQ - 2 Score 0 0 0 0 0  Some recent data might be hidden    Fall Screen: See Questionaire below.   Fall Risk  03/14/2021 03/13/2020 03/01/2019 02/25/2018 02/19/2017  Falls in the past year? 0 0 0 No No  Number falls in past yr: 0 - - - -  Injury with Fall? 0 - - - -  Risk for fall due to : - No Fall Risks - - -  Follow up Falls evaluation completed - - - -    ADL screen:  See questionnaire below.  Functional Status Survey: Is the patient deaf or have difficulty hearing?: No Does the patient have difficulty seeing, even when wearing glasses/contacts?: No Does the patient have difficulty concentrating, remembering, or making decisions?: No Does the patient have difficulty walking or climbing stairs?: No Does the patient have difficulty dressing or bathing?:  No Does the patient have difficulty doing errands alone such as visiting a doctor's office or shopping?: No   Review of Systems  Constitutional: -, -unexpected weight change, -anorexia, -fatigue Allergy: -sneezing, -itching, -congestion Dermatology: denies changing moles, rash, lumps ENT: -runny nose, -ear pain, -sore throat,  Cardiology:  -chest pain, -palpitations, -orthopnea, Respiratory:  -cough, -shortness of breath, -dyspnea on exertion, -wheezing,  Gastroenterology: -abdominal pain, -nausea, -vomiting, -diarrhea, -constipation, -dysphagia Hematology: -bleeding or bruising problems Musculoskeletal: -arthralgias, -myalgias, -joint swelling, -back pain, - Ophthalmology: -vision changes,  Urology: -dysuria, -difficulty urinating,  -urinary frequency, -urgency, incontinence Neurology: -, -numbness, , -memory loss, -falls, -dizziness    PHYSICAL EXAM:    General Appearance: Alert, cooperative, no distress, appears stated age Head: Normocephalic, without obvious abnormality, atraumatic Eyes: PERRL, conjunctiva/corneas clear, EOM's intact,  Ears: Normal TM's and external ear canals Nose: Nares normal, mucosa normal, no drainage or sinus   tenderness Throat: Lips, mucosa, and tongue normal; teeth and gums normal Neck: Supple, no lymphadenopathy, thyroid:no enlargement/tenderness/nodules; no carotid bruit or JVD Lungs: Clear to auscultation bilaterally without wheezes, rales or ronchi; respirations unlabored Heart: Regular rate and rhythm, S1 and S2 normal, no murmur, rub or gallop Abdomen: Soft, non-tender, nondistended, normoactive bowel sounds, no masses, no hepatosplenomegaly Skin: Skin color, texture, turgor normal, no rashes or lesions Lymph nodes: Cervical, supraclavicular, and axillary nodes normal Neurologic: CNII-XII intact, normal strength, sensation and gait; reflexes 2+ and symmetric throughout   Psych: Normal mood, affect, hygiene and grooming  ASSESSMENT/PLAN: Routine general medical examination at a health care facility - Plan: CBC with Differential/Platelet, Comprehensive metabolic panel, Lipid panel  Vasculogenic erectile dysfunction, unspecified vasculogenic erectile dysfunction type  Gastroesophageal reflux disease without esophagitis  Benign prostatic hyperplasia without lower urinary tract symptoms - Plan: tadalafil (CIALIS) 5 MG tablet  Actinic  keratosis  Presbycusis of both ears  Hyperlipidemia LDL goal <70 - Plan: Lipid panel, atorvastatin (LIPITOR) 80 MG tablet  Chronic diastolic CHF (congestive heart failure) (HCC)  PAF (paroxysmal atrial fibrillation) (HCC)  Coronary artery disease involving native coronary artery of native heart without angina pectoris  OSA (obstructive sleep apnea) - Plan: CBC with Differential/Platelet, Comprehensive metabolic panel, Lipid panel, tadalafil (CIALIS) 5 MG tablet  Hiatus hernia syndrome  Varicose veins of leg with complications  Old inferior wall myocardial infarction - Plan: CBC with Differential/Platelet, Comprehensive metabolic panel, Lipid panel  Screening for prostate cancer - Plan: PSA  Need for hepatitis A immunization - Plan: Hepatitis A vaccine adult IM Discussed the hiatus hernia and reflux symptoms that he is having.  He is considering having a procedure done for that.  He will otherwise continue on his present medication regimen.  Discussed PSA screening (risks/benefits), recommended at least 30 minutes of aerobic activity at least 5 days/week; . Immunization recommendations discussed.  Colonoscopy recommendations reviewed.   Medicare Attestation I have personally reviewed: The patient's medical and social history Their use of alcohol, tobacco or illicit drugs Their current medications and supplements The patient's functional ability including ADLs,fall risks, home safety risks, cognitive, and hearing and visual impairment Diet and physical activities Evidence for depression or mood disorders  The patient's weight, height, and BMI have been recorded in the chart.  I have made referrals, counseling, and provided education to the patient based on review of the above and I have provided the patient with a written personalized care plan for preventive services.     Sharlot Gowda, MD   03/14/2021

## 2021-03-14 NOTE — Patient Instructions (Signed)
  Henry Rogers , Thank you for taking time to come for your Medicare Wellness Visit. I appreciate your ongoing commitment to your health goals. Please review the following plan we discussed and let me know if I can assist you in the future.   These are the goals we discussed:  Goals   None     This is a list of the screening recommended for you and due dates:  Health Maintenance  Topic Date Due   Flu Shot  02/18/2021   COVID-19 Vaccine (5 - Booster for Pfizer series) 03/23/2021   Colon Cancer Screening  03/28/2025   Tetanus Vaccine  07/10/2028   Hepatitis C Screening: USPSTF Recommendation to screen - Ages 18-79 yo.  Completed   Zoster (Shingles) Vaccine  Completed   HPV Vaccine  Aged Out   Pneumonia vaccines  Discontinued

## 2021-03-15 LAB — PSA: Prostate Specific Ag, Serum: 0.6 ng/mL (ref 0.0–4.0)

## 2021-03-20 ENCOUNTER — Other Ambulatory Visit: Payer: Self-pay

## 2021-03-20 ENCOUNTER — Other Ambulatory Visit: Payer: Self-pay | Admitting: Gastroenterology

## 2021-03-20 ENCOUNTER — Ambulatory Visit (HOSPITAL_COMMUNITY)
Admission: RE | Admit: 2021-03-20 | Discharge: 2021-03-20 | Disposition: A | Discharge status: Home / Self Care | Payer: Medicare PPO | Source: Ambulatory Visit | Attending: Gastroenterology | Admitting: Gastroenterology

## 2021-03-20 DIAGNOSIS — K449 Diaphragmatic hernia without obstruction or gangrene: Secondary | ICD-10-CM | POA: Diagnosis not present

## 2021-03-20 DIAGNOSIS — K219 Gastro-esophageal reflux disease without esophagitis: Secondary | ICD-10-CM

## 2021-04-09 ENCOUNTER — Encounter: Payer: Self-pay | Admitting: Gastroenterology

## 2021-04-09 ENCOUNTER — Other Ambulatory Visit: Payer: Self-pay

## 2021-04-09 ENCOUNTER — Ambulatory Visit (INDEPENDENT_AMBULATORY_CARE_PROVIDER_SITE_OTHER): Payer: Medicare PPO | Admitting: Gastroenterology

## 2021-04-09 VITALS — BP 130/84 | HR 64 | Ht 65.5 in | Wt 192.0 lb

## 2021-04-09 DIAGNOSIS — I480 Paroxysmal atrial fibrillation: Secondary | ICD-10-CM

## 2021-04-09 DIAGNOSIS — I2510 Atherosclerotic heart disease of native coronary artery without angina pectoris: Secondary | ICD-10-CM

## 2021-04-09 DIAGNOSIS — K449 Diaphragmatic hernia without obstruction or gangrene: Secondary | ICD-10-CM | POA: Diagnosis not present

## 2021-04-09 DIAGNOSIS — I5032 Chronic diastolic (congestive) heart failure: Secondary | ICD-10-CM

## 2021-04-09 DIAGNOSIS — G4733 Obstructive sleep apnea (adult) (pediatric): Secondary | ICD-10-CM

## 2021-04-09 DIAGNOSIS — K219 Gastro-esophageal reflux disease without esophagitis: Secondary | ICD-10-CM | POA: Diagnosis not present

## 2021-04-09 NOTE — Progress Notes (Signed)
Chief Complaint:    GERD, hiatal hernia  Referring Physician: Dr. Christella Hartigan  HPI:    Henry Rogers is a 70 year old male with a history of CAD  s/p STEMI 2015 with RCA stent (on Brilinta, ASA), CHF (EF 55-60% in 2016), ischemic cardiomyopathy, OSA, paroxysmal A. fib (no anticoagulation), BPH, hiatal hernia, GERD, referred to me by Dr. Christella Hartigan for evaluation of possible antireflux intervention with Transoral Incisionless Fundoplication (TIF) with a goal to stop or significantly reduce acid suppression therapy.  Has a longstanding history of GERD.  Symptoms relatively well controlled with Prilosec 40 mg every morning and 20 mg qhs.  Had tried titrating off PPI with immediate breakthrough symptoms, including nocturnal regurgitation, heartburn, waterbrash, dyspepsia, regurgitation with forward flexion.  He then restarted his Prilosec with resolution of symptoms.  He is interested in surgical options as a means to better control his reflux and as an alternative to continued long-term PPI.  Patient concerned about potential long-term ADR profile of continued PPIs.  In 02/2021, changed to Prilosec 40 mg every morning and Pepcid 20 mg qhs.  Repeat EGD with 2-3 cm HH and Hill grade 3 valve.  Subsequent esophagram with significant refluxate to thoracic inlet.  GERD history: -Index symptoms: Waterbrash, heartburn, regurgitation, dyspepsia, nocturnal regurgitation, dry cough. No dysphagia -Exacerbating features: Regurgitation with forward flexion,  -Medications trialed: Zantac, Prilosec, Pepcid -Current medications: Prilosec 40 mg QAM, Pepcid 20 mg qhs. Sleeps with HOB elevated -Complications:   GERD evaluation: -Last EGD: 02/2021 -Barium esophagram: 02/2021: Normal motility, small HH.  Severe gastroesophageal reflux to the level of thoracic inlet -Esophageal Manometry: None -pH/Impedance: None -Bravo: None  Endoscopic History: - EGD (06/2018): Irregular but nonnodular Z-line - EGD (02/2021): 2-3 cm  HH, Hill grade 3 valve   GERD-HRQL Questionnaire Score: 25/50 (when off PPI); marked "dissatisfied" with current health condition   Follows with Dr. [place] in the Cardiology Clinic.  Last seen on 09/17/2020.   Review of systems:     No chest pain, no SOB, no fevers, no urinary sx   Past Medical History:  Diagnosis Date   BPH (benign prostatic hyperplasia)    CAD (coronary artery disease)    a. inf STEMI s/p BMS to mRCA (02/03/14)  b. inf STEMI s/p PCTA to RCA with slow reflow.   Chronic diastolic CHF (congestive heart failure) (HCC)    a. 2D echo on 05/18/15 LVEF 55-60%, akinesis basal inferior wall, G2DD     GERD (gastroesophageal reflux disease)    Hearing loss    Hemorrhoids    HH (hiatus hernia)    Ischemic cardiomyopathy    a. ECHO 01/2014 EF 45-50% and akinesis of the entire inferior myocardium. Mild MR.   b. now resolved (EF 55-60% on 04/22/15)     Metabolic syndrome    OSA (obstructive sleep apnea)    a. mild not requiring CPAP at this time   PAF (paroxysmal atrial fibrillation) (HCC)    a. brief episode after cardiac cath on 05/16/15- resolved after IV amiodarone.   Tinnitus    Varicose veins     Patient's surgical history, family medical history, social history, medications and allergies were all reviewed in Epic    Current Outpatient Medications  Medication Sig Dispense Refill   aspirin 81 MG tablet Take 81 mg by mouth daily.     atorvastatin (LIPITOR) 80 MG tablet TAKE 1 TABLET(80 MG) BY MOUTH DAILY AT 6 PM 90 tablet 3   famotidine (PEPCID) 20 MG tablet Take 1  tablet (20 mg total) by mouth at bedtime.     Melatonin 10 MG CAPS Take 10 mg by mouth at bedtime. Takes daily     metoprolol tartrate (LOPRESSOR) 25 MG tablet TAKE 1 TABLET(25 MG) BY MOUTH TWICE DAILY 180 tablet 0   Multiple Vitamin (MULTIVITAMIN) tablet Take 1 tablet by mouth every evening.      nitroGLYCERIN (NITROSTAT) 0.4 MG SL tablet Place 1 tablet (0.4 mg total) under the tongue every 5 (five) minutes  as needed for chest pain. MAX 3 doses 25 tablet 7   Nutritional Supplements (JUICE PLUS FIBRE PO) Take 1 tablet by mouth 2 (two) times daily. Fruit blend and vegetable blend     omega-3 acid ethyl esters (LOVAZA) 1 g capsule Take 2 capsules (2 g total) by mouth 2 (two) times daily. 360 capsule 3   omeprazole (PRILOSEC) 40 MG capsule Take 1 capsule (40 mg total) by mouth daily. 90 capsule 3   tadalafil (CIALIS) 5 MG tablet Take 1 tablet (5 mg total) by mouth daily. 90 tablet 3   ticagrelor (BRILINTA) 60 MG TABS tablet TAKE 1 TABLET(60 MG) BY MOUTH TWICE DAILY 180 tablet 2   typhoid (VIVOTIF) DR capsule Take 1 capsule by mouth every other day. 4 capsule 0   zolpidem (AMBIEN) 5 MG tablet TAKE 1 TABLET BY MOUTH AT BEDTIME AS NEEDED FOR SLEEP 30 tablet 0   Current Facility-Administered Medications  Medication Dose Route Frequency Provider Last Rate Last Admin   bupivacaine (MARCAINE) 0.5 % (with pres) injection 50 mL  50 mL Infiltration Once Ronnald Nian, MD        Physical Exam:     BP 130/84   Pulse 64   Ht 5' 5.5" (1.664 m)   Wt 192 lb (87.1 kg)   SpO2 98%   BMI 31.46 kg/m   GENERAL:  Pleasant male in NAD PSYCH: : Cooperative, normal affect ABDOMEN:  Nondistended, soft SKIN:  turgor, no lesions seen Musculoskeletal:  Normal muscle tone, normal strength NEURO: Alert and oriented x 3, no focal neurologic deficits   IMPRESSION and PLAN:    1) GERD 2) Hiatal hernia  Henry Rogers is a 70 y.o. male with a long-standing history of GERD and hiatal hernia, responsive to PPI therapy but requesting antireflux surgery with goal of improved/resolved symptoms, and stopping acid suppression medications. Discussed the pathophysiology of GERD at length, to include the risks of untreated reflux (ie, strictures, Barrett's Esophagus, EAC, etc) as well as the possible treatment with medications vs antireflux surgery. In particular, we discussed the risks, benefits, and alternatives of Transoral  Incisionless Fundoplication (TIF), to include Nissen fundoplication, LINX, Stretta, partial fundoplication.  Based on the size of the hiatal hernia and the degree of LES laxity, patient would require concomitant laparoscopic hiatal hernia repair.  After discussion, patient wishes to proceed with concomitant hiatal hernia repair and TIF (cTIF).  - Referral to Dr. Andrey Campanile at [facility] for consideration of combined hiatal hernia repair with TIF - Continue Prilosec and Pepcid - Continue antireflux lifestyle/dietary modifications - Confirmed allergies with the patient and no allergies to planned perioperative antibiotics - Reviewed postoperative dietary and activity restrictions with patient and provided handout - Anticipate 23 hour post operative admission to [facility] service  3) CAD with hx of MI 4) DAPT 5) Paroxysmal Afib 6) Chronic diastolic heart failure 7) OSA - Cardiology clearance for antireflux surgery and clearance to hold Brilinta (and possibly ASA) in the peri-operative period - Has f/u with  Dr. [place] in October - Will plan to admit to [facility] for overnight observation post op given medical hx  I spent 45 minutes of time, including in depth chart review, independent review of results as outlined above, communicating results with the patient directly, face-to-face time with the patient, coordinating care, ordering studies and medications as appropriate, and documentation.       Henry Rogers ,DO, FACG 04/09/2021, 8:45 AM

## 2021-04-09 NOTE — Patient Instructions (Signed)
If you are age 72 or older, your body mass index should be between 23-30. Your Body mass index is 31.46 kg/m. If this is out of the aforementioned range listed, please consider follow up with your Primary Care Provider.  If you are age 6 or younger, your body mass index should be between 19-25. Your Body mass index is 31.46 kg/m. If this is out of the aformentioned range listed, please consider follow up with your Primary Care Provider.   __________________________________________________________  The [facility] providers would like to encourage you to use [facility] to communicate with providers for non-urgent requests or questions.  Due to long hold times on the telephone, sending your provider a message by [facility] may be a faster and more efficient way to get a response.  Please allow 48 business hours for a response.  Please remember that this is for non-urgent requests.  ____________________________________________________________  Bonita Quin are being referred to [facility] for an appointment with Gaynelle Adu, MD.    Make certain to bring a list of current medications, including any over the counter medications or vitamins. Also bring your co-pay if you have one as well as your insurance cards. [facility] is located at [address], [address]. They will contact you with an appointment. Should you need to reschedule your appointment, please contact them at [phone]. __________________________________________________________  You will be contaced by our office prior to your procedure for directions on holding your Brillinta.  If you do not hear from our office 1 week prior to your scheduled procedure, please call [phone] to discuss. __________________________________________________________  Thank you for choosing me and [facility].  Vito Cirigliano, D.O.

## 2021-04-17 NOTE — Progress Notes (Signed)
Cardiology Office Note:    Date:  04/19/2021   ID:  Henry Rogers, DOB September 13, 1950, MRN [id number]  PCP:  Ronnald Nian, MD  Cardiologist:  Carri Spillers [place], MD  Electrophysiologist:  None   Referring MD: Ronnald Nian, MD   Chief Complaint  Patient presents with   Coronary Artery Disease     History of Present Illness:    Henry Rogers is a 70 y.o. male with a hx of HLD, OSA, chronic diastolic heart failure and CAD.  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  He also had a cardiogenic shock.  RCA was stented with a 5 x 16 mm Veriflex stent postdilated to 6 mm.  EF was 45 to 50%.  The other coronary vessels did not have significant disease.  Repeat echocardiogram in September 2015 showed LV function was normalized to 55 to 60%.  He was readmitted in October 2016 with recurrent inferior infarction again associated with cardiogenic shock.  The stent in the mid RCA was patent however distal RCA was occluded was heavy thrombus.  This was treated with balloon angioplasty.  He had a transient episode of paroxysmal atrial fibrillation after the cardiac catheterization, this was resolved after IV amiodarone and has not recurred since.  He is not on any systemic anticoagulation due to the brief episodes without recurrence. He is on chronic DAPT.  On follow up today he is doing very well. No chest pain, dyspnea or palpitations. He is active. No new complaints.  Is planning to travel to [place] and [place] on a river cruise. Has seen general surgery and GI. Planning to have combinged TIF procedure and inguinal hernia repair in January.    Past Medical History:  Diagnosis Date   BPH (benign prostatic hyperplasia)    CAD (coronary artery disease)    a. inf STEMI s/p BMS to mRCA (02/03/14)  b. inf STEMI s/p PCTA to RCA with slow reflow.   Chronic diastolic CHF (congestive heart failure) (HCC)    a. 2D echo on 05/18/15  LVEF 55-60%, akinesis basal inferior wall, G2DD     GERD (gastroesophageal reflux disease)    Hearing loss    Hemorrhoids    HH (hiatus hernia)    Ischemic cardiomyopathy    a. ECHO 01/2014 EF 45-50% and akinesis of the entire inferior myocardium. Mild MR.   b. now resolved (EF 55-60% on 04/22/15)     Metabolic syndrome    OSA (obstructive sleep apnea)    a. mild not requiring CPAP at this time   PAF (paroxysmal atrial fibrillation) (HCC)    a. brief episode after cardiac cath on 05/16/15- resolved after IV amiodarone.   Tinnitus    Varicose veins     Past Surgical History:  Procedure Laterality Date   CARDIAC CATHETERIZATION N/A 05/16/2015   Procedure: Left Heart Cath and Coronary Angiography;  Surgeon: Lyn Records, MD;  Location: [facility] INVASIVE CV LAB;  Service: Cardiovascular;  Laterality: N/A;   CARDIAC CATHETERIZATION N/A 05/16/2015   Procedure: Coronary Stent Intervention;  Surgeon: Lyn Records, MD;  Location: [facility] INVASIVE CV LAB;  Service: Cardiovascular;  Laterality: N/A;   CARPAL TUNNEL RELEASE Bilateral 03/02/2015   Procedure: BILATERAL CARPAL TUNNEL RELEASE ;  Surgeon: Cindee Salt, MD;  Location: [facility];  Service: Orthopedics;  Laterality: Bilateral;   COLONOSCOPY  2006   Dr. Elnoria Howard   CORONARY ANGIOPLASTY WITH STENT PLACEMENT  02/02/14   BMS to RCA   ENDOVENOUS ABLATION SAPHENOUS VEIN W/ LASER Right 10-12-2014   EVLA right greater saphenous vein by Gretta Began MD   LEFT HEART CATHETERIZATION WITH CORONARY ANGIOGRAM N/A 02/02/2014   Procedure: LEFT HEART CATHETERIZATION WITH CORONARY ANGIOGRAM;  Surgeon: Yaroslav Gombos M [place], MD;  Location: [facility] CATH LAB;  Service: Cardiovascular;  Laterality: N/A;   stab phlebectomy  Right 03-15-2015   stab phlebectomy > 20 incisions (right leg) by Gretta Began MD    Current Medications: Current Meds  Medication Sig   aspirin 81 MG tablet Take 81 mg by mouth daily.   atorvastatin (LIPITOR) 80 MG tablet TAKE 1 TABLET(80 MG) BY MOUTH  DAILY AT 6 PM   famotidine (PEPCID) 20 MG tablet Take 1 tablet (20 mg total) by mouth at bedtime.   Melatonin 10 MG CAPS Take 10 mg by mouth at bedtime. Takes daily   metoprolol tartrate (LOPRESSOR) 25 MG tablet TAKE 1 TABLET(25 MG) BY MOUTH TWICE DAILY   Multiple Vitamin (MULTIVITAMIN) tablet Take 1 tablet by mouth every evening.    nitroGLYCERIN (NITROSTAT) 0.4 MG SL tablet Place 1 tablet (0.4 mg total) under the tongue every 5 (five) minutes as needed for chest pain. MAX 3 doses   Nutritional Supplements (JUICE PLUS FIBRE PO) Take 1 tablet by mouth 2 (two) times daily. Fruit blend and vegetable blend   omega-3 acid ethyl esters (LOVAZA) 1 g capsule Take 2 capsules (2 g total) by mouth 2 (two) times daily. (Patient taking differently: Take 2 g by mouth daily. Takes 1 capsule once a day)   omeprazole (PRILOSEC) 40 MG capsule Take 1 capsule (40 mg total) by mouth daily.   tadalafil (CIALIS) 5 MG tablet Take 1 tablet (5 mg total) by mouth daily.   ticagrelor (BRILINTA) 60 MG TABS tablet TAKE 1 TABLET(60 MG) BY MOUTH TWICE DAILY   zolpidem (AMBIEN) 5 MG tablet TAKE 1 TABLET BY MOUTH AT BEDTIME AS NEEDED FOR SLEEP   Current Facility-Administered Medications for the 04/19/21 encounter (Office Visit) with [place], Arleta Ostrum M, MD  Medication   bupivacaine (MARCAINE) 0.5 % (with pres) injection 50 mL     Allergies:   Ace inhibitors   Social History   Socioeconomic History   Marital status: Married    Spouse name: Not on file   Number of children: Not on file   Years of education: Not on file   Highest education level: Not on file  Occupational History   Not on file  Tobacco Use   Smoking status: Former   Smokeless tobacco: Never  Vaping Use   Vaping Use: Never used  Substance and Sexual Activity   Alcohol use: Yes    Alcohol/week: 3.0 standard drinks    Types: 3 Glasses of wine per week   Drug use: No   Sexual activity: Not Currently  Other Topics Concern   Not on file  Social History  Narrative   Not on file   Social Determinants of Health   Financial Resource Strain: Not on file  Food Insecurity: Not on file  Transportation Needs: Not on file  Physical Activity: Not on file  Stress: Not on file  Social Connections: Not on file     Family History: The patient's family history includes Breast cancer in his mother; Cervical cancer in his mother; Hypertension in his mother; Skin cancer in his mother. There is no history of Colon cancer or Esophageal cancer.  ROS:   Please see the history  of present illness.     All other systems reviewed and are negative.  EKGs/Labs/Other Studies Reviewed:    The following studies were reviewed today:  Cath 05/16/2015 Mid RCA to Dist RCA lesion, 10% stenosed. The lesion was previously treated with a stent (unknown type). Prox RCA to Mid RCA lesion, 25% stenosed. Prox LAD to Dist LAD lesion, 40% stenosed. Dist RCA lesion, 100% stenosed. Post intervention, there is a 10% residual stenosis.   Acute inferior myocardial infarction presenting with cardiogenic shock. PTCA of the distal RCA from 100% to less than 10%. The vessel is large and ectatic containing heavy thrombus. Reperfusion was complicated by severe "no reflow". Widely patent left coronary system. Intra-aortic balloon pump to improve diastolic flow and help resolve "no reflow". Left ventriculography was not performed     RECOMMENDATIONS:   One-to-one intra-aortic balloon, pulsation for at least 24 hours. Intravenous Levophed to keep a mean arterial pressure greater than 65 mmHg. Intravenous Aggrastat for at least 24 hours Aspirin and Brilinta indefinitely. IV amiodarone to treat acute atrial fibrillation. Prognosis is guarded. This was discussed with the patient's family in full detail. IV heparin without a bolus to start in 8 hours. Beta blocker therapy as tolerated by blood pressure. 2-D Doppler echocardiogram 24-48 hours hence. Unfortunately, the patient will  probably have a rocky post infarct course with potential for brady- and ventricular arrhythmias.  EKG:  EKG is ordered today.  NSR old inferior infarct. I have personally reviewed and interpreted this study.    Recent Labs: 03/14/2021: ALT 20; BUN 16; Creatinine, Ser 1.18; Hemoglobin 15.2; Platelets 162; Potassium 4.7; Sodium 140  Recent Lipid Panel    Component Value Date/Time   CHOL 138 03/14/2021 0927   TRIG 119 03/14/2021 0927   HDL 44 03/14/2021 0927   CHOLHDL 3.1 03/14/2021 0927   CHOLHDL 2.9 02/19/2017 0843   VLDL 15 02/19/2017 0843   LDLCALC 73 03/14/2021 0927    Physical Exam:    VS:  BP 130/82 (BP Location: Left Arm)   Pulse 69   Ht 5\' 6"  (1.676 m)   Wt 192 lb 3.2 oz (87.2 kg)   SpO2 99%   BMI 31.02 kg/m     Wt Readings from Last 3 Encounters:  04/19/21 192 lb 3.2 oz (87.2 kg)  04/09/21 192 lb (87.1 kg)  03/14/21 189 lb (85.7 kg)     GEN:  Well nourished, well developed in no acute distress HEENT: Normal NECK: No JVD; No carotid bruits LYMPHATICS: No lymphadenopathy CARDIAC: RRR, no murmurs, rubs, gallops RESPIRATORY:  Clear to auscultation without rales, wheezing or rhonchi  ABDOMEN: Soft, non-tender, non-distended MUSCULOSKELETAL:  No edema; No deformity  SKIN: Warm and dry NEUROLOGIC:  Alert and oriented x 3 PSYCHIATRIC:  Normal affect   ASSESSMENT:    1. Coronary artery disease involving native coronary artery of native heart without angina pectoris   2. Hyperlipidemia LDL goal <70     PLAN:    In order of problems listed above:  CAD: s/p inferior MI x 2. Aneurysmal RCA. Denies any recent anginal symptoms.  Continue aspirin, Brilinta, Lipitor and metoprolol. He is cleared for planned TIF procedure and inguinal hernia repair. May hold Brilinta for one week for procedure but should continue on ASA.   Hyperlipidemia: at goal.  Continue statin.  Chronic diastolic heart failure: No sign of volume overload.  Follow up in 6 months   Medication  Adjustments/Labs and Tests Ordered: Current medicines are reviewed at length with the  patient today.  Concerns regarding medicines are outlined above.  No orders of the defined types were placed in this encounter.  No orders of the defined types were placed in this encounter.   There are no Patient Instructions on file for this visit.   Signed, Edna Rede [place], MD  04/19/2021 10:28 AM    [facility]

## 2021-04-19 ENCOUNTER — Other Ambulatory Visit: Payer: Self-pay

## 2021-04-19 ENCOUNTER — Ambulatory Visit: Payer: Medicare PPO | Admitting: Cardiology

## 2021-04-19 ENCOUNTER — Encounter: Payer: Self-pay | Admitting: Cardiology

## 2021-04-19 VITALS — BP 130/82 | HR 69 | Ht 66.0 in | Wt 192.2 lb

## 2021-04-19 DIAGNOSIS — I2510 Atherosclerotic heart disease of native coronary artery without angina pectoris: Secondary | ICD-10-CM | POA: Diagnosis not present

## 2021-04-19 DIAGNOSIS — E785 Hyperlipidemia, unspecified: Secondary | ICD-10-CM | POA: Diagnosis not present

## 2021-05-23 ENCOUNTER — Other Ambulatory Visit: Payer: Self-pay | Admitting: Family Medicine

## 2021-05-23 DIAGNOSIS — I2510 Atherosclerotic heart disease of native coronary artery without angina pectoris: Secondary | ICD-10-CM

## 2021-05-29 DIAGNOSIS — I2510 Atherosclerotic heart disease of native coronary artery without angina pectoris: Secondary | ICD-10-CM | POA: Diagnosis not present

## 2021-05-29 DIAGNOSIS — K219 Gastro-esophageal reflux disease without esophagitis: Secondary | ICD-10-CM | POA: Diagnosis not present

## 2021-05-29 DIAGNOSIS — I480 Paroxysmal atrial fibrillation: Secondary | ICD-10-CM | POA: Diagnosis not present

## 2021-05-29 DIAGNOSIS — K449 Diaphragmatic hernia without obstruction or gangrene: Secondary | ICD-10-CM | POA: Diagnosis not present

## 2021-06-03 ENCOUNTER — Telehealth: Payer: Self-pay

## 2021-06-03 NOTE — Telephone Encounter (Signed)
Received call from [place] at [facility] to coordinate cTIF for mutual patient. Pt has requested that procedure be done at [facility] hospital. Morrie Sheldon states that the only dates at [facility] that Dr. Andrey Campanile has is 1/3 or 1/5. Advised that I will check with Dr. Barron Alvine and get back with her.

## 2021-06-06 NOTE — Telephone Encounter (Signed)
Dr. Barron Alvine is OK with 07/23/21 at 7:30 am. He is aware that he will follow at 8:30 am.   Left detailed vm for [facility] at [facility] letting her know that we can proceed with 07/23/21 at [facility] for cTIF.

## 2021-06-10 ENCOUNTER — Other Ambulatory Visit: Payer: Self-pay

## 2021-06-10 DIAGNOSIS — K449 Diaphragmatic hernia without obstruction or gangrene: Secondary | ICD-10-CM

## 2021-06-10 DIAGNOSIS — K219 Gastro-esophageal reflux disease without esophagitis: Secondary | ICD-10-CM

## 2021-06-10 NOTE — Telephone Encounter (Signed)
Morrie Sheldon from [facility] gave me a call back, she has confirmed the date of patient's surgery. We will proceed with 07/23/21 at [facility] hospital. I tried to call hospital scheduling to add Dr. Frankey Shown panel, but they were having some issues pulling up his information. Noreene Larsson put in a ticket to get this fixed. I will call at a later time to get panel added. Patient has been scheduled for a 2-week post-TIF follow up appt on Tuesday, 08/06/21 at 9:00 am. Pt is aware that this appt is at the [facility] office. Advised that I will send his appt information and post-TIF diet to my chart and I will place a hard copy in the mail. Pt verbalized understanding of all information and had no concerns at the end of the call.

## 2021-06-11 NOTE — Telephone Encounter (Signed)
Called and spoke with hospital scheduling again to add case. They are having some difficulties with adding EGD/TIF panel. Penni Bombard states that they are having to put in a ticket to get this authorized at [facility]. Penni Bombard told me to check back in about 30 minutes.

## 2021-06-11 NOTE — Telephone Encounter (Signed)
Orders have been corrected.  

## 2021-06-19 DIAGNOSIS — L821 Other seborrheic keratosis: Secondary | ICD-10-CM | POA: Diagnosis not present

## 2021-06-19 DIAGNOSIS — L820 Inflamed seborrheic keratosis: Secondary | ICD-10-CM | POA: Diagnosis not present

## 2021-06-19 DIAGNOSIS — D225 Melanocytic nevi of trunk: Secondary | ICD-10-CM | POA: Diagnosis not present

## 2021-06-19 DIAGNOSIS — D179 Benign lipomatous neoplasm, unspecified: Secondary | ICD-10-CM | POA: Diagnosis not present

## 2021-06-19 DIAGNOSIS — Z86018 Personal history of other benign neoplasm: Secondary | ICD-10-CM | POA: Diagnosis not present

## 2021-06-19 DIAGNOSIS — D2272 Melanocytic nevi of left lower limb, including hip: Secondary | ICD-10-CM | POA: Diagnosis not present

## 2021-06-19 DIAGNOSIS — D235 Other benign neoplasm of skin of trunk: Secondary | ICD-10-CM | POA: Diagnosis not present

## 2021-06-19 DIAGNOSIS — Z808 Family history of malignant neoplasm of other organs or systems: Secondary | ICD-10-CM | POA: Diagnosis not present

## 2021-06-19 DIAGNOSIS — Z23 Encounter for immunization: Secondary | ICD-10-CM | POA: Diagnosis not present

## 2021-06-19 DIAGNOSIS — L578 Other skin changes due to chronic exposure to nonionizing radiation: Secondary | ICD-10-CM | POA: Diagnosis not present

## 2021-06-19 HISTORY — PX: SKIN BIOPSY: SHX1

## 2021-06-20 ENCOUNTER — Other Ambulatory Visit: Payer: Self-pay

## 2021-06-20 ENCOUNTER — Ambulatory Visit
Admission: RE | Admit: 2021-06-20 | Discharge: 2021-06-20 | Disposition: A | Discharge status: Home / Self Care | Payer: Medicare PPO | Source: Ambulatory Visit | Attending: Family Medicine | Admitting: Family Medicine

## 2021-06-20 ENCOUNTER — Encounter: Payer: Self-pay | Admitting: Family Medicine

## 2021-06-20 ENCOUNTER — Telehealth: Payer: Medicare PPO | Admitting: Family Medicine

## 2021-06-20 VITALS — Temp 98.6°F | Wt 192.0 lb

## 2021-06-20 DIAGNOSIS — M25559 Pain in unspecified hip: Secondary | ICD-10-CM | POA: Diagnosis not present

## 2021-06-20 NOTE — Progress Notes (Signed)
   Subjective:    Patient ID: Henry Rogers, male    DOB: August 04, 1950, 70 y.o.   MRN: [id number]  HPI Documentation for virtual audio and video telecommunications through Caregility encounter: The patient was located at home. 2 patient identifiers used.  The provider was located in the office. The patient did consent to this visit and is aware of possible charges through their insurance for this visit. The other persons participating in this telemedicine service were 15 minutes total for counseling and coordination of care.  This virtual service is not related to other E/M service within previous 7 days.  He states that he has a 31-month history of bilateral hip pain.  Pain has been getting worse recently.  He apparently did get acupuncture on this but saw no relief of that.  He did get some relief with using a pelvic type of belt.  He points to the area of his greater trochanters as to where the pain comes.  He does state that he will occasionally go down his legs.  Review of Systems     Objective:   Physical Exam Alert and in no distress he was observed to move his hips without pain.  Palpated over his greater trochanters and was felt no pain there.       Assessment & Plan:  Hip pain - Plan: DG Hip Unilat W OR W/O Pelvis 1V Right, DG Hip Unilat W OR W/O Pelvis 1V Left I explained that I would need to have him come in to do more thorough evaluation as at this point there is no good reason as to why he should have bilateral hip pain.

## 2021-06-21 ENCOUNTER — Ambulatory Visit: Payer: Medicare PPO | Admitting: Family Medicine

## 2021-06-24 ENCOUNTER — Encounter: Payer: Self-pay | Admitting: Family Medicine

## 2021-06-24 ENCOUNTER — Other Ambulatory Visit: Payer: Self-pay

## 2021-06-24 ENCOUNTER — Ambulatory Visit: Payer: Medicare PPO | Admitting: Family Medicine

## 2021-06-24 ENCOUNTER — Ambulatory Visit
Admission: RE | Admit: 2021-06-24 | Discharge: 2021-06-24 | Disposition: A | Discharge status: Home / Self Care | Payer: Medicare PPO | Source: Ambulatory Visit | Attending: Family Medicine | Admitting: Family Medicine

## 2021-06-24 VITALS — BP 110/78 | HR 70 | Temp 97.1°F | Wt 193.2 lb

## 2021-06-24 DIAGNOSIS — M25552 Pain in left hip: Secondary | ICD-10-CM | POA: Diagnosis not present

## 2021-06-24 DIAGNOSIS — M25551 Pain in right hip: Secondary | ICD-10-CM | POA: Diagnosis not present

## 2021-06-24 DIAGNOSIS — M2578 Osteophyte, vertebrae: Secondary | ICD-10-CM | POA: Diagnosis not present

## 2021-06-24 DIAGNOSIS — I700 Atherosclerosis of aorta: Secondary | ICD-10-CM | POA: Diagnosis not present

## 2021-06-24 DIAGNOSIS — M545 Low back pain, unspecified: Secondary | ICD-10-CM | POA: Diagnosis not present

## 2021-06-24 NOTE — Progress Notes (Addendum)
   Subjective:    Patient ID: Henry Rogers, male    DOB: 09/09/1950, 70 y.o.   MRN: [id number]  HPI He is here for evaluation of a several month history of bilateral hip pain.  He is able to do his ADLs and having no difficulty but does wake up with this pain.  He has found that using a belt in the hip area, does help with this pain.  No numbness, tingling or weakness down his legs.  He has not tripped and fallen.   Review of Systems     Objective:   Physical Exam Full motion of the hip is noted.  No tenderness over the greater trochanter.  No tenderness over the SI joints or sciatic notch.  DTRs are normal.  Negative straight leg raising.       Assessment & Plan:  Bilateral hip pain - Plan: DG Lumbar Spine Complete Discussed options with him.  Explained that it seems to be more musculoskeletal than anything especially since using the belt, does relieve his discomfort.  Discussed chiropractic manipulation, physical therapy.  I will wait for the x-rays and then move forward with either of these.  He was comfortable with that. 12/6 x-ray shows evidence of aortic atherosclerosis

## 2021-06-25 DIAGNOSIS — I700 Atherosclerosis of aorta: Secondary | ICD-10-CM | POA: Insufficient documentation

## 2021-06-26 DIAGNOSIS — M9903 Segmental and somatic dysfunction of lumbar region: Secondary | ICD-10-CM | POA: Diagnosis not present

## 2021-06-26 DIAGNOSIS — M9902 Segmental and somatic dysfunction of thoracic region: Secondary | ICD-10-CM | POA: Diagnosis not present

## 2021-06-26 DIAGNOSIS — M9905 Segmental and somatic dysfunction of pelvic region: Secondary | ICD-10-CM | POA: Diagnosis not present

## 2021-06-26 DIAGNOSIS — M5441 Lumbago with sciatica, right side: Secondary | ICD-10-CM | POA: Diagnosis not present

## 2021-06-28 DIAGNOSIS — M5441 Lumbago with sciatica, right side: Secondary | ICD-10-CM | POA: Diagnosis not present

## 2021-06-28 DIAGNOSIS — M9903 Segmental and somatic dysfunction of lumbar region: Secondary | ICD-10-CM | POA: Diagnosis not present

## 2021-06-28 DIAGNOSIS — M9902 Segmental and somatic dysfunction of thoracic region: Secondary | ICD-10-CM | POA: Diagnosis not present

## 2021-06-28 DIAGNOSIS — M9905 Segmental and somatic dysfunction of pelvic region: Secondary | ICD-10-CM | POA: Diagnosis not present

## 2021-07-01 DIAGNOSIS — M9903 Segmental and somatic dysfunction of lumbar region: Secondary | ICD-10-CM | POA: Diagnosis not present

## 2021-07-01 DIAGNOSIS — M5441 Lumbago with sciatica, right side: Secondary | ICD-10-CM | POA: Diagnosis not present

## 2021-07-01 DIAGNOSIS — M9905 Segmental and somatic dysfunction of pelvic region: Secondary | ICD-10-CM | POA: Diagnosis not present

## 2021-07-01 DIAGNOSIS — M9902 Segmental and somatic dysfunction of thoracic region: Secondary | ICD-10-CM | POA: Diagnosis not present

## 2021-07-03 ENCOUNTER — Telehealth: Payer: Self-pay | Admitting: Gastroenterology

## 2021-07-03 NOTE — Telephone Encounter (Signed)
Called the patient to verify what booklet he is speaking of. Patient stated it was the brochure that we give when the procedure is first discussed. Verified address and will place it in the mail today

## 2021-07-03 NOTE — Telephone Encounter (Signed)
Inbound call from patient requesting if information booklet on TIF procedure can be mailed to him. States he misplaced his copy.

## 2021-07-04 DIAGNOSIS — M9903 Segmental and somatic dysfunction of lumbar region: Secondary | ICD-10-CM | POA: Diagnosis not present

## 2021-07-04 DIAGNOSIS — M9902 Segmental and somatic dysfunction of thoracic region: Secondary | ICD-10-CM | POA: Diagnosis not present

## 2021-07-04 DIAGNOSIS — M5441 Lumbago with sciatica, right side: Secondary | ICD-10-CM | POA: Diagnosis not present

## 2021-07-04 DIAGNOSIS — M9905 Segmental and somatic dysfunction of pelvic region: Secondary | ICD-10-CM | POA: Diagnosis not present

## 2021-07-08 DIAGNOSIS — M9903 Segmental and somatic dysfunction of lumbar region: Secondary | ICD-10-CM | POA: Diagnosis not present

## 2021-07-08 DIAGNOSIS — M9902 Segmental and somatic dysfunction of thoracic region: Secondary | ICD-10-CM | POA: Diagnosis not present

## 2021-07-08 DIAGNOSIS — M9905 Segmental and somatic dysfunction of pelvic region: Secondary | ICD-10-CM | POA: Diagnosis not present

## 2021-07-08 DIAGNOSIS — M5441 Lumbago with sciatica, right side: Secondary | ICD-10-CM | POA: Diagnosis not present

## 2021-07-11 DIAGNOSIS — M9903 Segmental and somatic dysfunction of lumbar region: Secondary | ICD-10-CM | POA: Diagnosis not present

## 2021-07-11 DIAGNOSIS — M9902 Segmental and somatic dysfunction of thoracic region: Secondary | ICD-10-CM | POA: Diagnosis not present

## 2021-07-11 DIAGNOSIS — M9905 Segmental and somatic dysfunction of pelvic region: Secondary | ICD-10-CM | POA: Diagnosis not present

## 2021-07-11 DIAGNOSIS — M5441 Lumbago with sciatica, right side: Secondary | ICD-10-CM | POA: Diagnosis not present

## 2021-07-16 ENCOUNTER — Ambulatory Visit: Payer: Self-pay | Admitting: General Surgery

## 2021-07-16 DIAGNOSIS — M9905 Segmental and somatic dysfunction of pelvic region: Secondary | ICD-10-CM | POA: Diagnosis not present

## 2021-07-16 DIAGNOSIS — M9902 Segmental and somatic dysfunction of thoracic region: Secondary | ICD-10-CM | POA: Diagnosis not present

## 2021-07-16 DIAGNOSIS — M9903 Segmental and somatic dysfunction of lumbar region: Secondary | ICD-10-CM | POA: Diagnosis not present

## 2021-07-16 DIAGNOSIS — M5441 Lumbago with sciatica, right side: Secondary | ICD-10-CM | POA: Diagnosis not present

## 2021-07-16 NOTE — Pre-Procedure Instructions (Signed)
Surgical Instructions    Your procedure is scheduled on Tuesday 07/23/21.   Report to [facility] Main Entrance "A" at 05:30 A.M., then check in with the Admitting office.  Call this number if you have problems the morning of surgery:  [phone]   If you have any questions prior to your surgery date call [phone]: Open Monday-Friday 8am-4pm    Remember:  Do not eat or drink after midnight the night before your surgery    Take these medicines the morning of surgery with A SIP OF WATER   metoprolol tartrate (LOPRESSOR)  omeprazole (PRILOSEC)    Acetaminophen- If needed  nitroGLYCERIN (NITROSTAT)- If needed  Please follow your surgeon's instructions regarding ticagrelor (BRILINTA). If you have not received instructions then please contact your surgeon's office for instructions.   As of today, STOP taking any Aspirin (unless otherwise instructed by your surgeon) Aleve, Naproxen, Ibuprofen, Motrin, Advil, Goody's, BC's, all herbal medications, fish oil, and all vitamins.     After your COVID test   You are not required to quarantine however you are required to wear a well-fitting mask when you are out and around people not in your household.  If your mask becomes wet or soiled, replace with a new one.  Wash your hands often with soap and water for 20 seconds or clean your hands with an alcohol-based hand sanitizer that contains at least 60% alcohol.  Do not share personal items.  Notify your provider: if you are in close contact with someone who has COVID  or if you develop a fever of 100.4 or greater, sneezing, cough, sore throat, shortness of breath or body aches.             Do not wear jewelry or makeup Do not wear lotions, powders, perfumes/colognes, or deodorant. Do not shave 48 hours prior to surgery.  Men may shave face and neck. Do not bring valuables to the hospital. DO Not wear nail polish, gel polish, artificial nails, or any other type of covering on  natural nails including finger and toenails. If patients have artificial nails, gel coating, etc. that need to be removed by a nail salon, please have this removed prior to surgery or surgery may need to be canceled/delayed if the surgeon/ anesthesia feels like the patient is unable to be adequately monitored.             [place] is not responsible for any belongings or valuables.  Do NOT Smoke (Tobacco/Vaping)  24 hours prior to your procedure  If you use a CPAP at night, you may bring your mask for your overnight stay.   Contacts, glasses, hearing aids, dentures or partials may not be worn into surgery, please bring cases for these belongings   For patients admitted to the hospital, discharge time will be determined by your treatment team.   Patients discharged the day of surgery will not be allowed to drive home, and someone needs to stay with them for 24 hours.  NO VISITORS WILL BE ALLOWED IN PRE-OP WHERE PATIENTS ARE PREPPED FOR SURGERY.  ONLY 1 SUPPORT PERSON MAY BE PRESENT IN THE WAITING ROOM WHILE YOU ARE IN SURGERY.  IF YOU ARE TO BE ADMITTED, ONCE YOU ARE IN YOUR ROOM YOU WILL BE ALLOWED TWO (2) VISITORS. 1 (ONE) VISITOR MAY STAY OVERNIGHT BUT MUST ARRIVE TO THE ROOM BY 8pm.  Minor children may have two parents present. Special consideration for safety and communication needs will be reviewed on a case  by case basis.  Special instructions:    Oral Hygiene is also important to reduce your risk of infection.  Remember - BRUSH YOUR TEETH THE MORNING OF SURGERY WITH YOUR REGULAR TOOTHPASTE   [place]- Preparing For Surgery  Before surgery, you can play an important role. Because skin is not sterile, your skin needs to be as free of germs as possible. You can reduce the number of germs on your skin by washing with CHG (chlorahexidine gluconate) Soap before surgery.  CHG is an antiseptic cleaner which kills germs and bonds with the skin to continue killing germs even after washing.      Please do not use if you have an allergy to CHG or antibacterial soaps. If your skin becomes reddened/irritated stop using the CHG.  Do not shave (including legs and underarms) for at least 48 hours prior to first CHG shower. It is OK to shave your face.  Please follow these instructions carefully.     Shower the NIGHT BEFORE SURGERY and the MORNING OF SURGERY with CHG Soap.   If you chose to wash your hair, wash your hair first as usual with your normal shampoo. After you shampoo, rinse your hair and body thoroughly to remove the shampoo.  Then [employer] and genitals (private parts) with your normal soap and rinse thoroughly to remove soap.  After that Use CHG Soap as you would any other liquid soap. You can apply CHG directly to the skin and wash gently with a scrungie or a clean washcloth.   Apply the CHG Soap to your body ONLY FROM THE NECK DOWN.  Do not use on open wounds or open sores. Avoid contact with your eyes, ears, mouth and genitals (private parts). Wash Face and genitals (private parts)  with your normal soap.   Wash thoroughly, paying special attention to the area where your surgery will be performed.  Thoroughly rinse your body with warm water from the neck down.  DO NOT shower/wash with your normal soap after using and rinsing off the CHG Soap.  Pat yourself dry with a CLEAN TOWEL.  Wear CLEAN PAJAMAS to bed the night before surgery  Place CLEAN SHEETS on your bed the night before your surgery  DO NOT SLEEP WITH PETS.   Day of Surgery:  Take a shower with CHG soap. Wear Clean/Comfortable clothing the morning of surgery Do not apply any deodorants/lotions.   Remember to brush your teeth WITH YOUR REGULAR TOOTHPASTE.   Please read over the following fact sheets that you were given.

## 2021-07-17 ENCOUNTER — Encounter (HOSPITAL_COMMUNITY): Payer: Self-pay

## 2021-07-17 ENCOUNTER — Encounter (HOSPITAL_COMMUNITY)
Admission: RE | Admit: 2021-07-17 | Discharge: 2021-07-17 | Disposition: A | Discharge status: Home / Self Care | Payer: Medicare PPO | Source: Ambulatory Visit | Attending: General Surgery | Admitting: General Surgery

## 2021-07-17 ENCOUNTER — Other Ambulatory Visit: Payer: Self-pay

## 2021-07-17 VITALS — BP 147/90 | HR 74 | Temp 97.5°F | Resp 17 | Ht 66.0 in | Wt 194.8 lb

## 2021-07-17 DIAGNOSIS — Z7982 Long term (current) use of aspirin: Secondary | ICD-10-CM | POA: Insufficient documentation

## 2021-07-17 DIAGNOSIS — E785 Hyperlipidemia, unspecified: Secondary | ICD-10-CM | POA: Diagnosis not present

## 2021-07-17 DIAGNOSIS — Z79899 Other long term (current) drug therapy: Secondary | ICD-10-CM | POA: Diagnosis not present

## 2021-07-17 DIAGNOSIS — I2510 Atherosclerotic heart disease of native coronary artery without angina pectoris: Secondary | ICD-10-CM | POA: Diagnosis not present

## 2021-07-17 DIAGNOSIS — I5032 Chronic diastolic (congestive) heart failure: Secondary | ICD-10-CM | POA: Insufficient documentation

## 2021-07-17 DIAGNOSIS — K4090 Unilateral inguinal hernia, without obstruction or gangrene, not specified as recurrent: Secondary | ICD-10-CM | POA: Insufficient documentation

## 2021-07-17 DIAGNOSIS — Z01812 Encounter for preprocedural laboratory examination: Secondary | ICD-10-CM | POA: Insufficient documentation

## 2021-07-17 DIAGNOSIS — G4733 Obstructive sleep apnea (adult) (pediatric): Secondary | ICD-10-CM | POA: Insufficient documentation

## 2021-07-17 DIAGNOSIS — I252 Old myocardial infarction: Secondary | ICD-10-CM | POA: Diagnosis not present

## 2021-07-17 DIAGNOSIS — Z01818 Encounter for other preprocedural examination: Secondary | ICD-10-CM

## 2021-07-17 LAB — CBC
HCT: 44.2 % (ref 39.0–52.0)
Hemoglobin: 15 g/dL (ref 13.0–17.0)
MCH: 31.7 pg (ref 26.0–34.0)
MCHC: 33.9 g/dL (ref 30.0–36.0)
MCV: 93.4 fL (ref 80.0–100.0)
Platelets: 208 10*3/uL (ref 150–400)
RBC: 4.73 MIL/uL (ref 4.22–5.81)
RDW: 12.7 % (ref 11.5–15.5)
WBC: 5 10*3/uL (ref 4.0–10.5)
nRBC: 0 % (ref 0.0–0.2)

## 2021-07-17 LAB — BASIC METABOLIC PANEL
Anion gap: 6 (ref 5–15)
BUN: 18 mg/dL (ref 8–23)
CO2: 24 mmol/L (ref 22–32)
Calcium: 9.3 mg/dL (ref 8.9–10.3)
Chloride: 104 mmol/L (ref 98–111)
Creatinine, Ser: 1.04 mg/dL (ref 0.61–1.24)
GFR, Estimated: >60 mL/min (ref >60)
Glucose, Bld: 105 mg/dL — ABNORMAL HIGH (ref 70–99)
Potassium: 4.1 mmol/L (ref 3.5–5.1)
Sodium: 134 mmol/L — ABNORMAL LOW (ref 135–145)

## 2021-07-17 NOTE — Progress Notes (Signed)
PCP - Dr. Sharlot Gowda Cardiologist - Dr. Peter [place]  PPM/ICD - n/a  Chest x-ray - n/a EKG - 04/19/21 Stress Test - denies ECHO - 05/17/15 Cardiac Cath - 05/16/15  Sleep Study - Many years ago per patient. Approximately 2005 CPAP - Does not use  Fasting Blood Sugar - n/a. Denies having diabetes   Blood Thinner Instructions: Patient was instructed by Dr. [place] to hold Brilinta one week prior to procedure. Patient states he took his last dose today 07/17/21.  Aspirin Instructions: Patient states he was instructed to continue Aspirin through day of surgery.   ERAS Protcol - No. NPO PRE-SURGERY Ensure or G2- n/a  COVID TEST- Scheduled for 07/19/21 at 0915. Patient is aware of date, time, and location.    Anesthesia review: Yes. Cardiac History. Clearance note 04/19/21 from Dr. [place].   Patient denies shortness of breath, fever, cough and chest pain at PAT appointment   All instructions explained to the patient, with a verbal understanding of the material. Patient agrees to go over the instructions while at home for a better understanding. Patient also instructed to self quarantine after being tested for COVID-19. The opportunity to ask questions was provided.

## 2021-07-18 ENCOUNTER — Telehealth: Payer: Medicare PPO | Admitting: Family Medicine

## 2021-07-18 ENCOUNTER — Encounter: Payer: Self-pay | Admitting: Family Medicine

## 2021-07-18 VITALS — Wt 194.0 lb

## 2021-07-18 DIAGNOSIS — K219 Gastro-esophageal reflux disease without esophagitis: Secondary | ICD-10-CM

## 2021-07-18 DIAGNOSIS — M25552 Pain in left hip: Secondary | ICD-10-CM | POA: Diagnosis not present

## 2021-07-18 DIAGNOSIS — M25551 Pain in right hip: Secondary | ICD-10-CM | POA: Diagnosis not present

## 2021-07-18 NOTE — Progress Notes (Signed)
Anesthesia Chart Review:  Follows with cardiology for history of HLD, OSA, chronic diastolic heart failure and CAD.  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  He also had a cardiogenic shock.  RCA was stented with a 5 x 16 mm Veriflex stent postdilated to 6 mm.  EF was 45 to 50%.  The other coronary vessels did not have significant disease.  Repeat echocardiogram in September 2015 showed LV function was normalized to 55 to 60%.  He was readmitted in October 2016 with recurrent inferior infarction again associated with cardiogenic shock.  The stent in the mid RCA was patent however distal RCA was occluded was heavy thrombus.  This was treated with balloon angioplasty.  He had a transient episode of paroxysmal atrial fibrillation after the cardiac catheterization, this was resolved after IV amiodarone and has not recurred since.  He is not on any systemic anticoagulation due to the brief episodes without recurrence. He is on chronic DAPT.  Last seen by Dr. Swaziland 04/19/2021.  Per note, he was doing well, active, no chest pain, dyspnea or palpitations.  He was discussed he is planning to have a combined TIF procedure and inguinal hernia repair in January 2023.  Clearance per note, "CAD: s/p inferior MI x 2. Aneurysmal RCA. Denies any recent anginal symptoms.  Continue aspirin, Brilinta, Lipitor and metoprolol. He is cleared for planned TIF procedure and inguinal hernia repair. May hold Brilinta for one week for procedure but should continue on ASA."  Patient reports last dose of Brilinta 07/17/2021.  Preop labs reviewed, unremarkable.  EKG 04/19/2021: NSR.  Rate 69.  Possible inferior infarct, old.  TTE 05/18/2015: - Left ventricle: The cavity size was normal. Systolic function was    normal. The estimated ejection fraction was in the range of 55%    to 60%. There is akinesis of the basalinferior myocardium. There    is  hypokinesis of the basal-midinferolateral myocardium. Features    are consistent with a pseudonormal left ventricular filling    pattern, with concomitant abnormal relaxation and increased    filling pressure (grade 2 diastolic dysfunction). Doppler    parameters are consistent with high ventricular filling pressure.  - Aortic valve: Moderate thickening and calcification, consistent    with sclerosis.  - Mitral valve: Calcified annulus. Mildly thickened leaflets .    There was mild regurgitation.  Cath and PCI 05/16/2015: Acute inferior myocardial infarction presenting with cardiogenic shock. PTCA of the distal RCA from 100% to less than 10%. The vessel is large and ectatic containing heavy thrombus. Reperfusion was complicated by severe "no reflow". Widely patent left coronary system. Intra-aortic balloon pump to improve diastolic flow and help resolve "no reflow". Left ventriculography was not performed     RECOMMENDATIONS:   One-to-one intra-aortic balloon, pulsation for at least 24 hours. Intravenous Levophed to keep a mean arterial pressure greater than 65 mmHg. Intravenous Aggrastat for at least 24 hours Aspirin and Brilinta indefinitely. IV amiodarone to treat acute atrial fibrillation. Prognosis is guarded. This was discussed with the patient's family in full detail. IV heparin without a bolus to start in 8 hours. Beta blocker therapy as tolerated by blood pressure. 2-D Doppler echocardiogram 24-48 hours hence. Unfortunately, the patient will probably have a rocky post infarct course with potential for brady- and ventricular arrhythmias.  Zannie Cove [facility] Short Stay Center/Anesthesiology Phone [phone] 07/18/2021 10:23 AM

## 2021-07-18 NOTE — Progress Notes (Signed)
° °  Subjective:    Patient ID: Henry Rogers, male    DOB: 10/26/1950, 70 y.o.   MRN: [id number]  HPI Documentation for virtual audio and video telecommunications through Caregility encounter: The patient was located at home. 2 patient identifiers used.  The provider was located in the office. The patient did consent to this visit and is aware of possible charges through their insurance for this visit. The other persons participating in this telemedicine service were none. Time spent on call was 5 minutes and in review of previous records >20 minutes total for counseling and coordination of care. This virtual service is not related to other E/M service within previous 7 days.  He has multiple questions concerning and pending Tip surgery and continued difficulty with back pain.  He has been seeing a [facility].  He does get relatively quick relief from his pain but it does not last long and he is concerned about continuing this versus starting physical therapy.  He also has concerns about starting physical therapy in relation to the procedure.  He has questions about adequate nutrition in regard to protein as initially he is going to be on a clear liquid diet and slowly increase his food intake over a several week timeframe.  Review of Systems     Objective:   Physical Exam Alert and in no distress otherwise not examined X-rays were reviewed and does show degenerative changes especially at L5.      Assessment & Plan:  Bilateral hip pain - Plan: Ambulatory referral to Physical Therapy  Gastroesophageal reflux disease without esophagitis I explained that I will put an order in for physical therapy but I would need the approval of GI concerning when to start this in regard to his impending surgery.  Discussed the fact that if the PT does not work then referral to orthopedics would be appropriate.  Discussed possible MRI and surgery versus epidural injections. With then discussed the fluid  intake and the restrictions on this.  I deferred comment on that to GI since they will be the ones that will be in charge of this and recommend that he asked some concerning protein supplementation during the same timeframe.

## 2021-07-18 NOTE — Anesthesia Preprocedure Evaluation (Addendum)
Anesthesia Evaluation  Patient identified by MRN, date of birth, ID band Patient awake    Reviewed: Allergy & Precautions, NPO status , Patient's Chart, lab work & pertinent test results, reviewed documented beta blocker date and time   Airway Mallampati: III  TM Distance: >3 FB Neck ROM: Full    Dental  (+) Teeth Intact, Dental Advisory Given   Pulmonary sleep apnea , former smoker,    Pulmonary exam normal breath sounds clear to auscultation       Cardiovascular hypertension, Pt. on home beta blockers and Pt. on medications (-) angina+ CAD, + Past MI, + Cardiac Stents (BMS to RCA) and +CHF  Normal cardiovascular exam+ dysrhythmias (PAF) Atrial Fibrillation  Rhythm:Regular Rate:Normal     Neuro/Psych negative neurological ROS  negative psych ROS   GI/Hepatic Neg liver ROS, hiatal hernia, GERD  Medicated,  Endo/Other  Obesity   Renal/GU negative Renal ROS     Musculoskeletal negative musculoskeletal ROS (+)   Abdominal   Peds  Hematology  (+) Blood dyscrasia (Brilinta), ,   Anesthesia Other Findings   Reproductive/Obstetrics                           Anesthesia Physical Anesthesia Plan  ASA: 3  Anesthesia Plan: General   Post-op Pain Management: Tylenol PO (pre-op)   Induction: Intravenous  PONV Risk Score and Plan: 3 and Dexamethasone and Ondansetron  Airway Management Planned: Oral ETT  Additional Equipment:   Intra-op Plan:   Post-operative Plan: Extubation in OR  Informed Consent: I have reviewed the patients History and Physical, chart, labs and discussed the procedure including the risks, benefits and alternatives for the proposed anesthesia with the patient or authorized representative who has indicated his/her understanding and acceptance.     Dental advisory given  Plan Discussed with: CRNA  Anesthesia Plan Comments: (2nd PIV after induction  PAT note by Antionette Poles, PA-C: Follows with cardiology for history of HLD,OSA, chronic diastolic heart failure and CAD. 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 He also had a cardiogenic shock. RCA was stented with a 5 x 16 mm Veriflex stentpostdilated to 6 mm. EF was 45 to 50%. The other coronary vessels did not have significant disease. Repeat echocardiogram in September 2015 showed LV function was normalized to 55 to 60%. He was readmitted in October 2016 with recurrent inferior infarction again associated with cardiogenic shock. The stent in the mid RCA was patent however distal RCA was occluded was heavy thrombus. This was treated with balloon angioplasty. He had a transient episode of paroxysmal atrial fibrillation after the cardiac catheterization, this was resolved after IV amiodarone and has not recurred since. He is not on any systemic anticoagulation due to the brief episodes without recurrence. He is on chronic DAPT.  Last seen by Dr. Swaziland 04/19/2021.  Per note, he was doing well, active, no chest pain, dyspnea or palpitations.  He was discussed he is planning to have a combined TIF procedure and inguinal hernia repair in January 2023.  Clearance per note, "CAD: s/p inferior MI x 2. Aneurysmal RCA. Denies any recent anginal symptoms. Continue aspirin, Brilinta, Lipitor and metoprolol. He is cleared for planned TIF procedure and inguinal hernia repair. May hold Brilinta for one week for procedure but should continue on ASA."  Patient reports last dose of Brilinta 07/17/2021.  Preop labs reviewed, unremarkable.  EKG 04/19/2021: NSR.  Rate 69.  Possible inferior infarct, old.  TTE 05/18/2015: - Left ventricle: The cavity size was normal. Systolic function was  normal. The estimated ejection fraction was in the range of 55%  to 60%. There is akinesis of the basalinferior myocardium. There  is hypokinesis of the  basal-midinferolateral myocardium. Features  are consistent with a pseudonormal left ventricular filling  pattern, with concomitant abnormal relaxation and increased  filling pressure (grade 2 diastolic dysfunction). Doppler  parameters are consistent with high ventricular filling pressure.  - Aortic valve: Moderate thickening and calcification, consistent  with sclerosis.  - Mitral valve: Calcified annulus. Mildly thickened leaflets .  There was mild regurgitation.  Cath and PCI 05/16/2015:  Acute inferior myocardial infarction presenting with cardiogenic shock.  PTCA of the distal RCA from 100% to less than 10%. The vessel is large and ectatic containing heavy thrombus. Reperfusion was complicated by severe "no reflow".  Widely patent left coronary system.  Intra-aortic balloon pump to improve diastolic flow and help resolve "no reflow".  Left ventriculography was not performed   RECOMMENDATIONS:   One-to-one intra-aortic balloon, pulsation for at least 24 hours.  Intravenous Levophed to keep a mean arterial pressure greater than 65 mmHg.  Intravenous Aggrastat for at least 24 hours  Aspirin and Brilinta indefinitely.  IV amiodarone to treat acute atrial fibrillation.  Prognosis is guarded. This was discussed with the patient's family in full detail.  IV heparin without a bolus to start in 8 hours.  Beta blocker therapy as tolerated by blood pressure.  2-D Doppler echocardiogram 24-48 hours hence.  Unfortunately, the patient will probably have a rocky post infarct course with potential for brady- and ventricular arrhythmias.  Karoline Caldwell, PA-C )     Anesthesia Quick Evaluation

## 2021-07-19 ENCOUNTER — Other Ambulatory Visit (HOSPITAL_COMMUNITY)
Admission: RE | Admit: 2021-07-19 | Discharge: 2021-07-19 | Disposition: A | Discharge status: Home / Self Care | Payer: Medicare PPO | Source: Ambulatory Visit | Attending: General Surgery | Admitting: General Surgery

## 2021-07-19 ENCOUNTER — Telehealth: Payer: Self-pay

## 2021-07-19 DIAGNOSIS — M5441 Lumbago with sciatica, right side: Secondary | ICD-10-CM | POA: Diagnosis not present

## 2021-07-19 DIAGNOSIS — M9903 Segmental and somatic dysfunction of lumbar region: Secondary | ICD-10-CM | POA: Diagnosis not present

## 2021-07-19 DIAGNOSIS — Z01818 Encounter for other preprocedural examination: Secondary | ICD-10-CM

## 2021-07-19 DIAGNOSIS — Z01812 Encounter for preprocedural laboratory examination: Secondary | ICD-10-CM | POA: Insufficient documentation

## 2021-07-19 DIAGNOSIS — M9902 Segmental and somatic dysfunction of thoracic region: Secondary | ICD-10-CM | POA: Diagnosis not present

## 2021-07-19 DIAGNOSIS — Z20822 Contact with and (suspected) exposure to covid-19: Secondary | ICD-10-CM | POA: Insufficient documentation

## 2021-07-19 DIAGNOSIS — M9905 Segmental and somatic dysfunction of pelvic region: Secondary | ICD-10-CM | POA: Diagnosis not present

## 2021-07-19 LAB — SARS CORONAVIRUS 2 (TAT 6-24 HRS): SARS Coronavirus 2: NEGATIVE

## 2021-07-19 NOTE — Telephone Encounter (Signed)
Received a call from Raven at [facility]. She states that she was calling to see if Dr. Barron Alvine wanted to add a CPT code for cTIF that is scheduled at [facility] on 1/3. I advised that the only CPT code we use for that procedure is 43281. Raven states that they are still working on the authorization and that she is going to call [facility] and see if she can speak with a nurse directly to expedite this process. Raven had no other concerns at the end of the call.

## 2021-07-22 DIAGNOSIS — M9905 Segmental and somatic dysfunction of pelvic region: Secondary | ICD-10-CM | POA: Diagnosis not present

## 2021-07-22 DIAGNOSIS — M5441 Lumbago with sciatica, right side: Secondary | ICD-10-CM | POA: Diagnosis not present

## 2021-07-22 DIAGNOSIS — M9903 Segmental and somatic dysfunction of lumbar region: Secondary | ICD-10-CM | POA: Diagnosis not present

## 2021-07-22 DIAGNOSIS — M9902 Segmental and somatic dysfunction of thoracic region: Secondary | ICD-10-CM | POA: Diagnosis not present

## 2021-07-23 ENCOUNTER — Ambulatory Visit (HOSPITAL_COMMUNITY): Payer: Medicare PPO | Admitting: Anesthesiology

## 2021-07-23 ENCOUNTER — Encounter (HOSPITAL_COMMUNITY)
Admission: RE | Disposition: A | Discharge status: Home / Self Care | Payer: Self-pay | Source: Ambulatory Visit | Attending: Internal Medicine

## 2021-07-23 ENCOUNTER — Other Ambulatory Visit: Payer: Self-pay

## 2021-07-23 ENCOUNTER — Observation Stay (HOSPITAL_COMMUNITY)
Admission: RE | Admit: 2021-07-23 | Discharge: 2021-07-24 | Disposition: A | Discharge status: Home / Self Care | Payer: Medicare PPO | Source: Ambulatory Visit | Attending: Internal Medicine | Admitting: Internal Medicine

## 2021-07-23 ENCOUNTER — Encounter (HOSPITAL_COMMUNITY): Payer: Self-pay | Admitting: General Surgery

## 2021-07-23 DIAGNOSIS — I480 Paroxysmal atrial fibrillation: Secondary | ICD-10-CM | POA: Diagnosis not present

## 2021-07-23 DIAGNOSIS — E785 Hyperlipidemia, unspecified: Secondary | ICD-10-CM | POA: Diagnosis not present

## 2021-07-23 DIAGNOSIS — I2583 Coronary atherosclerosis due to lipid rich plaque: Secondary | ICD-10-CM | POA: Diagnosis not present

## 2021-07-23 DIAGNOSIS — I10 Essential (primary) hypertension: Secondary | ICD-10-CM | POA: Diagnosis not present

## 2021-07-23 DIAGNOSIS — Z87891 Personal history of nicotine dependence: Secondary | ICD-10-CM | POA: Insufficient documentation

## 2021-07-23 DIAGNOSIS — Z9889 Other specified postprocedural states: Secondary | ICD-10-CM

## 2021-07-23 DIAGNOSIS — M545 Low back pain, unspecified: Secondary | ICD-10-CM | POA: Diagnosis not present

## 2021-07-23 DIAGNOSIS — I110 Hypertensive heart disease with heart failure: Secondary | ICD-10-CM | POA: Diagnosis not present

## 2021-07-23 DIAGNOSIS — K317 Polyp of stomach and duodenum: Secondary | ICD-10-CM

## 2021-07-23 DIAGNOSIS — I5032 Chronic diastolic (congestive) heart failure: Secondary | ICD-10-CM | POA: Diagnosis not present

## 2021-07-23 DIAGNOSIS — K219 Gastro-esophageal reflux disease without esophagitis: Secondary | ICD-10-CM | POA: Diagnosis not present

## 2021-07-23 DIAGNOSIS — I2510 Atherosclerotic heart disease of native coronary artery without angina pectoris: Secondary | ICD-10-CM | POA: Diagnosis not present

## 2021-07-23 DIAGNOSIS — K449 Diaphragmatic hernia without obstruction or gangrene: Principal | ICD-10-CM

## 2021-07-23 DIAGNOSIS — K2100 Gastro-esophageal reflux disease with esophagitis, without bleeding: Secondary | ICD-10-CM

## 2021-07-23 HISTORY — PX: HIATAL HERNIA REPAIR: SHX195

## 2021-07-23 HISTORY — PX: TRANSORAL INCISIONLESS FUNDOPLICATION: SHX6840

## 2021-07-23 HISTORY — PX: ESOPHAGEAL DILATION: SHX303

## 2021-07-23 HISTORY — PX: ESOPHAGOGASTRODUODENOSCOPY: SHX5428

## 2021-07-23 LAB — CBC
HCT: 42.9 % (ref 39.0–52.0)
Hemoglobin: 14.6 g/dL (ref 13.0–17.0)
MCH: 31.8 pg (ref 26.0–34.0)
MCHC: 34 g/dL (ref 30.0–36.0)
MCV: 93.5 fL (ref 80.0–100.0)
Platelets: 199 10*3/uL (ref 150–400)
RBC: 4.59 MIL/uL (ref 4.22–5.81)
RDW: 13 % (ref 11.5–15.5)
WBC: 10.1 10*3/uL (ref 4.0–10.5)
nRBC: 0 % (ref 0.0–0.2)

## 2021-07-23 LAB — BASIC METABOLIC PANEL
Anion gap: 6 (ref 5–15)
BUN: 15 mg/dL (ref 8–23)
CO2: 25 mmol/L (ref 22–32)
Calcium: 8.9 mg/dL (ref 8.9–10.3)
Chloride: 104 mmol/L (ref 98–111)
Creatinine, Ser: 1.07 mg/dL (ref 0.61–1.24)
GFR, Estimated: >60 mL/min (ref >60)
Glucose, Bld: 134 mg/dL — ABNORMAL HIGH (ref 70–99)
Potassium: 3.7 mmol/L (ref 3.5–5.1)
Sodium: 135 mmol/L (ref 135–145)

## 2021-07-23 SURGERY — REPAIR, HERNIA, HIATAL, LAPAROSCOPIC
Anesthesia: General | Site: Abdomen

## 2021-07-23 MED ORDER — SCOPOLAMINE 1 MG/3DAYS TD PT72
MEDICATED_PATCH | TRANSDERMAL | Status: AC
  Administered 2021-07-23: 1.5 mg via TRANSDERMAL
  Filled 2021-07-23: qty 1

## 2021-07-23 MED ORDER — ONDANSETRON HCL 4 MG/2ML IJ SOLN
INTRAMUSCULAR | Status: AC
  Administered 2021-07-23: 4 mg via INTRAVENOUS
  Filled 2021-07-23: qty 2

## 2021-07-23 MED ORDER — BUPIVACAINE LIPOSOME 1.3 % IJ SUSP
INTRAMUSCULAR | Status: AC
  Filled 2021-07-23: qty 20

## 2021-07-23 MED ORDER — FENTANYL CITRATE (PF) 250 MCG/5ML IJ SOLN
INTRAMUSCULAR | Status: AC
  Filled 2021-07-23: qty 5

## 2021-07-23 MED ORDER — LACTATED RINGERS IV SOLN
INTRAVENOUS | Status: DC | PRN
Start: 2021-07-23 — End: 2021-07-23

## 2021-07-23 MED ORDER — SIMETHICONE 80 MG PO CHEW
80.0000 mg | CHEWABLE_TABLET | Freq: Four times a day (QID) | ORAL | Status: DC | PRN
  Administered 2021-07-23 – 2021-07-24 (×3): 80 mg via ORAL
  Filled 2021-07-23 (×4): qty 1

## 2021-07-23 MED ORDER — BUPIVACAINE LIPOSOME 1.3 % IJ SUSP
INTRAMUSCULAR | Status: DC | PRN
  Administered 2021-07-23: 20 mL

## 2021-07-23 MED ORDER — PHENYLEPHRINE 40 MCG/ML (10ML) SYRINGE FOR IV PUSH (FOR BLOOD PRESSURE SUPPORT)
PREFILLED_SYRINGE | INTRAVENOUS | Status: DC | PRN
  Administered 2021-07-23 (×2): 80 ug via INTRAVENOUS
  Administered 2021-07-23: 120 ug via INTRAVENOUS

## 2021-07-23 MED ORDER — PROPOFOL 10 MG/ML IV BOLUS
INTRAVENOUS | Status: AC
  Filled 2021-07-23: qty 20

## 2021-07-23 MED ORDER — LIDOCAINE 5 % EX PTCH
1.0000 | MEDICATED_PATCH | CUTANEOUS | Status: DC
  Administered 2021-07-23: 1 via TRANSDERMAL
  Filled 2021-07-23: qty 1

## 2021-07-23 MED ORDER — SUGAMMADEX SODIUM 200 MG/2ML IV SOLN
INTRAVENOUS | Status: DC | PRN
  Administered 2021-07-23: 160 mg via INTRAVENOUS

## 2021-07-23 MED ORDER — MIDAZOLAM HCL 2 MG/2ML IJ SOLN
INTRAMUSCULAR | Status: AC
  Filled 2021-07-23: qty 2

## 2021-07-23 MED ORDER — CHLORHEXIDINE GLUCONATE CLOTH 2 % EX PADS: 6.0000 | MEDICATED_PAD | Freq: Once | CUTANEOUS | Status: DC

## 2021-07-23 MED ORDER — ONDANSETRON HCL 4 MG/2ML IJ SOLN: 4.0000 mg | Freq: Once | INTRAMUSCULAR | Status: DC | PRN

## 2021-07-23 MED ORDER — ACETAMINOPHEN 500 MG PO TABS
ORAL_TABLET | ORAL | Status: AC
  Administered 2021-07-23: 1000 mg via ORAL
  Filled 2021-07-23: qty 2

## 2021-07-23 MED ORDER — LACTATED RINGERS IV SOLN: INTRAVENOUS | Status: DC

## 2021-07-23 MED ORDER — LIDOCAINE VISCOUS HCL 2 % MT SOLN
5.0000 mL | Freq: Three times a day (TID) | OROMUCOSAL | Status: DC | PRN
  Filled 2021-07-23: qty 15

## 2021-07-23 MED ORDER — PROPOFOL 10 MG/ML IV BOLUS
INTRAVENOUS | Status: DC | PRN
  Administered 2021-07-23: 50 mg via INTRAVENOUS
  Administered 2021-07-23: 80 mg via INTRAVENOUS

## 2021-07-23 MED ORDER — CEFAZOLIN SODIUM-DEXTROSE 2-4 GM/100ML-% IV SOLN
INTRAVENOUS | Status: AC
  Filled 2021-07-23: qty 100

## 2021-07-23 MED ORDER — ASPIRIN 81 MG PO CHEW: 81.0000 mg | CHEWABLE_TABLET | Freq: Every day | ORAL | Status: DC

## 2021-07-23 MED ORDER — DEXAMETHASONE SODIUM PHOSPHATE 10 MG/ML IJ SOLN
8.0000 mg | Freq: Four times a day (QID) | INTRAMUSCULAR | Status: DC
  Administered 2021-07-23 – 2021-07-24 (×2): 8 mg via INTRAVENOUS
  Filled 2021-07-23 (×5): qty 0.8

## 2021-07-23 MED ORDER — TADALAFIL 5 MG PO TABS
5.0000 mg | ORAL_TABLET | Freq: Every day | ORAL | Status: DC
  Administered 2021-07-23: 5 mg via ORAL
  Filled 2021-07-23 (×2): qty 1

## 2021-07-23 MED ORDER — 0.9 % SODIUM CHLORIDE (POUR BTL) OPTIME
TOPICAL | Status: DC | PRN
  Administered 2021-07-23: 1000 mL

## 2021-07-23 MED ORDER — ACETAMINOPHEN 500 MG PO TABS: 1000.0000 mg | ORAL_TABLET | ORAL | Status: AC

## 2021-07-23 MED ORDER — HEPARIN SODIUM (PORCINE) 5000 UNIT/ML IJ SOLN: 5000.0000 [IU] | Freq: Once | INTRAMUSCULAR | Status: AC

## 2021-07-23 MED ORDER — CHLORHEXIDINE GLUCONATE 0.12 % MT SOLN
OROMUCOSAL | Status: AC
  Administered 2021-07-23: 15 mL via OROMUCOSAL
  Filled 2021-07-23: qty 15

## 2021-07-23 MED ORDER — PHENYLEPHRINE HCL-NACL 20-0.9 MG/250ML-% IV SOLN
INTRAVENOUS | Status: DC | PRN
  Administered 2021-07-23: 25 ug/min via INTRAVENOUS

## 2021-07-23 MED ORDER — EPHEDRINE SULFATE 50 MG/ML IJ SOLN: INTRAMUSCULAR | Status: DC | PRN

## 2021-07-23 MED ORDER — SODIUM CHLORIDE 0.9 % IV SOLN: INTRAVENOUS | Status: DC

## 2021-07-23 MED ORDER — MELATONIN 5 MG PO TABS: 10.0000 mg | ORAL_TABLET | Freq: Every evening | ORAL | Status: DC | PRN

## 2021-07-23 MED ORDER — PROPOFOL 500 MG/50ML IV EMUL
INTRAVENOUS | Status: DC | PRN
  Administered 2021-07-23: 20 ug/kg/min via INTRAVENOUS

## 2021-07-23 MED ORDER — ACETAMINOPHEN-CODEINE 120-12 MG/5ML PO SOLN
15.0000 mL | ORAL | Status: DC | PRN
  Administered 2021-07-23 – 2021-07-24 (×4): 15 mL via ORAL
  Filled 2021-07-23 (×4): qty 15

## 2021-07-23 MED ORDER — PANTOPRAZOLE SODIUM 40 MG PO TBEC: 40.0000 mg | DELAYED_RELEASE_TABLET | Freq: Two times a day (BID) | ORAL | Status: DC

## 2021-07-23 MED ORDER — SODIUM CHLORIDE 0.9% FLUSH
3.0000 mL | Freq: Two times a day (BID) | INTRAVENOUS | Status: DC
  Administered 2021-07-23 (×2): 3 mL via INTRAVENOUS

## 2021-07-23 MED ORDER — FENTANYL CITRATE (PF) 250 MCG/5ML IJ SOLN
INTRAMUSCULAR | Status: DC | PRN
  Administered 2021-07-23: 50 ug via INTRAVENOUS
  Administered 2021-07-23: 100 ug via INTRAVENOUS

## 2021-07-23 MED ORDER — CEFAZOLIN SODIUM-DEXTROSE 2-4 GM/100ML-% IV SOLN
2.0000 g | INTRAVENOUS | Status: AC
  Administered 2021-07-23: 2 g via INTRAVENOUS

## 2021-07-23 MED ORDER — ZOLPIDEM TARTRATE 5 MG PO TABS: 5.0000 mg | ORAL_TABLET | Freq: Every evening | ORAL | Status: DC | PRN

## 2021-07-23 MED ORDER — ONDANSETRON HCL 4 MG/2ML IJ SOLN
4.0000 mg | Freq: Four times a day (QID) | INTRAMUSCULAR | Status: DC
  Administered 2021-07-23 – 2021-07-24 (×3): 4 mg via INTRAVENOUS
  Filled 2021-07-23 (×3): qty 2

## 2021-07-23 MED ORDER — ACETAMINOPHEN 650 MG RE SUPP: 650.0000 mg | Freq: Four times a day (QID) | RECTAL | Status: DC | PRN

## 2021-07-23 MED ORDER — ROCURONIUM BROMIDE 10 MG/ML (PF) SYRINGE
PREFILLED_SYRINGE | INTRAVENOUS | Status: DC | PRN
Start: 2021-07-23 — End: 2021-07-23
  Administered 2021-07-23: 60 mg via INTRAVENOUS
  Administered 2021-07-23: 20 mg via INTRAVENOUS

## 2021-07-23 MED ORDER — METOCLOPRAMIDE HCL 5 MG/ML IJ SOLN
10.0000 mg | Freq: Four times a day (QID) | INTRAMUSCULAR | Status: DC
  Administered 2021-07-23 – 2021-07-24 (×3): 10 mg via INTRAVENOUS
  Filled 2021-07-23 (×3): qty 2

## 2021-07-23 MED ORDER — FAMOTIDINE IN NACL 20-0.9 MG/50ML-% IV SOLN
20.0000 mg | Freq: Once | INTRAVENOUS | Status: AC
  Administered 2021-07-23: 20 mg via INTRAVENOUS
  Filled 2021-07-23 (×2): qty 50

## 2021-07-23 MED ORDER — ATORVASTATIN CALCIUM 80 MG PO TABS
80.0000 mg | ORAL_TABLET | Freq: Every evening | ORAL | Status: DC
Start: 2021-07-23 — End: 2021-07-24
  Administered 2021-07-23: 80 mg via ORAL
  Filled 2021-07-23: qty 1

## 2021-07-23 MED ORDER — BUPIVACAINE-EPINEPHRINE 0.25% -1:200000 IJ SOLN
INTRAMUSCULAR | Status: DC | PRN
  Administered 2021-07-23: 30 mL

## 2021-07-23 MED ORDER — DEXAMETHASONE SODIUM PHOSPHATE 10 MG/ML IJ SOLN
INTRAMUSCULAR | Status: DC | PRN
  Administered 2021-07-23: 8 mg via INTRAVENOUS

## 2021-07-23 MED ORDER — LIDOCAINE 2% (20 MG/ML) 5 ML SYRINGE
INTRAMUSCULAR | Status: DC | PRN
  Administered 2021-07-23: 80 mg via INTRAVENOUS

## 2021-07-23 MED ORDER — BUPIVACAINE-EPINEPHRINE (PF) 0.25% -1:200000 IJ SOLN
INTRAMUSCULAR | Status: AC
  Filled 2021-07-23: qty 30

## 2021-07-23 MED ORDER — PANTOPRAZOLE SODIUM 40 MG IV SOLR
40.0000 mg | Freq: Two times a day (BID) | INTRAVENOUS | Status: DC
  Administered 2021-07-23: 40 mg via INTRAVENOUS
  Filled 2021-07-23: qty 40

## 2021-07-23 MED ORDER — HEPARIN SODIUM (PORCINE) 5000 UNIT/ML IJ SOLN
INTRAMUSCULAR | Status: AC
  Administered 2021-07-23: 5000 [IU] via SUBCUTANEOUS
  Filled 2021-07-23: qty 1

## 2021-07-23 MED ORDER — CEFAZOLIN SODIUM-DEXTROSE 2-4 GM/100ML-% IV SOLN: 2.0000 g | Freq: Once | INTRAVENOUS | Status: DC

## 2021-07-23 MED ORDER — ALBUTEROL SULFATE (2.5 MG/3ML) 0.083% IN NEBU: 2.5000 mg | INHALATION_SOLUTION | Freq: Four times a day (QID) | RESPIRATORY_TRACT | Status: DC | PRN

## 2021-07-23 MED ORDER — ONDANSETRON HCL 4 MG/2ML IJ SOLN: 4.0000 mg | Freq: Once | INTRAMUSCULAR | Status: AC

## 2021-07-23 MED ORDER — SCOPOLAMINE 1 MG/3DAYS TD PT72: 1.0000 | MEDICATED_PATCH | Freq: Once | TRANSDERMAL | Status: DC

## 2021-07-23 MED ORDER — EPHEDRINE SULFATE-NACL 50-0.9 MG/10ML-% IV SOSY
PREFILLED_SYRINGE | INTRAVENOUS | Status: DC | PRN
  Administered 2021-07-23 (×4): 2.5 mg via INTRAVENOUS

## 2021-07-23 MED ORDER — METOPROLOL TARTRATE 25 MG PO TABS
25.0000 mg | ORAL_TABLET | Freq: Two times a day (BID) | ORAL | Status: DC
  Administered 2021-07-23: 25 mg via ORAL
  Filled 2021-07-23: qty 1

## 2021-07-23 MED ORDER — FENTANYL CITRATE (PF) 100 MCG/2ML IJ SOLN
INTRAMUSCULAR | Status: AC
  Filled 2021-07-23: qty 2

## 2021-07-23 MED ORDER — FENTANYL CITRATE (PF) 100 MCG/2ML IJ SOLN
25.0000 ug | INTRAMUSCULAR | Status: DC | PRN
  Administered 2021-07-23: 50 ug via INTRAVENOUS

## 2021-07-23 MED ORDER — ACETAMINOPHEN 325 MG PO TABS
650.0000 mg | ORAL_TABLET | Freq: Four times a day (QID) | ORAL | Status: DC | PRN
  Filled 2021-07-23: qty 2

## 2021-07-23 MED ORDER — ENSURE PRE-SURGERY PO LIQD: 296.0000 mL | Freq: Once | ORAL | Status: DC

## 2021-07-23 MED ORDER — ORAL CARE MOUTH RINSE: 15.0000 mL | Freq: Once | OROMUCOSAL | Status: AC

## 2021-07-23 MED ORDER — CHLORHEXIDINE GLUCONATE 0.12 % MT SOLN: 15.0000 mL | Freq: Once | OROMUCOSAL | Status: AC

## 2021-07-23 SURGICAL SUPPLY — 57 items
APPLIER CLIP ROT 10 11.4 M/L (STAPLE)
BAG COUNTER SPONGE SURGICOUNT (BAG) IMPLANT
BAG SURGICOUNT SPONGE COUNTING (BAG)
BENZOIN TINCTURE PRP APPL 2/3 (GAUZE/BANDAGES/DRESSINGS) IMPLANT
CABLE HIGH FREQUENCY MONO STRZ (ELECTRODE) IMPLANT
CHLORAPREP W/TINT 26 (MISCELLANEOUS) ×4 IMPLANT
CLIP APPLIE ROT 10 11.4 M/L (STAPLE) IMPLANT
CLOSURE WOUND 1/2 X4 (GAUZE/BANDAGES/DRESSINGS)
DECANTER SPIKE VIAL GLASS SM (MISCELLANEOUS) ×4 IMPLANT
DERMABOND ADVANCED (GAUZE/BANDAGES/DRESSINGS) ×2
DERMABOND ADVANCED .7 DNX12 (GAUZE/BANDAGES/DRESSINGS) IMPLANT
DEVICE SUT QUICK LOAD TK 5 (STAPLE) ×2 IMPLANT
DEVICE SUT TI-KNOT TK 5X26 (MISCELLANEOUS) ×1 IMPLANT
DEVICE SUTURE ENDOST 10MM (ENDOMECHANICALS) ×4 IMPLANT
DEVICE TI KNOT TK5 (MISCELLANEOUS) ×1
DISSECTOR BLUNT TIP ENDO 5MM (MISCELLANEOUS) ×4 IMPLANT
DRAIN PENROSE 0.5X18 (DRAIN) ×4 IMPLANT
DRSG TELFA 3X8 NADH (GAUZE/BANDAGES/DRESSINGS) IMPLANT
ELECT L-HOOK LAP 45CM DISP (ELECTROSURGICAL)
ELECT REM PT RETURN 9FT ADLT (ELECTROSURGICAL) ×4
ELECTRODE L-HOOK LAP 45CM DISP (ELECTROSURGICAL) ×2 IMPLANT
ELECTRODE REM PT RTRN 9FT ADLT (ELECTROSURGICAL) ×2 IMPLANT
GLOVE SRG 8 PF TXTR STRL LF DI (GLOVE) ×2 IMPLANT
GLOVE SURG POLY ORTHO LF SZ7.5 (GLOVE) ×4 IMPLANT
GLOVE SURG UNDER POLY LF SZ7 (GLOVE) IMPLANT
GLOVE SURG UNDER POLY LF SZ8 (GLOVE) ×4
GOWN STRL REUS W/ TWL LRG LVL3 (GOWN DISPOSABLE) ×6 IMPLANT
GOWN STRL REUS W/ TWL XL LVL3 (GOWN DISPOSABLE) ×2 IMPLANT
GOWN STRL REUS W/TWL LRG LVL3 (GOWN DISPOSABLE) ×12
GOWN STRL REUS W/TWL XL LVL3 (GOWN DISPOSABLE) ×4
GRASPER SUT TROCAR 14GX15 (MISCELLANEOUS) ×4 IMPLANT
IRRIG SUCT STRYKERFLOW 2 WTIP (MISCELLANEOUS)
IRRIGATION SUCT STRKRFLW 2 WTP (MISCELLANEOUS) ×2 IMPLANT
KIT BASIN OR (CUSTOM PROCEDURE TRAY) ×4 IMPLANT
KIT ESOPHYX Z+ (Miscellaneous) ×2 IMPLANT
KIT TURNOVER KIT B (KITS) IMPLANT
NS IRRIG 1000ML POUR BTL (IV SOLUTION) ×4 IMPLANT
PAD DRESSING TELFA 3X8 NADH (GAUZE/BANDAGES/DRESSINGS) IMPLANT
PENCIL SMOKE EVACUATOR (MISCELLANEOUS) IMPLANT
QUICK LOAD TK 5 (STAPLE) ×2
SCISSORS LAP 5X45 EPIX DISP (ENDOMECHANICALS) ×4 IMPLANT
SET TUBE SMOKE EVAC HIGH FLOW (TUBING) ×4 IMPLANT
SHEARS HARMONIC ACE PLUS 45CM (MISCELLANEOUS) ×4 IMPLANT
SLEEVE ENDOPATH XCEL 5M (ENDOMECHANICALS) ×14 IMPLANT
STRIP CLOSURE SKIN 1/2X4 (GAUZE/BANDAGES/DRESSINGS) IMPLANT
SUT MNCRL AB 4-0 PS2 18 (SUTURE) ×4 IMPLANT
SUT SURGIDAC NAB ES-9 0 48 120 (SUTURE) ×12 IMPLANT
TIP INNERVISION DETACH 40FR (MISCELLANEOUS) IMPLANT
TIP INNERVISION DETACH 50FR (MISCELLANEOUS) IMPLANT
TIP INNERVISION DETACH 56FR (MISCELLANEOUS) IMPLANT
TIPS INNERVISION DETACH 40FR (MISCELLANEOUS)
TOWEL GREEN STERILE FF (TOWEL DISPOSABLE) ×4 IMPLANT
TOWEL OR NON WOVEN STRL DISP B (DISPOSABLE) IMPLANT
TRAY FOLEY MTR SLVR 16FR STAT (SET/KITS/TRAYS/PACK) ×2 IMPLANT
TRAY LAPAROSCOPIC MC (CUSTOM PROCEDURE TRAY) ×4 IMPLANT
TROCAR BLADELESS OPT 5 100 (ENDOMECHANICALS) ×4 IMPLANT
TROCAR XCEL NON-BLD 11X100MML (ENDOMECHANICALS) ×4 IMPLANT

## 2021-07-23 NOTE — Interval H&P Note (Signed)
History and Physical Interval Note:  07/23/2021 7:51 AM  Henry Rogers  has presented today for surgery, with the diagnosis of gerd hiatal hernia.  The various methods of treatment have been discussed with the patient and family. After consideration of risks, benefits and other options for treatment, the patient has consented to  Procedure(s): LAPAROSCOPIC REPAIR OF HIATAL HERNIA (N/A) ESOPHAGOGASTRODUODENOSCOPY (EGD) (N/A) TRANSORAL INCISIONLESS FUNDOPLICATION (N/A) as a surgical intervention.  The patient's history has been reviewed, patient examined, no change in status, stable for surgery.  I have reviewed the patient's chart and labs.  Questions were answered to the patient's satisfaction.     Verlin Dike Ashad Fawbush

## 2021-07-23 NOTE — Op Note (Signed)
07/23/2021  9:19 AM  PATIENT:  Lenn Cal  71 y.o. male  PRE-OPERATIVE DIAGNOSIS:  Gastroesophageal reflux disease, hiatal hernia  POST-OPERATIVE DIAGNOSIS:  Gastroesophageal reflux disease, hiatal hernia  PROCEDURE:  Procedure(s): LAPAROSCOPIC REPAIR OF HIATAL HERNIA, LAPOROSCOPIC  TRANSVERSE ABDOMINIS PLANE (TAP) BLOCK  ESOPHAGOGASTRODUODENOSCOPY (EGD) TRANSORAL INCISIONLESS FUNDOPLICATION  SURGEON:  Surgeon(s): Greer Pickerel, MD Lavena Bullion, DO  ASSISTANTS: Johnathan Hausen, MD   ANESTHESIA:   general  DRAINS: none   LOCAL MEDICATIONS USED:  MARCAINE    and OTHER exparel  SPECIMEN:  No Specimen  DISPOSITION OF SPECIMEN:  N/A  COUNTS:  YES  INDICATION FOR PROCEDURE: Patient presents for combined laparoscopic hiatal hernia repair with transoral incision less fundoplication by Dr. Bryan Lemma.  Patient has well-controlled heartburn.  He is on medications.  If he does not take his medication he will have return of symptoms.  His foregut work-up was completed by GI.  He has had an esophagram and upper endoscopy.  He had a small sliding hiatal hernia and he was referred to me to perform the hiatal hernia repair as part of his TIF procedure.  Please see records for additional information  PROCEDURE: Patient was given 5000 units of subcutaneous heparin prior to procedure.  He was also given preoperative antiemetics as well as preoperative Tylenol.  He was taken to the OR to at [facility] placed supine on the operating room table.  General endotracheal anesthesia was established.  Sequential compression devices were placed.  The patient had voided prior to going to the operating room.  His arms were tucked at his side with the appropriate padding.  His abdomen was prepped and draped in the usual standard surgical fashion with ChloraPrep.  He received IV antibiotic prior to skin incision.  A surgical timeout was performed.  Access to the abdomen was gained with the  Optiview technique.  A small incision was made in the left upper quadrant at Palmer's point.  Then using a 0 degree 5 mm laparoscope I advanced it through all layers of the abdominal wall and carefully entered the abdominal cavity.  Pneumoperitoneum was smoothly established up to a patient pressure 15 mmHg without any change in patient vital signs.  The abdominal cavity was surveilled.  There is no evidence of injury to surrounding viscera.  The patient was placed in reverse Trendelenburg.  I then went about performing a bilateral laparoscopic tap block with a combination of Marcaine and Exparel for postoperative pain relief.  A 5 mm trocar was placed in the right lateral abdominal wall, an 11 mm trocar in the right mid abdomen, a 5 mm trocar above and slightly to the left of the umbilicus and a 5 mm trocar in the left midaxillary line all under direct visualization.  A 5 mm trocar was placed in the subxiphoid position and exchanged for a Nathanson liver retractor to lift up the left lobe of the liver and expose the hiatus.  Patient had an obvious dimple anteriorly.  It appeared that the majority if not the entire stomach within the abdominal cavity.  With the aid of the assistant lifting up on the gastrohepatic ligament I incised the gastrohepatic ligament with harmonic scalpel.  Continued taking it up toward the diaphragm.  The right crus of the diaphragm was identified.  I was able to grasp the right crura with a Prestige grasper.  Using another Prestige grasper I was able to carefully and bluntly dissect and identify the confluence of the left  and right crura.  There was a small sliding hiatal hernia.  There is a gap between the left and right crura.  I continued to mobilize up along the right side and across anteriorly to the left side.  The posterior vagus nerve was identified.  The anterior vagus nerve was identified.  They were both preserved.  He was a little bit sticky anterior medially.  The esophagus  was protected the entire time.  There was now intra-abdominal esophageal length.  The hiatal hernia defect was not that large.  I reapproximated the left and right crura with 2 interrupted 0 Surgidac sutures with the Endo Stitch each secured with a titanium tie knot.  This was snug but did leave enough room for the fundoplication device.  Additional local was infiltrated in the subxiphoid space.  The 11 mm trocar was removed and the fascial defect was closed with the 0 Vicryl using a PMI suture passer under direct visualization.  Local was infiltrated in this location as well.  Pneumoperitoneum was released after the liver retractor was removed.  There is no evidence of injury to surrounding viscera.  Trochars were removed.  Skin incisions were closed with a 4 Monocryl in a subcuticular fashion followed by the application of Dermabond.  All needle, instrument, and sponge counts were correct x2.  At this time the endoscopy team came into the operating room and set up for the TIF portion of the procedure which will be separately dictated by Dr. Bryan Lemma.  I discussed the intraoperative findings with the gastroenterologist and showed him the intraoperative photographs  PLAN OF CARE: Admit for overnight observation  PATIENT DISPOSITION:   still in OR with Dr Bryan Lemma    Delay start of Pharmacological VTE agent (>24hrs) due to surgical blood loss or risk of bleeding:  no  Leighton Ruff. Redmond Pulling, MD, FACS General, Bariatric, & Minimally Invasive Surgery [facility], [place]

## 2021-07-23 NOTE — Consult Note (Addendum)
[facility] Medical Consultation  Henry Rogers [id number] DOB: [birth year] DOA: 07/23/2021 PCP: Denita Lung, MD   Requesting physician: Gerrit Heck, DO Date of consultation: 07/23/2021 Reason for consultation: Need of observation overnight  Impression/Recommendations Principal Problem:   Hiatal hernia with GERD and esophagitis Active Problems:   Hiatal hernia   Gastroesophageal reflux disease with esophagitis without hemorrhage    Hiatal hernia with GERD and esophagitis: Patient status post laparoscopic hiatal hernia repair and transtentorial incision less fundoplication for a history of reflux by Dr. Bryan Lemma of gastroenterology and Dr. Redmond Pulling of general surgery. -Admit to a MedSurg bed -Clear liquid diet -Check CBC and BMP -Pain control -Appreciate GI and general surgery.  We will follow-up for any further recommendations  Essential Hypertension: Blood pressures currently stable.  Home medication regimen includes metoprolol 25 mg twice daily. -Continue home blood pressure regimen as tolerated  CAD: Patient with prior history of inferior STEMI s/p LHC 01/2014 requiring bare-metal stent placement to the mid RCA.  Patient had subsequent inferior ST elevation myocardial infarction 04/2015 requiring PCI.  He has been on dual antiplatelet therapy, but Brilinta was held for 7 days prior to the procedure. -Continue aspirin and statin -Resume Brilinta when advised per GI  Diastolic congestive heart failure: Chronic.  Patient currently appears to be euvolemic.  Last EF noted to be  55 -60% with grade 2 diastolic dysfunction back in 04/2015. -Continue to monitor  Paroxysmal atrial fibrillation: Patient was noted to have a brief episode of atrial fibrillation post cardiac cath in 2016 resolved with amiodarone.  Sinus rhythm noted monitor in PACU -Continue to monitor.  Consider need to place on telemetry and obtain EKG if needed  Lumbar back pain: X-ray imaging from 12/6  noted diffuse multilevel degenerative disc changes with endplate osteophyte formation most prominent at L5-S1.  Patient has been referred to chiropractor by his PCP and as well as has a referral for physical therapy. -Lidocaine patch offered to see if it helps with symptoms -Continue outpatient follow-up as needed  Hyperlipidemia -Continue atorvastatin  GERD with esophagitis -Protonix 40 mg twice daily per GI  Overactive bladder -Continue Cialis nightly  OSA: Patient reports that his OSA is mild and he is not on CPAP at night.  I will followup again tomorrow. Please contact me if I can be of assistance in the meanwhile. Thank you for this consultation.  Chief Complaint: Acid reflux  HPI:  Henry Rogers is a 71 y.o. male with medical history significant of  CAD s/p PCI, ICM, diastolic CHF, PAF, GERD with esophagitis, and OSA who presented for laparoscopic hiatal hernia repair and transtentorial incision less fundoplication for a history of reflux.  The procedure was reported to have been uneventful performed by Dr. Bryan Lemma of gastroenterology and Dr. Redmond Pulling of general surgery.  Vital signs were noted to be stable. Patient received a total of 1050 mL of IV fluids  during  the procedure with 5 cc blood loss.  Following the procedure patient reports having some substernal chest discomfort that he is feels is secondary to the procedure, but symptoms improved after being given some IV pain medicine.  I have not seen he reports that he has been dealing with some lower back pain that he describes as aching and worse with movement.  His primary care provider sent him to a chiropractor which has helped.  He has been using Tylenol at home to treat pain and reports wearing a brace helps decrease pain when walking.  Review of Systems  Respiratory:  Negative for shortness of breath.   Cardiovascular:  Positive for chest pain. Negative for leg swelling.  Musculoskeletal:  Positive for back pain.      Past Medical History:  Diagnosis Date   BPH (benign prostatic hyperplasia)    CAD (coronary artery disease)    a. inf STEMI s/p BMS to mRCA (02/03/14)  b. inf STEMI s/p PCTA to RCA with slow reflow.   Chronic diastolic CHF (congestive heart failure) ([place])    a. 2D echo on 05/18/15 LVEF 55-60%, akinesis basal inferior wall, G2DD     GERD (gastroesophageal reflux disease)    Hearing loss    Hemorrhoids    HH (hiatus hernia)    Ischemic cardiomyopathy    a. ECHO 01/2014 EF 45-50% and akinesis of the entire inferior myocardium. Mild MR.   b. now resolved (EF 55-60% on [id number])     Metabolic syndrome    Myocardial infarction ([place]) 2015   Heart attack in 2015 and 2017   OSA (obstructive sleep apnea)    a. mild not requiring CPAP at this time   PAF (paroxysmal atrial fibrillation) ([place])    a. brief episode after cardiac cath on 05/16/15- resolved after IV amiodarone.   Tinnitus    Varicose veins    Past Surgical History:  Procedure Laterality Date   CARDIAC CATHETERIZATION N/A 05/16/2015   Procedure: Left Heart Cath and Coronary Angiography;  Surgeon: Belva Crome, MD;  Location: [facility];  Service: Cardiovascular;  Laterality: N/A;   CARDIAC CATHETERIZATION N/A 05/16/2015   Procedure: Coronary Stent Intervention;  Surgeon: Belva Crome, MD;  Location: [place] [facility];  Service: Cardiovascular;  Laterality: N/A;   CARPAL TUNNEL RELEASE Bilateral 03/02/2015   Procedure: BILATERAL CARPAL TUNNEL RELEASE ;  Surgeon: Daryll Brod, MD;  Location: [facility];  Service: Orthopedics;  Laterality: Bilateral;   COLONOSCOPY  07/21/2004   Dr. Benson Norway   CORONARY ANGIOPLASTY WITH STENT PLACEMENT  02/02/2014   BMS to RCA   ENDOVENOUS ABLATION SAPHENOUS VEIN W/ LASER Right 10/12/2014   EVLA right greater saphenous vein by Curt Jews MD   LEFT HEART CATHETERIZATION WITH CORONARY ANGIOGRAM N/A 02/02/2014   Procedure: LEFT HEART CATHETERIZATION WITH CORONARY ANGIOGRAM;  Surgeon:  Peter M Martinique, MD;  Location: [facility];  Service: Cardiovascular;  Laterality: N/A;   SKIN BIOPSY Left 06/19/2021   seborrheic keratosis   stab phlebectomy  Right 03/15/2015   stab phlebectomy > 20 incisions (right leg) by Curt Jews MD   Social History:  reports that he has quit smoking. He has never used smokeless tobacco. He reports current alcohol use of about 3.0 standard drinks per week. He reports that he does not use drugs.  Allergies  Allergen Reactions   Ace Inhibitors Other (See Comments)    No personal hx reaction, but 2 family members have had persistent cough with the class and he prefers not to take ACEi.   Family History  Problem Relation Age of Onset   Hypertension Mother    Cervical cancer Mother    Breast cancer Mother    Skin cancer Mother    Pancreatic cancer Brother    Heart disease Brother    Colon cancer Neg Hx    Esophageal cancer Neg Hx     Prior to Admission medications   Medication Sig Start Date End Date Taking? Authorizing Provider  Acetaminophen 500 MG capsule Take 1,000 mg by mouth 3 (  three) times daily as needed for pain.   Yes [provider]  aspirin 81 MG tablet Take 81 mg by mouth daily.   Yes [provider]  atorvastatin (LIPITOR) 80 MG tablet TAKE 1 TABLET(80 MG) BY MOUTH DAILY AT 6 PM 03/14/21  Yes Denita Lung, MD  famotidine (PEPCID) 20 MG tablet Take 1 tablet (20 mg total) by mouth at bedtime. 02/26/21  Yes Milus Banister, MD  Melatonin 10 MG CAPS Take 10 mg by mouth at bedtime.   Yes [provider]  metoprolol tartrate (LOPRESSOR) 25 MG tablet TAKE 1 TABLET(25 MG) BY MOUTH TWICE DAILY 05/23/21  Yes Denita Lung, MD  Multiple Vitamin (MULTIVITAMIN) tablet Take 1 tablet by mouth every evening. 50 Plus   Yes [provider]  nitroGLYCERIN (NITROSTAT) 0.4 MG SL tablet Place 1 tablet (0.4 mg total) under the tongue every 5 (five) minutes as needed for chest pain. MAX 3 doses 10/28/19  Yes Martinique,  Peter M, MD  Nutritional Supplements (JUICE PLUS FIBRE PO) Take 1 tablet by mouth 2 (two) times daily. Fruit blend and vegetable blend   Yes [provider]  omega-3 acid ethyl esters (LOVAZA) 1 g capsule Take 2 capsules (2 g total) by mouth 2 (two) times daily. Patient taking differently: Take 1 g by mouth 2 (two) times daily. 08/29/20  Yes Denita Lung, MD  omeprazole (PRILOSEC) 40 MG capsule Take 1 capsule (40 mg total) by mouth daily. 02/26/21  Yes Milus Banister, MD  tadalafil (CIALIS) 5 MG tablet Take 1 tablet (5 mg total) by mouth daily. 03/14/21  Yes Denita Lung, MD  ticagrelor (BRILINTA) 60 MG TABS tablet TAKE 1 TABLET(60 MG) BY MOUTH TWICE DAILY 03/06/21  Yes Martinique, Peter M, MD  zolpidem (AMBIEN) 5 MG tablet TAKE 1 TABLET BY MOUTH AT BEDTIME AS NEEDED FOR SLEEP 07/08/16   Denita Lung, MD   Physical Exam:  Constitutional: Elderly male who appears to be in no acute distress Vitals:   07/23/21 0604 07/23/21 1016  BP: 131/87 133/88  Pulse: 65 73  Resp: 18 14  Temp: 98.1 F (36.7 C) 97.6 F (36.4 C)  TempSrc: Oral   SpO2: 95% 97%  Weight: 88 kg   Height: 5\' 6"  (1.676 m)    Eyes: PERRL, lids and conjunctivae normal ENMT: Mucous membranes are moist. Posterior pharynx clear of any exudate or lesions.  Neck: normal, supple, no masses, no thyromegaly Respiratory: clear to auscultation bilaterally, no wheezing, no crackles. Normal respiratory effort.  O2 saturation currently maintained on 2 L nasal cannula oxygen.  Nursing notes that O2 saturations never dropped below 90% off of oxygen while sleeping. Cardiovascular: Regular rate and rhythm, no murmurs / rubs / gallops. No extremity edema.  Abdomen: no tenderness, no masses palpated.  Musculoskeletal: no clubbing / cyanosis.  Skin: no rashes, lesions, ulcers. No induration Neurologic: CN 2-12 grossly intact. Sensation intact, DTR normal. Strength 5/5 in all 4.  Psychiatric: Normal judgment and insight. Alert and  oriented x 3. Normal mood.   Labs on Admission:  Basic Metabolic Panel: Recent Labs  Lab 07/17/21 1035  NA 134*  K 4.1  CL 104  CO2 24  GLUCOSE 105*  BUN 18  CREATININE 1.04  CALCIUM 9.3   Liver Function Tests: No results for input(s): AST, ALT, ALKPHOS, BILITOT, PROT, ALBUMIN in the last 168 hours. No results for input(s): LIPASE, AMYLASE in the last 168 hours. No results for input(s): AMMONIA in  the last 168 hours. CBC: Recent Labs  Lab 07/17/21 1035  WBC 5.0  HGB 15.0  HCT 44.2  MCV 93.4  PLT 208   Cardiac Enzymes: No results for input(s): CKTOTAL, CKMB, CKMBINDEX, TROPONINI in the last 168 hours. BNP: Invalid input(s): POCBNP CBG: No results for input(s): GLUCAP in the last 168 hours.  Radiological Exams on Admission: No results found.   Time spent: >45 minutes  [facility]   If 7PM-7AM, please contact night-coverage

## 2021-07-23 NOTE — Transfer of Care (Signed)
Immediate Anesthesia Transfer of Care Note  Patient: Henry Rogers  Procedure(s) Performed: LAPAROSCOPIC REPAIR OF HIATAL HERNIA, LAPOROSCOPIC  TRANSVERSE ABDOMINIS PLANE (TAP) BLOCK  (Abdomen) ESOPHAGOGASTRODUODENOSCOPY (EGD) (Abdomen) TRANSORAL INCISIONLESS FUNDOPLICATION (Abdomen)  Patient Location: PACU  Anesthesia Type:General  Level of Consciousness: drowsy, patient cooperative and responds to stimulation  Airway & Oxygen Therapy: Patient Spontanous Breathing and Patient connected to face mask oxygen  Post-op Assessment: Report given to RN and Post -op Vital signs reviewed and stable  Post vital signs: Reviewed and stable  Last Vitals:  Vitals Value Taken Time  BP 133/88 07/23/21 1016  Temp    Pulse 75 07/23/21 1018  Resp 21 07/23/21 1019  SpO2 100 % 07/23/21 1018  Vitals shown include unvalidated device data.  Last Pain:  Vitals:   07/23/21 0616  TempSrc:   PainSc: 6       Patients Stated Pain Goal: 3 (07/23/21 1540)  Complications: No notable events documented.

## 2021-07-23 NOTE — Anesthesia Postprocedure Evaluation (Signed)
Anesthesia Post Note  Patient: Henry Rogers  Procedure(s) Performed: LAPAROSCOPIC REPAIR OF HIATAL HERNIA, LAPOROSCOPIC  TRANSVERSE ABDOMINIS PLANE (TAP) BLOCK  (Abdomen) ESOPHAGOGASTRODUODENOSCOPY (EGD) (Abdomen) TRANSORAL INCISIONLESS FUNDOPLICATION (Abdomen) ESOPHAGEAL DILATION     Patient location during evaluation: PACU Anesthesia Type: General Level of consciousness: awake and alert Pain management: pain level controlled Vital Signs Assessment: post-procedure vital signs reviewed and stable Respiratory status: spontaneous breathing, nonlabored ventilation, respiratory function stable and patient connected to nasal cannula oxygen Cardiovascular status: blood pressure returned to baseline and stable Postop Assessment: no apparent nausea or vomiting Anesthetic complications: no   No notable events documented.  Last Vitals:  Vitals:   07/23/21 1031 07/23/21 1046  BP: 132/80 126/84  Pulse: 64 61  Resp: 14 16  Temp:    SpO2: 97% 96%    Last Pain:  Vitals:   07/23/21 1046  TempSrc:   PainSc: 0-No pain                 Cecile Hearing

## 2021-07-23 NOTE — Anesthesia Procedure Notes (Signed)
Procedure Name: Intubation Date/Time: 07/23/2021 8:02 AM Performed by: Glynda Jaeger, CRNA Pre-anesthesia Checklist: Patient identified, Patient being monitored, Timeout performed, Emergency Drugs available and Suction available Patient Re-evaluated:Patient Re-evaluated prior to induction Oxygen Delivery Method: Circle System Utilized Preoxygenation: Pre-oxygenation with 100% oxygen Induction Type: IV induction Ventilation: Mask ventilation without difficulty Laryngoscope Size: Mac and 4 Grade View: Grade I Tube type: Oral Tube size: 7.5 mm Number of attempts: 1 Airway Equipment and Method: Stylet Placement Confirmation: ETT inserted through vocal cords under direct vision, positive ETCO2 and breath sounds checked- equal and bilateral Secured at: 22 cm Tube secured with: Tape Dental Injury: Teeth and Oropharynx as per pre-operative assessment

## 2021-07-23 NOTE — H&P (View-Only) (Signed)
GASTROENTEROLOGY PROCEDURE H&P NOTE   Primary Care Physician: Ronnald Nian, MD    Reason for Procedure:   GERD, hiatal hernia  Plan: Laparoscopic hiatal hernia repair and Transoral Incisionless Fundoplication (TIF)  Patient is appropriate for endoscopic procedure(s) in the ambulatory ([facility]) setting.  The nature of the procedure, as well as the risks, benefits, and alternatives were carefully and thoroughly reviewed with the patient. Ample time for discussion and questions allowed. The patient understood, was satisfied, and agreed to proceed.     HPI: HRISHI EKSTROM is a 71 y.o. male with a history of CAD  s/p STEMI 2015 with RCA stent (on Brilinta, ASA), CHF (EF 55-60% in 2016), ischemic cardiomyopathy, OSA, paroxysmal A. fib (no anticoagulation), BPH, hiatal hernia, GERD, who presents for laparoscopic hiatal hernia repair and TIF for treatment of longstanding history of GERD and hiatal hernia.  Holding Brilinta x1 week for today's surgery.  Taking ASA 81 mg/day.  Has a longstanding history of GERD.  Symptoms relatively well controlled with Prilosec 40 mg every morning and 20 mg qhs.  Had tried titrating off PPI with immediate breakthrough symptoms, including nocturnal regurgitation, heartburn, waterbrash, dyspepsia, regurgitation with forward flexion.  He then restarted his Prilosec with resolution of symptoms.  He is interested in surgical options as a means to better control his reflux and as an alternative to continued long-term PPI.  Patient concerned about potential long-term ADR profile of continued PPIs.   In 02/2021, changed to Prilosec 40 mg every morning and Pepcid 20 mg qhs.  Repeat EGD with 2-3 cm HH and Hill grade 3 valve.  Subsequent esophagram with significant refluxate to thoracic inlet.   GERD history: -Index symptoms: Waterbrash, heartburn, regurgitation, dyspepsia, nocturnal regurgitation, dry cough. No dysphagia -Exacerbating features: Regurgitation with  forward flexion,  -Medications trialed: Zantac, Prilosec, Pepcid -Current medications: Prilosec 40 mg QAM, Pepcid 20 mg qhs. Sleeps with HOB elevated -Complications:    GERD evaluation: -Last EGD: 02/2021 -Barium esophagram: 02/2021: Normal motility, small HH.  Severe gastroesophageal reflux to the level of thoracic inlet -Esophageal Manometry: None -pH/Impedance: None -Bravo: None   Endoscopic History: - EGD (06/2018): Irregular but nonnodular Z-line - EGD (02/2021): 2-3 cm HH, Hill grade 3 valve    Past Medical History:  Diagnosis Date   BPH (benign prostatic hyperplasia)    CAD (coronary artery disease)    a. inf STEMI s/p BMS to mRCA (02/03/14)  b. inf STEMI s/p PCTA to RCA with slow reflow.   Chronic diastolic CHF (congestive heart failure) ([facility])    a. 2D echo on 05/18/15 LVEF 55-60%, akinesis basal inferior wall, G2DD     GERD (gastroesophageal reflux disease)    Hearing loss    Hemorrhoids    HH (hiatus hernia)    Ischemic cardiomyopathy    a. ECHO 01/2014 EF 45-50% and akinesis of the entire inferior myocardium. Mild MR.   b. now resolved (EF 55-60% on 04/22/15)     Metabolic syndrome    Myocardial infarction ([facility]) 2015   Heart attack in 2015 and 2017   OSA (obstructive sleep apnea)    a. mild not requiring CPAP at this time   PAF (paroxysmal atrial fibrillation) ([facility])    a. brief episode after cardiac cath on 05/16/15- resolved after IV amiodarone.   Tinnitus    Varicose veins     Past Surgical History:  Procedure Laterality Date   CARDIAC CATHETERIZATION N/A 05/16/2015   Procedure: Left Heart Cath and Coronary Angiography;  Surgeon: Belva Crome, MD;  Location: [facility];  Service: Cardiovascular;  Laterality: N/A;   CARDIAC CATHETERIZATION N/A 05/16/2015   Procedure: Coronary Stent Intervention;  Surgeon: Belva Crome, MD;  Location: [facility];  Service: Cardiovascular;  Laterality: N/A;   CARPAL TUNNEL RELEASE Bilateral 03/02/2015   Procedure:  BILATERAL CARPAL TUNNEL RELEASE ;  Surgeon: Daryll Brod, MD;  Location: [facility];  Service: Orthopedics;  Laterality: Bilateral;   COLONOSCOPY  07/21/2004   Dr. Benson Norway   CORONARY ANGIOPLASTY WITH STENT PLACEMENT  02/02/2014   BMS to RCA   ENDOVENOUS ABLATION SAPHENOUS VEIN W/ LASER Right 10/12/2014   EVLA right greater saphenous vein by Curt Jews MD   LEFT HEART CATHETERIZATION WITH CORONARY ANGIOGRAM N/A 02/02/2014   Procedure: LEFT HEART CATHETERIZATION WITH CORONARY ANGIOGRAM;  Surgeon: Peter M Martinique, MD;  Location: [facility];  Service: Cardiovascular;  Laterality: N/A;   SKIN BIOPSY Left 06/19/2021   seborrheic keratosis   stab phlebectomy  Right 03/15/2015   stab phlebectomy > 20 incisions (right leg) by Curt Jews MD    Prior to Admission medications   Medication Sig Start Date End Date Taking? Authorizing Provider  Acetaminophen 500 MG capsule Take 1,000 mg by mouth 3 (three) times daily as needed for pain.   Yes [provider]  aspirin 81 MG tablet Take 81 mg by mouth daily.   Yes [provider]  atorvastatin (LIPITOR) 80 MG tablet TAKE 1 TABLET(80 MG) BY MOUTH DAILY AT 6 PM 03/14/21  Yes Denita Lung, MD  famotidine (PEPCID) 20 MG tablet Take 1 tablet (20 mg total) by mouth at bedtime. 02/26/21  Yes Milus Banister, MD  Melatonin 10 MG CAPS Take 10 mg by mouth at bedtime.   Yes [provider]  metoprolol tartrate (LOPRESSOR) 25 MG tablet TAKE 1 TABLET(25 MG) BY MOUTH TWICE DAILY 05/23/21  Yes Denita Lung, MD  Multiple Vitamin (MULTIVITAMIN) tablet Take 1 tablet by mouth every evening. 50 Plus   Yes [provider]  nitroGLYCERIN (NITROSTAT) 0.4 MG SL tablet Place 1 tablet (0.4 mg total) under the tongue every 5 (five) minutes as needed for chest pain. MAX 3 doses 10/28/19  Yes Martinique, Peter M, MD  Nutritional Supplements (JUICE PLUS FIBRE PO) Take 1 tablet by mouth 2 (two) times daily. Fruit blend and vegetable blend   Yes  [provider]  omega-3 acid ethyl esters (LOVAZA) 1 g capsule Take 2 capsules (2 g total) by mouth 2 (two) times daily. Patient taking differently: Take 1 g by mouth 2 (two) times daily. 08/29/20  Yes Denita Lung, MD  omeprazole (PRILOSEC) 40 MG capsule Take 1 capsule (40 mg total) by mouth daily. 02/26/21  Yes Milus Banister, MD  tadalafil (CIALIS) 5 MG tablet Take 1 tablet (5 mg total) by mouth daily. 03/14/21  Yes Denita Lung, MD  ticagrelor (BRILINTA) 60 MG TABS tablet TAKE 1 TABLET(60 MG) BY MOUTH TWICE DAILY 03/06/21  Yes Martinique, Peter M, MD  zolpidem (AMBIEN) 5 MG tablet TAKE 1 TABLET BY MOUTH AT BEDTIME AS NEEDED FOR SLEEP 07/08/16   Denita Lung, MD    Current Facility-Administered Medications  Medication Dose Route Frequency Provider Last Rate Last Admin   0.9 %  sodium chloride infusion   Intravenous Continuous Yuriana Gaal V, DO       ceFAZolin (ANCEF) 2-4 GM/100ML-% IVPB  ceFAZolin (ANCEF) IVPB 2g/100 mL premix  2 g Intravenous On Call to OR Greer Pickerel, MD       Chlorhexidine Gluconate Cloth 2 % PADS 6 each  6 each Topical Once Greer Pickerel, MD       And   Chlorhexidine Gluconate Cloth 2 % PADS 6 each  6 each Topical Once Greer Pickerel, MD       famotidine (PEPCID) IVPB 20 mg premix  20 mg Intravenous Once Tehilla Coffel V, DO       [START ON 07/24/2021] feeding supplement (ENSURE PRE-SURGERY) liquid 296 mL  296 mL Oral Once Greer Pickerel, MD       lactated ringers infusion   Intravenous Continuous Myrtie Soman, MD       scopolamine (TRANSDERM-SCOP) 1 MG/3DAYS 1.5 mg  1 patch Transdermal Once Belina Mandile V, DO   1.5 mg at 07/23/21 [id number]    Allergies as of 06/10/2021 - Review Complete 04/19/2021  Allergen Reaction Noted   Ace inhibitors  05/18/2015    Family History  Problem Relation Age of Onset   Hypertension Mother    Cervical cancer Mother    Breast cancer Mother    Skin cancer Mother    Pancreatic cancer Brother    Heart disease  Brother    Colon cancer Neg Hx    Esophageal cancer Neg Hx     Social History   Socioeconomic History   Marital status: Married    Spouse name: Not on file   Number of children: Not on file   Years of education: Not on file   Highest education level: Not on file  Occupational History   Not on file  Tobacco Use   Smoking status: Former   Smokeless tobacco: Never  Vaping Use   Vaping Use: Never used  Substance and Sexual Activity   Alcohol use: Yes    Alcohol/week: 3.0 standard drinks    Types: 3 Glasses of wine per week   Drug use: No   Sexual activity: Not Currently  Other Topics Concern   Not on file  Social History Narrative   Not on file   Social Determinants of Health   Financial Resource Strain: Not on file  Food Insecurity: Not on file  Transportation Needs: Not on file  Physical Activity: Not on file  Stress: Not on file  Social Connections: Not on file  Intimate Partner Violence: Not on file    Physical Exam: Vital signs in last 24 hours: @BP  131/87    Pulse 65    Temp 98.1 F (36.7 C) (Oral)    Resp 18    Ht 5\' 6"  (1.676 m)    Wt 88 kg    SpO2 95%    BMI 31.31 kg/m  GEN: NAD EYE: Sclerae anicteric ENT: MMM CV: Non-tachycardic Pulm: CTA b/l GI: Soft, NT/ND NEURO:  Alert & Oriented x 3   Gerrit Heck, DO [facility]   07/23/2021 7:48 AM

## 2021-07-23 NOTE — Op Note (Signed)
[facility] Patient Name: Henry Rogers Procedure Date : 07/23/2021 MRN: [id number] Attending MD: Gerrit Heck , MD Date of Birth: Dec 12, 1950 CSN: [id number] Age: 71 Admit Type: Inpatient Procedure:                Upper GI endoscopy Indications:              Heartburn, For therapy of esophageal reflux, For                            therapy of hiatal hernia                           71 year old male with longstanding history of GERD                            and hiatal hernia presents for laparoscopic hiatal                            hernia repair and transoral incisionless                            fundoplication (cTIF). Providers:                Gerrit Heck, MD, Jeanella Cara, RN,                            Tyna Jaksch Technician, Dr. Greer Pickerel                            (co-surgeon performing laparoscopic hiatal hernia                            repair) Referring MD:              Medicines:                General Anesthesia Complications:            No immediate complications. Estimated Blood Loss:     Estimated blood loss was minimal. Procedure:                Pre-Anesthesia Assessment:                           - Prior to the procedure, a History and Physical                            was performed, and patient medications and                            allergies were reviewed. The patient's tolerance of                            previous anesthesia was also reviewed. The risks                            and benefits of the procedure and the sedation  options and risks were discussed with the patient.                            All questions were answered, and informed consent                            was obtained. Prior Anticoagulants: The patient has                            taken no previous anticoagulant or antiplatelet                            agents except for aspirin. Stopped Brilinta 7 days                             prior to surgery. ASA Grade Assessment: III - A                            patient with severe systemic disease. After                            reviewing the risks and benefits, the patient was                            deemed in satisfactory condition to undergo the                            procedure.                           After obtaining informed consent, the endoscope was                            passed under direct vision. Throughout the                            procedure, the patient's blood pressure, pulse, and                            oxygen saturations were monitored continuously. The                            [id number]-H190 [id number]) Olympus endoscope was introduced                            through the mouth, and advanced to the second part                            of duodenum. The upper GI endoscopy was                            accomplished without difficulty. The patient  tolerated the procedure well. Scope In: Scope Out: Findings:      LA Grade A (one or more mucosal breaks less than 5 mm, not extending       between tops of 2 mucosal folds) esophagitis with no bleeding was found       in the lower third of the esophagus. The previously noted hiatal hernia       has since been surgically repaired on anterograde and retroflexed views.       The decision was made to perform empiric dilation of the esophagus in       preparation for the larger-caliber Esophyx device. A guidewire was       placed and the scope was withdrawn. Dilation was performed with a Savary       dilator with no resistance at 17 mm. The decision was made to perform       transoral fundoplication with the EsophyX Z+ system. Before the       procedure, the gastroesophageal flap valve was classified as Hill Grade       I (prominent fold, tight to endoscope), consistent with successful       surgical repair of the hiatal hernia. The endoscope was withdrawn,        placed through the plication device, reinserted into the patient and       advanced past the level of the GE junction at 42 cm from the incisors       and into the stomach. Next, the endoscope was advanced beyond the device       and retroflexed. The first plication site was identified at the 1       o'clock position. With the device in the proper position, the helical       retractor was deployed and tissue was pulled into the mold before it was       closed. The device was rotated, suction was applied using the       invaginator, then the device was advanced slightly and two H-shaped       fasteners were placed. The device was reloaded and the process repeated       in order to deploy a total of 12 at the first site. The device was then       rotated to the 11 o'clock position after which the helical retractor was       used to grasp additional tissue within the mold before rotation and       deployment of a total of ten fasteners at the second site. To complete       reconstruction of the valve, additional fasteners were deployed at the       following sites: four fasteners at 5 o'clock and four fasteners at 7       o'clock positions. In total, 30 fasteners contributed to create a valve       measuring 3 cm in length which involved 300 degrees of the circumference       upon retroflexed view. The EsophyX device and endoscope were then       removed. Relook endoscopy was performed prior to the conclusion of the       case to confirm the above findings. Estimated blood loss was minimal.       The dilation site was examined following endoscope reinsertion and       showed no bleeding, mucosal tear or perforation. Estimated blood loss:  none.      A few small, benign appearing sessile polyps with no bleeding were found       in the gastric fundus and in the gastric body. Location and appearance       consistent with benign fundic gland polyps.      Normal mucosa was otherwise  found in the entire examined stomach.      The examined duodenum was normal. Impression:               - LA Grade A reflux esophagitis with no bleeding.                            Esophageal dilation with 17 mm Savary dilator                            performed then successful Transoral Incisionless                            Fundoplication with the EsophyX Z+ device.                           - A few gastric polyps.                           - Normal mucosa was found in the entire stomach.                           - Normal examined duodenum.                           - No specimens collected. Recommendation:           -Admit to surgical ward for overnight observation                            with anticipated discharge tomorrow                           -Zofran 4 mg IV every 6 hours x24 hours, then prn                           -Reglan 10 mg every 6 hours x24 hours, then prn                           -Resume scopolamine patch x3 days (applied preop)                           -Protonix 40 mg p.o. BID while inpatient, then will                            resume outpatient Prilosec 40 mg BID x2 weeks, then                            40 mg daily x2 weeks, then 20 mg daily x1 week then  prn                           -Resume Pepcid 20 mg qhs x2 weeks, then will                            discontinue                           -Decadron 8 mg every 6 hours times max 5 doses                           -Gas-X (simethicone) 80 mg p.o. prn every 6 hours                            gas pain, abdominal discomfort                           -Tylenol 3 (APAP 120 mg/codeine 12 mg per 5 mL): 15                            mL's every 4 hours prn pain                           -Colace 100 mg p.o. twice daily if taking pain                            medications                           -Clear liquid diet okay overnight                           -Okay to ambulate with assist around  the ward                           -Ok to resume ASA 81 mg/day.                           -Will plan to resume Brilinta per surgical service                            recommendations. Oko to start in 48 hours from a GI                            standpoint.                           -Please do not hesitate to contact me directly with                            any postoperative questions or concerns                           -Sincerely appreciate assistance in the overnight  admission and observation by the Hospitalist                            service. Procedure Code(s):        --- Professional ---                           [phone], Laparoscopy, surgical, repair of                            paraesophageal hernia, includes fundoplasty, when                            performed; without implantation of mesh                           [id number], Esophagogastroduodenoscopy, flexible,                            transoral; with insertion of guide wire followed by                            passage of dilator(s) through esophagus over guide                            wire Diagnosis Code(s):        --- Professional ---                           K21.00, Gastro-esophageal reflux disease with                            esophagitis, without bleeding                           K31.7, Polyp of stomach and duodenum                           R12, Heartburn                           K44.9, Diaphragmatic hernia without obstruction or                            gangrene CPT copyright 2019 [employer]. All rights reserved. The codes documented in this report are preliminary and upon coder review may  be revised to meet current compliance requirements. Gerrit Heck, MD 07/23/2021 10:41:09 AM Number of Addenda: 0

## 2021-07-23 NOTE — Consult Note (Signed)
° °GASTROENTEROLOGY PROCEDURE H&P NOTE  ° °Primary Care Physician: °[name], [name], MD ° ° ° °Reason for Procedure:   GERD, hiatal hernia ° °Plan: Laparoscopic hiatal hernia repair and Transoral Incisionless Fundoplication (TIF) ° °Patient is appropriate for endoscopic procedure(s) in the ambulatory ([facility]) setting. ° °The nature of the procedure, as well as the risks, benefits, and alternatives were carefully and thoroughly reviewed with the patient. Ample time for discussion and questions allowed. The patient understood, was satisfied, and agreed to proceed.  ° ° ° °HPI: °Henry Rogers is a 71 y.o. male with a history of CAD  s/p STEMI 2015 with RCA stent (on Brilinta, ASA), CHF (EF 55-60% in 2016), ischemic cardiomyopathy, OSA, paroxysmal A. fib (no anticoagulation), BPH, hiatal hernia, GERD, who presents for laparoscopic hiatal hernia repair and TIF for treatment of longstanding history of GERD and hiatal hernia. ° °Holding Brilinta x1 week for today's surgery.  Taking ASA 81 mg/day. ° °Has a longstanding history of GERD.  Symptoms relatively well controlled with Prilosec 40 mg every morning and 20 mg qhs.  Had tried titrating off PPI with immediate breakthrough symptoms, including nocturnal regurgitation, heartburn, waterbrash, dyspepsia, regurgitation with forward flexion.  He then restarted his Prilosec with resolution of symptoms.  He is interested in surgical options as a means to better control his reflux and as an alternative to continued long-term PPI.  Patient concerned about potential long-term ADR profile of continued PPIs. °  °In 02/2021, changed to Prilosec 40 mg every morning and Pepcid 20 mg qhs.  Repeat EGD with 2-3 cm HH and Hill grade 3 valve.  Subsequent esophagram with significant refluxate to thoracic inlet. °  °GERD history: °-Index symptoms: Waterbrash, heartburn, regurgitation, dyspepsia, nocturnal regurgitation, dry cough. No dysphagia °-Exacerbating features: Regurgitation with  forward flexion,  °-Medications trialed: Zantac, Prilosec, Pepcid °-Current medications: Prilosec 40 mg QAM, Pepcid 20 mg qhs. Sleeps with HOB elevated °-Complications:  °  °GERD evaluation: °-Last EGD: 02/2021 °-Barium esophagram: 02/2021: Normal motility, small HH.  Severe gastroesophageal reflux to the level of thoracic inlet °-Esophageal Manometry: None °-pH/Impedance: None °-Bravo: None °  °Endoscopic History: °- EGD (06/2018): Irregular but nonnodular Z-line °- EGD (02/2021): 2-3 cm HH, Hill grade 3 valve °  ° °Past Medical History:  °Diagnosis Date  ° BPH (benign prostatic hyperplasia)   ° CAD (coronary artery disease)   ° a. inf STEMI s/p BMS to mRCA (02/03/14)  b. inf STEMI s/p PCTA to RCA with slow reflow.  ° Chronic diastolic CHF (congestive heart failure) (HCC)   ° a. 2D echo on 05/18/15 LVEF 55-60%, akinesis basal inferior wall, G2DD    ° GERD (gastroesophageal reflux disease)   ° Hearing loss   ° Hemorrhoids   ° HH (hiatus hernia)   ° Ischemic cardiomyopathy   ° a. ECHO 01/2014 EF 45-50% and akinesis of the entire inferior myocardium. Mild MR.   b. now resolved (EF 55-60% on 04/22/15)    ° Metabolic syndrome   ° Myocardial infarction (HCC) 2015  ° Heart attack in 2015 and 2017  ° OSA (obstructive sleep apnea)   ° a. mild not requiring CPAP at this time  ° PAF (paroxysmal atrial fibrillation) (HCC)   ° a. brief episode after cardiac cath on 05/16/15- resolved after IV amiodarone.  ° Tinnitus   ° Varicose veins   ° ° °Past Surgical History:  °Procedure Laterality Date  ° CARDIAC CATHETERIZATION N/A 05/16/2015  ° Procedure: Left Heart Cath and Coronary Angiography;    Surgeon: Belva Crome, MD;  Location: [facility];  Service: Cardiovascular;  Laterality: N/A;   CARDIAC CATHETERIZATION N/A 05/16/2015   Procedure: Coronary Stent Intervention;  Surgeon: Belva Crome, MD;  Location: [facility];  Service: Cardiovascular;  Laterality: N/A;   CARPAL TUNNEL RELEASE Bilateral 03/02/2015   Procedure:  BILATERAL CARPAL TUNNEL RELEASE ;  Surgeon: Daryll Brod, MD;  Location: [facility];  Service: Orthopedics;  Laterality: Bilateral;   COLONOSCOPY  07/21/2004   Dr. Benson Norway   CORONARY ANGIOPLASTY WITH STENT PLACEMENT  02/02/2014   BMS to RCA   ENDOVENOUS ABLATION SAPHENOUS VEIN W/ LASER Right 10/12/2014   EVLA right greater saphenous vein by Curt Jews MD   LEFT HEART CATHETERIZATION WITH CORONARY ANGIOGRAM N/A 02/02/2014   Procedure: LEFT HEART CATHETERIZATION WITH CORONARY ANGIOGRAM;  Surgeon: Peter M Martinique, MD;  Location: [facility];  Service: Cardiovascular;  Laterality: N/A;   SKIN BIOPSY Left 06/19/2021   seborrheic keratosis   stab phlebectomy  Right 03/15/2015   stab phlebectomy > 20 incisions (right leg) by Curt Jews MD    Prior to Admission medications   Medication Sig Start Date End Date Taking? Authorizing Provider  Acetaminophen 500 MG capsule Take 1,000 mg by mouth 3 (three) times daily as needed for pain.   Yes [provider]  aspirin 81 MG tablet Take 81 mg by mouth daily.   Yes [provider]  atorvastatin (LIPITOR) 80 MG tablet TAKE 1 TABLET(80 MG) BY MOUTH DAILY AT 6 PM 03/14/21  Yes Denita Lung, MD  famotidine (PEPCID) 20 MG tablet Take 1 tablet (20 mg total) by mouth at bedtime. 02/26/21  Yes Milus Banister, MD  Melatonin 10 MG CAPS Take 10 mg by mouth at bedtime.   Yes [provider]  metoprolol tartrate (LOPRESSOR) 25 MG tablet TAKE 1 TABLET(25 MG) BY MOUTH TWICE DAILY 05/23/21  Yes Denita Lung, MD  Multiple Vitamin (MULTIVITAMIN) tablet Take 1 tablet by mouth every evening. 50 Plus   Yes [provider]  nitroGLYCERIN (NITROSTAT) 0.4 MG SL tablet Place 1 tablet (0.4 mg total) under the tongue every 5 (five) minutes as needed for chest pain. MAX 3 doses 10/28/19  Yes Martinique, Peter M, MD  Nutritional Supplements (JUICE PLUS FIBRE PO) Take 1 tablet by mouth 2 (two) times daily. Fruit blend and vegetable blend   Yes  [provider]  omega-3 acid ethyl esters (LOVAZA) 1 g capsule Take 2 capsules (2 g total) by mouth 2 (two) times daily. Patient taking differently: Take 1 g by mouth 2 (two) times daily. 08/29/20  Yes Denita Lung, MD  omeprazole (PRILOSEC) 40 MG capsule Take 1 capsule (40 mg total) by mouth daily. 02/26/21  Yes Milus Banister, MD  tadalafil (CIALIS) 5 MG tablet Take 1 tablet (5 mg total) by mouth daily. 03/14/21  Yes Denita Lung, MD  ticagrelor (BRILINTA) 60 MG TABS tablet TAKE 1 TABLET(60 MG) BY MOUTH TWICE DAILY 03/06/21  Yes Martinique, Peter M, MD  zolpidem (AMBIEN) 5 MG tablet TAKE 1 TABLET BY MOUTH AT BEDTIME AS NEEDED FOR SLEEP 07/08/16   Denita Lung, MD    Current Facility-Administered Medications  Medication Dose Route Frequency Provider Last Rate Last Admin   0.9 %  sodium chloride infusion   Intravenous Continuous Celestine Bougie V, DO       ceFAZolin (ANCEF) 2-4 GM/100ML-% IVPB  ceFAZolin (ANCEF) IVPB 2g/100 mL premix  2 g Intravenous On Call to OR Greer Pickerel, MD       Chlorhexidine Gluconate Cloth 2 % PADS 6 each  6 each Topical Once Greer Pickerel, MD       And   Chlorhexidine Gluconate Cloth 2 % PADS 6 each  6 each Topical Once Greer Pickerel, MD       famotidine (PEPCID) IVPB 20 mg premix  20 mg Intravenous Once Marolyn Urschel V, DO       [START ON 07/24/2021] feeding supplement (ENSURE PRE-SURGERY) liquid 296 mL  296 mL Oral Once Greer Pickerel, MD       lactated ringers infusion   Intravenous Continuous Myrtie Soman, MD       scopolamine (TRANSDERM-SCOP) 1 MG/3DAYS 1.5 mg  1 patch Transdermal Once Nazar Kuan V, DO   1.5 mg at 07/23/21 [id number]    Allergies as of 06/10/2021 - Review Complete 04/19/2021  Allergen Reaction Noted   Ace inhibitors  05/18/2015    Family History  Problem Relation Age of Onset   Hypertension Mother    Cervical cancer Mother    Breast cancer Mother    Skin cancer Mother    Pancreatic cancer Brother    Heart disease  Brother    Colon cancer Neg Hx    Esophageal cancer Neg Hx     Social History   Socioeconomic History   Marital status: Married    Spouse name: Not on file   Number of children: Not on file   Years of education: Not on file   Highest education level: Not on file  Occupational History   Not on file  Tobacco Use   Smoking status: Former   Smokeless tobacco: Never  Vaping Use   Vaping Use: Never used  Substance and Sexual Activity   Alcohol use: Yes    Alcohol/week: 3.0 standard drinks    Types: 3 Glasses of wine per week   Drug use: No   Sexual activity: Not Currently  Other Topics Concern   Not on file  Social History Narrative   Not on file   Social Determinants of Health   Financial Resource Strain: Not on file  Food Insecurity: Not on file  Transportation Needs: Not on file  Physical Activity: Not on file  Stress: Not on file  Social Connections: Not on file  Intimate Partner Violence: Not on file    Physical Exam: Vital signs in last 24 hours: @BP  131/87    Pulse 65    Temp 98.1 F (36.7 C) (Oral)    Resp 18    Ht 5\' 6"  (1.676 m)    Wt 88 kg    SpO2 95%    BMI 31.31 kg/m  GEN: NAD EYE: Sclerae anicteric ENT: MMM CV: Non-tachycardic Pulm: CTA b/l GI: Soft, NT/ND NEURO:  Alert & Oriented x 3   Gerrit Heck, DO [facility]   07/23/2021 7:48 AM

## 2021-07-23 NOTE — H&P (Signed)
CC: here for surgery  Requesting provider: dr Barron Alvine  HPI: Henry Rogers is an 71 y.o. male who is here for combined TIF with laparoscopic hiatal hernia repair. Stopped brilinta 7 days ago.  Only new thing has been compressed disc in lower back causing some b/l sciatica.   Otherwise no changes to foregut.  Henry Rogers is a 71 y.o. male who is seen today as an office consultation at the request of Dr. Barron Alvine for evaluation of New Consultation (Hiatal Hernia) .  He is accompanied by his wife. He states that he has had heartburn for many years. Right now he states his heartburn is pretty much well controlled. He may have some occasional breakthrough episodes. For instance he stayed up late last night watching the election and had chips and salsa and he woke up in all the night with chest tightness pressure in his upper abdomen. He states that he did tried to wean himself off his heartburn medication earlier in the year and he had complete return of all his symptoms. If he does not take his medication he reports that he will have severe epigastric pain, he will wake up middle the night, he will have occasional retching and to have a sensation of reflux up into his throat. His current regimen is taking 20 mg of famotidine in the morning and 40 of omeprazole in the evening. No prior abdominal surgery. No personal history of blood clots. No tobacco use. He saw Dr. [place] with cardiology and received cardiac clearance. He is on Brilinta and had permission to hold his Brilinta for about 1 week prior to surgery but preferably continue his aspirin perioperatively. Denies any sensation of anything getting stuck with eating or drinking. No pain with eating or drinking.  In 02/2021, changed to Prilosec 40 mg every morning and Pepcid 20 mg qhs. Repeat EGD with 2-3 cm HH and Hill grade 3 valve. Subsequent esophagram with significant refluxate to thoracic inlet.   GERD history: -Index symptoms:  Waterbrash, heartburn, regurgitation, dyspepsia, nocturnal regurgitation, dry cough. No dysphagia -Exacerbating features: Regurgitation with forward flexion,  -Medications trialed: Zantac, Prilosec, Pepcid -Current medications: Prilosec 40 mg QAM, Pepcid 20 mg qhs. Sleeps with HOB elevated -Complications:    GERD evaluation: -Last EGD: 02/2021 -Barium esophagram: 02/2021: Normal motility, small HH. Severe gastroesophageal reflux to the level of thoracic inlet -Esophageal Manometry: None -pH/Impedance: None -Bravo: None   Endoscopic History: - EGD (06/2018): Irregular but nonnodular Z-line - EGD (02/2021): 2-3 cm HH, Hill grade 3 valve    GERD-HRQL Questionnaire Score: 25/50 (when off PPI); marked "dissatisfied" with current health condition    Past Medical History:  Diagnosis Date   BPH (benign prostatic hyperplasia)    CAD (coronary artery disease)    a. inf STEMI s/p BMS to mRCA (02/03/14)  b. inf STEMI s/p PCTA to RCA with slow reflow.   Chronic diastolic CHF (congestive heart failure) (HCC)    a. 2D echo on 05/18/15 LVEF 55-60%, akinesis basal inferior wall, G2DD     GERD (gastroesophageal reflux disease)    Hearing loss    Hemorrhoids    HH (hiatus hernia)    Ischemic cardiomyopathy    a. ECHO 01/2014 EF 45-50% and akinesis of the entire inferior myocardium. Mild MR.   b. now resolved (EF 55-60% on 04/22/15)     Metabolic syndrome    Myocardial infarction (HCC) 2015   Heart attack in 2015 and 2017   OSA (obstructive sleep apnea)  a. mild not requiring CPAP at this time   PAF (paroxysmal atrial fibrillation) ([place])    a. brief episode after cardiac cath on 05/16/15- resolved after IV amiodarone.   Tinnitus    Varicose veins     Past Surgical History:  Procedure Laterality Date   CARDIAC CATHETERIZATION N/A 05/16/2015   Procedure: Left Heart Cath and Coronary Angiography;  Surgeon: Belva Crome, MD;  Location: [facility];  Service: Cardiovascular;  Laterality:  N/A;   CARDIAC CATHETERIZATION N/A 05/16/2015   Procedure: Coronary Stent Intervention;  Surgeon: Belva Crome, MD;  Location: [facility];  Service: Cardiovascular;  Laterality: N/A;   CARPAL TUNNEL RELEASE Bilateral 03/02/2015   Procedure: BILATERAL CARPAL TUNNEL RELEASE ;  Surgeon: Daryll Brod, MD;  Location: [facility];  Service: Orthopedics;  Laterality: Bilateral;   COLONOSCOPY  07/21/2004   Dr. Benson Norway   CORONARY ANGIOPLASTY WITH STENT PLACEMENT  02/02/2014   BMS to RCA   ENDOVENOUS ABLATION SAPHENOUS VEIN W/ LASER Right 10/12/2014   EVLA right greater saphenous vein by Curt Jews MD   LEFT HEART CATHETERIZATION WITH CORONARY ANGIOGRAM N/A 02/02/2014   Procedure: LEFT HEART CATHETERIZATION WITH CORONARY ANGIOGRAM;  Surgeon: Peter M Martinique, MD;  Location: [facility];  Service: Cardiovascular;  Laterality: N/A;   SKIN BIOPSY Left 06/19/2021   seborrheic keratosis   stab phlebectomy  Right 03/15/2015   stab phlebectomy > 20 incisions (right leg) by Curt Jews MD    Family History  Problem Relation Age of Onset   Hypertension Mother    Cervical cancer Mother    Breast cancer Mother    Skin cancer Mother    Pancreatic cancer Brother    Heart disease Brother    Colon cancer Neg Hx    Esophageal cancer Neg Hx     Social:  reports that he has quit smoking. He has never used smokeless tobacco. He reports current alcohol use of about 3.0 standard drinks per week. He reports that he does not use drugs.  Allergies:  Allergies  Allergen Reactions   Ace Inhibitors Other (See Comments)    No personal hx reaction, but 2 family members have had persistent cough with the class and he prefers not to take ACEi.    Medications: I have reviewed the patient's current medications.   ROS - all of the below systems have been reviewed with the patient and positives are indicated with bold text General: chills, fever or night sweats Eyes: blurry vision or double  vision ENT: epistaxis or sore throat Allergy/Immunology: itchy/watery eyes or nasal congestion Hematologic/Lymphatic: bleeding problems, blood clots or swollen lymph nodes Endocrine: temperature intolerance or unexpected weight changes Breast: new or changing breast lumps or nipple discharge Resp: cough, shortness of breath, or wheezing CV: chest pain or dyspnea on exertion GI: as per HPI GU: dysuria, trouble voiding, or hematuria MSK: joint pain or joint stiffness Neuro: TIA or stroke symptoms Derm: pruritus and skin lesion changes Psych: anxiety and depression  PE Blood pressure 131/87, pulse 65, temperature 98.1 F (36.7 C), temperature source Oral, resp. rate 18, height 5\' 6"  (1.676 m), weight 88 kg, SpO2 95 %. Constitutional: NAD; conversant; no deformities Eyes: Moist conjunctiva; no lid lag; anicteric; PERRL Neck: Trachea midline; no thyromegaly Lungs: Normal respiratory effort; no tactile fremitus CV: RRR; no palpable thrills; no pitting edema GI: Abd soft, nt; no palpable hepatosplenomegaly MSK: Normal gait; no clubbing/cyanosis Psychiatric: Appropriate affect; alert and oriented x3 Lymphatic:  No palpable cervical or axillary lymphadenopathy Skin:no redness, lesion  No results found for this or any previous visit (from the past 70 hour(s)).  No results found.  Imaging: reviewed  A/P: Henry Rogers is an 71 y.o. male with  Gastroesophageal reflux disease without esophagitis  Hiatus hernia syndrome  Coronary artery disease involving native coronary artery of native heart without angina pectoris  PAF (paroxysmal atrial fibrillation) (CMS-HCC)  TO or for lap HHR with TIF  Preop tylenol, nausea, subcu heparin  All questions asked and answered.   Leighton Ruff. Redmond Pulling, MD, FACS General, Bariatric, & Minimally Invasive Surgery [facility], [place]

## 2021-07-24 ENCOUNTER — Encounter (HOSPITAL_COMMUNITY): Payer: Self-pay | Admitting: General Surgery

## 2021-07-24 ENCOUNTER — Telehealth: Payer: Self-pay

## 2021-07-24 DIAGNOSIS — Z9889 Other specified postprocedural states: Secondary | ICD-10-CM

## 2021-07-24 DIAGNOSIS — I480 Paroxysmal atrial fibrillation: Secondary | ICD-10-CM | POA: Diagnosis not present

## 2021-07-24 DIAGNOSIS — I5032 Chronic diastolic (congestive) heart failure: Secondary | ICD-10-CM | POA: Diagnosis not present

## 2021-07-24 DIAGNOSIS — I2510 Atherosclerotic heart disease of native coronary artery without angina pectoris: Secondary | ICD-10-CM | POA: Diagnosis not present

## 2021-07-24 DIAGNOSIS — K219 Gastro-esophageal reflux disease without esophagitis: Secondary | ICD-10-CM | POA: Diagnosis not present

## 2021-07-24 DIAGNOSIS — K2100 Gastro-esophageal reflux disease with esophagitis, without bleeding: Secondary | ICD-10-CM | POA: Diagnosis not present

## 2021-07-24 DIAGNOSIS — Z87891 Personal history of nicotine dependence: Secondary | ICD-10-CM | POA: Diagnosis not present

## 2021-07-24 DIAGNOSIS — K449 Diaphragmatic hernia without obstruction or gangrene: Secondary | ICD-10-CM | POA: Diagnosis not present

## 2021-07-24 MED ORDER — LIDOCAINE 5 % EX PTCH: 1.0000 | MEDICATED_PATCH | CUTANEOUS | 0 refills | Status: DC

## 2021-07-24 MED ORDER — OMEPRAZOLE 40 MG PO CPDR: 40.0000 mg | DELAYED_RELEASE_CAPSULE | Freq: Two times a day (BID) | ORAL | 0 refills | Status: DC

## 2021-07-24 MED ORDER — LIDOCAINE VISCOUS HCL 2 % MT SOLN: 15.0000 mL | Freq: Four times a day (QID) | OROMUCOSAL | 0 refills | Status: DC | PRN

## 2021-07-24 MED ORDER — METOCLOPRAMIDE HCL 10 MG PO TABS: 10.0000 mg | ORAL_TABLET | Freq: Four times a day (QID) | ORAL | 0 refills | Status: DC | PRN

## 2021-07-24 MED ORDER — SIMETHICONE 80 MG PO CHEW: 80.0000 mg | CHEWABLE_TABLET | Freq: Four times a day (QID) | ORAL | 0 refills | Status: DC | PRN

## 2021-07-24 MED ORDER — OXYCODONE HCL 5 MG PO TABS: 5.0000 mg | ORAL_TABLET | Freq: Three times a day (TID) | ORAL | 0 refills | Status: DC | PRN

## 2021-07-24 MED ORDER — TICAGRELOR 60 MG PO TABS: ORAL_TABLET | ORAL | 2 refills | Status: DC

## 2021-07-24 MED ORDER — ONDANSETRON HCL 4 MG PO TABS: 4.0000 mg | ORAL_TABLET | Freq: Four times a day (QID) | ORAL | 0 refills | Status: DC | PRN

## 2021-07-24 NOTE — Discharge Instructions (Addendum)
Diet:  - 2 weeks of liquid soft foods followed by 4 weeks slowly progressive diet back to regular  - Provided with handout for post operative diet plan    Post Op Activity:  - Week 1: encourage short distance walking, minimal physical activity, no lifting >5 lbs  - Week 2: Slow climbing stairs, no intense exercise, no lifting >5 lbs  - Week 3-6: No intense exercise, may lift up to 25 lbs  - Week 7: Resume normal activity    Eating techniques 20-20-20 (30-30-30) 20 chews, 20 seconds between bites of food, 20 minutes to eat; sometimes you may need 30 chews, 30 seconds etc Use your nondominant hand to eat with Put fork down between bites of food Use a timer after swallowing to reinforce waiting 20-30 sec between bites of food Use a child/infant size utensil Try not to eat while watching TV   [facility]: POST OP INSTRUCTIONS Always review your discharge instruction sheet given to you by the facility where your surgery was performed. IF YOU HAVE DISABILITY OR FAMILY LEAVE FORMS, YOU MUST BRING THEM TO THE OFFICE FOR PROCESSING.   DO NOT GIVE THEM TO YOUR DOCTOR.  PAIN CONTROL  First take acetaminophen (Tylenol) AND/or ibuprofen (Advil) to control your pain after surgery.  Follow directions on package.  Taking acetaminophen (Tylenol) and/or ibuprofen (Advil) regularly after surgery will help to control your pain and lower the amount of prescription pain medication you may need.  You should not take more than 3,000 mg (3 grams) of acetaminophen (Tylenol) in 24 hours.  You should not take ibuprofen (Advil), aleve, motrin, naprosyn or other NSAIDS if you have a history of stomach ulcers or chronic kidney disease.  A prescription for pain medication may be given to you upon discharge.  Take your pain medication as prescribed, if you still have uncontrolled pain after taking acetaminophen (Tylenol) or ibuprofen (Advil). Use ice packs to help control  pain. If you need a refill on your pain medication, please contact your pharmacy.  They will contact our office to request authorization. Prescriptions will not be filled after 5pm or on week-ends.  HOME MEDICATIONS Take your usually prescribed medications unless otherwise directed.  DIET See Dr Bryan Lemma diet plan   CONSTIPATION It is common to experience some constipation after surgery and if you are taking pain medication.  Increasing fluid intake and taking a stool softener (such as Colace) will usually help or prevent this problem from occurring.  A mild laxative (Milk of Magnesia or Miralax) should be taken according to package instructions if there are no bowel movements after 48 hours.  WOUND/INCISION CARE Most patients will experience some swelling and bruising in the area of the incisions.  Ice packs will help.  Swelling and bruising can take several days to resolve.  Unless discharge instructions indicate otherwise, follow guidelines below  STERI-STRIPS - you may remove your outer bandages 48 hours after surgery, and you may shower at that time.  You have steri-strips (small skin tapes) in place directly over the incision.  These strips should be left on the skin for 7-10 days.   DERMABOND/SKIN GLUE - you may shower in 24 hours.  The glue will flake off over the next 2-3 weeks. Any sutures or staples will be removed at the office during your follow-up visit.  ACTIVITIES You may resume regular (light) daily activities beginning the next day--such as daily self-care, walking, climbing stairs--gradually increasing activities as tolerated.  You may have sexual intercourse when it is comfortable.  Refrain from any heavy lifting or straining until approved by your doctor. You may drive when you are no longer taking prescription pain medication, you can comfortably wear a seatbelt, and you can safely maneuver your car and apply brakes.  FOLLOW-UP You should see your doctor in the office  for a follow-up appointment approximately 2-3 weeks after your surgery.  You should have been given your post-op/follow-up appointment when your surgery was scheduled.  If you did not receive a post-op/follow-up appointment, make sure that you call for this appointment within a day or two after you arrive home to insure a convenient appointment time.  OTHER INSTRUCTIONS   WHEN TO CALL YOUR DOCTOR: Fever over 101.0 Inability to urinate Continued bleeding from incision. Increased pain, redness, or drainage from the incision. Increasing abdominal pain  The clinic staff is available to answer your questions during regular business hours.  Please dont hesitate to call and ask to speak to one of the nurses for clinical concerns.  If you have a medical emergency, go to the nearest emergency room or call 911.  A surgeon from [facility] is always on call at the hospital. [address], [place], [place], [place]  [address] ? [address], [place], [place]   [address] [phone] ? [phone] ? FAX [phone] Web site: [email or web address]

## 2021-07-24 NOTE — Progress Notes (Signed)
Discharge instructions (including medications) discussed with and copy provided to patient/caregiver 

## 2021-07-24 NOTE — Progress Notes (Signed)
[facility] ROUNDING NOTE   Subjective: Laparoscopic hiatal hernia repair and TIF completed yesterday without complications.  Did have chest pain overnight.  EKG reviewed by the Hospitalist and normal, no acute changes.  Pain has since resolved.  Otherwise, patient did well overnight without any acute events.  Tolerating liquids without issue.  Eager for discharge home later today.  Objective: Vital signs in last 24 hours: Temp:  [97.5 F (36.4 C)-98.4 F (36.9 C)] 97.8 F (36.6 C) (01/04 0730) Pulse Rate:  [59-100] 100 (01/04 0730) Resp:  [11-18] 17 (01/04 0730) BP: (119-146)/(72-95) 131/84 (01/04 0730) SpO2:  [91 %-99 %] 94 % (01/04 0730) FiO2 (%):  [21 %] 21 % (01/03 1402)   General: NAD, awake and well conversive Ext: No c/c/e    Intake/Output from previous day: 01/03 0701 - 01/04 0700 In: 1100 [P.O.:50; I.V.:900; IV Piggyback:150] Out: 5 [Blood:5] Intake/Output this shift: No intake/output data recorded.   Lab Results: Recent Labs    07/23/21 1416  WBC 10.1  HGB 14.6  PLT 199  MCV 93.5   BMET Recent Labs    07/23/21 1416  NA 135  K 3.7  CL 104  CO2 25  GLUCOSE 134*  BUN 15  CREATININE 1.07  CALCIUM 8.9   LFT No results for input(s): PROT, ALBUMIN, AST, ALT, ALKPHOS, BILITOT, BILIDIR, IBILI in the last 72 hours. PT/INR No results for input(s): INR in the last 72 hours.    Imaging/Other results: No results found.    Assessment and Plan:   Impression and Recommendations:  Henry Rogers is a 71 y.o. male s/p laparoscopic hiatal hernia repair and EGD with Transoral Incisionless Fundoplication (TIF) completed yesterday with no events on overnight observation. Ok for d/c to home today with the following plan:   - Continue home Prilosec 40 mg PO BID for 2 weeks, then 40 mg daily for 2 weeks, then 20 mg daily for 1 week, then prn - Continue Pepcid 20 mg qhs x2 weeks, then can change to prn - Discharge with Zofran 4 mg PO prn Q6 hours  for nausea  - Discharge with Reglan 10 mg PO prn Q6 hours for nausea  - Discharge with Simethicone 80 mg PO prn Q6 hours for bloating/abdominal discomfort - Discharge with viscous lidocaine - Ok to restart Brilinta in 2 days from a GI standpoint.  Will d/w Dr. Andrey Campanile to make sure okay to start from a surgical standpoint at that time as well. - Ok to resume ASA 81 mg/day  Diet:  - 2 weeks of liquid soft foods followed by 4 weeks slowly progressive diet back to regular  - Provided with handout for post operative diet plan   Post Op Activity:  - Week 1: encourage short distance walking, minimal physical activity, no lifting >5 lbs  - Week 2: Slow climbing stairs, no intense exercise, no lifting >5 lbs  - Week 3-6: No intense exercise, may lift up to 25 lbs  - Week 7: Resume normal activity   - To follow-up with me on 08/06/2021 at 9:00 AM in the [facility] at [facility] - Sincerely appreciate the assistance by the Hospital service in the admission and overnight observation of this patient.   Shellia Cleverly, DO  07/24/2021, 7:31 AM [facility] Pager [phone]

## 2021-07-24 NOTE — Telephone Encounter (Signed)
Called and spoke with patient. I asked him how he was feeling today, he stated "so far, so good". He states that he is having some gas and aching as expected. He states that his wife is making sure that he is taking all of his medications on time. He is tolerating liquids ok at this time. Pt knows to call if anything changes. He wanted to thank you for getting this procedure set up.

## 2021-07-24 NOTE — Progress Notes (Signed)
Reviewed chart/vitals/other notes  Informed by gastroenterology that the patient was doing well and was being discharged early this morning.  Unfortunately my schedule precluded me from getting over there prior to the patient being discharged.  I called the patient on his cell phone and spoke with him and his wife and reviewed his hospitalization, intraoperative findings.  He states that he did have some chest discomfort overnight.  Told him that was not unusual.  Discussed eating techniques and behaviors for the next 6 to 12 weeks.  Discussed the diet progression that gastroenterology has laid out for him which I agree with.  Also discussed some eating tips and techniques such as drinking slowly, taking a small bite, chewing 20-30 times, waiting 20 to 30 seconds between bites, eating with nondominant hand, putting fork down between bites.  Be careful with cold liquids which may cause the esophagus to spasm.  Also discussed wound care instructions.  Discussed the right mid abdominal incision and how that can cause some discomfort.  Discussed potential constipation and how to manage that.  All their questions were asked and answered.  Patient needs to see me in 3 to 4 weeks for follow-up  Mary Sella. Andrey Campanile, MD, FACS General, Bariatric, & Minimally Invasive Surgery [facility], [place]

## 2021-07-24 NOTE — Plan of Care (Signed)
  Problem: Education: Goal: Knowledge of General Education information will improve Description: Including pain rating scale, medication(s)/side effects and non-pharmacologic comfort measures Outcome: Adequate for Discharge   

## 2021-07-24 NOTE — Discharge Summary (Signed)
Physician Discharge Summary  Henry Rogers [id number] DOB: Jun 28, 1951 DOA: 07/23/2021  PCP: Denita Lung, MD  Admit date: 07/23/2021 Discharge date: 07/24/2021  Admitted From: home Disposition:  home  Recommendations for Outpatient Follow-up:  Follow up with Dr. Bryan Lemma in 2 weeks as scheduled  Home Health: none Equipment/Devices: none  Discharge Condition: stable CODE STATUS: Full code Diet recommendation: soft  HPI: Per admitting MD, Henry Rogers is a 71 y.o. male with medical history significant of  CAD s/p PCI, ICM, diastolic CHF, PAF, GERD with esophagitis, and OSA who presented for laparoscopic hiatal hernia repair and transtentorial incision less fundoplication for a history of reflux.  The procedure was reported to have been uneventful performed by Dr. Bryan Lemma of gastroenterology and Dr. Redmond Pulling of general surgery.  Vital signs were noted to be stable. Patient received a total of 1050 mL of IV fluids  during  the procedure with 5 cc blood loss.  Following the procedure patient reports having some substernal chest discomfort that he is feels is secondary to the procedure, but symptoms improved after being given some IV pain medicine.  I have not seen he reports that he has been dealing with some lower back pain that he describes as aching and worse with movement.  His primary care provider sent him to a chiropractor which has helped.  He has been using Tylenol at home to treat pain and reports wearing a brace helps decrease pain when walking.  Hospital Course / Discharge diagnoses: Principal problem Hiatal hernia with GERD and esophagitis -patient was admitted to the hospital and is status post laparoscopic hiatal hernia repair and transtentorial incision less fundoplication for a history of reflux by Dr. Bryan Lemma of gastroenterology and Dr. Redmond Pulling of general surgery.  He recovered well postoperatively, evaluated by GI next morning, he is doing well, abdominal  discomfort is improving be discharged home in stable condition with outpatient follow-up.  Active problems Essential hypertension-resume home medications CAD-Patient with prior history of inferior STEMI s/p LHC 01/2014 requiring bare-metal stent placement to the mid RCA.  Patient had subsequent inferior ST elevation myocardial infarction 04/2015 requiring PCI.  He has been on dual antiplatelet therapy, but Brilinta was held for 7 days prior to the procedure.  Resume Brilinta 2 days after discharge, aspirin can be resumed upon discharge Chronic diastolic CHF-euvolemic.  Last EF noted to be  55 -60% with grade 2 diastolic dysfunction back in 04/2015. Paroxysmal A. fib-post cath 2016, now resolved.  Outpatient follow-up Degenerative disc disease, L-spine-outpatient follow-up.  Lidocaine patches prescribed on discharge as they have been helping Hyperlipidemia-continue medications OSA-mild, not on CPAP  Sepsis ruled out   Discharge Instructions   Allergies as of 07/24/2021       Reactions   Ace Inhibitors Other (See Comments)   No personal hx reaction, but 2 family members have had persistent cough with the class and he prefers not to take ACEi.        Medication List     TAKE these medications    Acetaminophen 500 MG capsule Take 1,000 mg by mouth 3 (three) times daily as needed for pain.   aspirin 81 MG tablet Take 81 mg by mouth daily.   atorvastatin 80 MG tablet Commonly known as: LIPITOR TAKE 1 TABLET(80 MG) BY MOUTH DAILY AT 6 PM   famotidine 20 MG tablet Commonly known as: Pepcid Take 1 tablet (20 mg total) by mouth at bedtime.   JUICE PLUS FIBRE PO Take 1 tablet  by mouth 2 (two) times daily. Fruit blend and vegetable blend   lidocaine 2 % solution Commonly known as: XYLOCAINE Use as directed 15 mLs in the mouth or throat every 6 (six) hours as needed for mouth pain.   lidocaine 5 % Commonly known as: LIDODERM Place 1 patch onto the skin daily. Remove & Discard  patch within 12 hours or as directed by MD   Melatonin 10 MG Caps Take 10 mg by mouth at bedtime.   metoCLOPramide 10 MG tablet Commonly known as: Reglan Take 1 tablet (10 mg total) by mouth every 6 (six) hours as needed for refractory nausea / vomiting.   metoprolol tartrate 25 MG tablet Commonly known as: LOPRESSOR TAKE 1 TABLET(25 MG) BY MOUTH TWICE DAILY   multivitamin tablet Take 1 tablet by mouth every evening. 50 Plus   nitroGLYCERIN 0.4 MG SL tablet Commonly known as: NITROSTAT Place 1 tablet (0.4 mg total) under the tongue every 5 (five) minutes as needed for chest pain. MAX 3 doses   omega-3 acid ethyl esters 1 g capsule Commonly known as: Lovaza Take 2 capsules (2 g total) by mouth 2 (two) times daily. What changed: how much to take   omeprazole 40 MG capsule Commonly known as: PRILOSEC Take 1 capsule (40 mg total) by mouth in the morning and at bedtime. What changed: when to take this   ondansetron 4 MG tablet Commonly known as: Zofran Take 1 tablet (4 mg total) by mouth every 6 (six) hours as needed for nausea or vomiting.   oxyCODONE 5 MG immediate release tablet Commonly known as: Roxicodone Take 1 tablet (5 mg total) by mouth every 8 (eight) hours as needed.   simethicone 80 MG chewable tablet Commonly known as: Gas-X Chew 1 tablet (80 mg total) by mouth 4 (four) times daily as needed for flatulence.   tadalafil 5 MG tablet Commonly known as: CIALIS Take 1 tablet (5 mg total) by mouth daily.   ticagrelor 60 MG Tabs tablet Commonly known as: Brilinta TAKE 1 TABLET(60 MG) BY MOUTH TWICE DAILY Start taking on: July 26, 2021 What changed: These instructions start on July 26, 2021. If you are unsure what to do until then, ask your doctor or other care provider.   zolpidem 5 MG tablet Commonly known as: AMBIEN TAKE 1 TABLET BY MOUTH AT BEDTIME AS NEEDED FOR SLEEP         Consultations: General surgery  GI  Procedures/Studies:  DG Lumbar  Spine Complete  Result Date: 06/25/2021 CLINICAL DATA:  Bilateral hip pain radiculopathy.  No known injury. EXAM: LUMBAR SPINE - COMPLETE 4+ VIEW COMPARISON:  No prior. FINDINGS: Lumbar spine numbered the lowest segmented appearing lumbar shaped vertebrae on lateral view as L5. Diffuse multilevel degenerative change with multilevel disc degeneration and endplate osteophyte formation most prominent L5-S1. Diffuse facet hypertrophy. No acute bony abnormality. Aortoiliac atherosclerotic vascular calcification. IMPRESSION: 1. Diffuse multilevel degenerative change. Disc degeneration endplate osteophyte formation most prominent L5-S1. No acute bony abnormality. 2.  Aortoiliac atherosclerotic vascular disease. Electronically Signed   By: Marcello Moores  Register M.D.   On: 06/25/2021 10:12     Subjective: - no chest pain, shortness of breath, no abdominal pain, nausea or vomiting.   Discharge Exam: BP 131/84 (BP Location: Right Arm)    Pulse 100    Temp 97.8 F (36.6 C) (Oral)    Resp 17    Ht 5\' 6"  (1.676 m)    Wt 88 kg    SpO2 94%  BMI 31.31 kg/m   General: Pt is alert, awake, not in acute distress Cardiovascular: RRR, S1/S2 +, no rubs, no gallops Respiratory: CTA bilaterally, no wheezing, no rhonchi Abdominal: Soft, NT, ND, bowel sounds + Extremities: no edema, no cyanosis    The results of significant diagnostics from this hospitalization (including imaging, microbiology, ancillary and laboratory) are listed below for reference.     Microbiology: Recent Results (from the past 240 hour(s))  SARS CORONAVIRUS 2 (TAT 6-24 HRS) Nasopharyngeal Nasopharyngeal Swab     Status: None   Collection Time: 07/19/21  9:01 AM   Specimen: Nasopharyngeal Swab  Result Value Ref Range Status   SARS Coronavirus 2 NEGATIVE NEGATIVE Final    Comment: (NOTE) SARS-CoV-2 target nucleic acids are NOT DETECTED.  The SARS-CoV-2 RNA is generally detectable in upper and lower respiratory specimens during the acute phase  of infection. Negative results do not preclude SARS-CoV-2 infection, do not rule out co-infections with other pathogens, and should not be used as the sole basis for treatment or other patient management decisions. Negative results must be combined with clinical observations, patient history, and epidemiological information. The expected result is Negative.  Fact Sheet for Patients: SugarRoll.be  Fact Sheet for Healthcare Providers: https://www.woods-mathews.com/  This test is not yet approved or cleared by the [place] FDA and  has been authorized for detection and/or diagnosis of SARS-CoV-2 by FDA under an Emergency Use Authorization (EUA). This EUA will remain  in effect (meaning this test can be used) for the duration of the COVID-19 declaration under Se ction 564(b)(1) of the Act, 21 U.S.C. section 360bbb-3(b)(1), unless the authorization is terminated or revoked sooner.  Performed at [facility], [place] [address] [place], [place] [address]      Labs: Basic Metabolic Panel: Recent Labs  Lab 07/17/21 1035 07/23/21 1416  NA 134* 135  K 4.1 3.7  CL 104 104  CO2 24 25  GLUCOSE 105* 134*  BUN 18 15  CREATININE 1.04 1.07  CALCIUM 9.3 8.9   Liver Function Tests: No results for input(s): AST, ALT, ALKPHOS, BILITOT, PROT, ALBUMIN in the last 168 hours. CBC: Recent Labs  Lab 07/17/21 1035 07/23/21 1416  WBC 5.0 10.1  HGB 15.0 14.6  HCT 44.2 42.9  MCV 93.4 93.5  PLT 208 199   CBG: No results for input(s): GLUCAP in the last 168 hours. Hgb A1c No results for input(s): HGBA1C in the last 72 hours. Lipid Profile No results for input(s): CHOL, HDL, LDLCALC, TRIG, CHOLHDL, LDLDIRECT in the last 72 hours. Thyroid function studies No results for input(s): TSH, T4TOTAL, T3FREE, THYROIDAB in the last 72 hours.  Invalid input(s): FREET3 Urinalysis    Component Value Date/Time   LABSPEC 1.025 02/19/2017 1009    BILIRUBINUR negative 02/19/2017 1009   BILIRUBINUR n 01/11/2016 1323   KETONESUR negative 02/19/2017 1009   PROTEINUR negative 02/19/2017 1009   PROTEINUR n 01/11/2016 1323   UROBILINOGEN negative 01/11/2016 1323   NITRITE Negative 02/19/2017 1009   NITRITE n 01/11/2016 1323   LEUKOCYTESUR Negative 02/19/2017 1009    FURTHER DISCHARGE INSTRUCTIONS:   Get Medicines reviewed and adjusted: Please take all your medications with you for your next visit with your Primary MD   Laboratory/radiological data: Please request your Primary MD to go over all hospital tests and procedure/radiological results at the follow up, please ask your Primary MD to get all Hospital records sent to his/her office.   In some cases, they will be blood work,  cultures and biopsy results pending at the time of your discharge. Please request that your primary care M.D. goes through all the records of your hospital data and follows up on these results.   Also Note the following: If you experience worsening of your admission symptoms, develop shortness of breath, life threatening emergency, suicidal or homicidal thoughts you must seek medical attention immediately by calling 911 or calling your MD immediately  if symptoms less severe.   You must read complete instructions/literature along with all the possible adverse reactions/side effects for all the Medicines you take and that have been prescribed to you. Take any new Medicines after you have completely understood and accpet all the possible adverse reactions/side effects.    Do not drive when taking Pain medications or sleeping medications (Benzodaizepines)   Do not take more than prescribed Pain, Sleep and Anxiety Medications. It is not advisable to combine anxiety,sleep and pain medications without talking with your primary care practitioner   Special Instructions: If you have smoked or chewed Tobacco  in the last 2 yrs please stop smoking, stop any regular  Alcohol  and or any Recreational drug use.   Wear Seat belts while driving.   Please note: You were cared for by a hospitalist during your hospital stay. Once you are discharged, your primary care physician will handle any further medical issues. Please note that NO REFILLS for any discharge medications will be authorized once you are discharged, as it is imperative that you return to your primary care physician (or establish a relationship with a primary care physician if you do not have one) for your post hospital discharge needs so that they can reassess your need for medications and monitor your lab values.  Time coordinating discharge: 40 minutes  SIGNED:  Marzetta Board, MD, PhD 07/24/2021, 7:41 AM

## 2021-07-24 NOTE — Telephone Encounter (Signed)
-----   Message from [place], DO sent at 07/24/2021  7:37 AM EST ----- Patient did well with TIF yesterday.  Discharging home today.  Can you please call to check in on him tomorrow or Friday to make sure he is doing okay with tolerating liquids, etc.   Thank you.

## 2021-08-01 DIAGNOSIS — D2239 Melanocytic nevi of other parts of face: Secondary | ICD-10-CM | POA: Diagnosis not present

## 2021-08-01 DIAGNOSIS — Z23 Encounter for immunization: Secondary | ICD-10-CM | POA: Diagnosis not present

## 2021-08-01 DIAGNOSIS — D485 Neoplasm of uncertain behavior of skin: Secondary | ICD-10-CM | POA: Diagnosis not present

## 2021-08-06 ENCOUNTER — Telehealth: Payer: Self-pay | Admitting: General Surgery

## 2021-08-06 ENCOUNTER — Ambulatory Visit: Payer: Medicare PPO | Admitting: Gastroenterology

## 2021-08-06 NOTE — Telephone Encounter (Signed)
Very happy to hear that Henry Rogers is doing so well!  Yes, since he is 2 weeks postop, okay to continue to slowly increase his diet per the postoperative diet handout.  Should be able to move onto the next phase (mashed potato type consistency).  Look forward to seeing him in follow-up next week.  Thanks.

## 2021-08-06 NOTE — Telephone Encounter (Signed)
The patient came to the clinic today at 10:00am his appointment was for 9am. He states he is 2 weeks post op from TIF and doing fabulous.No pain, able to swallow, take his medications and feels this sx was a success!!! He would like to increase his diet at this time to the next phase. The patient is scheduled to come to the office on 08/15/2021 but would like a call back today to advise on his food intake.

## 2021-08-07 NOTE — Telephone Encounter (Signed)
Notified the patient of Dr Fayne Norrie plan for him to move onto next phase. The patient also indicated that he would like to cut back his morning PPI to 20mg  instead of 40mg . I expressed at this time it is indicated that he may in the postop instructions. Patient verbalied understanding.

## 2021-08-08 ENCOUNTER — Encounter: Payer: Self-pay | Admitting: Family Medicine

## 2021-08-09 ENCOUNTER — Other Ambulatory Visit: Payer: Self-pay

## 2021-08-09 DIAGNOSIS — M25552 Pain in left hip: Secondary | ICD-10-CM

## 2021-08-09 DIAGNOSIS — M25551 Pain in right hip: Secondary | ICD-10-CM

## 2021-08-15 ENCOUNTER — Ambulatory Visit: Payer: Medicare PPO | Admitting: Gastroenterology

## 2021-08-15 ENCOUNTER — Other Ambulatory Visit: Payer: Self-pay

## 2021-08-15 ENCOUNTER — Encounter: Payer: Self-pay | Admitting: Gastroenterology

## 2021-08-15 VITALS — BP 116/68 | HR 74 | Ht 66.0 in | Wt 181.5 lb

## 2021-08-15 DIAGNOSIS — Z8719 Personal history of other diseases of the digestive system: Secondary | ICD-10-CM

## 2021-08-15 DIAGNOSIS — Z9889 Other specified postprocedural states: Secondary | ICD-10-CM | POA: Diagnosis not present

## 2021-08-15 DIAGNOSIS — K219 Gastro-esophageal reflux disease without esophagitis: Secondary | ICD-10-CM | POA: Diagnosis not present

## 2021-08-15 NOTE — Progress Notes (Signed)
Chief Complaint:    Postoperative follow-up  GI History: Henry Rogers is a 71 year old male with a history of CAD  s/p STEMI 2015 with RCA stent (on Brilinta, ASA), CHF (EF 55-60% in 2016), ischemic cardiomyopathy, OSA, paroxysmal A. fib (no anticoagulation), BPH, initially seen by me in 03/2021 for evaluation of GERD, hiatal hernia, and antireflux surgical options.  Please see note dated 04/09/2021 for full reflux history.  GERD history: -Index symptoms: Waterbrash, heartburn, regurgitation, dyspepsia, nocturnal regurgitation, dry cough. No dysphagia -Exacerbating features: Regurgitation with forward flexion,  -Medications trialed: Zantac, Prilosec, Pepcid -Current medications: Prilosec 40 mg QAM, Pepcid 20 mg qhs. Sleeps with HOB elevated -Complications: Hiatal hernia   GERD evaluation: -Last EGD: 07/2021 -Barium esophagram: 02/2021: Normal motility, small HH.  Severe gastroesophageal reflux to the level of thoracic inlet -Esophageal Manometry: None -pH/Impedance: None -Bravo: None   Endoscopic History: - EGD (06/2018): Irregular but nonnodular Z-line - EGD (02/2021): 2-3 cm HH, Hill grade 3 valve - cTIF (07/23/2021):LA Grade A esophagitis.  30 Serofuse fasteners placed.    -  Colonoscopy Dr. Benson Norway 03/2015 for screening: findings 25mm ascending colon polyp, sigmoid diverticulosis. Polyp was "unremarkable mucosa" on path, not precancerous polyp.  He needs recall colonoscopy 03/2025  HPI:     Patient is a 71 y.o. male presenting to the Gastroenterology Clinic for postoperative follow-up after cTIF completed 07/23/2021.  Was discharged home the following day with plan for Prilosec 40 mg bid x2 weeks then start Prilosec titration along with Pepcid 20 mg qhs x2 weeks then transition to prn.  Today, he states he feels very well.  Tolerating postoperative diet without any issues.  Has started eating soft foods this week without issue.  Has weaned Prilosec down to 20 mg/day and continues to take  Pepcid 20 mg qhs. Sleeping flat.  No breakthrough reflux symptoms, regurgitation.  Overall very happy with results from surgery.  Was seen and followed by Dr. Redmond Pulling yesterday and released to prn f/u only.  Okay to liberalize exercise/activity.  Review of systems:     No chest pain, no SOB, no fevers, no urinary sx   Past Medical History:  Diagnosis Date   BPH (benign prostatic hyperplasia)    CAD (coronary artery disease)    a. inf STEMI s/p BMS to mRCA (02/03/14)  b. inf STEMI s/p PCTA to RCA with slow reflow.   Chronic diastolic CHF (congestive heart failure) ([place])    a. 2D echo on 05/18/15 LVEF 55-60%, akinesis basal inferior wall, G2DD     GERD (gastroesophageal reflux disease)    Hearing loss    Hemorrhoids    HH (hiatus hernia)    Ischemic cardiomyopathy    a. ECHO 01/2014 EF 45-50% and akinesis of the entire inferior myocardium. Mild MR.   b. now resolved (EF 55-60% on [id number])     Metabolic syndrome    Myocardial infarction ([place]) 2015   Heart attack in 2015 and 2017   OSA (obstructive sleep apnea)    a. mild not requiring CPAP at this time   PAF (paroxysmal atrial fibrillation) ([place])    a. brief episode after cardiac cath on 05/16/15- resolved after IV amiodarone.   Tinnitus    Varicose veins     Patient's surgical history, family medical history, social history, medications and allergies were all reviewed in Epic    Current Outpatient Medications  Medication Sig Dispense Refill   Acetaminophen 500 MG capsule Take 1,000 mg by mouth 3 (three) times daily  as needed for pain.     aspirin 81 MG tablet Take 81 mg by mouth daily.     atorvastatin (LIPITOR) 80 MG tablet TAKE 1 TABLET(80 MG) BY MOUTH DAILY AT 6 PM 90 tablet 3   famotidine (PEPCID) 20 MG tablet Take 1 tablet (20 mg total) by mouth at bedtime.     lidocaine (LIDODERM) 5 % Place 1 patch onto the skin daily. Remove & Discard patch within 12 hours or as directed by MD 14 patch 0   lidocaine (XYLOCAINE) 2 % solution  Use as directed 15 mLs in the mouth or throat every 6 (six) hours as needed for mouth pain. 100 mL 0   Melatonin 10 MG CAPS Take 10 mg by mouth at bedtime.     metoCLOPramide (REGLAN) 10 MG tablet Take 1 tablet (10 mg total) by mouth every 6 (six) hours as needed for refractory nausea / vomiting. 20 tablet 0   metoprolol tartrate (LOPRESSOR) 25 MG tablet TAKE 1 TABLET(25 MG) BY MOUTH TWICE DAILY 180 tablet 1   Multiple Vitamin (MULTIVITAMIN) tablet Take 1 tablet by mouth every evening. 50 Plus     nitroGLYCERIN (NITROSTAT) 0.4 MG SL tablet Place 1 tablet (0.4 mg total) under the tongue every 5 (five) minutes as needed for chest pain. MAX 3 doses 25 tablet 7   Nutritional Supplements (JUICE PLUS FIBRE PO) Take 1 tablet by mouth 2 (two) times daily. Fruit blend and vegetable blend     omega-3 acid ethyl esters (LOVAZA) 1 g capsule Take 2 capsules (2 g total) by mouth 2 (two) times daily. (Patient taking differently: Take 1 g by mouth 2 (two) times daily.) 360 capsule 3   omeprazole (PRILOSEC) 40 MG capsule Take 1 capsule (40 mg total) by mouth in the morning and at bedtime. 60 capsule 0   ondansetron (ZOFRAN) 4 MG tablet Take 1 tablet (4 mg total) by mouth every 6 (six) hours as needed for nausea or vomiting. 20 tablet 0   oxyCODONE (ROXICODONE) 5 MG immediate release tablet Take 1 tablet (5 mg total) by mouth every 8 (eight) hours as needed. 20 tablet 0   simethicone (GAS-X) 80 MG chewable tablet Chew 1 tablet (80 mg total) by mouth 4 (four) times daily as needed for flatulence. 30 tablet 0   tadalafil (CIALIS) 5 MG tablet Take 1 tablet (5 mg total) by mouth daily. 90 tablet 3   ticagrelor (BRILINTA) 60 MG TABS tablet TAKE 1 TABLET(60 MG) BY MOUTH TWICE DAILY 180 tablet 2   zolpidem (AMBIEN) 5 MG tablet TAKE 1 TABLET BY MOUTH AT BEDTIME AS NEEDED FOR SLEEP 30 tablet 0   No current facility-administered medications for this visit.    Physical Exam:     There were no vitals taken for this  visit.  GENERAL:  Pleasant male in NAD PSYCH: : Cooperative, normal affect Musculoskeletal:  Normal muscle tone, normal strength NEURO: Alert and oriented x 3, no focal neurologic deficits   IMPRESSION and PLAN:    1) GERD now s/p cTIF 2) History of hiatal hernia s/p laparoscopic HHR -Doing very well postoperatively.  Tolerating postoperative diet without issue - Has been able to titrate Prilosec down without breakthrough reflux symptoms - Continue Prilosec 20 mg/day this week, then can discontinue next week - Continue Pepcid 20 mg qhs for another 2 weeks, then qod, and if no breakthrough symptoms, prn only - Continue slowly advancing diet per postoperative protocol - Continue advancing activity/exercise per postoperative protocol -  RTC 3-4 months or sooner as needed          Lavena Bullion ,DO, FACG 08/15/2021, 10:41 AM

## 2021-08-15 NOTE — Patient Instructions (Addendum)
If you are age 73 or older, your body mass index should be between 23-30. Your There is no height or weight on file to calculate BMI. If this is out of the aforementioned range listed, please consider follow up with your Primary Care Provider.  __________________________________________________________  The [facility] providers would like to encourage you to use [facility] to communicate with providers for non-urgent requests or questions.  Due to long hold times on the telephone, sending your provider a message by [facility] may be a faster and more efficient way to get a response.  Please allow 48 business hours for a response.  Please remember that this is for non-urgent requests.    Due to recent changes in healthcare laws, you may see the results of your imaging and laboratory studies on MyChart before your provider has had a chance to review them.  We understand that in some cases there may be results that are confusing or concerning to you. Not all laboratory results come back in the same time frame and the provider may be waiting for multiple results in order to interpret others.  Please give [place] 48 hours in order for your provider to thoroughly review all the results before contacting the office for clarification of your results.    Follow up in 3 to 4 months. Give [place] a call at [phone] to schedule an appointment.   Thank you for choosing me and [facility].  Vito Cirigliano, D.O.

## 2021-08-16 ENCOUNTER — Other Ambulatory Visit (INDEPENDENT_AMBULATORY_CARE_PROVIDER_SITE_OTHER): Payer: Medicare PPO

## 2021-08-16 ENCOUNTER — Other Ambulatory Visit: Payer: Self-pay

## 2021-08-16 ENCOUNTER — Telehealth: Payer: Medicare PPO | Admitting: Family Medicine

## 2021-08-16 ENCOUNTER — Encounter: Payer: Self-pay | Admitting: Family Medicine

## 2021-08-16 VITALS — Temp 99.4°F | Wt 181.0 lb

## 2021-08-16 DIAGNOSIS — J069 Acute upper respiratory infection, unspecified: Secondary | ICD-10-CM

## 2021-08-16 DIAGNOSIS — R059 Cough, unspecified: Secondary | ICD-10-CM | POA: Diagnosis not present

## 2021-08-16 LAB — POCT RAPID STREP A (OFFICE): Rapid Strep A Screen: NEGATIVE

## 2021-08-16 LAB — POC COVID19 BINAXNOW: SARS Coronavirus 2 Ag: NEGATIVE

## 2021-08-16 LAB — POCT INFLUENZA A/B
Influenza A, POC: NEGATIVE
Influenza B, POC: NEGATIVE

## 2021-08-16 NOTE — Progress Notes (Signed)
° °  Subjective:    Patient ID: Henry Rogers, male    DOB: 1951-07-05, 71 y.o.   MRN: [id number]  HPI He states that 2 days ago he developed a slight cough with nasal congestion and sore throat.  Yesterday the symptoms continued and he had a fever of 102.1.  He has been using NyQuil to help with the cough and with sleep.   Review of Systems     Objective:   Physical Exam Alert and in no distress.  Nasal congestion is noted.  COVID test was negative as was influenza testing.     Assessment & Plan:  Viral URI with cough Recommend he continue with NyQuil at night and DayQuil during the day as well as Tylenol for the fever aches and pains.  He will call if he has further difficulty.

## 2021-08-19 ENCOUNTER — Telehealth: Payer: Medicare PPO | Admitting: Family Medicine

## 2021-08-23 ENCOUNTER — Telehealth: Payer: Self-pay

## 2021-08-23 NOTE — Telephone Encounter (Signed)
P.A. TADALAFIL 

## 2021-08-29 NOTE — Telephone Encounter (Signed)
P.A. approved til 07/20/22, pt informed

## 2021-09-05 DIAGNOSIS — M9903 Segmental and somatic dysfunction of lumbar region: Secondary | ICD-10-CM | POA: Diagnosis not present

## 2021-09-05 DIAGNOSIS — M9902 Segmental and somatic dysfunction of thoracic region: Secondary | ICD-10-CM | POA: Diagnosis not present

## 2021-09-05 DIAGNOSIS — M9905 Segmental and somatic dysfunction of pelvic region: Secondary | ICD-10-CM | POA: Diagnosis not present

## 2021-09-05 DIAGNOSIS — M5441 Lumbago with sciatica, right side: Secondary | ICD-10-CM | POA: Diagnosis not present

## 2021-09-12 DIAGNOSIS — M9905 Segmental and somatic dysfunction of pelvic region: Secondary | ICD-10-CM | POA: Diagnosis not present

## 2021-09-12 DIAGNOSIS — M9902 Segmental and somatic dysfunction of thoracic region: Secondary | ICD-10-CM | POA: Diagnosis not present

## 2021-09-12 DIAGNOSIS — M5441 Lumbago with sciatica, right side: Secondary | ICD-10-CM | POA: Diagnosis not present

## 2021-09-12 DIAGNOSIS — M9903 Segmental and somatic dysfunction of lumbar region: Secondary | ICD-10-CM | POA: Diagnosis not present

## 2021-09-16 ENCOUNTER — Other Ambulatory Visit: Payer: Medicare PPO

## 2021-09-16 ENCOUNTER — Other Ambulatory Visit: Payer: Self-pay

## 2021-09-17 ENCOUNTER — Ambulatory Visit: Payer: Medicare PPO | Attending: Family Medicine

## 2021-09-17 DIAGNOSIS — M25551 Pain in right hip: Secondary | ICD-10-CM | POA: Insufficient documentation

## 2021-09-17 DIAGNOSIS — M25552 Pain in left hip: Secondary | ICD-10-CM | POA: Insufficient documentation

## 2021-09-17 NOTE — Therapy (Signed)
OUTPATIENT PHYSICAL THERAPY LOWER EXTREMITY EVALUATION   Patient Name: Henry Rogers MRN: [id number] DOB:Mar 12, 1951, 71 y.o., male Today's Date: 09/17/2021   PT End of Session - 09/17/21 1041     Visit Number 1    Number of Visits 9    Date for PT Re-Evaluation 11/12/21    Authorization Type [employer] MCR    PT Start Time 1045    PT Stop Time 1133    PT Time Calculation (min) 48 min    Activity Tolerance Patient tolerated treatment well    Behavior During Therapy WFL for tasks assessed/performed             Past Medical History:  Diagnosis Date   BPH (benign prostatic hyperplasia)    CAD (coronary artery disease)    a. inf STEMI s/p BMS to mRCA (02/03/14)  b. inf STEMI s/p PCTA to RCA with slow reflow.   Chronic diastolic CHF (congestive heart failure) ([place])    a. 2D echo on 05/18/15 LVEF 55-60%, akinesis basal inferior wall, G2DD     GERD (gastroesophageal reflux disease)    Hearing loss    Hemorrhoids    HH (hiatus hernia)    Ischemic cardiomyopathy    a. ECHO 01/2014 EF 45-50% and akinesis of the entire inferior myocardium. Mild MR.   b. now resolved (EF 55-60% on 123456)     Metabolic syndrome    Myocardial infarction ([place]) 2015   Heart attack in 2015 and 2017   OSA (obstructive sleep apnea)    a. mild not requiring CPAP at this time   PAF (paroxysmal atrial fibrillation) ([place])    a. brief episode after cardiac cath on 05/16/15- resolved after IV amiodarone.   Tinnitus    Varicose veins    Past Surgical History:  Procedure Laterality Date   CARDIAC CATHETERIZATION N/A 05/16/2015   Procedure: Left Heart Cath and Coronary Angiography;  Surgeon: Belva Crome, MD;  Location: [facility];  Service: Cardiovascular;  Laterality: N/A;   CARDIAC CATHETERIZATION N/A 05/16/2015   Procedure: Coronary Stent Intervention;  Surgeon: Belva Crome, MD;  Location: [facility];  Service: Cardiovascular;  Laterality: N/A;   CARPAL TUNNEL RELEASE Bilateral  03/02/2015   Procedure: BILATERAL CARPAL TUNNEL RELEASE ;  Surgeon: Daryll Brod, MD;  Location: [facility];  Service: Orthopedics;  Laterality: Bilateral;   COLONOSCOPY  07/21/2004   Dr. Benson Norway   CORONARY ANGIOPLASTY WITH STENT PLACEMENT  02/02/2014   BMS to RCA   ENDOVENOUS ABLATION SAPHENOUS VEIN W/ LASER Right 10/12/2014   EVLA right greater saphenous vein by Curt Jews MD   ESOPHAGEAL DILATION  07/23/2021   Procedure: ESOPHAGEAL DILATION;  Surgeon: Lavena Bullion, DO;  Location: [facility];  Service: Endoscopy;;   ESOPHAGOGASTRODUODENOSCOPY N/A 07/23/2021   Procedure: ESOPHAGOGASTRODUODENOSCOPY (EGD);  Surgeon: Lavena Bullion, DO;  Location: [facility];  Service: Endoscopy;  Laterality: N/A;   HIATAL HERNIA REPAIR N/A 07/23/2021   Procedure: LAPAROSCOPIC REPAIR OF HIATAL HERNIA, LAPOROSCOPIC  TRANSVERSE ABDOMINIS PLANE (TAP) BLOCK ;  Surgeon: Greer Pickerel, MD;  Location: [facility];  Service: General;  Laterality: N/A;   LEFT HEART CATHETERIZATION WITH CORONARY ANGIOGRAM N/A 02/02/2014   Procedure: LEFT HEART CATHETERIZATION WITH CORONARY ANGIOGRAM;  Surgeon: Peter M Martinique, MD;  Location: [facility];  Service: Cardiovascular;  Laterality: N/A;   SKIN BIOPSY Left 06/19/2021   seborrheic keratosis   stab phlebectomy  Right 03/15/2015   stab phlebectomy > 20 incisions (right  leg) by Curt Jews MD   TRANSORAL INCISIONLESS FUNDOPLICATION N/A [id number]   Procedure: TRANSORAL INCISIONLESS FUNDOPLICATION;  Surgeon: Lavena Bullion, DO;  Location: [place];  Service: Endoscopy;  Laterality: N/A;   Patient Active Problem List   Diagnosis Date Noted   History of fundoplication    Hiatal hernia with GERD and esophagitis 07/23/2021   Gastroesophageal reflux disease with esophagitis without hemorrhage    Aortic atherosclerosis ([place]) 06/25/2021   Hiatal hernia 11/29/2020   Old inferior wall myocardial infarction 10/02/2016   Former smoker 01/11/2016   Chronic diastolic CHF (congestive heart  failure) ([facility])    PAF (paroxysmal atrial fibrillation) ([facility])    CAD (coronary artery disease)    OSA (obstructive sleep apnea)    Varicose veins of leg with complications [id number]   Hyperlipidemia LDL goal <70 123456   Metabolic syndrome 123456   Presbycusis of both ears    BPH (benign prostatic hyperplasia) 02/22/2013   Actinic keratosis 02/22/2013   ED (erectile dysfunction) 02/20/2012   Gastroesophageal reflux disease without esophagitis 02/20/2012    PCP: Denita Lung, MD  REFERRING PROVIDER: Denita Lung, MD  REFERRING DIAG:  [phone] (ICD-10-CM) - Bilateral hip pain  THERAPY DIAG:  Pain in right hip  Pain in left hip  ONSET DATE: October 2022  SUBJECTIVE:   SUBJECTIVE STATEMENT: Pt presents to PT with reports of bilateral hip pain, R>L, with referral into lateral thighs. No MOI, gradual onset and describes pain as dull, achy, and tight. Notes that he has been seeing a chiropractor with some relief in symptoms. Denies paresthesias down either extremity. Pt also notes that pain has been less since he can sleep in more comfortable positions post hiatal hernia repair surgery.   PERTINENT HISTORY: Previous MI, CHF  PAIN:  Are you having pain? No NPRS scale: 0/10 Pain location: Bilateral hips; lateral thigh PAIN TYPE: dull and radiating Pain description: intermittent  Aggravating factors: yard work, physical activity Relieving factors: rest  PRECAUTIONS: None  WEIGHT BEARING RESTRICTIONS No  FALLS:  Has patient fallen in last 6 months? No, Number of falls: N/A  LIVING ENVIRONMENT: Lives with: lives with their family Lives in: House/apartment Stairs: Yes; Internal: 12 steps; on right going up and External: 4 steps; can reach both Has following equipment at home: None  OCCUPATION: None  PLOF: Independent and Independent with basic ADLs  PATIENT GOALS: decrease pain in hips to improve tolerance with gardening and other outdoor  activity   OBJECTIVE:   DIAGNOSTIC FINDINGS:  CLINICAL DATA:  Bilateral hip pain   EXAM: DG HIP (WITH OR WITHOUT PELVIS) 1V RIGHT   COMPARISON:  None.   FINDINGS: Single view examination of the right hip demonstrates normal alignment. No acute fracture or dislocation. Mild right hip degenerative arthritis with asymmetric joint space narrowing noted. Soft tissues are unremarkable.   IMPRESSION: Mild right hip degenerative arthritis    PATIENT SURVEYS:  FOTO 69% function; 74% predicted  COGNITION:  Overall cognitive status: Within functional limits for tasks assessed     POSTURE:  Small body habitus, slumped sitting posture, fwd head, anteriorly rotated pelvis  PALPATION: TTP to bilateral glute medius, greater trochanteric musculature  LE AROM/PROM:  AROM Right 09/17/2021 Left 09/17/2021  Hip flexion     Hip extension    Hip abduction    Hip adduction    Hip internal rotation 9 12  Hip external rotation 35 33  Knee flexion    Knee extension    Ankle dorsiflexion  Ankle plantarflexion    Ankle inversion    Ankle eversion    (Blank rows = not tested)  LE MMT:  MMT Right 09/17/2021 Left 09/17/2021  Hip flexion  5/5 5/5  Hip extension    Hip abduction 5/5 5/5  Hip adduction    Hip external rotation    Hip internal rotation    Knee extension 5/5 5/5  Knee flexion 5/5 5/5  Ankle dorsiflexion     Ankle plantarflexion    Ankle inversion    Ankle eversion    Grossly    (Blank rows = not tested)   LOWER EXTREMITY SPECIAL TESTS:  Hip special tests: Saralyn Pilar (FABER) test: negative, Thomas test: positive , and Hip scouring test: negative  FUNCTIONAL TESTS:  30 Second Sit to Stand: 17 reps  GAIT: Distance walked: 18ft Assistive device utilized: None Level of assistance: Complete Independence Comments: no overt gait deviation  TODAY'S TREATMENT: [facility] Adult PT Treatment:                                                DATE: 09/17/2021 Therapeutic  Exercise: Bridge x 10 Modified thomas stretch x 30" R Supine piriformis stretch x 30" R Supine figure 4 stretch x 30" each Self Care: Education on tennis ball massage and trigger point release to bilateral hip at home  PATIENT EDUCATION:  Education details: eval findings, FOTO, HEP, POC Person educated: Patient Education method: Explanation, Demonstration, and Handouts Education comprehension: verbalized understanding and returned demonstration   HOME EXERCISE PROGRAM: Access Code: [email or web address] URL: [email or web address] Date: 09/17/2021 Prepared by: Octavio Manns  Exercises Modified Marcello Moores Stretch - 1 x daily - 7 x weekly - 2 reps - 60 sec hold Supine Bridge - 1 x daily - 7 x weekly - 2-3 sets - 10 reps - 3 sec hold Supine Piriformis Stretch with Leg Straight - 1 x daily - 7 x weekly - 2 reps - 30 sec hold Supine Figure 4 Piriformis Stretch - 1 x daily - 7 x weekly - 2 reps - 30 sec hold Supine Figure 4 Piriformis Stretch - 1 x daily - 7 x weekly - 2 reps - 30 sec hold Seated Piriformis Stretch - 1 x daily - 7 x weekly - 2 reps - 30 sec hold  ASSESSMENT:  CLINICAL IMPRESSION: Patient is a 71 y.o. M who was seen today for physical therapy evaluation and treatment for bilateral hip pain and tightness, R>L. Physical findings are consistent with physician impression, as pt demonstrates decrease in bilateral hip IR, therefore unable to rule out possible contribution of osteoarthritis. His FOTO score shows he is operating below PLOF and would benefit from skilled PT services working on improving hip strength and mobility. Will assess response to initial HEP and progress as able.     OBJECTIVE IMPAIRMENTS decreased ROM, decreased strength, and pain.   ACTIVITY LIMITATIONS community activity and yard work.   PERSONAL FACTORS 1-2 comorbidities: Previous MI, CHF  are also affecting patient's functional outcome.    REHAB POTENTIAL: Excellent  CLINICAL DECISION MAKING:  Stable/uncomplicated  EVALUATION COMPLEXITY: Low   GOALS: Goals reviewed with patient? No  SHORT TERM GOALS:  STG Name Target Date Goal status  1 Pt will be compliant and knowledgeable with initial HEP for improved comfort and carryover Baseline: initial HEP given 10/01/2021 INITIAL   LONG TERM GOALS:  LTG Name Target Date Goal status  1 Pt will improve FOTO function score to no less than 74% as proxy for functional improvement Baseline: 69% function 11/12/2021 INITIAL  2 Pt will self report bilateral hip pain no greater than 1/10 for improved comfort and functional ability Baseline: 4/10 at worst 11/12/2021 INITIAL  3 Pt will improve bilateral hip IR to no less than 15 degrees for improved comfort and functional mobility Baseline: see chart 11/12/2021 INITIAL  4 Pt will be able to perform full day of yard work without increase in bilateral hip pain and discomfort Baseline: unable 11/12/2021 INITIAL   PLAN: PT FREQUENCY: 1x/week  PT DURATION: 8 weeks  PLANNED INTERVENTIONS: Therapeutic exercises, Therapeutic activity, Neuro Muscular re-education, Balance training, Gait training, Patient/Family education, Joint mobilization, Dry Needling, Electrical stimulation, Cryotherapy, Moist heat, and Manual therapy  PLAN FOR NEXT SESSION: assess HEP response, progress proximal hip strenghening, TPDN to gluteus medius/piriformis  Referring diagnosis? M25.551,M25.552 (ICD-10-CM) - Bilateral hip pain Treatment diagnosis? (if different than referring diagnosis) M25.551 - Pain in right hip What was this (referring dx) caused by? []  Surgery []  Fall []  Ongoing issue [x]  Arthritis []  Other: ____________  Laterality: []  Rt []  Lt [x]  Both  Check all possible CPT codes:  *CHOOSE 10 OR LESS*    [x]  97110 (Therapeutic Exercise)  []  92507 (SLP Treatment)  [x]  97112 (Neuro Re-ed)   []  92526 (Swallowing Treatment)   [x]  97116 (Gait Training)   []  [id number] (Cognitive Training, 1st 15 minutes) [x]   97140 (Manual Therapy)   []  97130 (Cognitive Training, each add'l 15 minutes)  [x]  97530 (Therapeutic Activities)  []  Other, List CPT Code ____________    [x]  [id number] (Self Care)       []  All codes above (97110 - 97535)  []  97012 (Mechanical Traction)  []  97014 (E-stim Unattended)  []  97032 (E-stim manual)  []  97033 (Ionto)  []  97035 (Ultrasound)  []  97760 (Orthotic Fit) []  [id number] (Physical Performance Training) []  [id number] (Aquatic Therapy) []  97034 (Contrast Bath) []  [id number] (Paraffin) []  97597 (Wound Care 1st 20 sq cm) []  97598 (Wound Care each add'l 20 sq cm) []  97016 (Vasopneumatic Device) []  [id number] (Orthotic Training) []  [id number] (Prosthetic Training)    Ward Chatters, PT 09/17/2021, 11:54 AM

## 2021-09-19 ENCOUNTER — Other Ambulatory Visit (INDEPENDENT_AMBULATORY_CARE_PROVIDER_SITE_OTHER): Payer: Medicare PPO

## 2021-09-19 ENCOUNTER — Other Ambulatory Visit: Payer: Self-pay

## 2021-09-19 DIAGNOSIS — Z23 Encounter for immunization: Secondary | ICD-10-CM | POA: Diagnosis not present

## 2021-09-23 DIAGNOSIS — M9902 Segmental and somatic dysfunction of thoracic region: Secondary | ICD-10-CM | POA: Diagnosis not present

## 2021-09-23 DIAGNOSIS — M9903 Segmental and somatic dysfunction of lumbar region: Secondary | ICD-10-CM | POA: Diagnosis not present

## 2021-09-23 DIAGNOSIS — M5441 Lumbago with sciatica, right side: Secondary | ICD-10-CM | POA: Diagnosis not present

## 2021-09-23 DIAGNOSIS — M9905 Segmental and somatic dysfunction of pelvic region: Secondary | ICD-10-CM | POA: Diagnosis not present

## 2021-09-27 ENCOUNTER — Ambulatory Visit (INDEPENDENT_AMBULATORY_CARE_PROVIDER_SITE_OTHER): Payer: Medicare PPO

## 2021-09-27 VITALS — Ht 66.0 in | Wt 181.0 lb

## 2021-09-27 DIAGNOSIS — Z0000 Encounter for general adult medical examination without abnormal findings: Secondary | ICD-10-CM

## 2021-09-27 NOTE — Patient Instructions (Signed)
Henry Rogers , Thank you for taking time to come for your Medicare Wellness Visit. I appreciate your ongoing commitment to your health goals. Please review the following plan we discussed and let me know if I can assist you in the future.   Screening recommendations/referrals: Colonoscopy: completed 03/29/2015, due 03/28/2025 Recommended yearly ophthalmology/optometry visit for glaucoma screening and checkup Recommended yearly dental visit for hygiene and checkup  Vaccinations: Influenza vaccine: completed 04/18/2021, due next flu season Pneumococcal vaccine: completed 01/03/2015 Tdap vaccine: completed 07/10/2018, due 07/10/2028 Shingles vaccine: completed   Covid-19:  04/18/2021, 11/20/2020, 04/07/2020, 09/21/2019, 08/27/2019  Advanced directives: copy in chart  Conditions/risks identified: none  Next appointment: Follow up in one year for your annual wellness visit.   Preventive Care 38 Years and Older, Male Preventive care refers to lifestyle choices and visits with your health care provider that can promote health and wellness. What does preventive care include? A yearly physical exam. This is also called an annual well check. Dental exams once or twice a year. Routine eye exams. Ask your health care provider how often you should have your eyes checked. Personal lifestyle choices, including: Daily care of your teeth and gums. Regular physical activity. Eating a healthy diet. Avoiding tobacco and drug use. Limiting alcohol use. Practicing safe sex. Taking low doses of aspirin every day. Taking vitamin and mineral supplements as recommended by your health care provider. What happens during an annual well check? The services and screenings done by your health care provider during your annual well check will depend on your age, overall health, lifestyle risk factors, and family history of disease. Counseling  Your health care provider may ask you questions about your: Alcohol use. Tobacco  use. Drug use. Emotional well-being. Home and relationship well-being. Sexual activity. Eating habits. History of falls. Memory and ability to understand (cognition). Work and work Astronomer. Screening  You may have the following tests or measurements: Height, weight, and BMI. Blood pressure. Lipid and cholesterol levels. These may be checked every 5 years, or more frequently if you are over 36 years old. Skin check. Lung cancer screening. You may have this screening every year starting at age 86 if you have a 30-pack-year history of smoking and currently smoke or have quit within the past 15 years. Fecal occult blood test (FOBT) of the stool. You may have this test every year starting at age 38. Flexible sigmoidoscopy or colonoscopy. You may have a sigmoidoscopy every 5 years or a colonoscopy every 10 years starting at age 24. Prostate cancer screening. Recommendations will vary depending on your family history and other risks. Hepatitis C blood test. Hepatitis B blood test. Sexually transmitted disease (STD) testing. Diabetes screening. This is done by checking your blood sugar (glucose) after you have not eaten for a while (fasting). You may have this done every 1-3 years. Abdominal aortic aneurysm (AAA) screening. You may need this if you are a current or former smoker. Osteoporosis. You may be screened starting at age 23 if you are at high risk. Talk with your health care provider about your test results, treatment options, and if necessary, the need for more tests. Vaccines  Your health care provider may recommend certain vaccines, such as: Influenza vaccine. This is recommended every year. Tetanus, diphtheria, and acellular pertussis (Tdap, Td) vaccine. You may need a Td booster every 10 years. Zoster vaccine. You may need this after age 14. Pneumococcal 13-valent conjugate (PCV13) vaccine. One dose is recommended after age 32. Pneumococcal polysaccharide (PPSV23) vaccine.  One dose is recommended after age 2. Talk to your health care provider about which screenings and vaccines you need and how often you need them. This information is not intended to replace advice given to you by your health care provider. Make sure you discuss any questions you have with your health care provider. Document Released: 08/03/2015 Document Revised: 03/26/2016 Document Reviewed: 05/08/2015 Elsevier Interactive Patient Education  2017 [employer] Prevention in the Home Falls can cause injuries. They can happen to people of all ages. There are many things you can do to make your home safe and to help prevent falls. What can I do on the outside of my home? Regularly fix the edges of walkways and driveways and fix any cracks. Remove anything that might make you trip as you walk through a door, such as a raised step or threshold. Trim any bushes or trees on the path to your home. Use bright outdoor lighting. Clear any walking paths of anything that might make someone trip, such as rocks or tools. Regularly check to see if handrails are loose or broken. Make sure that both sides of any steps have handrails. Any raised decks and porches should have guardrails on the edges. Have any leaves, snow, or ice cleared regularly. Use sand or salt on walking paths during winter. Clean up any spills in your garage right away. This includes oil or grease spills. What can I do in the bathroom? Use night lights. Install grab bars by the toilet and in the tub and shower. Do not use towel bars as grab bars. Use non-skid mats or decals in the tub or shower. If you need to sit down in the shower, use a plastic, non-slip stool. Keep the floor dry. Clean up any water that spills on the floor as soon as it happens. Remove soap buildup in the tub or shower regularly. Attach bath mats securely with double-sided non-slip rug tape. Do not have throw rugs and other things on the floor that can make  you trip. What can I do in the bedroom? Use night lights. Make sure that you have a light by your bed that is easy to reach. Do not use any sheets or blankets that are too big for your bed. They should not hang down onto the floor. Have a firm chair that has side arms. You can use this for support while you get dressed. Do not have throw rugs and other things on the floor that can make you trip. What can I do in the kitchen? Clean up any spills right away. Avoid walking on wet floors. Keep items that you use a lot in easy-to-reach places. If you need to reach something above you, use a strong step stool that has a grab bar. Keep electrical cords out of the way. Do not use floor polish or wax that makes floors slippery. If you must use wax, use non-skid floor wax. Do not have throw rugs and other things on the floor that can make you trip. What can I do with my stairs? Do not leave any items on the stairs. Make sure that there are handrails on both sides of the stairs and use them. Fix handrails that are broken or loose. Make sure that handrails are as long as the stairways. Check any carpeting to make sure that it is firmly attached to the stairs. Fix any carpet that is loose or worn. Avoid having throw rugs at the top or bottom of the  stairs. If you do have throw rugs, attach them to the floor with carpet tape. Make sure that you have a light switch at the top of the stairs and the bottom of the stairs. If you do not have them, ask someone to add them for you. What else can I do to help prevent falls? Wear shoes that: Do not have high heels. Have rubber bottoms. Are comfortable and fit you well. Are closed at the toe. Do not wear sandals. If you use a stepladder: Make sure that it is fully opened. Do not climb a closed stepladder. Make sure that both sides of the stepladder are locked into place. Ask someone to hold it for you, if possible. Clearly mark and make sure that you can  see: Any grab bars or handrails. First and last steps. Where the edge of each step is. Use tools that help you move around (mobility aids) if they are needed. These include: Canes. Walkers. Scooters. Crutches. Turn on the lights when you go into a dark area. Replace any light bulbs as soon as they burn out. Set up your furniture so you have a clear path. Avoid moving your furniture around. If any of your floors are uneven, fix them. If there are any pets around you, be aware of where they are. Review your medicines with your doctor. Some medicines can make you feel dizzy. This can increase your chance of falling. Ask your doctor what other things that you can do to help prevent falls. This information is not intended to replace advice given to you by your health care provider. Make sure you discuss any questions you have with your health care provider. Document Released: 05/03/2009 Document Revised: 12/13/2015 Document Reviewed: 08/11/2014 Elsevier Interactive Patient Education  2017 [employer].

## 2021-09-27 NOTE — Progress Notes (Signed)
I connected with  Henry Rogers today via telehealth video enabled device and verified that I am speaking with the correct person using two identifiers.   Location: Patient: home Provider: work  Persons participating in virtual visit: Trintin Orleans, Glenna Durand LPN  I discussed the limitations, risks, security and privacy concerns of performing an evaluation and management service by video and the availability of in person appointments. The patient expressed understanding and agreed to proceed.   Some vital signs may be absent or patient reported.     Subjective:   Henry Rogers is a 71 y.o. male who presents for Medicare Annual/Subsequent preventive examination.  Review of Systems     Cardiac Risk Factors include: advanced age (>47men, >46 women);dyslipidemia;male gender     Objective:    Today's Vitals   09/27/21 0944 09/27/21 0945  Weight: 181 lb (82.1 kg)   Height: 5\' 6"  (1.676 m)   PainSc:  4    Body mass index is 29.21 kg/m.  Advanced Directives 09/27/2021 09/17/2021 07/17/2021 03/14/2021 03/13/2020 02/25/2018 02/19/2017  Does Patient Have a Medical Advance Directive? Yes Yes Yes Yes No;Yes Yes No  Type of Advance Directive Out of facility DNR (pink MOST or yellow form) - Press photographer;Living will Living will;Healthcare Power of Attorney Living will [place];Living will -  Does patient want to make changes to medical advance directive? - - No - Patient declined No - Patient declined No - Patient declined No - Patient declined -  Copy of [place] in Chart? - - No - copy requested Yes - validated most recent copy scanned in chart (See row information) - No - copy requested -  Would patient like information on creating a medical advance directive? - - - - No - Patient declined - Yes (MAU/Ambulatory/Procedural Areas - Information given)  Pre-existing out of facility DNR order (yellow form or pink MOST form) - - - - - - -     Current Medications (verified) Outpatient Encounter Medications as of 09/27/2021  Medication Sig   aspirin 81 MG tablet Take 81 mg by mouth daily.   atorvastatin (LIPITOR) 80 MG tablet TAKE 1 TABLET(80 MG) BY MOUTH DAILY AT 6 PM   Melatonin 10 MG CAPS Take 10 mg by mouth at bedtime.   metoprolol tartrate (LOPRESSOR) 25 MG tablet TAKE 1 TABLET(25 MG) BY MOUTH TWICE DAILY   Multiple Vitamin (MULTIVITAMIN) tablet Take 1 tablet by mouth every evening. 50 Plus   Nutritional Supplements (JUICE PLUS FIBRE PO) Take 1 tablet by mouth 2 (two) times daily. Fruit blend and vegetable blend   omega-3 acid ethyl esters (LOVAZA) 1 g capsule Take 2 capsules (2 g total) by mouth 2 (two) times daily. (Patient taking differently: Take 1 g by mouth 2 (two) times daily.)   tadalafil (CIALIS) 5 MG tablet Take 1 tablet (5 mg total) by mouth daily.   ticagrelor (BRILINTA) 60 MG TABS tablet TAKE 1 TABLET(60 MG) BY MOUTH TWICE DAILY   zolpidem (AMBIEN) 5 MG tablet TAKE 1 TABLET BY MOUTH AT BEDTIME AS NEEDED FOR SLEEP   famotidine (PEPCID) 20 MG tablet Take 1 tablet (20 mg total) by mouth at bedtime.   lidocaine (LIDODERM) 5 % Place 1 patch onto the skin daily. Remove & Discard patch within 12 hours or as directed by MD (Patient not taking: Reported on 08/16/2021)   nitroGLYCERIN (NITROSTAT) 0.4 MG SL tablet Place 1 tablet (0.4 mg total) under the tongue every  5 (five) minutes as needed for chest pain. MAX 3 doses (Patient not taking: Reported on 08/15/2021)   omeprazole (PRILOSEC) 20 MG capsule Take 20 mg by mouth daily.   No facility-administered encounter medications on file as of 09/27/2021.    Allergies (verified) Ace inhibitors   History: Past Medical History:  Diagnosis Date   BPH (benign prostatic hyperplasia)    CAD (coronary artery disease)    a. inf STEMI s/p BMS to mRCA (02/03/14)  b. inf STEMI s/p PCTA to RCA with slow reflow.   Chronic diastolic CHF (congestive heart failure) ([place])    a. 2D echo on  05/18/15 LVEF 55-60%, akinesis basal inferior wall, G2DD     GERD (gastroesophageal reflux disease)    Hearing loss    Hemorrhoids    HH (hiatus hernia)    Ischemic cardiomyopathy    a. ECHO 01/2014 EF 45-50% and akinesis of the entire inferior myocardium. Mild MR.   b. now resolved (EF 55-60% on 123456)     Metabolic syndrome    Myocardial infarction ([place]) 2015   Heart attack in 2015 and 2017   OSA (obstructive sleep apnea)    a. mild not requiring CPAP at this time   PAF (paroxysmal atrial fibrillation) ([place])    a. brief episode after cardiac cath on 05/16/15- resolved after IV amiodarone.   Tinnitus    Varicose veins    Past Surgical History:  Procedure Laterality Date   CARDIAC CATHETERIZATION N/A 05/16/2015   Procedure: Left Heart Cath and Coronary Angiography;  Surgeon: Belva Crome, MD;  Location: [place] CV [facility];  Service: Cardiovascular;  Laterality: N/A;   CARDIAC CATHETERIZATION N/A 05/16/2015   Procedure: Coronary Stent Intervention;  Surgeon: Belva Crome, MD;  Location: [place] [facility];  Service: Cardiovascular;  Laterality: N/A;   CARPAL TUNNEL RELEASE Bilateral 03/02/2015   Procedure: BILATERAL CARPAL TUNNEL RELEASE ;  Surgeon: Daryll Brod, MD;  Location: [place];  Service: Orthopedics;  Laterality: Bilateral;   COLONOSCOPY  07/21/2004   Dr. Benson Norway   CORONARY ANGIOPLASTY WITH STENT PLACEMENT  02/02/2014   BMS to RCA   ENDOVENOUS ABLATION SAPHENOUS VEIN W/ LASER Right 10/12/2014   EVLA right greater saphenous vein by Curt Jews MD   ESOPHAGEAL DILATION  07/23/2021   Procedure: ESOPHAGEAL DILATION;  Surgeon: Lavena Bullion, DO;  Location: [place];  Service: Endoscopy;;   ESOPHAGOGASTRODUODENOSCOPY N/A 07/23/2021   Procedure: ESOPHAGOGASTRODUODENOSCOPY (EGD);  Surgeon: Lavena Bullion, DO;  Location: [place];  Service: Endoscopy;  Laterality: N/A;   HIATAL HERNIA REPAIR N/A 07/23/2021   Procedure: LAPAROSCOPIC REPAIR OF HIATAL HERNIA, LAPOROSCOPIC   TRANSVERSE ABDOMINIS PLANE (TAP) BLOCK ;  Surgeon: Greer Pickerel, MD;  Location: [place];  Service: General;  Laterality: N/A;   LEFT HEART CATHETERIZATION WITH CORONARY ANGIOGRAM N/A 02/02/2014   Procedure: LEFT HEART CATHETERIZATION WITH CORONARY ANGIOGRAM;  Surgeon: Peter M Martinique, MD;  [facility] C[facility] LAB;  Service: Cardiovascular;  Laterality: N/A;   SKIN BIOPSY Left 06/19/2021   seborrheic keratosis   stab phlebectomy  Right 03/15/2015   stab phlebectomy > 20 incisions (right leg) by Curt Jews MD   TRANSORAL INCISIONLESS FUNDOPLICATION N/A [id number]   Procedure: TRANSORAL INCISIONLESS FUNDOPLICATION;  Surgeon: Lavena Bullion, DO;  Location: [place] OR;  Service: Endoscopy;  Laterality: N/A;   Family History  Problem Relation Age of Onset   Hypertension Mother    Cervical cancer Mother    Breast cancer Mother  Skin cancer Mother    Pancreatic cancer Brother    Heart disease Brother    Colon cancer Neg Hx    Esophageal cancer Neg Hx    Social History   Socioeconomic History   Marital status: Married    Spouse name: Not on file   Number of children: Not on file   Years of education: Not on file   Highest education level: Not on file  Occupational History   Not on file  Tobacco Use   Smoking status: Former   Smokeless tobacco: Never  Vaping Use   Vaping Use: Never used  Substance and Sexual Activity   Alcohol use: Yes    Alcohol/week: 3.0 standard drinks    Types: 3 Glasses of wine per week   Drug use: No   Sexual activity: Not Currently  Other Topics Concern   Not on file  Social History Narrative   Not on file   Social Determinants of Health   Financial Resource Strain: Low Risk    Difficulty of Paying Living Expenses: Not hard at all  Food Insecurity: No Food Insecurity   Worried About Charity fundraiser in the Last Year: Never true   [place] in the Last Year: Never true  Transportation Needs: No Transportation Needs   Lack of Transportation  (Medical): No   Lack of Transportation (Non-Medical): No  Physical Activity: Sufficiently Active   Days of Exercise per Week: 7 days   Minutes of Exercise per Session: 30 min  Stress: No Stress Concern Present   Feeling of Stress : Only a little  Social Connections: Not on file    Tobacco Counseling Counseling given: Not Answered   Clinical Intake:  Pre-visit preparation completed: Yes  Pain : 0-10 Pain Score: 4  Pain Type: Chronic pain Pain Location: Hip Pain Orientation: Right, Left Pain Onset: More than a month ago Pain Frequency: Constant     Nutritional Status: BMI 25 -29 Overweight Nutritional Risks: None Diabetes: No  How often do you need to have someone help you when you read instructions, pamphlets, or other written materials from your doctor or pharmacy?: 1 - Never  Diabetic? no  Interpreter Needed?: No  Information entered by :: NAllen LPN   Activities of Daily Living In your present state of health, do you have any difficulty performing the following activities: 09/27/2021 07/17/2021  Hearing? N Y  Comment - Bilateral Hearing Aids  Vision? N N  Difficulty concentrating or making decisions? N N  Walking or climbing stairs? N N  Dressing or bathing? N N  Doing errands, shopping? N N  Preparing Food and eating ? N -  Using the Toilet? N -  In the past six months, have you accidently leaked urine? Y -  Comment if held too long -  Do you have problems with loss of bowel control? N -  Managing your Medications? N -  Managing your Finances? N -  Housekeeping or managing your Housekeeping? N -  Some recent data might be hidden    Patient Care Team: Denita Lung, MD as PCP - General (Family Medicine) Martinique, Peter M, MD as PCP - Cardiology (Cardiology) Martinique, Peter M, MD as Consulting Physician (Cardiology)  Indicate any recent Medical Services you may have received from other than [facility] providers in the past year (date may be approximate).      Assessment:   This is a routine wellness examination for Henry Rogers.  Hearing/Vision screen Vision  Screening - Comments:: Regular eye exams, [facility]  Dietary issues and exercise activities discussed: Current Exercise Habits: Home exercise routine, Type of exercise: walking, Time (Minutes): 30, Frequency (Times/Week): 7, Weekly Exercise (Minutes/Week): 210   Goals Addressed             This Visit's Progress    Patient Stated       09/27/2021, stay healthy       Depression Screen PHQ 2/9 Scores 09/27/2021 03/14/2021 03/13/2020 03/01/2019 02/25/2018 02/19/2017 01/11/2016  PHQ - 2 Score 0 0 0 0 0 0 0    Fall Risk Fall Risk  09/27/2021 03/14/2021 03/13/2020 03/01/2019 02/25/2018  Falls in the past year? 0 0 0 0 No  Number falls in past yr: - 0 - - -  Injury with Fall? - 0 - - -  Risk for fall due to : Medication side effect - No Fall Risks - -  Follow up Falls evaluation completed;Education provided;Falls prevention discussed Falls evaluation completed - - -    FALL RISK PREVENTION PERTAINING TO THE HOME:  Any stairs in or around the home? Yes  If so, are there any without handrails? No  Home free of loose throw rugs in walkways, pet beds, electrical cords, etc? Yes  Adequate lighting in your home to reduce risk of falls? Yes   ASSISTIVE DEVICES UTILIZED TO PREVENT FALLS:  Life alert? No  Use of a cane, walker or w/c? No  Grab bars in the bathroom? Yes  Shower chair or bench in shower? No  Elevated toilet seat or a handicapped toilet? Yes   TIMED UP AND GO:  Was the test performed? No .  .     Cognitive Function:        Immunizations Immunization History  Administered Date(s) Administered   Fluad Quad(high Dose 65+) 03/24/2019   Hepatitis A, Adult 03/14/2021, 09/19/2021   Influenza Split 05/24/2013   Influenza, High Dose Seasonal PF 04/12/2014, 09/04/2015, 04/03/2018, 03/12/2020   Influenza, Seasonal, Injecte, Preservative Fre 03/24/2019    Influenza-Unspecified 04/07/2016, 04/01/2017, 06/01/2017, 07/10/2018, 04/18/2021   PFIZER Comirnaty(Gray Top)Covid-19 Tri-Sucrose Vaccine 11/20/2020   PFIZER(Purple Top)SARS-COV-2 Vaccination 08/27/2019, 09/21/2019, 04/07/2020   Pfizer Covid-19 Vaccine Bivalent Booster 22yrs & up 04/18/2021   Pneumococcal Conjugate-13 01/03/2015   Pneumococcal Polysaccharide-23 01/16/2009   Tdap 01/16/2009, 07/10/2018   Typhoid Live 04/03/2021   Zoster Recombinat (Shingrix) 04/01/2017, 06/01/2017   Zoster, Live 01/16/2009    TDAP status: Up to date  Flu Vaccine status: Up to date  Pneumococcal vaccine status: Up to date  Covid-19 vaccine status: Completed vaccines  Qualifies for Shingles Vaccine? Yes   Zostavax completed Yes   Shingrix Completed?: Yes  Screening Tests Health Maintenance  Topic Date Due   COLONOSCOPY (Pts 45-29yrs Insurance coverage will need to be confirmed)  03/28/2025   TETANUS/TDAP  07/10/2028   INFLUENZA VACCINE  Completed   COVID-19 Vaccine  Completed   Hepatitis C Screening  Completed   Zoster Vaccines- Shingrix  Completed   HPV VACCINES  Aged Out   Pneumonia Vaccine 91+ Years old  Discontinued    Health Maintenance  There are no preventive care reminders to display for this patient.  Colorectal cancer screening: Type of screening: Colonoscopy. Completed 9/8/20216. Repeat every 10 years  Lung Cancer Screening: (Low Dose CT Chest recommended if Age 13-80 years, 30 pack-year currently smoking OR have quit w/in 15years.) does not qualify.   Lung Cancer Screening Referral: no  Additional Screening:  Hepatitis C Screening: does  qualify; Completed 02/20/2012  Vision Screening: Recommended annual ophthalmology exams for early detection of glaucoma and other disorders of the eye. Is the patient up to date with their annual eye exam?  Yes  Who is the provider or what is the name of the office in which the patient attends annual eye exams? [facility] If pt is  not established with a provider, would they like to be referred to a provider to establish care? No .   Dental Screening: Recommended annual dental exams for proper oral hygiene  Community Resource Referral / Chronic Care Management: CRR required this visit?  No   CCM required this visit?  No      Plan:     I have personally reviewed and noted the following in the patients chart:   Medical and social history Use of alcohol, tobacco or illicit drugs  Current medications and supplements including opioid prescriptions. Patient is not currently taking opioid prescriptions. Functional ability and status Nutritional status Physical activity Advanced directives List of other physicians Hospitalizations, surgeries, and ER visits in previous 12 months Vitals Screenings to include cognitive, depression, and falls Referrals and appointments  In addition, I have reviewed and discussed with patient certain preventive protocols, quality metrics, and best practice recommendations. A written personalized care plan for preventive services as well as general preventive health recommendations were provided to patient.     Kellie Simmering, LPN   [id number]   Nurse Notes: 6 CIT not administered. Normal cognition per conversation.  Due to this being a virtual visit, the after visit summary with patients personalized plan was offered to patient via mail or my-chart. Patient would like to access on my-chart

## 2021-10-01 ENCOUNTER — Ambulatory Visit: Payer: Medicare PPO | Attending: Family Medicine

## 2021-10-01 ENCOUNTER — Other Ambulatory Visit: Payer: Self-pay

## 2021-10-01 DIAGNOSIS — G8929 Other chronic pain: Secondary | ICD-10-CM | POA: Diagnosis not present

## 2021-10-01 DIAGNOSIS — M25562 Pain in left knee: Secondary | ICD-10-CM | POA: Insufficient documentation

## 2021-10-01 DIAGNOSIS — M25551 Pain in right hip: Secondary | ICD-10-CM | POA: Insufficient documentation

## 2021-10-01 DIAGNOSIS — M25552 Pain in left hip: Secondary | ICD-10-CM | POA: Insufficient documentation

## 2021-10-01 NOTE — Therapy (Addendum)
?OUTPATIENT PHYSICAL THERAPY TREATMENT NOTE ? ? ?Patient Name: Henry Rogers ?MRN: [id number] ?DOB:[birth year], 71 y.o., male ?Today's Date: 10/01/2021 ? ?PCP: Denita Lung, MD ?REFERRING PROVIDER: Denita Lung, MD ? ? PT End of Session - 10/01/21 1057   ? ? Visit Number 2   ? Number of Visits 9   ? Date for PT Re-Evaluation 11/12/21   ? Authorization Type [employer] MCR   ? PT Start Time [id number]   arrived late  ? PT Stop Time 1130   ? PT Time Calculation (min) 32 min   ? Activity Tolerance Patient tolerated treatment well   ? Behavior During Therapy [facility] for tasks assessed/performed   ? ?  ?  ? ?  ? ? ?Past Medical History:  ?Diagnosis Date  ? BPH (benign prostatic hyperplasia)   ? CAD (coronary artery disease)   ? a. inf STEMI s/p BMS to mRCA (02/03/14)  b. inf STEMI s/p PCTA to RCA with slow reflow.  ? Chronic diastolic CHF (congestive heart failure) ([place])   ? a. 2D echo on 05/18/15 LVEF 55-60%, akinesis basal inferior wall, G2DD    ? GERD (gastroesophageal reflux disease)   ? Hearing loss   ? Hemorrhoids   ? HH (hiatus hernia)   ? Ischemic cardiomyopathy   ? a. ECHO 01/2014 EF 45-50% and akinesis of the entire inferior myocardium. Mild MR.   b. now resolved (EF 55-60% on 04/22/15)    ? Metabolic syndrome   ? Myocardial infarction [facility]) 2015  ? Heart attack in 2015 and 2017  ? OSA (obstructive sleep apnea)   ? a. mild not requiring CPAP at this time  ? PAF (paroxysmal atrial fibrillation) ([place])   ? a. brief episode after cardiac cath on 05/16/15- resolved after IV amiodarone.  ? Tinnitus   ? Varicose veins   ? ?Past Surgical History:  ?Procedure Laterality Date  ? CARDIAC CATHETERIZATION N/A 05/16/2015  ? Procedure: Left Heart Cath and Coronary Angiography;  Surgeon: Belva Crome, MD;  Location: [place] CV LAB;  Service: Cardiovascular;  Laterality: N/A;  ? CARDIAC CATHETERIZATION N/A 05/16/2015  ? Procedure: Coronary Stent Intervention;  Surgeon: Belva Crome, MD;  Location: [place] CV LAB;  Service:  Cardiovascular;  Laterality: N/A;  ? CARPAL TUNNEL RELEASE Bilateral 03/02/2015  ? Procedure: BILATERAL CARPAL TUNNEL RELEASE ;  Surgeon: Daryll Brod, MD;  Location: [place];  Service: Orthopedics;  Laterality: Bilateral;  ? COLONOSCOPY  07/21/2004  ? Dr. Benson Norway  ? CORONARY ANGIOPLASTY WITH STENT PLACEMENT  02/02/2014  ? BMS to RCA  ? ENDOVENOUS ABLATION SAPHENOUS VEIN W/ LASER Right 10/12/2014  ? EVLA right greater saphenous vein by Curt Jews MD  ? ESOPHAGEAL DILATION  07/23/2021  ? Procedure: ESOPHAGEAL DILATION;  Surgeon: Lavena Bullion, DO;  Location: [place] OR;  Service: Endoscopy;;  ? ESOPHAGOGASTRODUODENOSCOPY N/A 07/23/2021  ? Procedure: ESOPHAGOGASTRODUODENOSCOPY (EGD);  Surgeon: Lavena Bullion, DO;  Location: [place];  Service: Endoscopy;  Laterality: N/A;  ? HIATAL HERNIA REPAIR N/A 07/23/2021  ? Procedure: LAPAROSCOPIC REPAIR OF HIATAL HERNIA, LAPOROSCOPIC  TRANSVERSE ABDOMINIS PLANE (TAP) BLOCK ;  Surgeon: Greer Pickerel, MD;  Location: [place];  Service: General;  Laterality: N/A;  ? LEFT HEART CATHETERIZATION WITH CORONARY ANGIOGRAM N/A 02/02/2014  ? Procedure: LEFT HEART CATHETERIZATION WITH CORONARY ANGIOGRAM;  Surgeon: Peter M Martinique, MD;  Location: [facility] CATH LAB;  Service: Cardiovascular;  Laterality: N/A;  ? SKIN BIOPSY Left 06/19/2021  ? seborrheic keratosis  ?  stab phlebectomy  Right 03/15/2015  ? stab phlebectomy > 20 incisions (right leg) by Curt Jews MD  ? TRANSORAL INCISIONLESS FUNDOPLICATION N/A [id number]  ? Procedure: TRANSORAL INCISIONLESS FUNDOPLICATION;  Surgeon: Lavena Bullion, DO;  Location: [place];  Service: Endoscopy;  Laterality: N/A;  ? ?Patient Active Problem List  ? Diagnosis Date Noted  ? History of fundoplication   ? Hiatal hernia with GERD and esophagitis 07/23/2021  ? Gastroesophageal reflux disease with esophagitis without hemorrhage   ? Aortic atherosclerosis ([place]) 06/25/2021  ? Hiatal hernia 11/29/2020  ? Old inferior wall myocardial infarction 10/02/2016  ?  Former smoker 01/11/2016  ? Chronic diastolic CHF (congestive heart failure) ([place])   ? PAF (paroxysmal atrial fibrillation) ([place])   ? CAD (coronary artery disease)   ? OSA (obstructive sleep apnea)   ? Varicose veins of leg with complications 99991111  ? Hyperlipidemia LDL goal <70 02/22/2014  ? Metabolic syndrome 123456  ? Presbycusis of both ears   ? BPH (benign prostatic hyperplasia) 02/22/2013  ? Actinic keratosis 02/22/2013  ? ED (erectile dysfunction) 02/20/2012  ? Gastroesophageal reflux disease without esophagitis 02/20/2012  ? ? ?REFERRING DIAG:  ?M25.551,M25.552 (ICD-10-CM) - Bilateral hip pain ? ?THERAPY DIAG:  ?Pain in right hip ? ?Pain in left hip ? ?PERTINENT HISTORY:  ?Previous MI, CHF ? ?PRECAUTIONS:  ?None ? ?SUBJECTIVE:  ?Pt presents to PT with reports of continued bilateral hip pain. Has been compliant with HEP with no adverse effect. Pt is ready to begin PT at this time ? ?Pain: ?Are you having pain? Yes ?NPRS: 3/10 ?Pain Location: bilateral hips ?Pain Frequency/Description: intermittent  ?Aggravating factors: yard work, physical activity ?Relieving factors: rest ? ? ?OBJECTIVE:  ?  ?PATIENT SURVEYS:  ?FOTO 69% function; 74% predicted ?  ?COGNITION: ?         Overall cognitive status: Within functional limits for tasks assessed              ?          ?POSTURE:  ?Small body habitus, slumped sitting posture, fwd head, anteriorly rotated pelvis ?  ?PALPATION: ?TTP to bilateral glute medius, greater trochanteric musculature ?  ?LE AROM/PROM: ?  ?AROM Right ?09/17/2021 Left ?09/17/2021  ?Hip flexion       ?Hip extension      ?Hip abduction      ?Hip adduction      ?Hip internal rotation 9 12  ?Hip external rotation 35 33  ?Knee flexion      ?Knee extension      ?Ankle dorsiflexion      ?Ankle plantarflexion      ?Ankle inversion      ?Ankle eversion      ?(Blank rows = not tested) ?  ?LE MMT: ?  ?MMT Right ?09/17/2021 Left ?09/17/2021  ?Hip flexion  5/5 5/5  ?Hip extension      ?Hip abduction 5/5  5/5  ?Hip adduction      ?Hip external rotation      ?Hip internal rotation      ?Knee extension 5/5 5/5  ?Knee flexion 5/5 5/5  ?Ankle dorsiflexion       ?Ankle plantarflexion      ?Ankle inversion      ?Ankle eversion      ?Grossly      ?(Blank rows = not tested) ?  ?  ?LOWER EXTREMITY SPECIAL TESTS:  ?Hip special tests: Saralyn Pilar (FABER) test: negative, Thomas test: positive , and Hip scouring test: negative ?  ?  FUNCTIONAL TESTS:  ?30 Second Sit to Stand: 17 reps ?  ?GAIT: ?Distance walked: 31ft ?Assistive device utilized: None ?Level of assistance: Complete Independence ?Comments: no overt gait deviation ?  ?TODAY'S TREATMENT: ?[facility] Adult PT Treatment:                                                DATE: 10/01/2021 ?Therapeutic Exercise: ?NuStep lvl 7 UE/LE x 2.3 min while taking subjective ?Bridge with clamshell 2x15 GTB ?Sidelying clamshell 2x10 GTB each ?Supine SLR 2x10 2.5# ?Sidelying hip abd 2x10 2.5# ?DKTC x 30" ?Supine piriformis stretch 2x30" ?Supine figure 4 stretch 2x30" each ? ?[facility] Adult PT Treatment:                                                DATE: 09/17/2021 ?Therapeutic Exercise: ?Bridge x 10 ?Modified thomas stretch x 30" R ?Supine piriformis stretch x 30" R ?Supine figure 4 stretch x 30" each ?Self Care: ?Education on tennis ball massage and trigger point release to bilateral hip at home ?  ?PATIENT EDUCATION:  ?Education details: eval findings, FOTO, HEP, POC ?Person educated: Patient ?Education method: Explanation, Demonstration, and Handouts ?Education comprehension: verbalized understanding and returned demonstration ?  ?  ?HOME EXERCISE PROGRAM: ?Access Code: [email or web address] ?URL: [email or web address] ?Date: 09/17/2021 ?Prepared by: Octavio Manns ?  ?Exercises ?Modified Thomas Stretch - 1 x daily - 7 x weekly - 2 reps - 60 sec hold ?Supine Bridge - 1 x daily - 7 x weekly - 2-3 sets - 10 reps - 3 sec hold ?Supine Piriformis Stretch with Leg Straight - 1 x daily - 7 x weekly - 2 reps -  30 sec hold ?Supine Figure 4 Piriformis Stretch - 1 x daily - 7 x weekly - 2 reps - 30 sec hold ?Supine Figure 4 Piriformis Stretch - 1 x daily - 7 x weekly - 2 reps - 30 sec hold ?Seated Piriformis Stretch - 1

## 2021-10-03 NOTE — Progress Notes (Signed)
?Cardiology Office Note:   ? ?Date:  10/08/2021  ? ?ID:  Henry Rogers, DOB 1951-05-23, MRN [id number] ? ?PCP:  Henry Nian, MD  ?Cardiologist:  Henry Beltran [place], MD  ?Electrophysiologist:  None  ? ?Referring MD: Henry Nian, MD  ? ?Chief Complaint  ?Patient presents with  ? Coronary Artery Disease  ? ? ? ?History of Present Illness:   ? ?Henry Rogers is a 71 y.o. male with a hx of HLD, OSA, chronic diastolic heart failure and CAD.  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  He also had a cardiogenic shock.  RCA was stented with a 5 x 16 mm Veriflex stent postdilated to 6 mm.  EF was 45 to 50%.  The other coronary vessels did not have significant disease.  Repeat echocardiogram in September 2015 showed LV function was normalized to 55 to 60%.  He was readmitted in October 2016 with recurrent inferior infarction again associated with cardiogenic shock.  The stent in the mid RCA was patent however distal RCA was occluded was heavy thrombus.  This was treated with balloon angioplasty.  He had a transient episode of paroxysmal atrial fibrillation after the cardiac catheterization, this was resolved after IV amiodarone and has not recurred since.  He is not on any systemic anticoagulation due to the brief episodes without recurrence. He is on chronic DAPT. ? ?He did undergo laproscopic repair of hiatal hernia on 07/23/21 by Dr Henry Rogers. He notes his reflux symptoms have completely resolved. He does have some degeneration in lower spine. Has some pain radiating to hips. He is getting PT and seeing a chiropractor.  ? ? ? ? ? ?Past Medical History:  ?Diagnosis Date  ? BPH (benign prostatic hyperplasia)   ? CAD (coronary artery disease)   ? a. inf STEMI s/p BMS to mRCA (02/03/14)  b. inf STEMI s/p PCTA to RCA with slow reflow.  ? Chronic diastolic CHF (congestive heart failure) (HCC)   ? a. 2D echo on 05/18/15 LVEF 55-60%, akinesis basal inferior  wall, G2DD    ? GERD (gastroesophageal reflux disease)   ? Hearing loss   ? Hemorrhoids   ? HH (hiatus hernia)   ? Ischemic cardiomyopathy   ? a. ECHO 01/2014 EF 45-50% and akinesis of the entire inferior myocardium. Mild MR.   b. now resolved (EF 55-60% on 04/22/15)    ? Metabolic syndrome   ? Myocardial infarction [facility]) 2015  ? Heart attack in 2015 and 2017  ? OSA (obstructive sleep apnea)   ? a. mild not requiring CPAP at this time  ? PAF (paroxysmal atrial fibrillation) (HCC)   ? a. brief episode after cardiac cath on 05/16/15- resolved after IV amiodarone.  ? Tinnitus   ? Varicose veins   ? ? ?Past Surgical History:  ?Procedure Laterality Date  ? CARDIAC CATHETERIZATION N/A 05/16/2015  ? Procedure: Left Heart Cath and Coronary Angiography;  Surgeon: Lyn Records, MD;  Location: [facility] INVASIVE CV LAB;  Service: Cardiovascular;  Laterality: N/A;  ? CARDIAC CATHETERIZATION N/A 05/16/2015  ? Procedure: Coronary Stent Intervention;  Surgeon: Lyn Records, MD;  Location: [facility] INVASIVE CV LAB;  Service: Cardiovascular;  Laterality: N/A;  ? CARPAL TUNNEL RELEASE Bilateral 03/02/2015  ? Procedure: BILATERAL CARPAL TUNNEL RELEASE ;  Surgeon: Henry Salt, MD;  Location: [facility];  Service: Orthopedics;  Laterality: Bilateral;  ? COLONOSCOPY  07/21/2004  ? Dr. Elnoria Rogers  ?  CORONARY ANGIOPLASTY WITH STENT PLACEMENT  02/02/2014  ? BMS to RCA  ? ENDOVENOUS ABLATION SAPHENOUS VEIN W/ LASER Right 10/12/2014  ? EVLA right greater saphenous vein by Henry Began MD  ? ESOPHAGEAL DILATION  07/23/2021  ? Procedure: ESOPHAGEAL DILATION;  Surgeon: Henry Cleverly, DO;  Location: [facility] OR;  Service: Endoscopy;;  ? ESOPHAGOGASTRODUODENOSCOPY N/A 07/23/2021  ? Procedure: ESOPHAGOGASTRODUODENOSCOPY (EGD);  Surgeon: Henry Cleverly, DO;  Location: [facility] OR;  Service: Endoscopy;  Laterality: N/A;  ? HIATAL HERNIA REPAIR N/A 07/23/2021  ? Procedure: LAPAROSCOPIC REPAIR OF HIATAL HERNIA, LAPOROSCOPIC  TRANSVERSE ABDOMINIS PLANE (TAP) BLOCK ;   Surgeon: Henry Adu, MD;  Location: [facility] OR;  Service: General;  Laterality: N/A;  ? LEFT HEART CATHETERIZATION WITH CORONARY ANGIOGRAM N/A 02/02/2014  ? Procedure: LEFT HEART CATHETERIZATION WITH CORONARY ANGIOGRAM;  Surgeon: Henry Droessler M [place], MD;  Location: [facility] CATH LAB;  Service: Cardiovascular;  Laterality: N/A;  ? SKIN BIOPSY Left 06/19/2021  ? seborrheic keratosis  ? stab phlebectomy  Right 03/15/2015  ? stab phlebectomy > 20 incisions (right leg) by Henry Began MD  ? TRANSORAL INCISIONLESS FUNDOPLICATION N/A 07/23/2021  ? Procedure: TRANSORAL INCISIONLESS FUNDOPLICATION;  Surgeon: Henry Cleverly, DO;  Location: [facility] OR;  Service: Endoscopy;  Laterality: N/A;  ? ? ?Current Medications: ?Current Meds  ?Medication Sig  ? aspirin 81 MG tablet Take 81 mg by mouth daily.  ? atorvastatin (LIPITOR) 80 MG tablet TAKE 1 TABLET(80 MG) BY MOUTH DAILY AT 6 PM  ? Melatonin 10 MG CAPS Take 10 mg by mouth at bedtime.  ? metoprolol tartrate (LOPRESSOR) 25 MG tablet TAKE 1 TABLET(25 MG) BY MOUTH TWICE DAILY  ? Multiple Vitamin (MULTIVITAMIN) tablet Take 1 tablet by mouth every evening. 50 Plus  ? nitroGLYCERIN (NITROSTAT) 0.4 MG SL tablet Place 1 tablet (0.4 mg total) under the tongue every 5 (five) minutes as needed for chest pain. MAX 3 doses  ? Nutritional Supplements (JUICE PLUS FIBRE PO) Take 1 tablet by mouth 2 (two) times daily. Fruit blend and vegetable blend  ? omega-3 acid ethyl esters (LOVAZA) 1 g capsule Take 2 capsules (2 g total) by mouth 2 (two) times daily. (Patient taking differently: Take 1 g by mouth 2 (two) times daily.)  ? tadalafil (CIALIS) 5 MG tablet Take 1 tablet (5 mg total) by mouth daily.  ? ticagrelor (BRILINTA) 60 MG TABS tablet TAKE 1 TABLET(60 MG) BY MOUTH TWICE DAILY  ? zolpidem (AMBIEN) 5 MG tablet TAKE 1 TABLET BY MOUTH AT BEDTIME AS NEEDED FOR SLEEP  ?  ? ?Allergies:   Ace inhibitors  ? ?Social History  ? ?Socioeconomic History  ? Marital status: Married  ?  Spouse name: Not on file  ? Number of  children: Not on file  ? Years of education: Not on file  ? Highest education level: Not on file  ?Occupational History  ? Not on file  ?Tobacco Use  ? Smoking status: Former  ? Smokeless tobacco: Never  ?Vaping Use  ? Vaping Use: Never used  ?Substance and Sexual Activity  ? Alcohol use: Yes  ?  Alcohol/week: 3.0 standard drinks  ?  Types: 3 Glasses of wine per week  ? Drug use: No  ? Sexual activity: Not Currently  ?Other Topics Concern  ? Not on file  ?Social History Narrative  ? Not on file  ? ?Social Determinants of Health  ? ?Financial Resource Strain: Low Risk   ? Difficulty of Paying Living Expenses: Not hard at  all  ?Food Insecurity: No Food Insecurity  ? Worried About Programme researcher, broadcasting/film/video in the Last Year: Never true  ? Ran Out of Food in the Last Year: Never true  ?Transportation Needs: No Transportation Needs  ? Lack of Transportation (Medical): No  ? Lack of Transportation (Non-Medical): No  ?Physical Activity: Sufficiently Active  ? Days of Exercise per Week: 7 days  ? Minutes of Exercise per Session: 30 min  ?Stress: No Stress Concern Present  ? Feeling of Stress : Only a little  ?Social Connections: Not on file  ?  ? ?Family History: ?The patient's family history includes Breast cancer in his mother; Cervical cancer in his mother; Heart disease in his brother; Hypertension in his mother; Pancreatic cancer in his brother; Skin cancer in his mother. There is no history of Colon cancer or Esophageal cancer. ? ?ROS:   ?Please see the history of present illness.    ? All other systems reviewed and are negative. ? ?EKGs/Labs/Other Studies Reviewed:   ? ?The following studies were reviewed today: ? ?Cath 05/16/2015 ?Mid RCA to Dist RCA lesion, 10% stenosed. The lesion was previously treated with a stent (unknown type). ?Prox RCA to Mid RCA lesion, 25% stenosed. ?Prox LAD to Dist LAD lesion, 40% stenosed. ?Dist RCA lesion, 100% stenosed. Post intervention, there is a 10% residual stenosis. ?  ?Acute inferior  myocardial infarction presenting with cardiogenic shock. ?PTCA of the distal RCA from 100% to less than 10%. The vessel is large and ectatic containing heavy thrombus. Reperfusion was complicated by s

## 2021-10-08 ENCOUNTER — Ambulatory Visit: Payer: Medicare PPO | Admitting: Cardiology

## 2021-10-08 ENCOUNTER — Other Ambulatory Visit: Payer: Self-pay

## 2021-10-08 ENCOUNTER — Encounter: Payer: Self-pay | Admitting: Cardiology

## 2021-10-08 VITALS — BP 120/78 | HR 64 | Ht 66.0 in | Wt 184.0 lb

## 2021-10-08 DIAGNOSIS — E785 Hyperlipidemia, unspecified: Secondary | ICD-10-CM | POA: Diagnosis not present

## 2021-10-08 DIAGNOSIS — I2510 Atherosclerotic heart disease of native coronary artery without angina pectoris: Secondary | ICD-10-CM

## 2021-10-09 ENCOUNTER — Ambulatory Visit: Payer: Medicare PPO

## 2021-10-09 DIAGNOSIS — G8929 Other chronic pain: Secondary | ICD-10-CM | POA: Diagnosis not present

## 2021-10-09 DIAGNOSIS — M25551 Pain in right hip: Secondary | ICD-10-CM

## 2021-10-09 DIAGNOSIS — M25552 Pain in left hip: Secondary | ICD-10-CM | POA: Diagnosis not present

## 2021-10-09 DIAGNOSIS — M25562 Pain in left knee: Secondary | ICD-10-CM | POA: Diagnosis not present

## 2021-10-09 NOTE — Therapy (Signed)
?OUTPATIENT PHYSICAL THERAPY TREATMENT NOTE ? ? ?Patient Name: Henry Rogers ?MRN: [id number] ?DOB:04-Aug-1950, 71 y.o., male ?Today's Date: 10/09/2021 ? ?PCP: Ronnald Nian, MD ?REFERRING PROVIDER: Ronnald Nian, MD ? ? PT End of Session - 10/09/21 1049   ? ? Visit Number 3   ? Number of Visits 9   ? Date for PT Re-Evaluation 11/12/21   ? Authorization Type [employer] MCR   ? PT Start Time 1049   ? PT Stop Time 1128   ? PT Time Calculation (min) 39 min   ? Activity Tolerance Patient tolerated treatment well   ? Behavior During Therapy [facility] for tasks assessed/performed   ? ?  ?  ? ?  ? ? ? ?Past Medical History:  ?Diagnosis Date  ? BPH (benign prostatic hyperplasia)   ? CAD (coronary artery disease)   ? a. inf STEMI s/p BMS to mRCA (02/03/14)  b. inf STEMI s/p PCTA to RCA with slow reflow.  ? Chronic diastolic CHF (congestive heart failure) (HCC)   ? a. 2D echo on 05/18/15 LVEF 55-60%, akinesis basal inferior wall, G2DD    ? GERD (gastroesophageal reflux disease)   ? Hearing loss   ? Hemorrhoids   ? HH (hiatus hernia)   ? Ischemic cardiomyopathy   ? a. ECHO 01/2014 EF 45-50% and akinesis of the entire inferior myocardium. Mild MR.   b. now resolved (EF 55-60% on 04/22/15)    ? Metabolic syndrome   ? Myocardial infarction [facility]) 2015  ? Heart attack in 2015 and 2017  ? OSA (obstructive sleep apnea)   ? a. mild not requiring CPAP at this time  ? PAF (paroxysmal atrial fibrillation) (HCC)   ? a. brief episode after cardiac cath on 05/16/15- resolved after IV amiodarone.  ? Tinnitus   ? Varicose veins   ? ?Past Surgical History:  ?Procedure Laterality Date  ? CARDIAC CATHETERIZATION N/A 05/16/2015  ? Procedure: Left Heart Cath and Coronary Angiography;  Surgeon: Lyn Records, MD;  Location: [facility] INVASIVE CV LAB;  Service: Cardiovascular;  Laterality: N/A;  ? CARDIAC CATHETERIZATION N/A 05/16/2015  ? Procedure: Coronary Stent Intervention;  Surgeon: Lyn Records, MD;  Location: [facility] INVASIVE CV LAB;  Service: Cardiovascular;   Laterality: N/A;  ? CARPAL TUNNEL RELEASE Bilateral 03/02/2015  ? Procedure: BILATERAL CARPAL TUNNEL RELEASE ;  Surgeon: Cindee Salt, MD;  Location: [facility];  Service: Orthopedics;  Laterality: Bilateral;  ? COLONOSCOPY  07/21/2004  ? Dr. Elnoria Howard  ? CORONARY ANGIOPLASTY WITH STENT PLACEMENT  02/02/2014  ? BMS to RCA  ? ENDOVENOUS ABLATION SAPHENOUS VEIN W/ LASER Right 10/12/2014  ? EVLA right greater saphenous vein by Gretta Began MD  ? ESOPHAGEAL DILATION  07/23/2021  ? Procedure: ESOPHAGEAL DILATION;  Surgeon: Shellia Cleverly, DO;  Location: [facility] OR;  Service: Endoscopy;;  ? ESOPHAGOGASTRODUODENOSCOPY N/A 07/23/2021  ? Procedure: ESOPHAGOGASTRODUODENOSCOPY (EGD);  Surgeon: Shellia Cleverly, DO;  Location: [facility] OR;  Service: Endoscopy;  Laterality: N/A;  ? HIATAL HERNIA REPAIR N/A 07/23/2021  ? Procedure: LAPAROSCOPIC REPAIR OF HIATAL HERNIA, LAPOROSCOPIC  TRANSVERSE ABDOMINIS PLANE (TAP) BLOCK ;  Surgeon: Gaynelle Adu, MD;  Location: [facility] OR;  Service: General;  Laterality: N/A;  ? LEFT HEART CATHETERIZATION WITH CORONARY ANGIOGRAM N/A 02/02/2014  ? Procedure: LEFT HEART CATHETERIZATION WITH CORONARY ANGIOGRAM;  Surgeon: Peter M [place], MD;  Location: [facility] CATH LAB;  Service: Cardiovascular;  Laterality: N/A;  ? SKIN BIOPSY Left 06/19/2021  ? seborrheic keratosis  ? stab  phlebectomy  Right 03/15/2015  ? stab phlebectomy > 20 incisions (right leg) by Curt Jews MD  ? TRANSORAL INCISIONLESS FUNDOPLICATION N/A [id number]  ? Procedure: TRANSORAL INCISIONLESS FUNDOPLICATION;  Surgeon: Lavena Bullion, DO;  Location: [place];  Service: Endoscopy;  Laterality: N/A;  ? ?Patient Active Problem List  ? Diagnosis Date Noted  ? History of fundoplication   ? Hiatal hernia with GERD and esophagitis 07/23/2021  ? Gastroesophageal reflux disease with esophagitis without hemorrhage   ? Aortic atherosclerosis ([place]) 06/25/2021  ? Hiatal hernia 11/29/2020  ? Old inferior wall myocardial infarction 10/02/2016  ? Former smoker  01/11/2016  ? Chronic diastolic CHF (congestive heart failure) ([place])   ? PAF (paroxysmal atrial fibrillation) ([place])   ? CAD (coronary artery disease)   ? OSA (obstructive sleep apnea)   ? Varicose veins of leg with complications [id number]  ? Hyperlipidemia LDL goal <70 02/22/2014  ? Metabolic syndrome [id number]  ? Presbycusis of both ears   ? BPH (benign prostatic hyperplasia) 02/22/2013  ? Actinic keratosis 02/22/2013  ? ED (erectile dysfunction) 02/20/2012  ? Gastroesophageal reflux disease without esophagitis 02/20/2012  ? ? ?REFERRING DIAG:  ?M25.551,M25.552 (ICD-10-CM) - Bilateral hip pain ? ?THERAPY DIAG:  ?Pain in right hip ? ?Pain in left hip ? ?Chronic pain of left knee ? ?PERTINENT HISTORY:  ?Previous MI, CHF ? ?PRECAUTIONS:  ?None ? ?SUBJECTIVE:  ?Pt presents to PT with reports of continued hip pain. The day after last session he noted that his back started bothering him for a few days, but has resolved now. Has been compliant with HEP with no adverse effect. Pt is ready to begin PT at this time.  ? ?Pain: ?Are you having pain? Yes ?NPRS: 2/10 ?Pain Location: bilateral hips ?Pain Frequency/Description: intermittent  ?Aggravating factors: yard work, physical activity ?Relieving factors: rest ? ? ?OBJECTIVE:  ?  ?PATIENT SURVEYS:  ?FOTO 69% function; 74% predicted ?  ?COGNITION: ?         Overall cognitive status: Within functional limits for tasks assessed              ?          ?POSTURE:  ?Small body habitus, slumped sitting posture, fwd head, anteriorly rotated pelvis ?  ?PALPATION: ?TTP to bilateral glute medius, greater trochanteric musculature ?  ?LE AROM/PROM: ?  ?AROM Right ?09/17/2021 Left ?09/17/2021  ?Hip flexion       ?Hip extension      ?Hip abduction      ?Hip adduction      ?Hip internal rotation 9 12  ?Hip external rotation 35 33  ?Knee flexion      ?Knee extension      ?Ankle dorsiflexion      ?Ankle plantarflexion      ?Ankle inversion      ?Ankle eversion      ?(Blank rows = not  tested) ?  ?LE MMT: ?  ?MMT Right ?09/17/2021 Left ?09/17/2021  ?Hip flexion  5/5 5/5  ?Hip extension      ?Hip abduction 5/5 5/5  ?Hip adduction      ?Hip external rotation      ?Hip internal rotation      ?Knee extension 5/5 5/5  ?Knee flexion 5/5 5/5  ?Ankle dorsiflexion       ?Ankle plantarflexion      ?Ankle inversion      ?Ankle eversion      ?Grossly      ?(Blank rows = not tested) ?  ?  ?  LOWER EXTREMITY SPECIAL TESTS:  ?Hip special tests: Saralyn Pilar (FABER) test: negative, Thomas test: positive , and Hip scouring test: negative ?  ?FUNCTIONAL TESTS:  ?30 Second Sit to Stand: 17 reps ?  ?GAIT: ?Distance walked: 7ft ?Assistive device utilized: None ?Level of assistance: Complete Independence ?Comments: no overt gait deviation ?  ?TODAY'S TREATMENT: ?[facility] Adult PT Treatment:                                                DATE: 10/09/2021 ?Therapeutic Exercise: ?NuStep lvl 7 UE/LE x 3 min while taking subjective ?Lateral walk RTB 3x55ft ?Bridge with clamshell 2x15 GTB ?Sidelying clamshell 2x15 GTB each ?STS 3x10 - 10lb KB ?Modified thomas stretch x 60" each ?Leg press x 10 55#, 2x10 65# ?Standing hip abd/ext 2x10 RTB ?DKTC x 30" ?Supine piriformis stretch 2x30" ?Supine figure 4 stretch 2x30" each ?DKTC pball roll x 10 - 5" hold ?Bridge on pball x 10  ? ?[facility] Adult PT Treatment:                                                DATE: 10/01/2021 ?Therapeutic Exercise: ?NuStep lvl 7 UE/LE x 2.3 min while taking subjective ?Bridge with clamshell 2x15 GTB ?Sidelying clamshell 2x10 GTB each ?Supine SLR 2x10 2.5# ?Sidelying hip abd 2x10 2.5# ?DKTC x 30" ?Supine piriformis stretch 2x30" ?Supine figure 4 stretch 2x30" each ? ?[facility] Adult PT Treatment:                                                DATE: 09/17/2021 ?Therapeutic Exercise: ?Bridge x 10 ?Modified thomas stretch x 30" R ?Supine piriformis stretch x 30" R ?Supine figure 4 stretch x 30" each ?Self Care: ?Education on tennis ball massage and trigger point release to  bilateral hip at home ?  ?PATIENT EDUCATION:  ?Education details: eval findings, FOTO, HEP, POC ?Person educated: Patient ?Education method: Explanation, Demonstration, and Handouts ?Education comprehension: verbalized und

## 2021-10-10 DIAGNOSIS — M9903 Segmental and somatic dysfunction of lumbar region: Secondary | ICD-10-CM | POA: Diagnosis not present

## 2021-10-10 DIAGNOSIS — M9905 Segmental and somatic dysfunction of pelvic region: Secondary | ICD-10-CM | POA: Diagnosis not present

## 2021-10-10 DIAGNOSIS — M9902 Segmental and somatic dysfunction of thoracic region: Secondary | ICD-10-CM | POA: Diagnosis not present

## 2021-10-10 DIAGNOSIS — M5441 Lumbago with sciatica, right side: Secondary | ICD-10-CM | POA: Diagnosis not present

## 2021-10-17 ENCOUNTER — Ambulatory Visit: Payer: Medicare PPO

## 2021-10-17 DIAGNOSIS — M25562 Pain in left knee: Secondary | ICD-10-CM | POA: Diagnosis not present

## 2021-10-17 DIAGNOSIS — M25551 Pain in right hip: Secondary | ICD-10-CM | POA: Diagnosis not present

## 2021-10-17 DIAGNOSIS — M25552 Pain in left hip: Secondary | ICD-10-CM

## 2021-10-17 DIAGNOSIS — G8929 Other chronic pain: Secondary | ICD-10-CM | POA: Diagnosis not present

## 2021-10-17 NOTE — Therapy (Signed)
?OUTPATIENT PHYSICAL THERAPY TREATMENT NOTE ? ? ?Patient Name: Henry Rogers ?MRN: [id number] ?DOB:[birth year], 71 y.o., male ?Today's Date: 10/17/2021 ? ?PCP: Ronnald Nian, MD ?REFERRING PROVIDER: Ronnald Nian, MD ? ? PT End of Session - 10/17/21 1054   ? ? Visit Number 4   ? Number of Visits 9   ? Date for PT Re-Evaluation 11/12/21   ? Authorization Type [employer] MCR   ? PT Start Time 1055   ? PT Stop Time 1128   ? PT Time Calculation (min) 33 min   ? Activity Tolerance Patient tolerated treatment well   ? Behavior During Therapy [facility] for tasks assessed/performed   ? ?  ?  ? ?  ? ? ? ? ?Past Medical History:  ?Diagnosis Date  ? BPH (benign prostatic hyperplasia)   ? CAD (coronary artery disease)   ? a. inf STEMI s/p BMS to mRCA (02/03/14)  b. inf STEMI s/p PCTA to RCA with slow reflow.  ? Chronic diastolic CHF (congestive heart failure) (HCC)   ? a. 2D echo on 05/18/15 LVEF 55-60%, akinesis basal inferior wall, G2DD    ? GERD (gastroesophageal reflux disease)   ? Hearing loss   ? Hemorrhoids   ? HH (hiatus hernia)   ? Ischemic cardiomyopathy   ? a. ECHO 01/2014 EF 45-50% and akinesis of the entire inferior myocardium. Mild MR.   b. now resolved (EF 55-60% on 04/22/15)    ? Metabolic syndrome   ? Myocardial infarction [facility]) 2015  ? Heart attack in 2015 and 2017  ? OSA (obstructive sleep apnea)   ? a. mild not requiring CPAP at this time  ? PAF (paroxysmal atrial fibrillation) (HCC)   ? a. brief episode after cardiac cath on 05/16/15- resolved after IV amiodarone.  ? Tinnitus   ? Varicose veins   ? ?Past Surgical History:  ?Procedure Laterality Date  ? CARDIAC CATHETERIZATION N/A 05/16/2015  ? Procedure: Left Heart Cath and Coronary Angiography;  Surgeon: Lyn Records, MD;  Location: [facility] INVASIVE CV LAB;  Service: Cardiovascular;  Laterality: N/A;  ? CARDIAC CATHETERIZATION N/A 05/16/2015  ? Procedure: Coronary Stent Intervention;  Surgeon: Lyn Records, MD;  Location: [facility] INVASIVE CV LAB;  Service: Cardiovascular;   Laterality: N/A;  ? CARPAL TUNNEL RELEASE Bilateral 03/02/2015  ? Procedure: BILATERAL CARPAL TUNNEL RELEASE ;  Surgeon: Cindee Salt, MD;  Location: [facility];  Service: Orthopedics;  Laterality: Bilateral;  ? COLONOSCOPY  07/21/2004  ? Dr. Elnoria Howard  ? CORONARY ANGIOPLASTY WITH STENT PLACEMENT  02/02/2014  ? BMS to RCA  ? ENDOVENOUS ABLATION SAPHENOUS VEIN W/ LASER Right 10/12/2014  ? EVLA right greater saphenous vein by Gretta Began MD  ? ESOPHAGEAL DILATION  07/23/2021  ? Procedure: ESOPHAGEAL DILATION;  Surgeon: Shellia Cleverly, DO;  Location: [facility] OR;  Service: Endoscopy;;  ? ESOPHAGOGASTRODUODENOSCOPY N/A 07/23/2021  ? Procedure: ESOPHAGOGASTRODUODENOSCOPY (EGD);  Surgeon: Shellia Cleverly, DO;  Location: [facility] OR;  Service: Endoscopy;  Laterality: N/A;  ? HIATAL HERNIA REPAIR N/A 07/23/2021  ? Procedure: LAPAROSCOPIC REPAIR OF HIATAL HERNIA, LAPOROSCOPIC  TRANSVERSE ABDOMINIS PLANE (TAP) BLOCK ;  Surgeon: Gaynelle Adu, MD;  Location: [facility] OR;  Service: General;  Laterality: N/A;  ? LEFT HEART CATHETERIZATION WITH CORONARY ANGIOGRAM N/A 02/02/2014  ? Procedure: LEFT HEART CATHETERIZATION WITH CORONARY ANGIOGRAM;  Surgeon: Peter M Swaziland, MD;  Location: [facility] CATH LAB;  Service: Cardiovascular;  Laterality: N/A;  ? SKIN BIOPSY Left 06/19/2021  ? seborrheic keratosis  ?  stab phlebectomy  Right 03/15/2015  ? stab phlebectomy > 20 incisions (right leg) by Gretta Began MD  ? TRANSORAL INCISIONLESS FUNDOPLICATION N/A 07/23/2021  ? Procedure: TRANSORAL INCISIONLESS FUNDOPLICATION;  Surgeon: Shellia Cleverly, DO;  Location: [facility] OR;  Service: Endoscopy;  Laterality: N/A;  ? ?Patient Active Problem List  ? Diagnosis Date Noted  ? History of fundoplication   ? Hiatal hernia with GERD and esophagitis 07/23/2021  ? Gastroesophageal reflux disease with esophagitis without hemorrhage   ? Aortic atherosclerosis (HCC) 06/25/2021  ? Hiatal hernia 11/29/2020  ? Old inferior wall myocardial infarction 10/02/2016  ? Former smoker  01/11/2016  ? Chronic diastolic CHF (congestive heart failure) (HCC)   ? PAF (paroxysmal atrial fibrillation) (HCC)   ? CAD (coronary artery disease)   ? OSA (obstructive sleep apnea)   ? Varicose veins of leg with complications 08/22/2014  ? Hyperlipidemia LDL goal <70 02/22/2014  ? Metabolic syndrome 02/22/2014  ? Presbycusis of both ears   ? BPH (benign prostatic hyperplasia) 02/22/2013  ? Actinic keratosis 02/22/2013  ? ED (erectile dysfunction) 02/20/2012  ? Gastroesophageal reflux disease without esophagitis 02/20/2012  ? ? ?REFERRING DIAG:  ?M25.551,M25.552 (ICD-10-CM) - Bilateral hip pain ? ?THERAPY DIAG:  ?Pain in right hip ? ?Pain in left hip ? ?PERTINENT HISTORY:  ?Previous MI, CHF ? ?PRECAUTIONS:  ?None ? ?SUBJECTIVE:  ?Pt presents to PT with continued slight pain in bilateral hips. Has been compliant with HEP with no adverse effect. Pt is ready to PT at this time.  ? ?Pain: ?Are you having pain? Yes ?NPRS: 2/10 ?Pain Location: bilateral hips ?Pain Frequency/Description: intermittent  ?Aggravating factors: yard work, physical activity ?Relieving factors: rest ? ? ?OBJECTIVE:  ?  ?PATIENT SURVEYS:  ?FOTO 69% function; 74% predicted ?  ?COGNITION: ?         Overall cognitive status: Within functional limits for tasks assessed              ?          ?POSTURE:  ?Small body habitus, slumped sitting posture, fwd head, anteriorly rotated pelvis ?  ?PALPATION: ?TTP to bilateral glute medius, greater trochanteric musculature ?  ?LE AROM/PROM: ?  ?AROM Right ?09/17/2021 Left ?09/17/2021  ?Hip flexion       ?Hip extension      ?Hip abduction      ?Hip adduction      ?Hip internal rotation 9 12  ?Hip external rotation 35 33  ?Knee flexion      ?Knee extension      ?Ankle dorsiflexion      ?Ankle plantarflexion      ?Ankle inversion      ?Ankle eversion      ?(Blank rows = not tested) ?  ?LE MMT: ?  ?MMT Right ?09/17/2021 Left ?09/17/2021  ?Hip flexion  5/5 5/5  ?Hip extension      ?Hip abduction 5/5 5/5  ?Hip adduction       ?Hip external rotation      ?Hip internal rotation      ?Knee extension 5/5 5/5  ?Knee flexion 5/5 5/5  ?Ankle dorsiflexion       ?Ankle plantarflexion      ?Ankle inversion      ?Ankle eversion      ?Grossly      ?(Blank rows = not tested) ?  ?  ?LOWER EXTREMITY SPECIAL TESTS:  ?Hip special tests: Luisa Hart (FABER) test: negative, Thomas test: positive , and Hip scouring test: negative ?  ?  FUNCTIONAL TESTS:  ?30 Second Sit to Stand: 17 reps ?  ?GAIT: ?Distance walked: 45ft ?Assistive device utilized: None ?Level of assistance: Complete Independence ?Comments: no overt gait deviation ?  ?TODAY'S TREATMENT: ?[facility] Adult PT Treatment:                                                DATE: 10/17/2021 ?Therapeutic Exercise: ?STS 3x10 15# KB - tap to table ?Lateral walk RTB 3x83ft ?Monster walk RTB 2x26ft ?S/L hip abd circles 2x10 each cw/ccw ?Bridge on pball 3x10 ?Single leg bridge 2x10 each ?Fig 4 stretch 2x45" ?Supine piriformis stretch 2x30" ?Leg press 3x10 75# ?Repeated lunge to foam pad 2x10 each ? ?[facility] Adult PT Treatment:                                                DATE: 10/09/2021 ?Therapeutic Exercise: ?NuStep lvl 7 UE/LE x 3 min while taking subjective ?Lateral walk RTB 3x60ft ?Bridge with clamshell 2x15 GTB ?Sidelying clamshell 2x15 GTB each ?STS 3x10 - 10lb KB ?Modified thomas stretch x 60" each ?Leg press x 10 55#, 2x10 65# ?Standing hip abd/ext 2x10 RTB ?DKTC x 30" ?Supine piriformis stretch 2x30" ?Supine figure 4 stretch 2x30" each ?DKTC pball roll x 10 - 5" hold ?Bridge on pball x 10  ? ?[facility] Adult PT Treatment:                                                DATE: 10/01/2021 ?Therapeutic Exercise: ?NuStep lvl 7 UE/LE x 2.3 min while taking subjective ?Bridge with clamshell 2x15 GTB ?Sidelying clamshell 2x10 GTB each ?Supine SLR 2x10 2.5# ?Sidelying hip abd 2x10 2.5# ?DKTC x 30" ?Supine piriformis stretch 2x30" ?Supine figure 4 stretch 2x30" each ? ?[facility] Adult PT Treatment:                                                 DATE: 09/17/2021 ?Therapeutic Exercise: ?Bridge x 10 ?Modified thomas stretch x 30" R ?Supine piriformis stretch x 30" R ?Supine figure 4 stretch x 30" each ?Self Care: ?Education on tennis

## 2021-10-21 DIAGNOSIS — M9902 Segmental and somatic dysfunction of thoracic region: Secondary | ICD-10-CM | POA: Diagnosis not present

## 2021-10-21 DIAGNOSIS — M9903 Segmental and somatic dysfunction of lumbar region: Secondary | ICD-10-CM | POA: Diagnosis not present

## 2021-10-21 DIAGNOSIS — M9905 Segmental and somatic dysfunction of pelvic region: Secondary | ICD-10-CM | POA: Diagnosis not present

## 2021-10-21 DIAGNOSIS — M5441 Lumbago with sciatica, right side: Secondary | ICD-10-CM | POA: Diagnosis not present

## 2021-10-22 DIAGNOSIS — G4730 Sleep apnea, unspecified: Secondary | ICD-10-CM | POA: Diagnosis not present

## 2021-10-22 DIAGNOSIS — H10413 Chronic giant papillary conjunctivitis, bilateral: Secondary | ICD-10-CM | POA: Diagnosis not present

## 2021-10-22 DIAGNOSIS — H43813 Vitreous degeneration, bilateral: Secondary | ICD-10-CM | POA: Diagnosis not present

## 2021-10-22 DIAGNOSIS — H04123 Dry eye syndrome of bilateral lacrimal glands: Secondary | ICD-10-CM | POA: Diagnosis not present

## 2021-10-22 DIAGNOSIS — H2513 Age-related nuclear cataract, bilateral: Secondary | ICD-10-CM | POA: Diagnosis not present

## 2021-10-24 ENCOUNTER — Ambulatory Visit: Payer: Medicare PPO | Attending: Family Medicine

## 2021-10-24 DIAGNOSIS — M25552 Pain in left hip: Secondary | ICD-10-CM | POA: Insufficient documentation

## 2021-10-24 DIAGNOSIS — M25551 Pain in right hip: Secondary | ICD-10-CM | POA: Insufficient documentation

## 2021-10-24 NOTE — Therapy (Signed)
?OUTPATIENT PHYSICAL THERAPY TREATMENT NOTE/DISCHARGE ? ?PHYSICAL THERAPY DISCHARGE SUMMARY ? ?Visits from Start of Care: 5 ? ?Current functional level related to goals / functional outcomes: ?See goals and objective ?  ?Remaining deficits: ?See goals and objective ?  ?Education / Equipment: ?HEP and discharge plan   ? ?Patient agrees to discharge. Patient goals were met. Patient is being discharged due to meeting the stated rehab goals. ? ? ?Patient Name: Henry Rogers ?MRN: [id number] ?DOB:1951/03/25, 72 y.o., male ?Today's Date: 10/24/2021 ? ?PCP: Denita Lung, MD ?REFERRING PROVIDER: Denita Lung, MD ? ? PT End of Session - 10/24/21 1047   ? ? Visit Number 5   ? Number of Visits 9   ? Date for PT Re-Evaluation 11/12/21   ? Authorization Type [employer] MCR   ? PT Start Time 1047   ? PT Stop Time 1125   ? PT Time Calculation (min) 38 min   ? Activity Tolerance Patient tolerated treatment well   ? Behavior During Therapy [facility] for tasks assessed/performed   ? ?  ?  ? ?  ? ? ? ? ? ?Past Medical History:  ?Diagnosis Date  ? BPH (benign prostatic hyperplasia)   ? CAD (coronary artery disease)   ? a. inf STEMI s/p BMS to mRCA (02/03/14)  b. inf STEMI s/p PCTA to RCA with slow reflow.  ? Chronic diastolic CHF (congestive heart failure) ([place])   ? a. 2D echo on 05/18/15 LVEF 55-60%, akinesis basal inferior wall, G2DD    ? GERD (gastroesophageal reflux disease)   ? Hearing loss   ? Hemorrhoids   ? HH (hiatus hernia)   ? Ischemic cardiomyopathy   ? a. ECHO 01/2014 EF 45-50% and akinesis of the entire inferior myocardium. Mild MR.   b. now resolved (EF 55-60% on 04/22/15)    ? Metabolic syndrome   ? Myocardial infarction [facility]) 2015  ? Heart attack in 2015 and 2017  ? OSA (obstructive sleep apnea)   ? a. mild not requiring CPAP at this time  ? PAF (paroxysmal atrial fibrillation) ([place])   ? a. brief episode after cardiac cath on 05/16/15- resolved after IV amiodarone.  ? Tinnitus   ? Varicose veins   ? ?Past Surgical History:   ?Procedure Laterality Date  ? CARDIAC CATHETERIZATION N/A 05/16/2015  ? Procedure: Left Heart Cath and Coronary Angiography;  Surgeon: Belva Crome, MD;  Location: [place] CV LAB;  Service: Cardiovascular;  Laterality: N/A;  ? CARDIAC CATHETERIZATION N/A 05/16/2015  ? Procedure: Coronary Stent Intervention;  Surgeon: Belva Crome, MD;  Location: [place] CV LAB;  Service: Cardiovascular;  Laterality: N/A;  ? CARPAL TUNNEL RELEASE Bilateral 03/02/2015  ? Procedure: BILATERAL CARPAL TUNNEL RELEASE ;  Surgeon: Daryll Brod, MD;  Location: [place];  Service: Orthopedics;  Laterality: Bilateral;  ? COLONOSCOPY  07/21/2004  ? Dr. Benson Norway  ? CORONARY ANGIOPLASTY WITH STENT PLACEMENT  02/02/2014  ? BMS to RCA  ? ENDOVENOUS ABLATION SAPHENOUS VEIN W/ LASER Right 10/12/2014  ? EVLA right greater saphenous vein by Curt Jews MD  ? ESOPHAGEAL DILATION  07/23/2021  ? Procedure: ESOPHAGEAL DILATION;  Surgeon: Lavena Bullion, DO;  Location: [place] OR;  Service: Endoscopy;;  ? ESOPHAGOGASTRODUODENOSCOPY N/A 07/23/2021  ? Procedure: ESOPHAGOGASTRODUODENOSCOPY (EGD);  Surgeon: Lavena Bullion, DO;  Location: [place];  Service: Endoscopy;  Laterality: N/A;  ? HIATAL HERNIA REPAIR N/A 07/23/2021  ? Procedure: LAPAROSCOPIC REPAIR OF HIATAL HERNIA, LAPOROSCOPIC  TRANSVERSE ABDOMINIS PLANE (  TAP) BLOCK ;  Surgeon: Greer Pickerel, MD;  Location: [place];  Service: General;  Laterality: N/A;  ? LEFT HEART CATHETERIZATION WITH CORONARY ANGIOGRAM N/A 02/02/2014  ? Procedure: LEFT HEART CATHETERIZATION WITH CORONARY ANGIOGRAM;  Surgeon: Peter M Martinique, MD;  Location: [facility] CATH LAB;  Service: Cardiovascular;  Laterality: N/A;  ? SKIN BIOPSY Left 06/19/2021  ? seborrheic keratosis  ? stab phlebectomy  Right 03/15/2015  ? stab phlebectomy > 20 incisions (right leg) by Curt Jews MD  ? TRANSORAL INCISIONLESS FUNDOPLICATION N/A 12/22/8935  ? Procedure: TRANSORAL INCISIONLESS FUNDOPLICATION;  Surgeon: Lavena Bullion, DO;  Location: [place];  Service: Endoscopy;  Laterality: N/A;  ? ?Patient Active Problem List  ? Diagnosis Date Noted  ? History of fundoplication   ? Hiatal hernia with GERD and esophagitis 07/23/2021  ? Gastroesophageal reflux disease with esophagitis without hemorrhage   ? Aortic atherosclerosis ([place]) 06/25/2021  ? Hiatal hernia 11/29/2020  ? Old inferior wall myocardial infarction 10/02/2016  ? Former smoker 01/11/2016  ? Chronic diastolic CHF (congestive heart failure) ([place])   ? PAF (paroxysmal atrial fibrillation) ([place])   ? CAD (coronary artery disease)   ? OSA (obstructive sleep apnea)   ? Varicose veins of leg with complications 34/28/7681  ? Hyperlipidemia LDL goal <70 02/22/2014  ? Metabolic syndrome 15/72/6203  ? Presbycusis of both ears   ? BPH (benign prostatic hyperplasia) 02/22/2013  ? Actinic keratosis 02/22/2013  ? ED (erectile dysfunction) 02/20/2012  ? Gastroesophageal reflux disease without esophagitis 02/20/2012  ? ? ?REFERRING DIAG:  ?M25.551,M25.552 (ICD-10-CM) - Bilateral hip pain ? ?THERAPY DIAG:  ?Pain in right hip ? ?Pain in left hip ? ?PERTINENT HISTORY:  ?Previous MI, CHF ? ?PRECAUTIONS:  ?None ? ?SUBJECTIVE:  ?Pt presents to PT with reports of muscle soreness after gardening all day the previous day. He has continued HEP compliance with no adverse effect. Pt is ready to begin PT at this time.  ? ?Pain: ?Are you having pain? Yes ?NPRS: 2/10 ?Pain Location: bilateral hips ?Pain Frequency/Description: intermittent  ?Aggravating factors: yard work, physical activity ?Relieving factors: rest ? ? ?OBJECTIVE:  ?  ?PATIENT SURVEYS:  ?FOTO 69% function; 74% predicted ?10/24/2021: 85% function ?  ?LE AROM/PROM: ?  ?AROM Right ?09/17/2021 Left ?09/17/2021 Right ?10/24/2021 Left  ?10/24/2021  ?Hip flexion         ?Hip extension        ?Hip abduction        ?Hip adduction        ?Hip internal rotation _0 ?Hip external rotation 35 33    ?Knee flexion        ?Knee extension        ?Ankle dorsiflexion        ?Ankle  plantarflexion        ?Ankle inversion        ?Ankle eversion        ?(Blank rows = not tested) ?  ?TODAY'S TREATMENT: ?[facility] Adult PT Treatment:                                                DATE: 10/24/2021 ?Therapeutic Exercise: ?NuStep lvl 5 UE/LE x 4 min while taking subjective ?Modified Thomas Stretch x 60" each ?Supine Piriformis Stretch with Leg Straight x 30" each ?Supine Figure 4 Piriformis Stretch x 30" each  ?  Supine Bridge with Resistance Band x 15 each BTB ?Clamshell with Resistance x 15 each BTB ?Sidelying Hip Abduction x 15 each ?Standard Lunge x 10 ?Side Stepping with Resistance at Ankles 3x81f RTB ?Forward Monster Walks 2x112fRTB ? ?[facility]                                                [facility] ?Therapeutic Exercise: ?STS 3x10 15# KB - tap to table ?Lateral walk RTB 3x1063fMonster walk RTB 2x10f60f/L hip abd circles 2x10 each cw/ccw ?Bridge on pball 3x10 ?Single leg bridge 2x10 each ?Fig 4 stretch 2x45" ?Supine piriformis stretch 2x30" ?Leg press 3x10 75# ?Repeated lunge to foam pad 2x10 each ? ?[facility]                                                DATE: 10/09/2021 ?Therapeutic Exercise: ?NuStep lvl 7 UE/LE x 3 min while taking subjective ?Lateral walk RTB 3x10ft37fidge with clamshell 2x15 GTB ?Sidelying clamshell 2x15 GTB each ?STS 3x10 - 10lb KB ?Modified thomas stretch x 60" each ?Leg press x 10 55#, 2x10 65# ?Standing hip abd/ext 2x10 RTB ?DKTC x 30" ?Supine piriformis stretch 2x30" ?Supine figure 4 stretch 2x30" each ?DKTC pball roll x 10 - 5" hold ?Bridge on pball x 10  ? ?PATIENT EDUCATION:  ?Education details: HEP and discharge plan ?Person educated: Patient ?Education method: Explanation, Demonstration, and Handouts ?Education comprehension: verbalized understanding and returned demonstration ?  ?  ?HOME EXERCISE PROGRAM: ?Access Code: [email or web address]: [email or web address] ?Date: 10/24/2021 ?Prepared by: DavidOctavio Mannsxercises ?-  Modified Thomas Stretch  - 1-2 x daily - 7 x weekly - 2 reps - 60 sec hold ?- Supine Piriformis Stretch with Leg Straight  - 1-2 x daily - 7 x weekly - 2 reps - 30 sec hold ?- Supine Figure 4 Piriformis Stret

## 2021-11-07 DIAGNOSIS — M9902 Segmental and somatic dysfunction of thoracic region: Secondary | ICD-10-CM | POA: Diagnosis not present

## 2021-11-07 DIAGNOSIS — M9903 Segmental and somatic dysfunction of lumbar region: Secondary | ICD-10-CM | POA: Diagnosis not present

## 2021-11-07 DIAGNOSIS — M5441 Lumbago with sciatica, right side: Secondary | ICD-10-CM | POA: Diagnosis not present

## 2021-11-07 DIAGNOSIS — M9905 Segmental and somatic dysfunction of pelvic region: Secondary | ICD-10-CM | POA: Diagnosis not present

## 2021-11-19 ENCOUNTER — Other Ambulatory Visit: Payer: Self-pay | Admitting: Family Medicine

## 2021-11-19 ENCOUNTER — Other Ambulatory Visit: Payer: Self-pay | Admitting: Cardiology

## 2021-11-19 DIAGNOSIS — I2510 Atherosclerotic heart disease of native coronary artery without angina pectoris: Secondary | ICD-10-CM

## 2021-11-20 DIAGNOSIS — M9903 Segmental and somatic dysfunction of lumbar region: Secondary | ICD-10-CM | POA: Diagnosis not present

## 2021-11-20 DIAGNOSIS — M9902 Segmental and somatic dysfunction of thoracic region: Secondary | ICD-10-CM | POA: Diagnosis not present

## 2021-11-20 DIAGNOSIS — M9905 Segmental and somatic dysfunction of pelvic region: Secondary | ICD-10-CM | POA: Diagnosis not present

## 2021-11-20 DIAGNOSIS — M5441 Lumbago with sciatica, right side: Secondary | ICD-10-CM | POA: Diagnosis not present

## 2021-11-22 ENCOUNTER — Other Ambulatory Visit: Payer: Self-pay | Admitting: Family Medicine

## 2021-11-27 ENCOUNTER — Ambulatory Visit: Payer: Medicare PPO | Admitting: Medical

## 2021-11-27 VITALS — BP 120/80 | HR 84 | Temp 97.2°F | Wt 182.4 lb

## 2021-11-27 DIAGNOSIS — J069 Acute upper respiratory infection, unspecified: Secondary | ICD-10-CM

## 2021-11-27 DIAGNOSIS — I868 Varicose veins of other specified sites: Secondary | ICD-10-CM

## 2021-11-27 DIAGNOSIS — Z79899 Other long term (current) drug therapy: Secondary | ICD-10-CM

## 2021-11-27 DIAGNOSIS — R58 Hemorrhage, not elsewhere classified: Secondary | ICD-10-CM | POA: Diagnosis not present

## 2021-11-27 NOTE — Progress Notes (Signed)
Subjective: ? Henry Rogers is a 71 y.o. male who presents for ?Chief Complaint  ?Patient presents with  ? bleeding from scrotum  ?  Bleeding from scrotum from a blood vessel. Took a shower and after shower noticed blood everywhere  ?   ?Here for concern of blood coming from the scrotum.  While he was in the shower this morning a few hours again he had immediate blood down his leg and on his scrotum.  He determined it was coming from superficially from the scrotum.  After using direct pressure he ended using Nuskin which ultimately got the bleeding.  He has not bled in the last hour or so. ? ?In general this is out of the blue.  He does not normally have bleeding.  He does bruise somewhat rarely due to him being on Brilinta. ? ?He has had a cold for the past few days, cough, congestion, mild sore throat.  No fever, no shortness of breath, no wheezing.  Using the mother counter DayQuil and NyQuil combo. ? ?No other aggravating or relieving factors.   ? ?No other c/o. ? ?The following portions of the patient's history were reviewed and updated as appropriate: allergies, current medications, past family history, past medical history, past social history, past surgical history and problem list. ? ?ROS ?Otherwise as in subjective above ? ?Objective: ?BP 120/80   Pulse 84   Temp (!) 97.2 ?F (36.2 ?C)   Wt 182 lb 6.4 oz (82.7 kg)   BMI 29.44 kg/m?  ? ?General appearance: alert, no distress, well developed, well nourished ?HEENT: normocephalic, sclerae anicteric, conjunctiva pink and moist, TMs pearly, nares patent, no discharge or erythema, pharynx normal ?Oral cavity: MMM, no lesions ?Neck: supple, no lymphadenopathy, no thyromegaly, no masses ?Lungs: CTA bilaterally, no wheezes, rhonchi, or rales ?Pulses: 2+ radial pulses, 2+ pedal pulses, normal cap refill ?Ext: no edema ?Several superficial varicose veins of the scrotum, there is a area of the right scrotum that has Nuskin applied to where it was bleeding.  No  current bleeding ?He also has varicose veins of the legs in general bilaterally ? ? ? ? ?Assessment: ?Encounter Diagnoses  ?Name Primary?  ? Bleeding Yes  ? High risk medication use   ? Upper respiratory tract infection, unspecified type   ? Spider varicose vein   ? ? ? ?Plan: ?It appears that he likely actually ruptured a superficial varicose vein of the scrotum.  He was able to get the bleeding to stop.  He is on high risk medication with Brilinta antiplatelet drug.  Fortunately he was able to get the bleeding to stop after about 45 minutes. ? ?Currently no active bleeding.  Advise he keep his fingernails trimmed, avoid unnecessary trauma to the scrotum due to the other varicose veins present.  Labs today just to make sure nothing urgent with his blood counts. ? ?URI-continue supportive measures he is already doing.  If worse over the next few days then call or recheck. ? ?Henry Rogers was seen today for bleeding from scrotum. ? ?Diagnoses and all orders for this visit: ? ?Bleeding ?-     CBC with Differential/Platelet ?-     PT and PTT ? ?High risk medication use ?-     CBC with Differential/Platelet ?-     PT and PTT ? ?Upper respiratory tract infection, unspecified type ? ?Spider varicose vein ? ? ? ?Follow up: pending labs ?

## 2021-11-28 ENCOUNTER — Encounter: Payer: Self-pay | Admitting: Family Medicine

## 2021-11-28 LAB — CBC WITH DIFFERENTIAL/PLATELET
Basophils Absolute: 0 10*3/uL (ref 0.0–0.2)
Basos: 0 %
EOS (ABSOLUTE): 0.2 10*3/uL (ref 0.0–0.4)
Eos: 4 %
Hematocrit: 44.9 % (ref 37.5–51.0)
Hemoglobin: 15.7 g/dL (ref 13.0–17.7)
Immature Grans (Abs): 0 10*3/uL (ref 0.0–0.1)
Immature Granulocytes: 0 %
Lymphocytes Absolute: 1.1 10*3/uL (ref 0.7–3.1)
Lymphs: 18 %
MCH: 32.8 pg (ref 26.6–33.0)
MCHC: 35 g/dL (ref 31.5–35.7)
MCV: 94 fL (ref 79–97)
Monocytes Absolute: 0.5 10*3/uL (ref 0.1–0.9)
Monocytes: 9 %
Neutrophils Absolute: 4 10*3/uL (ref 1.4–7.0)
Neutrophils: 69 %
Platelets: 149 10*3/uL — ABNORMAL LOW (ref 150–450)
RBC: 4.78 x10E6/uL (ref 4.14–5.80)
RDW: 12.8 % (ref 11.6–15.4)
WBC: 5.8 10*3/uL (ref 3.4–10.8)

## 2021-11-28 LAB — PT AND PTT
INR: 1.1 (ref 0.9–1.2)
Prothrombin Time: 11.1 s (ref 9.1–12.0)
aPTT: 32 s (ref 24–33)

## 2021-12-05 DIAGNOSIS — M9902 Segmental and somatic dysfunction of thoracic region: Secondary | ICD-10-CM | POA: Diagnosis not present

## 2021-12-05 DIAGNOSIS — M9905 Segmental and somatic dysfunction of pelvic region: Secondary | ICD-10-CM | POA: Diagnosis not present

## 2021-12-05 DIAGNOSIS — M5441 Lumbago with sciatica, right side: Secondary | ICD-10-CM | POA: Diagnosis not present

## 2021-12-05 DIAGNOSIS — M9903 Segmental and somatic dysfunction of lumbar region: Secondary | ICD-10-CM | POA: Diagnosis not present

## 2021-12-17 DIAGNOSIS — M5441 Lumbago with sciatica, right side: Secondary | ICD-10-CM | POA: Diagnosis not present

## 2021-12-17 DIAGNOSIS — M9902 Segmental and somatic dysfunction of thoracic region: Secondary | ICD-10-CM | POA: Diagnosis not present

## 2021-12-17 DIAGNOSIS — M9903 Segmental and somatic dysfunction of lumbar region: Secondary | ICD-10-CM | POA: Diagnosis not present

## 2021-12-17 DIAGNOSIS — M9905 Segmental and somatic dysfunction of pelvic region: Secondary | ICD-10-CM | POA: Diagnosis not present

## 2022-02-12 DIAGNOSIS — M5441 Lumbago with sciatica, right side: Secondary | ICD-10-CM | POA: Diagnosis not present

## 2022-02-12 DIAGNOSIS — M9905 Segmental and somatic dysfunction of pelvic region: Secondary | ICD-10-CM | POA: Diagnosis not present

## 2022-02-12 DIAGNOSIS — M9903 Segmental and somatic dysfunction of lumbar region: Secondary | ICD-10-CM | POA: Diagnosis not present

## 2022-02-12 DIAGNOSIS — M9902 Segmental and somatic dysfunction of thoracic region: Secondary | ICD-10-CM | POA: Diagnosis not present

## 2022-03-12 DIAGNOSIS — M9903 Segmental and somatic dysfunction of lumbar region: Secondary | ICD-10-CM | POA: Diagnosis not present

## 2022-03-12 DIAGNOSIS — M5441 Lumbago with sciatica, right side: Secondary | ICD-10-CM | POA: Diagnosis not present

## 2022-03-12 DIAGNOSIS — M9902 Segmental and somatic dysfunction of thoracic region: Secondary | ICD-10-CM | POA: Diagnosis not present

## 2022-03-12 DIAGNOSIS — M9905 Segmental and somatic dysfunction of pelvic region: Secondary | ICD-10-CM | POA: Diagnosis not present

## 2022-03-26 ENCOUNTER — Telehealth: Payer: Self-pay | Admitting: Family Medicine

## 2022-03-26 ENCOUNTER — Encounter: Payer: Self-pay | Admitting: Internal Medicine

## 2022-03-26 DIAGNOSIS — I2510 Atherosclerotic heart disease of native coronary artery without angina pectoris: Secondary | ICD-10-CM

## 2022-03-26 DIAGNOSIS — I700 Atherosclerosis of aorta: Secondary | ICD-10-CM

## 2022-03-26 NOTE — Telephone Encounter (Signed)
error 

## 2022-03-26 NOTE — Telephone Encounter (Signed)
Pt called in and says he will be going out of the country October first and wants to know if you can call him in Malaria vaccination Tablets to [facility] on [address].

## 2022-03-26 NOTE — Telephone Encounter (Signed)
Pt called again and is asking if he can come in for blood work that is done during a physical some time before he sees his cardiologist in November.

## 2022-03-31 ENCOUNTER — Encounter: Payer: Self-pay | Admitting: Family Medicine

## 2022-03-31 ENCOUNTER — Telehealth: Payer: Medicare PPO | Admitting: Family Medicine

## 2022-03-31 VITALS — Ht 66.0 in | Wt 182.0 lb

## 2022-03-31 DIAGNOSIS — Z298 Encounter for other specified prophylactic measures: Secondary | ICD-10-CM | POA: Diagnosis not present

## 2022-03-31 DIAGNOSIS — Z7184 Encounter for health counseling related to travel: Secondary | ICD-10-CM | POA: Diagnosis not present

## 2022-03-31 MED ORDER — AZITHROMYCIN 500 MG PO TABS: ORAL_TABLET | ORAL | 0 refills | Status: DC

## 2022-03-31 MED ORDER — ATOVAQUONE-PROGUANIL HCL 250-100 MG PO TABS: ORAL_TABLET | ORAL | 0 refills | Status: DC

## 2022-03-31 NOTE — Progress Notes (Signed)
   Subjective:    Patient ID: Henry Rogers, male    DOB: [birth year], 71 y.o.   MRN: [id number]  HPI Documentation for virtual audio and video telecommunications through Caregility encounter: The patient was located at home. 2 patient identifiers used.  The provider was located in the office. The patient did consent to this visit and is aware of possible charges through their insurance for this visit. The other persons participating in this telemedicine service were none. Time spent on call was and in review of previous records > total for counseling and coordination of care. This virtual service is not related to other E/M service within previous 7 days.  He and his wife are getting ready to go on a trip to [place].  It would be gone for 2 weeks.  They have updated their immunizations for the trip except for malaria.  They did this through the health department.  Review of Systems     Objective:   Physical Exam Alert and in no distress otherwise not examined       Assessment & Plan:  Counseling for travel - Plan: azithromycin (ZITHROMAX) 500 MG tablet  Need for malaria prophylaxis - Plan: atovaquone-proguanil (MALARONE) 250-100 MG TABS tablet Discussed travel in terms of prevention.  Sunscreens, insect repellent, appropriate medications to take, water consumption and food consumption while traveling.

## 2022-04-02 ENCOUNTER — Encounter: Payer: Self-pay | Admitting: Gastroenterology

## 2022-04-02 NOTE — Telephone Encounter (Signed)
Virtual appointment would be perfectly reasonable for him.  Can probably try to get a sooner appointment if any cancellations.  Very happy to hear he is still doing so well after surgery and that the cTIF has made such a meaningful difference for him!  With regards to his questions, yes, can sometimes see the symptoms which could be "normal" physiologic reflux, and do not necessarily mean anything has changed with his surgical wrap.  He is allowed to use antacids, PPI, H2 blockers prn for any breakthrough symptoms.  After TIF, wrap is not overtightened, and therefore he is still able to vomit if he has gastroenteritis, etc.  Similarly, still able to belch and offload pressure.

## 2022-04-09 DIAGNOSIS — M9903 Segmental and somatic dysfunction of lumbar region: Secondary | ICD-10-CM | POA: Diagnosis not present

## 2022-04-09 DIAGNOSIS — M9905 Segmental and somatic dysfunction of pelvic region: Secondary | ICD-10-CM | POA: Diagnosis not present

## 2022-04-09 DIAGNOSIS — M5441 Lumbago with sciatica, right side: Secondary | ICD-10-CM | POA: Diagnosis not present

## 2022-04-09 DIAGNOSIS — M9902 Segmental and somatic dysfunction of thoracic region: Secondary | ICD-10-CM | POA: Diagnosis not present

## 2022-04-11 ENCOUNTER — Encounter: Payer: Self-pay | Admitting: Family Medicine

## 2022-04-15 ENCOUNTER — Telehealth: Payer: Self-pay | Admitting: Licensed Clinical Social Worker

## 2022-04-15 NOTE — Patient Outreach (Signed)
  Care Coordination   04/15/2022 Name: Henry Rogers MRN: [id number] DOB: 10-12-1950   Care Coordination Outreach Attempts:  An unsuccessful telephone outreach was attempted today to offer the patient information about available care coordination services as a benefit of their health plan.   Follow Up Plan:  Additional outreach attempts will be made to offer the patient care coordination information and services.   Encounter Outcome:  No Answer  Care Coordination Interventions Activated:  No   Care Coordination Interventions:  No, not indicated    Christa See, MSW, [email or web address] Phone [phone] 4:26 PM

## 2022-05-08 DIAGNOSIS — M9902 Segmental and somatic dysfunction of thoracic region: Secondary | ICD-10-CM | POA: Diagnosis not present

## 2022-05-08 DIAGNOSIS — M9905 Segmental and somatic dysfunction of pelvic region: Secondary | ICD-10-CM | POA: Diagnosis not present

## 2022-05-08 DIAGNOSIS — M5441 Lumbago with sciatica, right side: Secondary | ICD-10-CM | POA: Diagnosis not present

## 2022-05-08 DIAGNOSIS — M9903 Segmental and somatic dysfunction of lumbar region: Secondary | ICD-10-CM | POA: Diagnosis not present

## 2022-05-12 ENCOUNTER — Encounter: Payer: Self-pay | Admitting: Internal Medicine

## 2022-05-17 ENCOUNTER — Encounter: Payer: Self-pay | Admitting: Family Medicine

## 2022-05-17 DIAGNOSIS — Z87891 Personal history of nicotine dependence: Secondary | ICD-10-CM

## 2022-05-21 ENCOUNTER — Other Ambulatory Visit: Payer: Medicare PPO

## 2022-05-21 DIAGNOSIS — I700 Atherosclerosis of aorta: Secondary | ICD-10-CM

## 2022-05-21 DIAGNOSIS — Z79899 Other long term (current) drug therapy: Secondary | ICD-10-CM | POA: Diagnosis not present

## 2022-05-21 DIAGNOSIS — I2510 Atherosclerotic heart disease of native coronary artery without angina pectoris: Secondary | ICD-10-CM | POA: Diagnosis not present

## 2022-05-21 LAB — COMPREHENSIVE METABOLIC PANEL
ALT: 30 IU/L (ref 0–44)
AST: 29 IU/L (ref 0–40)
Albumin/Globulin Ratio: 2.1 (ref 1.2–2.2)
Albumin: 4.5 g/dL (ref 3.8–4.8)
Alkaline Phosphatase: 73 IU/L (ref 44–121)
BUN/Creatinine Ratio: 13 (ref 10–24)
BUN: 15 mg/dL (ref 8–27)
Bilirubin Total: 0.7 mg/dL (ref 0.0–1.2)
CO2: 25 mmol/L (ref 20–29)
Calcium: 9.7 mg/dL (ref 8.6–10.2)
Chloride: 102 mmol/L (ref 96–106)
Creatinine, Ser: 1.17 mg/dL (ref 0.76–1.27)
Globulin, Total: 2.1 g/dL (ref 1.5–4.5)
Glucose: 110 mg/dL — ABNORMAL HIGH (ref 70–99)
Potassium: 4.9 mmol/L (ref 3.5–5.2)
Sodium: 139 mmol/L (ref 134–144)
Total Protein: 6.6 g/dL (ref 6.0–8.5)
eGFR: 66 mL/min/{1.73_m2} (ref >59)

## 2022-05-21 LAB — CBC WITH DIFFERENTIAL/PLATELET
Basophils Absolute: 0 10*3/uL (ref 0.0–0.2)
Basos: 0 %
EOS (ABSOLUTE): 0.1 10*3/uL (ref 0.0–0.4)
Eos: 2 %
Hematocrit: 44.8 % (ref 37.5–51.0)
Hemoglobin: 15.6 g/dL (ref 13.0–17.7)
Immature Grans (Abs): 0 10*3/uL (ref 0.0–0.1)
Immature Granulocytes: 0 %
Lymphocytes Absolute: 2.1 10*3/uL (ref 0.7–3.1)
Lymphs: 42 %
MCH: 32.6 pg (ref 26.6–33.0)
MCHC: 34.8 g/dL (ref 31.5–35.7)
MCV: 94 fL (ref 79–97)
Monocytes Absolute: 0.5 10*3/uL (ref 0.1–0.9)
Monocytes: 9 %
Neutrophils Absolute: 2.4 10*3/uL (ref 1.4–7.0)
Neutrophils: 47 %
Platelets: 197 10*3/uL (ref 150–450)
RBC: 4.79 x10E6/uL (ref 4.14–5.80)
RDW: 12.1 % (ref 11.6–15.4)
WBC: 5.1 10*3/uL (ref 3.4–10.8)

## 2022-05-21 LAB — LIPID PANEL
Chol/HDL Ratio: 2.8 ratio (ref 0.0–5.0)
Cholesterol, Total: 121 mg/dL (ref 100–199)
HDL: 44 mg/dL (ref >39)
LDL Chol Calc (NIH): 62 mg/dL (ref 0–99)
Triglycerides: 74 mg/dL (ref 0–149)
VLDL Cholesterol Cal: 15 mg/dL (ref 5–40)

## 2022-05-22 ENCOUNTER — Telehealth: Payer: Medicare PPO | Admitting: Gastroenterology

## 2022-05-22 VITALS — Ht 66.0 in | Wt 185.0 lb

## 2022-05-22 DIAGNOSIS — R12 Heartburn: Secondary | ICD-10-CM

## 2022-05-22 DIAGNOSIS — Z9889 Other specified postprocedural states: Secondary | ICD-10-CM

## 2022-05-22 NOTE — Patient Instructions (Signed)
Follow up as needed.  If you are age 73 or older, your body mass index should be between 23-30. Your Body mass index is 29.86 kg/m. If this is out of the aforementioned range listed, please consider follow up with your Primary Care Provider.  If you are age 28 or younger, your body mass index should be between 19-25. Your Body mass index is 29.86 kg/m. If this is out of the aformentioned range listed, please consider follow up with your Primary Care Provider.   __________________________________________________________  The [facility] providers would like to encourage you to use [facility] to communicate with providers for non-urgent requests or questions.  Due to long hold times on the telephone, sending your provider a message by [facility] may be a faster and more efficient way to get a response.  Please allow 48 business hours for a response.  Please remember that this is for non-urgent requests.   Due to recent changes in healthcare laws, you may see the results of your imaging and laboratory studies on MyChart before your provider has had a chance to review them.  We understand that in some cases there may be results that are confusing or concerning to you. Not all laboratory results come back in the same time frame and the provider may be waiting for multiple results in order to interpret others.  Please give [place] 48 hours in order for your provider to thoroughly review all the results before contacting the office for clarification of your results.     Thank you for choosing me and [facility].  Vito Cirigliano, D.O.

## 2022-05-22 NOTE — Progress Notes (Signed)
Chief Complaint: Heartburn   GI Hx: Kamauri Denardo is a 71 year old male with a history of CAD  s/p STEMI 2015 with RCA stent (on Brilinta, ASA), CHF (EF 55-60% in 2016), ischemic cardiomyopathy, OSA, paroxysmal A. fib (no anticoagulation), BPH, initially seen by me in 03/2021 for evaluation of GERD, hiatal hernia, and antireflux surgical options.  Please see note dated 04/09/2021 for full reflux history.  GERD history: -Index symptoms: Waterbrash, heartburn, regurgitation, dyspepsia, nocturnal regurgitation, dry cough. No dysphagia -Exacerbating features: Regurgitation with forward flexion,  -Medications trialed: Zantac, Prilosec, Pepcid -Current medications: Prilosec 40 mg QAM, Pepcid 20 mg qhs. Sleeps with HOB elevated -Complications: Hiatal hernia   GERD evaluation: -Last EGD: 07/2021 -Barium esophagram: 02/2021: Normal motility, small HH.  Severe gastroesophageal reflux to the level of thoracic inlet -Esophageal Manometry: None -pH/Impedance: None -Bravo: None   Endoscopic History: - EGD (06/2018): Irregular but nonnodular Z-line - EGD (02/2021): 2-3 cm HH, Hill grade 3 valve - cTIF (07/23/2021):LA Grade A esophagitis.  30 Serofuse fasteners placed.     -  Colonoscopy Dr. Benson Norway 03/2015 for screening: findings 80mm ascending colon polyp, sigmoid diverticulosis. Polyp was "unremarkable mucosa" on path, not precancerous polyp.  He needs recall colonoscopy 03/2025    HPI:    Due to current restrictions/limitations of in-office visits due to the COVID-19 pandemic, this scheduled clinical appointment was converted to a telehealth virtual consultation using MyChart video  -Time of medical discussion: 15 minutes -The patient did consent to this virtual visit and is aware of possible charges through their insurance for this visit.  -Names of all parties present: Lenn Cal (patient), Gerrit Heck, DO, [facility] (physician) -Patient location: Home -Physician location:  Office  TANISH PRIEN is a 71 y.o. male referred to the Gastroenterology Clinic for follow-up.  Last seen by me on 08/15/2021 following cTIF on 07/23/2021.  Was doing very well at that time and has since titrated off all scheduled acid suppression therapy.  Has felt that the surgery was "life-changing".  Back to sleeping flat at night.  Tolerating p.o. intake without issue.  However, has noticed episodic heartburn.  Symptoms tend to occur in the afternoon, and resolve with walking the dog or with dinner.  Has used Prilosec 20 mg prn infrequently with improvement.  When the symptoms start, it is anxiety provoking for him as he is fearful that he is having return of index reflux, which in turn increases his heartburn, which then provokes more anxiety.  No dysphagia.  Past medical history, past surgical history, social history, family history, medications, and allergies reviewed in the chart and with patient.    Past Medical History:  Diagnosis Date   BPH (benign prostatic hyperplasia)    CAD (coronary artery disease)    a. inf STEMI s/p BMS to mRCA (02/03/14)  b. inf STEMI s/p PCTA to RCA with slow reflow.   Chronic diastolic CHF (congestive heart failure) ([place])    a. 2D echo on 05/18/15 LVEF 55-60%, akinesis basal inferior wall, G2DD     GERD (gastroesophageal reflux disease)    Hearing loss    Hemorrhoids    HH (hiatus hernia)    Ischemic cardiomyopathy    a. ECHO 01/2014 EF 45-50% and akinesis of the entire inferior myocardium. Mild MR.   b. now resolved (EF 55-60% on 03/23/80)     Metabolic syndrome    Myocardial infarction ([place]) 2015   Heart attack  in 2015 and 2017   OSA (obstructive sleep apnea)    a. mild not requiring CPAP at this time   PAF (paroxysmal atrial fibrillation) (H[facility])    a. brief episode after cardiac cath on 05/16/15- resolved after IV amiodarone.   Tinnitus    Varicose veins      Past Surgical History:  Procedure Laterality Date   CARDIAC CATHETERIZATION N/A  05/16/2015   Procedure: Left Heart Cath and Coronary Angiography;  Surgeon: Lyn Records, MD;  Location: [facility] INVASIVE CV LAB;  Service: Cardiovascular;  Laterality: N/A;   CARDIAC CATHETERIZATION N/A 05/16/2015   Procedure: Coronary Stent Intervention;  Surgeon: Lyn Records, MD;  Location: [facility] INVASIVE CV LAB;  Service: Cardiovascular;  Laterality: N/A;   CARPAL TUNNEL RELEASE Bilateral 03/02/2015   Procedure: BILATERAL CARPAL TUNNEL RELEASE ;  Surgeon: Cindee Salt, MD;  Location: [facility];  Service: Orthopedics;  Laterality: Bilateral;   COLONOSCOPY  07/21/2004   Dr. Elnoria Howard   CORONARY ANGIOPLASTY WITH STENT PLACEMENT  02/02/2014   BMS to RCA   ENDOVENOUS ABLATION SAPHENOUS VEIN W/ LASER Right 10/12/2014   EVLA right greater saphenous vein by Gretta Began MD   ESOPHAGEAL DILATION  07/23/2021   Procedure: ESOPHAGEAL DILATION;  Surgeon: Shellia Cleverly, DO;  Location: [facility] OR;  Service: Endoscopy;;   ESOPHAGOGASTRODUODENOSCOPY N/A 07/23/2021   Procedure: ESOPHAGOGASTRODUODENOSCOPY (EGD);  Surgeon: Shellia Cleverly, DO;  Location: [facility] OR;  Service: Endoscopy;  Laterality: N/A;   HIATAL HERNIA REPAIR N/A 07/23/2021   Procedure: LAPAROSCOPIC REPAIR OF HIATAL HERNIA, LAPOROSCOPIC  TRANSVERSE ABDOMINIS PLANE (TAP) BLOCK ;  Surgeon: Gaynelle Adu, MD;  [facility] OR;  Service: General;  Laterality: N/A;   LEFT HEART CATHETERIZATION WITH CORONARY ANGIOGRAM N/A 02/02/2014   Procedure: LEFT HEART CATHETERIZATION WITH CORONARY ANGIOGRAM;  Surgeon: Peter M Swazil[place], MD;  Location: [facility] CATH LAB;  Service: Cardiovascular;  Laterality: N/A;   SKIN BIOPSY Left 06/19/2021   seborrheic keratosis   stab phlebectomy  Right 03/15/2015   stab phlebectomy > 20 incisions (right leg) by Gretta Began MD   TRANSORAL INCISIONLESS FUNDOPLICATION N/A 07/23/2021   Procedure: TRANSORAL INCISIONLESS FUNDOPLICATION;  Surgeon: Shellia Cleverly, DO;  [facility]: [facility] OR;  Service: Endoscopy;  Laterality: N/A;   Family  History  Problem Relation Age of Onset   Hypertension Mother    Cervical cancer Mother    Breast cancer Mother    Skin cancer Mother    Pancreatic cancer Brother    Heart disease Brother    Colon cancer Neg Hx    Esophageal cancer Neg Hx    Social History   Tobacco Use   Smoking status: Former   Smokeless tobacco: Never  Building services engineer Use: Never used  Substance Use Topics   Alcohol use: Yes    Alcohol/week: 3.0 standard drinks of alcohol    Types: 3 Glasses of wine per week   Drug use: No   Current Outpatient Medications  Medication Sig Dispense Refill   aspirin 81 MG tablet Take 81 mg by mouth daily.     atorvastatin (LIPITOR) 80 MG tablet TAKE 1 TABLET(80 MG) BY MOUTH DAILY AT 6 PM 90 tablet 3   Melatonin 10 MG CAPS Take 10 mg by mouth at bedtime.     metoprolol tartrate (LOPRESSOR) 25 MG tablet TAKE 1 TABLET(25 MG) BY MOUTH TWICE DAILY 180 tablet 1   Multiple Vitamin (MULTIVITAMIN) tablet Take 1 tablet by mouth every evening. 50 Plus  nitroGLYCERIN (NITROSTAT) 0.4 MG SL tablet Place 1 tablet (0.4 mg total) under the tongue every 5 (five) minutes as needed for chest pain. MAX 3 doses (Patient not taking: Reported on 05/22/2022) 25 tablet 7   Nutritional Supplements (JUICE PLUS FIBRE PO) Take 1 tablet by mouth 2 (two) times daily. Fruit blend and vegetable blend     omega-3 acid ethyl esters (LOVAZA) 1 g capsule TAKE 2 CAPSULES BY MOUTH TWICE DAILY 360 capsule 3   tadalafil (CIALIS) 5 MG tablet Take 1 tablet (5 mg total) by mouth daily. 90 tablet 3   ticagrelor (BRILINTA) 60 MG TABS tablet TAKE 1 TABLET(60 MG) BY MOUTH TWICE DAILY 180 tablet 3   zolpidem (AMBIEN) 5 MG tablet TAKE 1 TABLET BY MOUTH AT BEDTIME AS NEEDED FOR SLEEP 30 tablet 0   No current facility-administered medications for this visit.   Allergies  Allergen Reactions   Ace Inhibitors Other (See Comments)    No personal hx reaction, but 2 family members have had persistent cough with the class and he  prefers not to take ACEi.     Review of Systems: All systems reviewed and negative except where noted in HPI.     Physical Exam:    Complete physical exam not completed due to the nature of this telehealth communication.   Gen: Awake, alert, and oriented, and well communicative. HEENT: EOMI, non-icteric sclera, NCAT, MMM Neck: Normal movement of head and neck Pulm: No labored breathing, speaking in full sentences without conversational dyspnea Derm: No apparent lesions or bruising in visible field MS: Moves all visible extremities without noticeable abnormality Psych: Pleasant, cooperative, normal speech, thought processing seemingly intact   ASSESSMENT AND PLAN;   1) GERD s/p TIF 2) History of hiatal hernia s/p laparoscopic HHR and fundoplication 3) Heartburn Has been doing very well postoperatively after cTIF in 07/2021.  Was able to titrate off acid suppression therapy completely, back to sleeping through the night and flat.  More recently with some episodic heartburn in the afternoons, which has become more frequent.  This has been associated anxiety which in turn increases his heartburn and subsequently more anxiety.  We discussed possibility of physiologic reflux with TLESRs and subsequent anxiety associated esophageal symptoms.  Discussed pathologic reflux in the postoperative setting.  Plan for the following: - Okay to use Prilosec 20 mg prn.  Alternatively recommended picking up OTC Pepcid and/or Tums for breakthrough - If symptoms become more progressive, discussed barium esophagram.  Could also consider EGD with Bravo placement if symptoms more suggestive of true breakthrough refluxate and/or valve breakdown   RTC prn    Shellia Cleverly, DO, FACG  05/22/2022, 11:48 AM   Ronnald Nian, MD

## 2022-05-23 LAB — HGB A1C W/O EAG: Hgb A1c MFr Bld: 5.9 % — ABNORMAL HIGH (ref 4.8–5.6)

## 2022-05-23 LAB — SPECIMEN STATUS REPORT

## 2022-05-27 NOTE — Progress Notes (Signed)
Cardiology Office Note:    Date:  06/05/2022   ID:  Henry Rogers, DOB March 24, 1951, MRN [id number]  PCP:  Denita Lung, MD  Cardiologist:  Deloras Reichard [place], MD  Electrophysiologist:  None   Referring MD: Denita Lung, MD   Chief Complaint  Patient presents with   Coronary Artery Disease     History of Present Illness:    Henry Rogers is a 71 y.o. male with a hx of HLD, OSA, chronic diastolic heart failure and CAD.  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  He also had a cardiogenic shock.  RCA was stented with a 5 x 16 mm Veriflex stent postdilated to 6 mm.  EF was 45 to 50%.  The other coronary vessels did not have significant disease.  Repeat echocardiogram in September 2015 showed LV function was normalized to 55 to 60%.  He was readmitted in October 2016 with recurrent inferior infarction again associated with cardiogenic shock.  The stent in the mid RCA was patent however distal RCA was occluded was heavy thrombus.  This was treated with balloon angioplasty.  He had a transient episode of paroxysmal atrial fibrillation after the cardiac catheterization, this was resolved after IV amiodarone and has not recurred since.  He is not on any systemic anticoagulation due to the brief episodes without recurrence. He is on chronic DAPT.  He did undergo laproscopic repair of hiatal hernia on 07/23/21 by Dr Redmond Pulling. He notes infrequent heartburn/indigestion. No chest pain or dyspnea. Stays active. No other health concerns. No bleeding. No palpitations or dizziness.       Past Medical History:  Diagnosis Date   BPH (benign prostatic hyperplasia)    CAD (coronary artery disease)    a. inf STEMI s/p BMS to mRCA (02/03/14)  b. inf STEMI s/p PCTA to RCA with slow reflow.   Chronic diastolic CHF (congestive heart failure) ([place])    a. 2D echo on 05/18/15 LVEF 55-60%, akinesis basal inferior wall, G2DD     GERD  (gastroesophageal reflux disease)    Hearing loss    Hemorrhoids    HH (hiatus hernia)    Ischemic cardiomyopathy    a. ECHO 01/2014 EF 45-50% and akinesis of the entire inferior myocardium. Mild MR.   b. now resolved (EF 55-60% on 12/22/99)     Metabolic syndrome    Myocardial infarction ([place]) 2015   Heart attack in 2015 and 2017   OSA (obstructive sleep apnea)    a. mild not requiring CPAP at this time   PAF (paroxysmal atrial fibrillation) ([place])    a. brief episode after cardiac cath on 05/16/15- resolved after IV amiodarone.   Tinnitus    Varicose veins     Past Surgical History:  Procedure Laterality Date   CARDIAC CATHETERIZATION N/A 05/16/2015   Procedure: Left Heart Cath and Coronary Angiography;  Surgeon: Belva Crome, MD;  Location: [place] CV LAB;  Service: Cardiovascular;  Laterality: N/A;   CARDIAC CATHETERIZATION N/A 05/16/2015   Procedure: Coronary Stent Intervention;  Surgeon: Belva Crome, MD;  Location: [place] CV LAB;  Service: Cardiovascular;  Laterality: N/A;   CARPAL TUNNEL RELEASE Bilateral 03/02/2015   Procedure: BILATERAL CARPAL TUNNEL RELEASE ;  Surgeon: Daryll Brod, MD;  Location: [place];  Service: Orthopedics;  Laterality: Bilateral;   COLONOSCOPY  07/21/2004   Dr. Benson Norway   CORONARY ANGIOPLASTY WITH STENT PLACEMENT  02/02/2014  BMS to RCA   ENDOVENOUS ABLATION SAPHENOUS VEIN W/ LASER Right 10/12/2014   EVLA right greater saphenous vein by Gretta Began MD   ESOPHAGEAL DILATION  07/23/2021   Procedure: ESOPHAGEAL DILATION;  Surgeon: Shellia Cleverly, DO;  Location: [facility] OR;  Service: Endoscopy;;   ESOPHAGOGASTRODUODENOSCOPY N/A 07/23/2021   Procedure: ESOPHAGOGASTRODUODENOSCOPY (EGD);  Surgeon: Shellia Cleverly, DO;  Location: [facility] OR;  Service: Endoscopy;  Laterality: N/A;   HIATAL HERNIA REPAIR N/A 07/23/2021   Procedure: LAPAROSCOPIC REPAIR OF HIATAL HERNIA, LAPOROSCOPIC  TRANSVERSE ABDOMINIS PLANE (TAP) BLOCK ;  Surgeon: Gaynelle Adu,  MD;  Location: [facility] OR;  Service: General;  Laterality: N/A;   LEFT HEART CATHETERIZATION WITH CORONARY ANGIOGRAM N/A 02/02/2014   Procedure: LEFT HEART CATHETERIZATION WITH CORONARY ANGIOGRAM;  Surgeon: Meara Wiechman M [place]azil[place], MD;  Location: [facility] CATH LAB;  Service: Cardiovascular;  Laterality: N/A;   SKIN BIOPSY Left 06/19/2021   seborrheic keratosis   stab phlebectomy  Right 03/15/2015   stab phlebectomy > 20 incisions (right leg) by Gretta Began MD   TRANSORAL INCISIONLESS FUNDOPLICATION N/A 07/23/2021   Procedure: TRANSORAL INCISIONLESS FUNDOPLICATION;  Surgeon: Shellia Cleverly, DO;  Location: [facility] OR;  Service: Endoscopy;  Laterality: N/A;    Current Medications: Current Meds  Medication Sig   aspirin 81 MG tablet Take 81 mg by mouth daily.   atorvastatin (LIPITOR) 80 MG tablet TAKE 1 TABLET(80 MG) BY MOUTH DAILY AT 6 PM   Melatonin 10 MG CAPS Take 10 mg by mouth at bedtime.   metoprolol tartrate (LOPRESSOR) 25 MG tablet TAKE 1 TABLET(25 MG) BY MOUTH TWICE DAILY   Multiple Vitamin (MULTIVITAMIN) tablet Take 1 tablet by mouth every evening. 50 Plus   nitroGLYCERIN (NITROSTAT) 0.4 MG SL tablet Place 1 tablet (0.4 mg total) under the tongue every 5 (five) minutes as needed for chest pain. MAX 3 doses   Nutritional Supplements (JUICE PLUS FIBRE PO) Take 1 tablet by mouth 2 (two) times daily. Fruit blend and vegetable blend   omega-3 acid ethyl esters (LOVAZA) 1 g capsule TAKE 2 CAPSULES BY MOUTH TWICE DAILY   tadalafil (CIALIS) 5 MG tablet Take 1 tablet (5 mg total) by mouth daily.   ticagrelor (BRILINTA) 60 MG TABS tablet TAKE 1 TABLET(60 MG) BY MOUTH TWICE DAILY   zolpidem (AMBIEN) 5 MG tablet TAKE 1 TABLET BY MOUTH AT BEDTIME AS NEEDED FOR SLEEP     Allergies:   Ace inhibitors   Social History   Socioeconomic History   Marital status: Married    Spouse name: Not on file   Number of children: Not on file   Years of education: Not on file   Highest education level: Not on file  Occupational  History   Not on file  Tobacco Use   Smoking status: Former   Smokeless tobacco: Never  Vaping Use   Vaping Use: Never used  Substance and Sexual Activity   Alcohol use: Yes    Alcohol/week: 3.0 standard drinks of alcohol    Types: 3 Glasses of wine per week   Drug use: No   Sexual activity: Not Currently  Other Topics Concern   Not on file  Social History Narrative   Not on file   Social Determinants of Health   Financial Resource Strain: Low Risk  (09/27/2021)   Overall Financial Resource Strain (CARDIA)    Difficulty of Paying Living Expenses: Not hard at all  Food Insecurity: No Food Insecurity (09/27/2021)   Hunger Vital Sign  Worried About Programme researcher, broadcasting/film/video in the Last Year: Never true    Ran Out of Food in the Last Year: Never true  Transportation Needs: No Transportation Needs (09/27/2021)   PRAPARE - Administrator, Civil Service (Medical): No    Lack of Transportation (Non-Medical): No  Physical Activity: Sufficiently Active (09/27/2021)   Exercise Vital Sign    Days of Exercise per Week: 7 days    Minutes of Exercise per Session: 30 min  Stress: No Stress Concern Present (09/27/2021)   [employer] of Occupational Health - Occupational Stress Questionnaire    Feeling of Stress : Only a little  Social Connections: Not on file     Family History: The patient's family history includes Breast cancer in his mother; Cervical cancer in his mother; Heart disease in his brother; Hypertension in his mother; Pancreatic cancer in his brother; Skin cancer in his mother. There is no history of Colon cancer or Esophageal cancer.  ROS:   Please see the history of present illness.     All other systems reviewed and are negative.  EKGs/Labs/Other Studies Reviewed:    The following studies were reviewed today:  Cath 05/16/2015 Mid RCA to Dist RCA lesion, 10% stenosed. The lesion was previously treated with a stent (unknown type). Prox RCA to Mid RCA  lesion, 25% stenosed. Prox LAD to Dist LAD lesion, 40% stenosed. Dist RCA lesion, 100% stenosed. Post intervention, there is a 10% residual stenosis.   Acute inferior myocardial infarction presenting with cardiogenic shock. PTCA of the distal RCA from 100% to less than 10%. The vessel is large and ectatic containing heavy thrombus. Reperfusion was complicated by severe "no reflow". Widely patent left coronary system. Intra-aortic balloon pump to improve diastolic flow and help resolve "no reflow". Left ventriculography was not performed     RECOMMENDATIONS:   One-to-one intra-aortic balloon, pulsation for at least 24 hours. Intravenous Levophed to keep a mean arterial pressure greater than 65 mmHg. Intravenous Aggrastat for at least 24 hours Aspirin and Brilinta indefinitely. IV amiodarone to treat acute atrial fibrillation. Prognosis is guarded. This was discussed with the patient's family in full detail. IV heparin without a bolus to start in 8 hours. Beta blocker therapy as tolerated by blood pressure. 2-D Doppler echocardiogram 24-48 hours hence. Unfortunately, the patient will probably have a rocky post infarct course with potential for brady- and ventricular arrhythmias.  EKG:  EKG is ordered today.  NSR rate 67. Old inferior infarct. I have personally reviewed and interpreted this study.     Recent Labs: 05/21/2022: ALT 30; BUN 15; Creatinine, Ser 1.17; Hemoglobin 15.6; Platelets 197; Potassium 4.9; Sodium 139  Recent Lipid Panel    Component Value Date/Time   CHOL 121 05/21/2022 1015   TRIG 74 05/21/2022 1015   HDL 44 05/21/2022 1015   CHOLHDL 2.8 05/21/2022 1015   CHOLHDL 2.9 02/19/2017 0843   VLDL 15 02/19/2017 0843   LDLCALC 62 05/21/2022 1015    Physical Exam:    VS:  BP 116/70   Pulse 67   Ht 5\' 6"  (1.676 m)   Wt 192 lb (87.1 kg)   SpO2 99%   BMI 30.99 kg/m     Wt Readings from Last 3 Encounters:  06/05/22 192 lb (87.1 kg)  05/22/22 185 lb (83.9 kg)   03/31/22 182 lb (82.6 kg)     GEN:  Well nourished, well developed in no acute distress HEENT: Normal NECK: No JVD; No carotid bruits  LYMPHATICS: No lymphadenopathy CARDIAC: RRR, no murmurs, rubs, gallops RESPIRATORY:  Clear to auscultation without rales, wheezing or rhonchi  ABDOMEN: Soft, non-tender, non-distended MUSCULOSKELETAL:  No edema; No deformity  SKIN: Warm and dry NEUROLOGIC:  Alert and oriented x 3 PSYCHIATRIC:  Normal affect   ASSESSMENT:    1. Hyperlipidemia LDL goal <70   2. Coronary artery disease involving native coronary artery of native heart without angina pectoris       PLAN:    In order of problems listed above:  CAD: s/p inferior MI x 2. Aneurysmal RCA. Denies any recent anginal symptoms.  Continue aspirin, Brilinta, Lipitor and metoprolol.   Hyperlipidemia: at goal. LDL 62.  Continue statin.    Follow up in 6 months   Medication Adjustments/Labs and Tests Ordered: Current medicines are reviewed at length with the patient today.  Concerns regarding medicines are outlined above.  No orders of the defined types were placed in this encounter.   No orders of the defined types were placed in this encounter.    There are no Patient Instructions on file for this visit.   Signed, Marguerite Jarboe [place], MD  06/05/2022 8:45 AM    [facility]

## 2022-06-04 DIAGNOSIS — M5441 Lumbago with sciatica, right side: Secondary | ICD-10-CM | POA: Diagnosis not present

## 2022-06-04 DIAGNOSIS — M9903 Segmental and somatic dysfunction of lumbar region: Secondary | ICD-10-CM | POA: Diagnosis not present

## 2022-06-04 DIAGNOSIS — M9905 Segmental and somatic dysfunction of pelvic region: Secondary | ICD-10-CM | POA: Diagnosis not present

## 2022-06-04 DIAGNOSIS — M9902 Segmental and somatic dysfunction of thoracic region: Secondary | ICD-10-CM | POA: Diagnosis not present

## 2022-06-05 ENCOUNTER — Encounter: Payer: Self-pay | Admitting: Cardiology

## 2022-06-05 ENCOUNTER — Ambulatory Visit: Payer: Medicare PPO | Attending: Cardiology | Admitting: Cardiology

## 2022-06-05 VITALS — BP 116/70 | HR 67 | Ht 66.0 in | Wt 192.0 lb

## 2022-06-05 DIAGNOSIS — E785 Hyperlipidemia, unspecified: Secondary | ICD-10-CM

## 2022-06-05 DIAGNOSIS — I2510 Atherosclerotic heart disease of native coronary artery without angina pectoris: Secondary | ICD-10-CM | POA: Diagnosis not present

## 2022-06-11 ENCOUNTER — Telehealth: Payer: Self-pay | Admitting: Family Medicine

## 2022-06-11 ENCOUNTER — Other Ambulatory Visit: Payer: Self-pay | Admitting: Family Medicine

## 2022-06-11 DIAGNOSIS — G4733 Obstructive sleep apnea (adult) (pediatric): Secondary | ICD-10-CM

## 2022-06-11 DIAGNOSIS — N400 Enlarged prostate without lower urinary tract symptoms: Secondary | ICD-10-CM

## 2022-06-11 DIAGNOSIS — I2510 Atherosclerotic heart disease of native coronary artery without angina pectoris: Secondary | ICD-10-CM

## 2022-06-11 DIAGNOSIS — E785 Hyperlipidemia, unspecified: Secondary | ICD-10-CM

## 2022-06-11 MED ORDER — METOPROLOL TARTRATE 25 MG PO TABS: ORAL_TABLET | ORAL | 1 refills | Status: DC

## 2022-06-11 NOTE — Telephone Encounter (Signed)
Fax refill request  Metoprolol tartrate 25 mg  #180

## 2022-06-11 NOTE — Telephone Encounter (Signed)
Is this okay to refill? 

## 2022-06-11 NOTE — Telephone Encounter (Signed)
Done

## 2022-06-24 ENCOUNTER — Other Ambulatory Visit: Payer: Medicare PPO

## 2022-07-01 ENCOUNTER — Ambulatory Visit: Payer: Medicare PPO | Admitting: Family Medicine

## 2022-07-01 ENCOUNTER — Encounter: Payer: Self-pay | Admitting: Family Medicine

## 2022-07-01 VITALS — BP 118/76 | HR 70 | Temp 98.5°F | Ht 66.5 in | Wt 191.8 lb

## 2022-07-01 DIAGNOSIS — E785 Hyperlipidemia, unspecified: Secondary | ICD-10-CM

## 2022-07-01 DIAGNOSIS — I480 Paroxysmal atrial fibrillation: Secondary | ICD-10-CM | POA: Diagnosis not present

## 2022-07-01 DIAGNOSIS — I5032 Chronic diastolic (congestive) heart failure: Secondary | ICD-10-CM | POA: Diagnosis not present

## 2022-07-01 DIAGNOSIS — N400 Enlarged prostate without lower urinary tract symptoms: Secondary | ICD-10-CM

## 2022-07-01 DIAGNOSIS — K449 Diaphragmatic hernia without obstruction or gangrene: Secondary | ICD-10-CM

## 2022-07-01 DIAGNOSIS — I700 Atherosclerosis of aorta: Secondary | ICD-10-CM | POA: Diagnosis not present

## 2022-07-01 DIAGNOSIS — Z87891 Personal history of nicotine dependence: Secondary | ICD-10-CM

## 2022-07-01 DIAGNOSIS — N529 Male erectile dysfunction, unspecified: Secondary | ICD-10-CM

## 2022-07-01 DIAGNOSIS — Z0000 Encounter for general adult medical examination without abnormal findings: Secondary | ICD-10-CM

## 2022-07-01 DIAGNOSIS — I252 Old myocardial infarction: Secondary | ICD-10-CM

## 2022-07-01 DIAGNOSIS — K2100 Gastro-esophageal reflux disease with esophagitis, without bleeding: Secondary | ICD-10-CM

## 2022-07-01 DIAGNOSIS — G4733 Obstructive sleep apnea (adult) (pediatric): Secondary | ICD-10-CM | POA: Diagnosis not present

## 2022-07-01 DIAGNOSIS — Z9889 Other specified postprocedural states: Secondary | ICD-10-CM

## 2022-07-01 NOTE — Progress Notes (Signed)
Complete physical exam  Patient: Henry Rogers   DOB: 1950-07-23   70 y.o. Male  MRN: [id number]  Subjective:    Chief Complaint  Patient presents with   Annual Exam    CPE ONLY already had MWV with Herbie Saxon, no other issues,     Henry Rogers is a 71 y.o. male who presents today for a complete physical exam. He reports consuming a general diet. Home exercise routine includes walking 1 hrs per day. He generally feels well. He reports sleeping fairly well. He continues to have difficulty with neck pain and is getting chiropractic manipulations on a regular basis which does seem to help.  He also complains of difficulty with pain at the second MCP joint on the left hand especially when he bangs it into something.  Functionally he can do what ever he needs to do.  He does have a history of BPH and does occasionally have difficulty with urgency.  In the past he had been tried on various medications none of which really worked as well as the present Cialis.  He does complain of some urgency as well as decreased stream.  He does have underlying coronary artery disease as well as atherosclerosis.  He follows up regularly with cardiology.  He does not complain of chest pain, shortness of breath, PND or DOE.  He has a pending CT scan of his chest to evaluate risk for lung cancer.  He has had a Nissen fundoplication which she states helped him greatly with his reflux symptoms.  He will occasionally have difficulty with that.  Does have OSA but presently is not on a CPAP stating apparently is not high enough level to cause any difficulty.  Most recent fall risk assessment:    07/01/2022   10:35 AM  Fall Risk   Falls in the past year? 0  Number falls in past yr: 0  Injury with Fall? 0  Risk for fall due to : No Fall Risks  Follow up Falls evaluation completed     Most recent depression screenings:    07/01/2022   10:36 AM 09/27/2021    9:51 AM  PHQ 2/9 Scores  PHQ - 2 Score 0 0         Patient Care Team: Ronnald Nian, MD as PCP - General (Family Medicine) [place], Peter M, MD as PCP - Cardiology (Cardiology) [place], Peter M, MD as Consulting Physician (Cardiology)   Outpatient Medications Prior to Visit  Medication Sig Note   aspirin 81 MG tablet Take 81 mg by mouth daily.    atorvastatin (LIPITOR) 80 MG tablet TAKE 1 TABLET(80 MG) BY MOUTH DAILY AT 6 PM    Melatonin 10 MG CAPS Take 10 mg by mouth at bedtime.    metoprolol tartrate (LOPRESSOR) 25 MG tablet TAKE 1 TABLET(25 MG) BY MOUTH TWICE DAILY    Multiple Vitamin (MULTIVITAMIN) tablet Take 1 tablet by mouth every evening. 50 Plus    Nutritional Supplements (JUICE PLUS FIBRE PO) Take 1 tablet by mouth 2 (two) times daily. Fruit blend and vegetable blend    omega-3 acid ethyl esters (LOVAZA) 1 g capsule TAKE 2 CAPSULES BY MOUTH TWICE DAILY    tadalafil (CIALIS) 5 MG tablet TAKE 1 TABLET(5 MG) BY MOUTH DAILY    ticagrelor (BRILINTA) 60 MG TABS tablet TAKE 1 TABLET(60 MG) BY MOUTH TWICE DAILY    nitroGLYCERIN (NITROSTAT) 0.4 MG SL tablet Place 1 tablet (0.4 mg total) under the tongue every 5 (  five) minutes as needed for chest pain. MAX 3 doses (Patient not taking: Reported on 07/01/2022)    zolpidem (AMBIEN) 5 MG tablet TAKE 1 TABLET BY MOUTH AT BEDTIME AS NEEDED FOR SLEEP (Patient not taking: Reported on 07/01/2022) 07/01/2022: prn   No facility-administered medications prior to visit.    ROS        Objective:     BP 118/76   Pulse 70   Temp 98.5 F (36.9 C)   Ht 5' 6.5" (1.689 m)   Wt 191 lb 12.8 oz (87 kg)   BMI 30.49 kg/m    Physical Exam  Alert and in no distress. Tympanic membranes and canals are normal. Pharyngeal area is normal. Neck is supple without adenopathy or thyromegaly. Cardiac exam shows a regular sinus rhythm without murmurs or gallops. Lungs are clear to auscultation.  Exam of the hand shows no palpable tenderness with normal motion and normal function.       Assessment & Plan:    Routine general medical examination at a health care facility  PAF (paroxysmal atrial fibrillation) (HCC)  Old inferior wall myocardial infarction - Plan: Amb Referral to Cardiac Rehabilitation  Chronic diastolic CHF (congestive heart failure) (HCC)  Aortic atherosclerosis (HCC)  OSA (obstructive sleep apnea)  Hiatal hernia with GERD and esophagitis  Hyperlipidemia LDL goal <70  Former smoker  Vasculogenic erectile dysfunction, unspecified vasculogenic erectile dysfunction type  History of fundoplication  Benign prostatic hyperplasia without lower urinary tract symptoms  Immunization History  Administered Date(s) Administered   Fluad Quad(high Dose 65+) 03/24/2019   Hepatitis A, Adult 03/14/2021, 09/19/2021   Influenza Split 05/24/2013   Influenza, High Dose Seasonal PF 04/12/2014, 09/04/2015, 04/03/2018, 03/12/2020, 04/02/2022, 04/02/2022   Influenza, Seasonal, Injecte, Preservative Fre 03/24/2019   Influenza-Unspecified 04/07/2016, 04/01/2017, 06/01/2017, 07/10/2018, 04/18/2021   PFIZER Comirnaty(Gray Top)Covid-19 Tri-Sucrose Vaccine 11/20/2020, 04/09/2022   PFIZER(Purple Top)SARS-COV-2 Vaccination 08/27/2019, 09/21/2019, 04/07/2020   Pfizer Covid-19 Vaccine Bivalent Booster 10yrs & up 04/18/2021, 11/22/2021   Pneumococcal Conjugate-13 01/03/2015   Pneumococcal Polysaccharide-23 01/16/2009   RSV IGIV 04/02/2022   Rsv, Bivalent, Protein Subunit Rsvpref,pf Verdis Frederickson) 04/02/2022   Tdap 01/16/2009, 07/10/2018   Typhoid Live 04/03/2021   Zoster Recombinat (Shingrix) 04/01/2017, 06/01/2017   Zoster, Live 01/16/2009    Health Maintenance  Topic Date Due   Lung Cancer Screening  01/18/2016   COVID-19 Vaccine (8 - 2023-24 season) 06/04/2022   Medicare Annual Wellness (AWV)  09/28/2022   COLONOSCOPY (Pts 45-58yrs Insurance coverage will need to be confirmed)  03/28/2025   DTaP/Tdap/Td (3 - Td or Tdap) 07/10/2028   INFLUENZA VACCINE  Completed    Hepatitis C Screening  Completed   Zoster Vaccines- Shingrix  Completed   HPV VACCINES  Aged Out   Pneumonia Vaccine 66+ Years old  Discontinued    Discussed health benefits of physical activity, and encouraged him to engage in regular exercise appropriate for his age and condition.  He would like to be referred back to the rehab facility to get back into their program.  The order was placed.  He will continue on his present medication regimen.  Discussed the neck pain and at this point recommend continue with physical therapy.  Explained that the hand since he is having no functional difficulties would be difficult for any intervention.  Explained that I could refer him if he wanted.  Discussed the use of finasteride to help with his BPH symptoms since he is having some decreased stream and occasional urgency.  He will keep  me informed as to whether he wants to pursue this or not.  Problem List Items Addressed This Visit     Aortic atherosclerosis (HCC)   BPH (benign prostatic hyperplasia)   Chronic diastolic CHF (congestive heart failure) (HCC)   ED (erectile dysfunction)   Former smoker   Hiatal hernia with GERD and esophagitis   History of fundoplication   Hyperlipidemia LDL goal <70   Old inferior wall myocardial infarction   Relevant Orders   Amb Referral to Cardiac Rehabilitation   OSA (obstructive sleep apnea)   PAF (paroxysmal atrial fibrillation) (HCC)   Other Visit Diagnoses     Routine general medical examination at a health care facility    -  Primary      No follow-ups on file.     Sharlot Gowda, MD

## 2022-07-02 ENCOUNTER — Telehealth (HOSPITAL_COMMUNITY): Payer: Self-pay | Admitting: *Deleted

## 2022-07-02 NOTE — Telephone Encounter (Signed)
In basket message sent to Dr. Susann Givens:  "Dr. Susann Givens  Regrettable we are not currently offering cardiac or pulmonary maintenance. As we moved in our new location, we are focusing in on increasing our insurance based program.  In the future there may be discussions on resuming a type of maintenance program.  Options for Gabriel Rung and Gunnar Fusi, who we have had in the past and greatly enjoyed their presence, is the program at [facility].  I believe they both will enjoy their programming as they have independent as well as group activities along with aquatic opportunities. I will reach out to them to see if this is something they would be interested in pursuing.  Karlene Lineman RN, BSN Cardiac and Pulmonary Rehab Nurse Navigator"    Called and spoke to [place] regarding our current offerings for cardiac rehab. Joe was hoping he and his wife, who are former patients, could return for exercise and the accountability for consistent attendance.  Offered the option for [employer] program at [facility].  Gave general features along with contact information for both phone or internet.  Thanked me for the information. Alanson Aly, BSN Cardiac and Emergency planning/management officer

## 2022-07-09 DIAGNOSIS — M9903 Segmental and somatic dysfunction of lumbar region: Secondary | ICD-10-CM | POA: Diagnosis not present

## 2022-07-09 DIAGNOSIS — M5441 Lumbago with sciatica, right side: Secondary | ICD-10-CM | POA: Diagnosis not present

## 2022-07-09 DIAGNOSIS — M9902 Segmental and somatic dysfunction of thoracic region: Secondary | ICD-10-CM | POA: Diagnosis not present

## 2022-07-09 DIAGNOSIS — M9905 Segmental and somatic dysfunction of pelvic region: Secondary | ICD-10-CM | POA: Diagnosis not present

## 2022-07-10 ENCOUNTER — Ambulatory Visit
Admission: RE | Admit: 2022-07-10 | Discharge: 2022-07-10 | Disposition: A | Discharge status: Home / Self Care | Payer: Medicare PPO | Source: Ambulatory Visit | Attending: Family Medicine | Admitting: Family Medicine

## 2022-07-10 DIAGNOSIS — Z87891 Personal history of nicotine dependence: Secondary | ICD-10-CM | POA: Diagnosis not present

## 2022-08-06 DIAGNOSIS — M5441 Lumbago with sciatica, right side: Secondary | ICD-10-CM | POA: Diagnosis not present

## 2022-08-06 DIAGNOSIS — M9905 Segmental and somatic dysfunction of pelvic region: Secondary | ICD-10-CM | POA: Diagnosis not present

## 2022-08-06 DIAGNOSIS — M9902 Segmental and somatic dysfunction of thoracic region: Secondary | ICD-10-CM | POA: Diagnosis not present

## 2022-08-06 DIAGNOSIS — M9903 Segmental and somatic dysfunction of lumbar region: Secondary | ICD-10-CM | POA: Diagnosis not present

## 2022-08-20 DIAGNOSIS — L570 Actinic keratosis: Secondary | ICD-10-CM | POA: Diagnosis not present

## 2022-08-20 DIAGNOSIS — D235 Other benign neoplasm of skin of trunk: Secondary | ICD-10-CM | POA: Diagnosis not present

## 2022-08-20 DIAGNOSIS — D225 Melanocytic nevi of trunk: Secondary | ICD-10-CM | POA: Diagnosis not present

## 2022-08-20 DIAGNOSIS — L578 Other skin changes due to chronic exposure to nonionizing radiation: Secondary | ICD-10-CM | POA: Diagnosis not present

## 2022-08-20 DIAGNOSIS — Z808 Family history of malignant neoplasm of other organs or systems: Secondary | ICD-10-CM | POA: Diagnosis not present

## 2022-08-20 DIAGNOSIS — D179 Benign lipomatous neoplasm, unspecified: Secondary | ICD-10-CM | POA: Diagnosis not present

## 2022-08-20 DIAGNOSIS — D2272 Melanocytic nevi of left lower limb, including hip: Secondary | ICD-10-CM | POA: Diagnosis not present

## 2022-08-20 DIAGNOSIS — Z86018 Personal history of other benign neoplasm: Secondary | ICD-10-CM | POA: Diagnosis not present

## 2022-08-20 DIAGNOSIS — D2262 Melanocytic nevi of left upper limb, including shoulder: Secondary | ICD-10-CM | POA: Diagnosis not present

## 2022-08-29 ENCOUNTER — Telehealth: Payer: Self-pay | Admitting: Family Medicine

## 2022-08-29 NOTE — Telephone Encounter (Signed)
Pt called & stated he was going out of town & needed his Tadalafil asap. P.A. TADALAFIL completed

## 2022-09-06 NOTE — Telephone Encounter (Signed)
Approved, called pharmacy went thru, pt informed

## 2022-09-17 ENCOUNTER — Telehealth: Payer: Self-pay | Admitting: Family Medicine

## 2022-09-17 DIAGNOSIS — M9903 Segmental and somatic dysfunction of lumbar region: Secondary | ICD-10-CM | POA: Diagnosis not present

## 2022-09-17 DIAGNOSIS — M9902 Segmental and somatic dysfunction of thoracic region: Secondary | ICD-10-CM | POA: Diagnosis not present

## 2022-09-17 DIAGNOSIS — M9905 Segmental and somatic dysfunction of pelvic region: Secondary | ICD-10-CM | POA: Diagnosis not present

## 2022-09-17 DIAGNOSIS — M5441 Lumbago with sciatica, right side: Secondary | ICD-10-CM | POA: Diagnosis not present

## 2022-09-17 NOTE — Telephone Encounter (Signed)
Contacted [facility] to schedule their annual wellness visit. Appointment made for 09/30/22.  Barkley Boards AWV direct phone # [phone]

## 2022-09-17 NOTE — Telephone Encounter (Signed)
Called patient to schedule Medicare Annual Wellness Visit (AWV). Left message for patient to call back and schedule Medicare Annual Wellness Visit (AWV).  Last date of AWV: 09/27/21  Please schedule an appointment at any time with [facility].  If any questions, please contact me at [phone].  Thank you ,  Barkley Boards AWV direct phone # [phone]

## 2022-09-30 ENCOUNTER — Ambulatory Visit (INDEPENDENT_AMBULATORY_CARE_PROVIDER_SITE_OTHER): Payer: Medicare PPO

## 2022-09-30 VITALS — Ht 66.0 in | Wt 190.0 lb

## 2022-09-30 DIAGNOSIS — Z0000 Encounter for general adult medical examination without abnormal findings: Secondary | ICD-10-CM

## 2022-09-30 NOTE — Progress Notes (Signed)
I connected with  Henry Rogers on 09/30/22 by a audio enabled telemedicine application and verified that I am speaking with the correct person using two identifiers.  Patient Location: Home  Provider Location: Office/Clinic  I discussed the limitations of evaluation and management by telemedicine. The patient expressed understanding and agreed to proceed.  Subjective:   Henry Rogers is a 72 y.o. male who presents for Medicare Annual/Subsequent preventive examination.  Review of Systems     Cardiac Risk Factors include: advanced age (>80mn, >>2women);dyslipidemia;male gender;obesity (BMI >30kg/m2)     Objective:    Today's Vitals   09/30/22 1026  Weight: 190 lb (86.2 kg)  Height: '5\' 6"'$  (1.676 m)  PainSc: 1    Body mass index is 30.67 kg/m.     09/30/2022   10:31 AM 09/27/2021    9:50 AM 09/17/2021   10:47 AM 07/17/2021   10:25 AM 03/14/2021    8:40 AM 03/13/2020   11:11 AM 02/25/2018    8:50 AM  Advanced Directives  Does Patient Have a Medical Advance Directive? Yes Yes Yes Yes Yes No;Yes Yes  Type of Advance Directive Out of facility DNR (pink MOST or yellow form) Out of facility DNR (pink MOST or yellow form)  [place] Living will;Healthcare Power of Attorney Living will [place]  Does patient want to make changes to medical advance directive?    No - Patient declined No - Patient declined No - Patient declined No - Patient declined  Copy of [place]?    No - copy requested Yes - validated most recent copy scanned in chart (See row information)  No - copy requested  Would patient like information on creating a medical advance directive?      No - Patient declined     Current Medications (verified) Outpatient Encounter Medications as of 09/30/2022  Medication Sig   aspirin 81 MG tablet Take 81 mg by mouth daily.   atorvastatin (LIPITOR) 80 MG tablet TAKE 1 TABLET(80 MG) BY  MOUTH DAILY AT 6 PM   metoprolol tartrate (LOPRESSOR) 25 MG tablet TAKE 1 TABLET(25 MG) BY MOUTH TWICE DAILY   Multiple Vitamin (MULTIVITAMIN) tablet Take 1 tablet by mouth every evening. 50 Plus   nitroGLYCERIN (NITROSTAT) 0.4 MG SL tablet Place 1 tablet (0.4 mg total) under the tongue every 5 (five) minutes as needed for chest pain. MAX 3 doses   Nutritional Supplements (JUICE PLUS FIBRE PO) Take 1 tablet by mouth 2 (two) times daily. Fruit blend and vegetable blend   omega-3 acid ethyl esters (LOVAZA) 1 g capsule TAKE 2 CAPSULES BY MOUTH TWICE DAILY   tadalafil (CIALIS) 5 MG tablet TAKE 1 TABLET(5 MG) BY MOUTH DAILY   ticagrelor (BRILINTA) 60 MG TABS tablet TAKE 1 TABLET(60 MG) BY MOUTH TWICE DAILY   Melatonin 10 MG CAPS Take 10 mg by mouth at bedtime.   zolpidem (AMBIEN) 5 MG tablet TAKE 1 TABLET BY MOUTH AT BEDTIME AS NEEDED FOR SLEEP (Patient not taking: Reported on 07/01/2022)   No facility-administered encounter medications on file as of 09/30/2022.    Allergies (verified) Ace inhibitors   History: Past Medical History:  Diagnosis Date   BPH (benign prostatic hyperplasia)    CAD (coronary artery disease)    a. inf STEMI s/p BMS to mRCA (02/03/14)  b. inf STEMI s/p PCTA to RCA with slow reflow.   Chronic diastolic CHF (congestive heart failure) ([place]  a. 2D echo on 05/18/15 LVEF 55-60%, akinesis basal inferior wall, G2DD     GERD (gastroesophageal reflux disease)    Hearing loss    Hemorrhoids    HH (hiatus hernia)    Ischemic cardiomyopathy    a. ECHO 01/2014 EF 45-50% and akinesis of the entire inferior myocardium. Mild MR.   b. now resolved (EF 55-60% on [id number])     Metabolic syndrome    Myocardial infarction ([place]) 2015   Heart attack in 2015 and 2017   OSA (obstructive sleep apnea)    a. mild not requiring CPAP at this time   PAF (paroxysmal atrial fibrillation) ([place])    a. brief episode after cardiac cath on 05/16/15- resolved after IV amiodarone.   Tinnitus     Varicose veins    Past Surgical History:  Procedure Laterality Date   CARDIAC CATHETERIZATION N/A 05/16/2015   Procedure: Left Heart Cath and Coronary Angiography;  Surgeon: Belva Crome, MD;  Location: [place] [facility];  Service: Cardiovascular;  Laterality: N/A;   CARDIAC CATHETERIZATION N/A 05/16/2015   Procedure: Coronary Stent Intervention;  Surgeon: Belva Crome, MD;  Location: [place] [facility];  Service: Cardiovascular;  Laterality: N/A;   CARPAL TUNNEL RELEASE Bilateral 03/02/2015   Procedure: BILATERAL CARPAL TUNNEL RELEASE ;  Surgeon: Daryll Brod, MD;  Location: [place];  Service: Orthopedics;  Laterality: Bilateral;   COLONOSCOPY  07/21/2004   Dr. Benson Norway   CORONARY ANGIOPLASTY WITH STENT PLACEMENT  02/02/2014   BMS to RCA   ENDOVENOUS ABLATION SAPHENOUS VEIN W/ LASER Right 10/12/2014   EVLA right greater saphenous vein by Curt Jews MD   ESOPHAGEAL DILATION  07/23/2021   Procedure: ESOPHAGEAL DILATION;  Surgeon: Lavena Bullion, DO;  Location: [place];  Service: Endoscopy;;   ESOPHAGOGASTRODUODENOSCOPY N/A 07/23/2021   Procedure: ESOPHAGOGASTRODUODENOSCOPY (EGD);  Surgeon: Lavena Bullion, DO;  Location: [place];  Service: Endoscopy;  Laterality: N/A;   HIATAL HERNIA REPAIR N/A 07/23/2021   Procedure: LAPAROSCOPIC REPAIR OF HIATAL HERNIA, LAPOROSCOPIC  TRANSVERSE ABDOMINIS PLANE (TAP) BLOCK ;  Surgeon: Greer Pickerel, MD;  Location: [place];  Service: General;  Laterality: N/A;   LEFT HEART CATHETERIZATION WITH CORONARY ANGIOGRAM N/A 02/02/2014   Procedure: LEFT HEART CATHETERIZATION WITH CORONARY ANGIOGRAM;  Surgeon: Peter M Martinique, MD;  Location: [facility];  Service: Cardiovascular;  Laterality: N/A;   SKIN BIOPSY Left 06/19/2021   seborrheic keratosis   stab phlebectomy  Right 03/15/2015   stab phlebectomy > 20 incisions (right leg) by Curt Jews MD   TRANSORAL INCISIONLESS FUNDOPLICATION N/A [id number]   Procedure: TRANSORAL INCISIONLESS FUNDOPLICATION;   Surgeon: Lavena Bullion, DO;  Location: [place] [facility];  Service: Endoscopy;  Laterality: N/A;   Family History  Problem Relation Age of Onset   Hypertension Mother    Cervical cancer Mother    Breast cancer Mother    Skin cancer Mother    Pancreatic cancer Brother    Heart disease Brother    Colon cancer Neg Hx    Esophageal cancer Neg Hx    Social History   Socioeconomic History   Marital status: Married    Spouse name: Not on file   Number of children: Not on file   Years of education: Not on file   Highest education level: Not on file  Occupational History   Not on file  Tobacco Use   Smoking status: Former   Smokeless tobacco: Never  Vaping Use   Vaping Use:  Never used  Substance and Sexual Activity   Alcohol use: Yes    Alcohol/week: 3.0 standard drinks of alcohol    Types: 3 Glasses of wine per week   Drug use: No   Sexual activity: Not Currently  Other Topics Concern   Not on file  Social History Narrative   Not on file   Social Determinants of Health   Financial Resource Strain: Low Risk  (09/26/2022)   Overall Financial Resource Strain (CARDIA)    Difficulty of Paying Living Expenses: Not hard at all  Food Insecurity: No Food Insecurity (09/30/2022)   Hunger Vital Sign    Worried About Running Out of Food in the Last Year: Never true    Ran Out of Food in the Last Year: Never true  Transportation Needs: No Transportation Needs (09/30/2022)   PRAPARE - Hydrologist (Medical): No    Lack of Transportation (Non-Medical): No  Physical Activity: Insufficiently Active (09/26/2022)   Exercise Vital Sign    Days of Exercise per Week: 6 days    Minutes of Exercise per Session: 20 min  Stress: No Stress Concern Present (09/26/2022)   [place]    Feeling of Stress : Only a little  Social Connections: Unknown (09/26/2022)   Social Connection and Isolation Panel [NHANES]     Frequency of Communication with Friends and Family: More than three times a week    Frequency of Social Gatherings with Friends and Family: Once a week    Attends Religious Services: Not on Advertising copywriter or Organizations: Yes    Attends Archivist Meetings: 1 to 4 times per year    Marital Status: Married    Tobacco Counseling Counseling given: Not Answered   Clinical Intake:  Pre-visit preparation completed: Yes  Pain : 0-10 Pain Score: 1  Pain Type: Chronic pain Pain Location: Neck Pain Descriptors / Indicators: Aching Pain Onset: More than a month ago Pain Frequency: Constant     Nutritional Status: BMI > 30  Obese Nutritional Risks: None Diabetes: No  How often do you need to have someone help you when you read instructions, pamphlets, or other written materials from your doctor or pharmacy?: 1 - Never  Diabetic? no  Interpreter Needed?: No  Information entered by :: NAllen LPN   Activities of Daily Living    09/26/2022    8:49 AM  In your present state of health, do you have any difficulty performing the following activities:  Hearing? 1  Comment has hearing aids  Vision? 0  Difficulty concentrating or making decisions? 0  Walking or climbing stairs? 0  Dressing or bathing? 0  Doing errands, shopping? 0  Preparing Food and eating ? N  Using the Toilet? N  In the past six months, have you accidently leaked urine? Y  Do you have problems with loss of bowel control? N  Managing your Medications? N  Managing your Finances? N  Housekeeping or managing your Housekeeping? N    Patient Care Team: Denita Lung, MD as PCP - General (Family Medicine) Martinique, Peter M, MD as PCP - Cardiology (Cardiology) Martinique, Peter M, MD as Consulting Physician (Cardiology)  Indicate any recent Medical Services you may have received from other than [facility] providers in the past year (date may be approximate).     Assessment:   This is a routine  wellness examination for Rajan.  Hearing/Vision screen Vision Screening - Comments:: Regular eye exams, [facility]  Dietary issues and exercise activities discussed: Current Exercise Habits: Home exercise routine, Type of exercise: walking, Time (Minutes): 20, Frequency (Times/Week): 6, Weekly Exercise (Minutes/Week): 120   Goals Addressed             This Visit's Progress    Patient Stated       09/30/2022, wants to get more active exercise and improve overall strength       Depression Screen    09/30/2022   10:32 AM 07/01/2022   10:36 AM 09/27/2021    9:51 AM 03/14/2021    8:41 AM 03/13/2020   11:12 AM 03/01/2019    8:23 AM 02/25/2018    8:21 AM  PHQ 2/9 Scores  PHQ - 2 Score 0 0 0 0 0 0 0    Fall Risk    09/26/2022    8:49 AM 07/01/2022   10:35 AM 09/27/2021    9:51 AM 03/14/2021    8:41 AM 03/13/2020   11:12 AM  Fall Risk   Falls in the past year? 0 0 0 0 0  Number falls in past yr: 0 0  0   Injury with Fall? 0 0  0   Risk for fall due to : Medication side effect No Fall Risks Medication side effect  No Fall Risks  Follow up Falls prevention discussed;Education provided;Falls evaluation completed Falls evaluation completed Falls evaluation completed;Education provided;Falls prevention discussed Falls evaluation completed     FALL RISK PREVENTION PERTAINING TO THE HOME:  Any stairs in or around the home? Yes  If so, are there any without handrails? No  Home free of loose throw rugs in walkways, pet beds, electrical cords, etc? Yes  Adequate lighting in your home to reduce risk of falls? Yes   ASSISTIVE DEVICES UTILIZED TO PREVENT FALLS:  Life alert? No  Use of a cane, walker or w/c? No  Grab bars in the bathroom? Yes  Shower chair or bench in shower? No  Elevated toilet seat or a handicapped toilet? Yes   TIMED UP AND GO:  Was the test performed? No .      Cognitive Function:        09/30/2022   10:33 AM  6CIT Screen  What Year? 0 points  What  month? 0 points  What time? 0 points  Count back from 20 0 points  Months in reverse 0 points  Repeat phrase 0 points  Total Score 0 points    Immunizations Immunization History  Administered Date(s) Administered   Fluad Quad(high Dose 65+) 03/24/2019   Hepatitis A, Adult 03/14/2021, 09/19/2021   Influenza Split 05/24/2013   Influenza, High Dose Seasonal PF 04/12/2014, 09/04/2015, 04/03/2018, 03/12/2020, 04/02/2022, 04/02/2022   Influenza, Seasonal, Injecte, Preservative Fre 03/24/2019   Influenza-Unspecified 04/07/2016, 04/01/2017, 06/01/2017, 07/10/2018, 04/18/2021   PFIZER Comirnaty(Gray Top)Covid-19 Tri-Sucrose Vaccine 11/20/2020, 04/09/2022   PFIZER(Purple Top)SARS-COV-2 Vaccination 08/27/2019, 09/21/2019, 04/07/2020   Pfizer Covid-19 Vaccine Bivalent Booster 32yr & up 04/18/2021, 11/22/2021   Pneumococcal Conjugate-13 01/03/2015   Pneumococcal Polysaccharide-23 01/16/2009   RSV IGIV 04/02/2022   Rsv, Bivalent, Protein Subunit Rsvpref,pf (Evans Lance 04/02/2022   Tdap 01/16/2009, 07/10/2018   Typhoid Live 04/03/2021   Zoster Recombinat (Shingrix) 04/01/2017, 06/01/2017   Zoster, Live 01/16/2009    TDAP status: Up to date  Flu Vaccine status: Up to date  Pneumococcal vaccine status: Up to date  Covid-19 vaccine status: Completed vaccines  Qualifies for Shingles Vaccine? Yes  Zostavax completed Yes   Shingrix Completed?: Yes  Screening Tests Health Maintenance  Topic Date Due   COVID-19 Vaccine (8 - 2023-24 season) 06/04/2022   Medicare Annual Wellness (AWV)  09/28/2022   Lung Cancer Screening  07/11/2023   COLONOSCOPY (Pts 45-60yr Insurance coverage will need to be confirmed)  03/28/2025   DTaP/Tdap/Td (3 - Td or Tdap) 07/10/2028   INFLUENZA VACCINE  Completed   Hepatitis C Screening  Completed   Zoster Vaccines- Shingrix  Completed   HPV VACCINES  Aged Out   Pneumonia Vaccine 72 Years old  [place]est [place] Due   Topic Date Due   COVID-19 Vaccine (8 - 2023-24 season) 06/04/2022   Medicare Annual Wellness (AWV)  09/28/2022    Colorectal cancer screening: Type of screening: Colonoscopy. Completed 03/29/2015. Repeat every 10 years  Lung Cancer Screening: (Low Dose CT Chest recommended if Age 74-80years, 30 pack-year currently smoking OR have quit w/in 15years.) does not qualify.   Lung Cancer Screening Referral: no  Additional Screening:  Hepatitis C Screening: does qualify; Completed 02/20/2012  Vision Screening: Recommended annual ophthalmology exams for early detection of glaucoma and other disorders of the eye. Is the patient up to date with their annual eye exam?  Yes  Who is the provider or what is the name of the office in which the patient attends annual eye exams? [facility] If pt is not established with a provider, would they like to be referred to a provider to establish care? No .   Dental Screening: Recommended annual dental exams for proper oral hygiene  Community Resource Referral / Chronic Care Management: CRR required this visit?  No   CCM required this visit?  No      Plan:     I have personally reviewed and noted the following in the patient's chart:   Medical and social history Use of alcohol, tobacco or illicit drugs  Current medications and supplements including opioid prescriptions. Patient is not currently taking opioid prescriptions. Functional ability and status Nutritional status Physical activity Advanced directives List of other physicians Hospitalizations, surgeries, and ER visits in previous 12 months Vitals Screenings to include cognitive, depression, and falls Referrals and appointments  In addition, I have reviewed and discussed with patient certain preventive protocols, quality metrics, and best practice recommendations. A written personalized care plan for preventive services as well as general preventive health recommendations were provided  to patient.     NKellie Simmering LPN   [id number]  Nurse Notes: none  Due to this being a virtual visit, the after visit summary with patients personalized plan was offered to patient via mail or my-chart.  Patient would like to access on my-chart

## 2022-09-30 NOTE — Patient Instructions (Signed)
Henry Rogers , Thank you for taking time to come for your Medicare Wellness Visit. I appreciate your ongoing commitment to your health goals. Please review the following plan we discussed and let me know if I can assist you in the future.   These are the goals we discussed:  Goals      Patient Stated     09/27/2021, stay healthy     Patient Stated     09/30/2022, wants to get more active exercise and improve overall strength        This is a list of the screening recommended for you and due dates:  Health Maintenance  Topic Date Due   COVID-19 Vaccine (8 - 2023-24 season) 06/04/2022   Screening for Lung Cancer  07/11/2023   Medicare Annual Wellness Visit  09/30/2023   Colon Cancer Screening  03/28/2025   DTaP/Tdap/Td vaccine (3 - Td or Tdap) 07/10/2028   Flu Shot  Completed   Hepatitis C Screening: USPSTF Recommendation to screen - Ages 18-79 yo.  Completed   Zoster (Shingles) Vaccine  Completed   HPV Vaccine  Aged Out   Pneumonia Vaccine  Discontinued    Advanced directives: copy in chart  Conditions/risks identified: none  Next appointment: Follow up in one year for your annual wellness visit.   Preventive Care 66 Years and Older, Male  Preventive care refers to lifestyle choices and visits with your health care provider that can promote health and wellness. What does preventive care include? A yearly physical exam. This is also called an annual well check. Dental exams once or twice a year. Routine eye exams. Ask your health care provider how often you should have your eyes checked. Personal lifestyle choices, including: Daily care of your teeth and gums. Regular physical activity. Eating a healthy diet. Avoiding tobacco and drug use. Limiting alcohol use. Practicing safe sex. Taking low doses of aspirin every day. Taking vitamin and mineral supplements as recommended by your health care provider. What happens during an annual well check? The services and  screenings done by your health care provider during your annual well check will depend on your age, overall health, lifestyle risk factors, and family history of disease. Counseling  Your health care provider may ask you questions about your: Alcohol use. Tobacco use. Drug use. Emotional well-being. Home and relationship well-being. Sexual activity. Eating habits. History of falls. Memory and ability to understand (cognition). Work and work Statistician. Screening  You may have the following tests or measurements: Height, weight, and BMI. Blood pressure. Lipid and cholesterol levels. These may be checked every 5 years, or more frequently if you are over 89 years old. Skin check. Lung cancer screening. You may have this screening every year starting at age 40 if you have a 30-pack-year history of smoking and currently smoke or have quit within the past 15 years. Fecal occult blood test (FOBT) of the stool. You may have this test every year starting at age 56. Flexible sigmoidoscopy or colonoscopy. You may have a sigmoidoscopy every 5 years or a colonoscopy every 10 years starting at age 61. Prostate cancer screening. Recommendations will vary depending on your family history and other risks. Hepatitis C blood test. Hepatitis B blood test. Sexually transmitted disease (STD) testing. Diabetes screening. This is done by checking your blood sugar (glucose) after you have not eaten for a while (fasting). You may have this done every 1-3 years. Abdominal aortic aneurysm (AAA) screening. You may need this if you are a  current or former smoker. Osteoporosis. You may be screened starting at age 2 if you are at high risk. Talk with your health care provider about your test results, treatment options, and if necessary, the need for more tests. Vaccines  Your health care provider may recommend certain vaccines, such as: Influenza vaccine. This is recommended every year. Tetanus, diphtheria, and  acellular pertussis (Tdap, Td) vaccine. You may need a Td booster every 10 years. Zoster vaccine. You may need this after age 69. Pneumococcal 13-valent conjugate (PCV13) vaccine. One dose is recommended after age 23. Pneumococcal polysaccharide (PPSV23) vaccine. One dose is recommended after age 70. Talk to your health care provider about which screenings and vaccines you need and how often you need them. This information is not intended to replace advice given to you by your health care provider. Make sure you discuss any questions you have with your health care provider. Document Released: 08/03/2015 Document Revised: 03/26/2016 Document Reviewed: 05/08/2015 Elsevier Interactive Patient Education  2017 [place] Prevention in the Home Falls can cause injuries. They can happen to people of all ages. There are many things you can do to make your home safe and to help prevent falls. What can I do on the outside of my home? Regularly fix the edges of walkways and driveways and fix any cracks. Remove anything that might make you trip as you walk through a door, such as a raised step or threshold. Trim any bushes or trees on the path to your home. Use bright outdoor lighting. Clear any walking paths of anything that might make someone trip, such as rocks or tools. Regularly check to see if handrails are loose or broken. Make sure that both sides of any steps have handrails. Any raised decks and porches should have guardrails on the edges. Have any leaves, snow, or ice cleared regularly. Use sand or salt on walking paths during winter. Clean up any spills in your garage right away. This includes oil or grease spills. What can I do in the bathroom? Use night lights. Install grab bars by the toilet and in the tub and shower. Do not use towel bars as grab bars. Use non-skid mats or decals in the tub or shower. If you need to sit down in the shower, use a plastic, non-slip stool. Keep  the floor dry. Clean up any water that spills on the floor as soon as it happens. Remove soap buildup in the tub or shower regularly. Attach bath mats securely with double-sided non-slip rug tape. Do not have throw rugs and other things on the floor that can make you trip. What can I do in the bedroom? Use night lights. Make sure that you have a light by your bed that is easy to reach. Do not use any sheets or blankets that are too big for your bed. They should not hang down onto the floor. Have a firm chair that has side arms. You can use this for support while you get dressed. Do not have throw rugs and other things on the floor that can make you trip. What can I do in the kitchen? Clean up any spills right away. Avoid walking on wet floors. Keep items that you use a lot in easy-to-reach places. If you need to reach something above you, use a strong step stool that has a grab bar. Keep electrical cords out of the way. Do not use floor polish or wax that makes floors slippery. If you must use  wax, use non-skid floor wax. Do not have throw rugs and other things on the floor that can make you trip. What can I do with my stairs? Do not leave any items on the stairs. Make sure that there are handrails on both sides of the stairs and use them. Fix handrails that are broken or loose. Make sure that handrails are as long as the stairways. Check any carpeting to make sure that it is firmly attached to the stairs. Fix any carpet that is loose or worn. Avoid having throw rugs at the top or bottom of the stairs. If you do have throw rugs, attach them to the floor with carpet tape. Make sure that you have a light switch at the top of the stairs and the bottom of the stairs. If you do not have them, ask someone to add them for you. What else can I do to help prevent falls? Wear shoes that: Do not have high heels. Have rubber bottoms. Are comfortable and fit you well. Are closed at the toe. Do not  wear sandals. If you use a stepladder: Make sure that it is fully opened. Do not climb a closed stepladder. Make sure that both sides of the stepladder are locked into place. Ask someone to hold it for you, if possible. Clearly mark and make sure that you can see: Any grab bars or handrails. First and last steps. Where the edge of each step is. Use tools that help you move around (mobility aids) if they are needed. These include: Canes. Walkers. Scooters. Crutches. Turn on the lights when you go into a dark area. Replace any light bulbs as soon as they burn out. Set up your furniture so you have a clear path. Avoid moving your furniture around. If any of your floors are uneven, fix them. If there are any pets around you, be aware of where they are. Review your medicines with your doctor. Some medicines can make you feel dizzy. This can increase your chance of falling. Ask your doctor what other things that you can do to help prevent falls. This information is not intended to replace advice given to you by your health care provider. Make sure you discuss any questions you have with your health care provider. Document Released: 05/03/2009 Document Revised: 12/13/2015 Document Reviewed: 08/11/2014 Elsevier Interactive Patient Education  2017 [employer].

## 2022-10-03 ENCOUNTER — Other Ambulatory Visit: Payer: Self-pay | Admitting: Cardiology

## 2022-10-03 DIAGNOSIS — I2510 Atherosclerotic heart disease of native coronary artery without angina pectoris: Secondary | ICD-10-CM

## 2022-10-08 DIAGNOSIS — H903 Sensorineural hearing loss, bilateral: Secondary | ICD-10-CM | POA: Diagnosis not present

## 2022-10-29 DIAGNOSIS — M9903 Segmental and somatic dysfunction of lumbar region: Secondary | ICD-10-CM | POA: Diagnosis not present

## 2022-10-29 DIAGNOSIS — M5441 Lumbago with sciatica, right side: Secondary | ICD-10-CM | POA: Diagnosis not present

## 2022-10-29 DIAGNOSIS — M9902 Segmental and somatic dysfunction of thoracic region: Secondary | ICD-10-CM | POA: Diagnosis not present

## 2022-10-29 DIAGNOSIS — M9905 Segmental and somatic dysfunction of pelvic region: Secondary | ICD-10-CM | POA: Diagnosis not present

## 2022-10-31 DIAGNOSIS — H43813 Vitreous degeneration, bilateral: Secondary | ICD-10-CM | POA: Diagnosis not present

## 2022-10-31 DIAGNOSIS — H10413 Chronic giant papillary conjunctivitis, bilateral: Secondary | ICD-10-CM | POA: Diagnosis not present

## 2022-10-31 DIAGNOSIS — G4730 Sleep apnea, unspecified: Secondary | ICD-10-CM | POA: Diagnosis not present

## 2022-10-31 DIAGNOSIS — H04123 Dry eye syndrome of bilateral lacrimal glands: Secondary | ICD-10-CM | POA: Diagnosis not present

## 2022-10-31 DIAGNOSIS — H2513 Age-related nuclear cataract, bilateral: Secondary | ICD-10-CM | POA: Diagnosis not present

## 2022-11-16 ENCOUNTER — Emergency Department (HOSPITAL_COMMUNITY)
Admission: EM | Admit: 2022-11-16 | Discharge: 2022-11-17 | Disposition: A | Discharge status: Home / Self Care | Payer: Medicare PPO | Attending: Emergency Medicine | Admitting: Emergency Medicine

## 2022-11-16 ENCOUNTER — Emergency Department (HOSPITAL_COMMUNITY): Payer: Medicare PPO

## 2022-11-16 ENCOUNTER — Other Ambulatory Visit: Payer: Self-pay

## 2022-11-16 DIAGNOSIS — S0181XA Laceration without foreign body of other part of head, initial encounter: Secondary | ICD-10-CM | POA: Diagnosis not present

## 2022-11-16 DIAGNOSIS — W01198A Fall on same level from slipping, tripping and stumbling with subsequent striking against other object, initial encounter: Secondary | ICD-10-CM | POA: Diagnosis not present

## 2022-11-16 DIAGNOSIS — Z7982 Long term (current) use of aspirin: Secondary | ICD-10-CM | POA: Insufficient documentation

## 2022-11-16 DIAGNOSIS — S199XXA Unspecified injury of neck, initial encounter: Secondary | ICD-10-CM | POA: Diagnosis not present

## 2022-11-16 DIAGNOSIS — R9431 Abnormal electrocardiogram [ECG] [EKG]: Secondary | ICD-10-CM | POA: Diagnosis not present

## 2022-11-16 DIAGNOSIS — S0990XA Unspecified injury of head, initial encounter: Secondary | ICD-10-CM | POA: Diagnosis not present

## 2022-11-16 DIAGNOSIS — I2510 Atherosclerotic heart disease of native coronary artery without angina pectoris: Secondary | ICD-10-CM | POA: Insufficient documentation

## 2022-11-16 DIAGNOSIS — Y9389 Activity, other specified: Secondary | ICD-10-CM | POA: Insufficient documentation

## 2022-11-16 LAB — CBC WITH DIFFERENTIAL/PLATELET
Abs Immature Granulocytes: 0.03 10*3/uL (ref 0.00–0.07)
Basophils Absolute: 0 10*3/uL (ref 0.0–0.1)
Basophils Relative: 0 %
Eosinophils Absolute: 0.2 10*3/uL (ref 0.0–0.5)
Eosinophils Relative: 3 %
HCT: 43.5 % (ref 39.0–52.0)
Hemoglobin: 14.9 g/dL (ref 13.0–17.0)
Immature Granulocytes: 1 %
Lymphocytes Relative: 35 %
Lymphs Abs: 1.9 10*3/uL (ref 0.7–4.0)
MCH: 32.3 pg (ref 26.0–34.0)
MCHC: 34.3 g/dL (ref 30.0–36.0)
MCV: 94.2 fL (ref 80.0–100.0)
Monocytes Absolute: 0.5 10*3/uL (ref 0.1–1.0)
Monocytes Relative: 9 %
Neutro Abs: 2.8 10*3/uL (ref 1.7–7.7)
Neutrophils Relative %: 52 %
Platelets: 196 10*3/uL (ref 150–400)
RBC: 4.62 MIL/uL (ref 4.22–5.81)
RDW: 12.1 % (ref 11.5–15.5)
WBC: 5.4 10*3/uL (ref 4.0–10.5)
nRBC: 0 % (ref 0.0–0.2)

## 2022-11-16 LAB — COMPREHENSIVE METABOLIC PANEL
ALT: 26 U/L (ref 0–44)
AST: 27 U/L (ref 15–41)
Albumin: 4 g/dL (ref 3.5–5.0)
Alkaline Phosphatase: 61 U/L (ref 38–126)
Anion gap: 12 (ref 5–15)
BUN: 15 mg/dL (ref 8–23)
CO2: 22 mmol/L (ref 22–32)
Calcium: 9.2 mg/dL (ref 8.9–10.3)
Chloride: 103 mmol/L (ref 98–111)
Creatinine, Ser: 1.15 mg/dL (ref 0.61–1.24)
GFR, Estimated: >60 mL/min (ref >60)
Glucose, Bld: 142 mg/dL — ABNORMAL HIGH (ref 70–99)
Potassium: 3.7 mmol/L (ref 3.5–5.1)
Sodium: 137 mmol/L (ref 135–145)
Total Bilirubin: 0.8 mg/dL (ref 0.3–1.2)
Total Protein: 6.5 g/dL (ref 6.5–8.1)

## 2022-11-16 LAB — TROPONIN I (HIGH SENSITIVITY): Troponin I (High Sensitivity): 12 ng/L (ref <18)

## 2022-11-16 LAB — PROTIME-INR
INR: 1 (ref 0.8–1.2)
Prothrombin Time: 13 seconds (ref 11.4–15.2)

## 2022-11-16 MED ORDER — LIDOCAINE HCL (PF) 1 % IJ SOLN
5.0000 mL | Freq: Once | INTRAMUSCULAR | Status: AC
  Administered 2022-11-16: 5 mL
  Filled 2022-11-16: qty 5

## 2022-11-16 NOTE — ED Triage Notes (Addendum)
Pt arrived POV with c/c of Level 2 hitting head on thinner. Per pt he was trying to catch his balance when he ran across kitchen and banged head on cabinet. Pt denies LOC. Pt is on brilinta. Pt has laceration to left forehead.

## 2022-11-16 NOTE — ED Notes (Signed)
X-ray at bedside

## 2022-11-16 NOTE — Progress Notes (Signed)
   11/16/22 2220  Spiritual Encounters  Type of Visit Initial  Care provided to: Patient;Family  Referral source Trauma page  Reason for visit Trauma  OnCall Visit Yes  Advance Directives (For Healthcare)  Does Patient Have a Medical Advance Directive? No;Yes;Unable to assess, patient is non-responsive or altered mental status  Type of Estate agent of [place];Living will   Ch responded to trauma call. Pt's wife was at bedside. Ch provided hospitality. No follow-up needed at this time.

## 2022-11-17 NOTE — ED Provider Notes (Signed)
[place] EMERGENCY DEPARTMENT AT [facility] Provider Note   CSN: [id number] Arrival date & time: 11/16/22  2211     History  Chief Complaint  Patient presents with   Level 2   Fall    Henry Rogers is a 72 y.o. male.  HPI   Patient with medical history including CAD, PAF, BPH, MI currently on Brilinta presenting after a fall.  Patient states that he was petting his dog denies he was leaning over and got back up he lost his balance causing him to fall forward into the cabinets.  He didn't actually fall to the ground, states he struck his head, there is no loss of conscious, he is not on anticoag currently on antiplatelets.  He denies any current headaches change in vision paresthesias or weakness the upper or lower extremities, does not endorse any chest pain shortness of breath, states that prior to this incident he was not feeling lightheaded or dizziness, nor was he experiencing chest pain or shortness of breath.  He states that he is just simply lost his balance.    Home Medications Prior to Admission medications   Medication Sig Start Date End Date Taking? Authorizing Provider  aspirin 81 MG tablet Take 81 mg by mouth daily.    [provider]  atorvastatin (LIPITOR) 80 MG tablet TAKE 1 TABLET(80 MG) BY MOUTH DAILY AT 6 PM 06/11/22   Ronnald Nian, MD  Melatonin 10 MG CAPS Take 10 mg by mouth at bedtime.    [provider]  metoprolol tartrate (LOPRESSOR) 25 MG tablet TAKE 1 TABLET(25 MG) BY MOUTH TWICE DAILY 06/11/22   Ronnald Nian, MD  Multiple Vitamin (MULTIVITAMIN) tablet Take 1 tablet by mouth every evening. 50 Plus    [provider]  nitroGLYCERIN (NITROSTAT) 0.4 MG SL tablet Place 1 tablet (0.4 mg total) under the tongue every 5 (five) minutes as needed for chest pain. MAX 3 doses 10/28/19   [place], Peter M, MD  Nutritional Supplements (JUICE PLUS FIBRE PO) Take 1 tablet by mouth 2 (two) times daily. Fruit blend and vegetable  blend    [provider]  omega-3 acid ethyl esters (LOVAZA) 1 g capsule TAKE 2 CAPSULES BY MOUTH TWICE DAILY 11/22/21   Ronnald Nian, MD  tadalafil (CIALIS) 5 MG tablet TAKE 1 TABLET(5 MG) BY MOUTH DAILY 06/11/22   Ronnald Nian, MD  ticagrelor (BRILINTA) 60 MG TABS tablet TAKE 1 TABLET(60 MG) BY MOUTH TWICE DAILY 10/03/22   [place], Peter M, MD  zolpidem (AMBIEN) 5 MG tablet TAKE 1 TABLET BY MOUTH AT BEDTIME AS NEEDED FOR SLEEP Patient not taking: Reported on 07/01/2022 07/08/16   Ronnald Nian, MD      Allergies    Ace inhibitors    Review of Systems   Review of Systems  Constitutional:  Negative for chills and fever.  Respiratory:  Negative for shortness of breath.   Cardiovascular:  Negative for chest pain.  Gastrointestinal:  Negative for abdominal pain.  Skin:  Positive for wound.  Neurological:  Negative for headaches.    Physical Exam Updated Vital Signs BP 130/84   Pulse 78   Temp (!) 97.5 F (36.4 C) (Temporal)   Resp 18   Ht 5\' 6"  (1.676 m)   Wt 86.2 kg   SpO2 95%   BMI 30.67 kg/m  Physical Exam Vitals and nursing note reviewed.  Constitutional:      General: He is not in acute  distress.    Appearance: He is not ill-appearing.  HENT:     Head: Normocephalic and atraumatic.     Comments: Patient has a L-shaped laceration noted on his forehead, the horizontal aspect is superficial nature, well-approximated, the vertical aspect was jagged, and gaping, but it was hemodynamically stable.  There is no noted raccoon eyes or Battle sign noted.    Nose: No congestion.     Mouth/Throat:     Mouth: Mucous membranes are moist.     Pharynx: Oropharynx is clear. No oropharyngeal exudate or posterior oropharyngeal erythema.     Comments: No trismus no torticollis no oral or oral trauma present. Eyes:     Extraocular Movements: Extraocular movements intact.     Conjunctiva/sclera: Conjunctivae normal.     Pupils: Pupils are equal, round, and reactive to light.   Cardiovascular:     Rate and Rhythm: Normal rate and regular rhythm.     Pulses: Normal pulses.     Heart sounds: No murmur heard.    No friction rub. No gallop.  Pulmonary:     Effort: No respiratory distress.     Breath sounds: No wheezing, rhonchi or rales.     Comments: There is no obvious deformity of the chest present chest was nontender to palpation lung sounds clear. Abdominal:     Palpations: Abdomen is soft.     Tenderness: There is no abdominal tenderness.     Comments: No obvious trauma of the abdomen it was soft nontender.  Musculoskeletal:     Comments: Spine was palpated, nontender palpation no step-off or deformities noted, no pelvis instability no leg shortening, moving his upper and lower extremities without difficulty.  Skin:    General: Skin is warm and dry.  Neurological:     Mental Status: He is alert.     Comments: No facial asymmetry no difficulty with word finding following two-step commands no unilateral  weakness present.  Psychiatric:        Mood and Affect: Mood normal.     ED Results / Procedures / Treatments   Labs (all labs ordered are listed, but only abnormal results are displayed) Labs Reviewed  COMPREHENSIVE METABOLIC PANEL - Abnormal; Notable for the following components:      Result Value   Glucose, Bld 142 (*)    All other components within normal limits  CBC WITH DIFFERENTIAL/PLATELET  PROTIME-INR  TROPONIN I (HIGH SENSITIVITY)    EKG None  Radiology CT Cervical Spine Wo Contrast  Result Date: 11/16/2022 CLINICAL DATA:  Neck trauma EXAM: CT CERVICAL SPINE WITHOUT CONTRAST TECHNIQUE: Multidetector CT imaging of the cervical spine was performed without intravenous contrast. Multiplanar CT image reconstructions were also generated. RADIATION DOSE REDUCTION: This exam was performed according to the departmental dose-optimization program which includes automated exposure control, adjustment of the mA and/or kV according to patient size  and/or use of iterative reconstruction technique. COMPARISON:  None Available. FINDINGS: Alignment: No subluxation.  Facet alignment is within normal limits. Skull base and vertebrae: No acute fracture. No primary bone lesion or focal pathologic process. Soft tissues and spinal canal: No prevertebral fluid or swelling. No visible canal hematoma. Disc levels: Multilevel degenerative change, worst at C6-C7 and C7-T1. Facet degenerative changes at multiple levels with foraminal narrowing Upper chest: Negative. Other: None IMPRESSION: Degenerative changes of the cervical spine. No acute osseous abnormality. Electronically Signed   By: Jasmine Pang M.D.   On: 11/16/2022 23:16   CT Head Wo Contrast  Result  Date: 11/16/2022 CLINICAL DATA:  Head trauma EXAM: CT HEAD WITHOUT CONTRAST TECHNIQUE: Contiguous axial images were obtained from the base of the skull through the vertex without intravenous contrast. RADIATION DOSE REDUCTION: This exam was performed according to the departmental dose-optimization program which includes automated exposure control, adjustment of the mA and/or kV according to patient size and/or use of iterative reconstruction technique. COMPARISON:  None Available. FINDINGS: Brain: No evidence of acute infarction, hemorrhage, hydrocephalus, extra-axial collection or mass lesion/mass effect. Vascular: Atherosclerotic calcifications are present within the cavernous internal carotid arteries. Skull: Normal. Negative for fracture or focal lesion. Sinuses/Orbits: No acute finding. Other: There is left frontal scalp soft tissue swelling and laceration. IMPRESSION: 1. No acute intracranial process. 2. Left frontal scalp soft tissue swelling and laceration. Electronically Signed   By: Darliss Cheney M.D.   On: 11/16/2022 23:11    Procedures .Marland KitchenLaceration Repair  Date/Time: 11/17/2022 2:14 AM  Performed by: Carroll Sage, PA-C Authorized by: Carroll Sage, PA-C   Consent:    Consent  obtained:  Verbal   Consent given by:  Patient   Risks, benefits, and alternatives were discussed: yes     Risks discussed:  Infection, pain, retained foreign body, poor cosmetic result, need for additional repair, poor wound healing and vascular damage   Alternatives discussed:  No treatment, delayed treatment, observation and referral Universal protocol:    Patient identity confirmed:  Verbally with patient Anesthesia:    Anesthesia method:  Local infiltration   Local anesthetic:  Lidocaine 1% w/o epi Laceration details:    Location:  Face   Face location:  Forehead   Length (cm):  3   Depth (mm):  2 Pre-procedure details:    Preparation:  Patient was prepped and draped in usual sterile fashion Exploration:    Limited defect created (wound extended): no     Imaging obtained: x-ray     Imaging outcome: foreign body not noted     Wound exploration: wound explored through full range of motion and entire depth of wound visualized     Contaminated: no   Treatment:    Area cleansed with:  Saline   Amount of cleaning:  Standard   Irrigation solution:  Sterile saline   Irrigation method:  Pressure wash Skin repair:    Repair method:  Sutures   Suture size:  6-0   Suture material:  Prolene   Suture technique:  Simple interrupted   Number of sutures:  6 Approximation:    Approximation:  Close Repair type:    Repair type:  Simple Post-procedure details:    Dressing:  Non-adherent dressing   Procedure completion:  Tolerated well, no immediate complications     Medications Ordered in ED Medications  lidocaine (PF) (XYLOCAINE) 1 % injection 5 mL (5 mLs Infiltration Given 11/16/22 2335)    ED Course/ Medical Decision Making/ A&P                             Medical Decision Making Risk Prescription drug management.   This patient presents to the ED for concern of fall, this involves an extensive number of treatment options, and is a complaint that carries with it a high  risk of complications and morbidity.  The differential diagnosis includes intracranial bleed, thoracic/abdominal trauma, orthopedic injury    Additional history obtained:  Additional history obtained from the wife at bedside External records from outside source obtained and reviewed including cardiology  notes   Co morbidities that complicate the patient evaluation  Antiplatelet therapy  Social Determinants of Health:  N/a    Lab Tests:  I Ordered, and personally interpreted labs.  The pertinent results include: CBC unremarkable, CMP reveals a glucose of 142, PT/INR unremarkable, troponin negative   Imaging Studies ordered:  I ordered imaging studies including CT head, C-spine I independently visualized and interpreted imaging which showed both acute findings I agree with the radiologist interpretation   Cardiac Monitoring:  The patient was maintained on a cardiac monitor.  I personally viewed and interpreted the cardiac monitored which showed an underlying rhythm of: Sinus without signs of ischemia   Medicines ordered and prescription drug management:  I ordered medication including lidocaine I have reviewed the patients home medicines and have made adjustments as needed  Critical Interventions:  N/a   Reevaluation:  Presents after a fall, has a laceration on his head, will obtain CT head C-spine, will also obtain basic lab workup.  Recommend suturing for improved wound healing, patient and wife are in agreement with this plan, we discussed deferring suturing and having patient follow-up with plastic surgery but explained that this would need to be closed within the next 24 hours and that is possible that they may not be able to be seen which could lead to worsening outcome of the scar, explained  I could close the wound but there could be abnormal scarring, bleeding, pain, infection, all questions were answered and they were in agreement with suturing.  We did  discuss using skin glue versus suturing but due to the gaping nature I explained that sutures would be the best way to close it, they are agreement this.   Consultations Obtained:  N/a    Test Considered:  Will defer on CT chest and abdomen as above suspicion for thoracic/abdominal trauma is low no noted trauma on my exam both were non nontender.    Rule out low suspicion for intracranial head bleed  no focal deficits present on my exam ct head is negative.  Low suspicion for spinal cord abnormality or spinal fracture spine was palpated was nontender to palpation, patient has full range of motion in the upper and lower extremities ct c spine is negative.  Low suspicion for ACS or arrhythmias as patient denies chest pain, shortness of breath, no hypoperfusion or fluid overload on exam, EKG sinus without signs troponin is negative.  low suspicion for systemic infection as patient is nontoxic-appearing, vital signs reassuring, no obvious source infection noted on exam.       Dispostion and problem list  After consideration of the diagnostic results and the patients response to treatment, I feel that the patent would benefit from discharge  Forehead laceration-will recommend basic wound care, different antibiotics his wound was thoroughly irrigated noncontaminated, open follow-up with PCP for removal of sutures.            Final Clinical Impression(s) / ED Diagnoses Final diagnoses:  Laceration of forehead, initial encounter    Rx / DC Orders ED Discharge Orders     None      Currently on antiplatelet   Carroll Sage, PA-C 11/17/22 Gladys Damme, April, MD 11/17/22 1610

## 2022-11-17 NOTE — Discharge Instructions (Signed)
Small laceration received 6 sutures, please refrain from getting wet for today, starting tomorrow please rinse out the wound and apply new dressings, do this twice daily.  May use over-the-counter pain medication as needed.  To help decrease scarring please stay out of direct sunlight for the first 6 weeks.  Follow-up next 5 days for suture removal either at this department urgent care or your primary care provider.  If you notice worsening redness swelling discharge or worsening pain in the area please come back in for reassessment  

## 2022-11-18 DIAGNOSIS — S0181XD Laceration without foreign body of other part of head, subsequent encounter: Secondary | ICD-10-CM | POA: Diagnosis not present

## 2022-11-18 DIAGNOSIS — L570 Actinic keratosis: Secondary | ICD-10-CM | POA: Diagnosis not present

## 2022-11-20 ENCOUNTER — Encounter: Payer: Self-pay | Admitting: Family Medicine

## 2022-11-20 ENCOUNTER — Ambulatory Visit: Payer: Medicare PPO | Admitting: Family Medicine

## 2022-11-20 VITALS — BP 120/76 | HR 83 | Temp 97.7°F | Resp 18 | Wt 188.6 lb

## 2022-11-20 DIAGNOSIS — S0181XA Laceration without foreign body of other part of head, initial encounter: Secondary | ICD-10-CM

## 2022-11-20 NOTE — Progress Notes (Signed)
   Subjective:    Patient ID: CHIMAOBI CASEBOLT, male    DOB: July 20, 1951, 72 y.o.   MRN: [id number]  HPI He sustained a forehead laceration on April 28 and was seen in the emergency room.  X-rays and scans were done because of his being on Brilinta.  He continued had bleeding from this and was seen by me at my home 2 days ago.  The laceration was still slight and leaking.  He has been using compression on it since then.  He has concerns over it today for continued difficulty   Review of Systems     Objective:   Physical Exam Exam of the left forehead shows the sutures to be in place.  When the bandage was removed it did cause a slight amount of leakage but none of any major concern.       Assessment & Plan:  Laceration of forehead, initial encounter I explained that the incision looks like it is healing nicely but did get some bacitracin ointment to use wanted to try and keep it from sticking to the packing.  He will return here on Tuesday for suture removal.

## 2022-11-25 ENCOUNTER — Ambulatory Visit (INDEPENDENT_AMBULATORY_CARE_PROVIDER_SITE_OTHER): Payer: Medicare PPO | Admitting: Family Medicine

## 2022-11-25 ENCOUNTER — Encounter: Payer: Self-pay | Admitting: Family Medicine

## 2022-11-25 VITALS — BP 110/68 | HR 84 | Temp 97.9°F | Resp 16 | Wt 187.0 lb

## 2022-11-25 DIAGNOSIS — S0181XA Laceration without foreign body of other part of head, initial encounter: Secondary | ICD-10-CM

## 2022-11-25 NOTE — Progress Notes (Signed)
   Subjective:    Patient ID: Henry Rogers, male    DOB: December 23, 1950, 72 y.o.   MRN: [id number]  HPI He is here for suture removal.  Initially he had some difficulty with bleeding but over the last several days has not had none.   Review of Systems     Objective:   Physical Exam Exam of the laceration shows that it is healing quite nicely with no evidence of infection.       Assessment & Plan:  Laceration of forehead, initial encounter The sutures were removed without difficulty.  No bleeding noted post removal of the sutures.

## 2022-11-26 ENCOUNTER — Telehealth: Payer: Self-pay

## 2022-11-26 NOTE — Telephone Encounter (Signed)
Transition Care Management Unsuccessful Follow-up Telephone Call  Date of discharge and from where:  11/17/2022 [facility]  Attempts:  1st Attempt  Reason for unsuccessful TCM follow-up call:  Left voice message  Henry Rogers [place]  [facility] Community Resource Care Guide   ??[email or web address]  ?? [phone]   Website: [email or web address]  [email or web address]      

## 2022-11-28 ENCOUNTER — Telehealth: Payer: Self-pay

## 2022-11-28 NOTE — Telephone Encounter (Signed)
Transition Care Management Unsuccessful Follow-up Telephone Call  Date of discharge and from where:  11/17/2022 [facility]  Attempts:  2nd Attempt  Reason for unsuccessful TCM follow-up call:  Left voice message  Henry Rogers [place]  [facility] Community Resource Care Guide   ??[email or web address]  ?? [phone]   Website: [email or web address]  [email or web address]      

## 2022-12-10 DIAGNOSIS — M9902 Segmental and somatic dysfunction of thoracic region: Secondary | ICD-10-CM | POA: Diagnosis not present

## 2022-12-10 DIAGNOSIS — M5441 Lumbago with sciatica, right side: Secondary | ICD-10-CM | POA: Diagnosis not present

## 2022-12-10 DIAGNOSIS — M9903 Segmental and somatic dysfunction of lumbar region: Secondary | ICD-10-CM | POA: Diagnosis not present

## 2022-12-10 DIAGNOSIS — M9905 Segmental and somatic dysfunction of pelvic region: Secondary | ICD-10-CM | POA: Diagnosis not present

## 2022-12-12 ENCOUNTER — Other Ambulatory Visit: Payer: Self-pay | Admitting: Family Medicine

## 2022-12-12 DIAGNOSIS — I2510 Atherosclerotic heart disease of native coronary artery without angina pectoris: Secondary | ICD-10-CM

## 2023-01-21 DIAGNOSIS — M5441 Lumbago with sciatica, right side: Secondary | ICD-10-CM | POA: Diagnosis not present

## 2023-01-21 DIAGNOSIS — M9903 Segmental and somatic dysfunction of lumbar region: Secondary | ICD-10-CM | POA: Diagnosis not present

## 2023-01-21 DIAGNOSIS — M9902 Segmental and somatic dysfunction of thoracic region: Secondary | ICD-10-CM | POA: Diagnosis not present

## 2023-01-21 DIAGNOSIS — M9905 Segmental and somatic dysfunction of pelvic region: Secondary | ICD-10-CM | POA: Diagnosis not present

## 2023-03-09 DIAGNOSIS — M5441 Lumbago with sciatica, right side: Secondary | ICD-10-CM | POA: Diagnosis not present

## 2023-03-09 DIAGNOSIS — M9902 Segmental and somatic dysfunction of thoracic region: Secondary | ICD-10-CM | POA: Diagnosis not present

## 2023-03-09 DIAGNOSIS — M9903 Segmental and somatic dysfunction of lumbar region: Secondary | ICD-10-CM | POA: Diagnosis not present

## 2023-03-09 DIAGNOSIS — M9905 Segmental and somatic dysfunction of pelvic region: Secondary | ICD-10-CM | POA: Diagnosis not present

## 2023-03-15 ENCOUNTER — Other Ambulatory Visit: Payer: Self-pay | Admitting: Family Medicine

## 2023-03-15 DIAGNOSIS — E785 Hyperlipidemia, unspecified: Secondary | ICD-10-CM

## 2023-03-15 DIAGNOSIS — N400 Enlarged prostate without lower urinary tract symptoms: Secondary | ICD-10-CM

## 2023-03-15 DIAGNOSIS — G4733 Obstructive sleep apnea (adult) (pediatric): Secondary | ICD-10-CM

## 2023-03-16 ENCOUNTER — Other Ambulatory Visit: Payer: Self-pay

## 2023-03-16 ENCOUNTER — Telehealth: Payer: Self-pay | Admitting: Family Medicine

## 2023-03-16 MED ORDER — OMEGA-3-ACID ETHYL ESTERS 1 G PO CAPS: 2.0000 | ORAL_CAPSULE | Freq: Two times a day (BID) | ORAL | 1 refills | Status: DC

## 2023-03-16 NOTE — Telephone Encounter (Signed)
Pt left message would like refills on his Omega 3 to [facility]

## 2023-03-16 NOTE — Telephone Encounter (Signed)
Those refills were in 2023 & he is now out

## 2023-03-16 NOTE — Telephone Encounter (Signed)
done

## 2023-03-31 ENCOUNTER — Telehealth: Payer: Self-pay | Admitting: Family Medicine

## 2023-03-31 NOTE — Telephone Encounter (Signed)
Henry Rogers would like his flu and and covid shot added to his chart. He got them both at [facility] on [address] today 03/31/23

## 2023-03-31 NOTE — Telephone Encounter (Signed)
Entered

## 2023-04-21 DIAGNOSIS — M9902 Segmental and somatic dysfunction of thoracic region: Secondary | ICD-10-CM | POA: Diagnosis not present

## 2023-04-21 DIAGNOSIS — M5441 Lumbago with sciatica, right side: Secondary | ICD-10-CM | POA: Diagnosis not present

## 2023-04-21 DIAGNOSIS — M9903 Segmental and somatic dysfunction of lumbar region: Secondary | ICD-10-CM | POA: Diagnosis not present

## 2023-04-21 DIAGNOSIS — M9905 Segmental and somatic dysfunction of pelvic region: Secondary | ICD-10-CM | POA: Diagnosis not present

## 2023-04-23 ENCOUNTER — Encounter: Payer: Self-pay | Admitting: Family Medicine

## 2023-04-23 ENCOUNTER — Telehealth: Payer: Medicare PPO | Admitting: Family Medicine

## 2023-04-23 VITALS — Ht 66.0 in | Wt 185.0 lb

## 2023-04-23 DIAGNOSIS — R051 Acute cough: Secondary | ICD-10-CM

## 2023-04-23 MED ORDER — BENZONATATE 100 MG PO CAPS
200.0000 mg | ORAL_CAPSULE | Freq: Three times a day (TID) | ORAL | 0 refills | Status: DC | PRN
Start: 2023-04-23 — End: 2023-05-01

## 2023-04-23 NOTE — Progress Notes (Signed)
Subjective:    Patient ID: Henry Rogers, male    DOB: October 25, 1950, 72 y.o.   MRN: [id number]  HPI Documentation for virtual audio and video telecommunications through Caregility encounter: The patient was located at home. 2 patient identifiers used.  The provider was located in the office. The patient did consent to this visit and is aware of possible charges through their insurance for this visit. The other persons participating in this telemedicine service were none. Time spent on call was 5 minutes and in review of previous records >20 minutes total for counseling and coordination of care. This virtual service is not related to other E/M service within previous 7 days.  He states that roughly 5 days ago he developed a cough that has been slowly getting worse.  No fever, chills, sore throat, shortness of breath, earache.  He did a test for COVID yesterday and it was negative.  He has been using OTC meds including NyQuil and DayQuil with some benefit.   Review of Systems     Objective:    Physical Exam Alert and in no distress.  His breathing pattern appears normal.       Assessment & Plan:   Acute cough - Plan: benzonatate (TESSALON) 100 MG capsule I explained that since his COVID test is negative and the only symptom he is having is coughing, this get more aggressive with treating it.  Continue with DayQuil and NyQuil at night but also add Tessalon to that.  If that does not work he is to call me.  He was comfortable with that.

## 2023-05-01 ENCOUNTER — Encounter: Payer: Self-pay | Admitting: Nurse Practitioner

## 2023-05-01 ENCOUNTER — Telehealth (INDEPENDENT_AMBULATORY_CARE_PROVIDER_SITE_OTHER): Payer: Medicare PPO | Admitting: Nurse Practitioner

## 2023-05-01 VITALS — HR 88 | Temp 97.7°F | Wt 185.0 lb

## 2023-05-01 DIAGNOSIS — R054 Cough syncope: Secondary | ICD-10-CM

## 2023-05-01 DIAGNOSIS — R55 Syncope and collapse: Secondary | ICD-10-CM

## 2023-05-01 MED ORDER — AZITHROMYCIN 250 MG PO TABS
ORAL_TABLET | ORAL | 0 refills | Status: AC
Start: 2023-05-01 — End: 2023-05-06

## 2023-05-01 MED ORDER — HYDROCODONE BIT-HOMATROP MBR 5-1.5 MG/5ML PO SOLN
5.0000 mL | Freq: Three times a day (TID) | ORAL | 0 refills | Status: DC | PRN
Start: 2023-05-01 — End: 2023-05-13

## 2023-05-01 NOTE — Progress Notes (Signed)
Virtual Visit Encounter mychart visit.   I connected with  [name] on 05/18/23 at  3:30 PM EDT by secure video and audio telemedicine application. I verified that I am speaking with the correct person using two identifiers.   I introduced myself as a Publishing rights manager with the practice. The limitations of evaluation and management by telemedicine discussed with the patient and the availability of in person appointments. The patient expressed verbal understanding and consent to proceed.  Participating parties in this visit include: Myself and patient  The patient is: Patient Location: Home I am: Provider Location: Office/Clinic Subjective:    CC and HPI: Henry Rogers is a 72 y.o. year old male presenting for follow up of cough.  History of Present Illness Mr. Meschke presents with a persistent cough of two weeks duration. The cough is characterized by paroxysms, leading to episodes of uncontrollable coughing. Despite treatment with benzonatate, the cough persists, albeit with some improvement. The patient also reports a recent episode of syncope following a severe coughing fit, during which he lost consciousness for approximately 5-10 seconds. This was the second such episode, the first occurring in March when he experienced a similar loss of consciousness after bending over for an extended period.  The patient also reports ongoing chest congestion, which he describes as being located in the upper chest. Despite the persistence of these symptoms, the patient denies any chest pain or palpitations. His daughter, a nurse, recently listened to his chest and did not identify any abnormal sounds. The patient has no known allergies to medications, including codeine.   Past medical history, Surgical history, Family history not pertinant except as noted below, Social history, Allergies, and medications have been entered into the medical record, reviewed, and corrections made.   Review of  Systems:  All review of systems negative except what is listed in the HPI  Objective:    Alert and oriented x 4 Speaking in clear sentences with no shortness of breath. No distress.  Impression and Recommendations:    Problem List Items Addressed This Visit     Cough syncope - Primary    Cough for two weeks with paroxysms, improved with Benzonatate but still present. No signs of lower respiratory infection on auscultation by nurse daughter. Episode of syncope following a coughing fit, likely due to a vasovagal reaction. No chest pain or palpitations upon regaining consciousness. History of similar episode in the past. -Prescribe antibiotic as a precautionary measure. -Prescribe cough medicine with codeine to suppress cough. -Recommend close monitoring and repeat evaluation if syncopal episodes recur.        orders and follow up as documented in EMR I discussed the assessment and treatment plan with the patient. The patient was provided an opportunity to ask questions and all were answered. The patient agreed with the plan and demonstrated an understanding of the instructions.   The patient was advised to call back or seek an in-person evaluation if the symptoms worsen or if the condition fails to improve as anticipated.  Follow-Up: prn  I provided 20 minutes of non-face-to-face interaction with this non face-to-face encounter including intake, same-day documentation, and chart review.   Tollie Eth, NP , DNP, AGNP-c [facility]

## 2023-05-06 NOTE — Progress Notes (Signed)
Cardiology Office Note:    Date:  05/13/2023   ID:  FUAD HIGGINS, DOB 07/16/1951, MRN [id number]  PCP:  Ronnald Nian, MD  Cardiologist:  Zechariah Bissonnette [place], MD  Electrophysiologist:  None   Referring MD: Ronnald Nian, MD   Chief Complaint  Patient presents with   Coronary Artery Disease     History of Present Illness:    Henry Rogers is a 72 y.o. male with a hx of HLD, OSA, chronic diastolic heart failure and CAD.  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  He also had a cardiogenic shock.  RCA was stented with a 5 x 16 mm Veriflex stent postdilated to 6 mm.  EF was 45 to 50%.  The other coronary vessels did not have significant disease.  Repeat echocardiogram in September 2015 showed LV function was normalized to 55 to 60%.  He was readmitted in October 2016 with recurrent inferior infarction again associated with cardiogenic shock.  The stent in the mid RCA was patent however distal RCA was occluded was heavy thrombus.  This was treated with balloon angioplasty.  He had a transient episode of paroxysmal atrial fibrillation after the cardiac catheterization, this was resolved after IV amiodarone and has not recurred since.  He is not on any systemic anticoagulation due to the brief episodes without recurrence. He is on chronic DAPT.  No chest pain or dyspnea. Stays active. Has had a congestive cough for 3 weeks but this is almost resolved.  No bleeding. No palpitations or dizziness.   He reports an episode in April - he was bending over to pet his dog. When he stood up felt things swaying and took several steps across kitchen and fell into cabinets hitting head. Was seen in ED and had laceration of scalp. Labs and CT head and neck were ok. About 2 weeks ago he was sitting watching TV and eating when he apparently blacked out briefly and dropped tray on the floor. Did not fall. Brief period when he was not  aware of things around him.       Past Medical History:  Diagnosis Date   BPH (benign prostatic hyperplasia)    CAD (coronary artery disease)    a. inf STEMI s/p BMS to mRCA (02/03/14)  b. inf STEMI s/p PCTA to RCA with slow reflow.   Chronic diastolic CHF (congestive heart failure) (HCC)    a. 2D echo on 05/18/15 LVEF 55-60%, akinesis basal inferior wall, G2DD     GERD (gastroesophageal reflux disease)    Hearing loss    Hemorrhoids    HH (hiatus hernia)    Ischemic cardiomyopathy    a. ECHO 01/2014 EF 45-50% and akinesis of the entire inferior myocardium. Mild MR.   b. now resolved (EF 55-60% on 04/22/15)     Metabolic syndrome    Myocardial infarction (HCC) 2015   Heart attack in 2015 and 2017   OSA (obstructive sleep apnea)    a. mild not requiring CPAP at this time   PAF (paroxysmal atrial fibrillation) (HCC)    a. brief episode after cardiac cath on 05/16/15- resolved after IV amiodarone.   Tinnitus    Varicose veins     Past Surgical History:  Procedure Laterality Date   CARDIAC CATHETERIZATION N/A 05/16/2015   Procedure: Left Heart Cath and Coronary Angiography;  Surgeon: Lyn Records, MD;  Location: [facility] INVASIVE CV LAB;  Service: Cardiovascular;  Laterality: N/A;   CARDIAC CATHETERIZATION N/A 05/16/2015   Procedure: Coronary Stent Intervention;  Surgeon: Lyn Records, MD;  Location: [facility] INVASIVE CV LAB;  Service: Cardiovascular;  Laterality: N/A;   CARPAL TUNNEL RELEASE Bilateral 03/02/2015   Procedure: BILATERAL CARPAL TUNNEL RELEASE ;  Surgeon: Cindee Salt, MD;  Location: [facility];  Service: Orthopedics;  Laterality: Bilateral;   COLONOSCOPY  07/21/2004   Dr. Elnoria Howard   CORONARY ANGIOPLASTY WITH STENT PLACEMENT  02/02/2014   BMS to RCA   ENDOVENOUS ABLATION SAPHENOUS VEIN W/ LASER Right 10/12/2014   EVLA right greater saphenous vein by Gretta Began MD   ESOPHAGEAL DILATION  07/23/2021   Procedure: ESOPHAGEAL DILATION;  Surgeon: Shellia Cleverly, DO;   Location: [facility] OR;  Service: Endoscopy;;   ESOPHAGOGASTRODUODENOSCOPY N/A 07/23/2021   Procedure: ESOPHAGOGASTRODUODENOSCOPY (EGD);  Surgeon: Shellia Cleverly, DO;  Location: [facility] OR;  Service: Endoscopy;  Laterality: N/A;   HIATAL HERNIA REPAIR N/A 07/23/2021   Procedure: LAPAROSCOPIC REPAIR OF HIATAL HERNIA, LAPOROSCOPIC  TRANSVERSE ABDOMINIS PLANE (TAP) BLOCK ;  Surgeon: Gaynelle Adu, MD;  Location[facility] OR;  Service: General;  Laterality: N/A;   LEFT HEART CATHETERIZATION WITH CORONARY ANGIOGRAM N/A 02/02/2014   Procedure: LEFT HEART CATHETERIZATION WITH CORONARY ANGIOGRAM;  Surgeon: Quindarius Cabello M [place], MD;  Location: [facility] CATH LAB;  Service: Cardiovascular;  Laterality: N/A;   SKIN BIOPSY Left 06/19/2021   seborrheic keratosis   stab phlebectomy  Right 03/15/2015   stab phlebectomy > 20 incisions (right leg) by Gretta Began MD   TRANSORAL INCISIONLESS FUNDOPLICATION N/A 07/23/2021   Procedure: TRANSORAL INCISIONLESS FUNDOPLICATION;  Surgeon: Shellia Cleverly, DO;  Location: [facility] OR;  Service: Endoscopy;  Laterality: N/A;    Current Medications: Current Meds  Medication Sig   aspirin 81 MG tablet Take 81 mg by mouth daily.   atorvastatin (LIPITOR) 80 MG tablet TAKE 1 TABLET(80 MG) BY MOUTH DAILY AT 6 PM   metoprolol succinate (TOPROL XL) 25 MG 24 hr tablet Take 1 tablet (25 mg total) by mouth daily.   Multiple Vitamin (MULTIVITAMIN) tablet Take 1 tablet by mouth every evening. 50 Plus   nitroGLYCERIN (NITROSTAT) 0.4 MG SL tablet Place 1 tablet (0.4 mg total) under the tongue every 5 (five) minutes as needed for chest pain. MAX 3 doses   Nutritional Supplements (JUICE PLUS FIBRE PO) Take 1 tablet by mouth 2 (two) times daily. Fruit blend and vegetable blend   omega-3 acid ethyl esters (LOVAZA) 1 g capsule Take 2 capsules (2 g total) by mouth 2 (two) times daily.   tadalafil (CIALIS) 5 MG tablet TAKE 1 TABLET(5 MG) BY MOUTH DAILY   ticagrelor (BRILINTA) 60 MG TABS tablet TAKE 1 TABLET(60 MG) BY MOUTH TWICE  DAILY   zolpidem (AMBIEN) 5 MG tablet TAKE 1 TABLET BY MOUTH AT BEDTIME AS NEEDED FOR SLEEP   [DISCONTINUED] metoprolol tartrate (LOPRESSOR) 25 MG tablet TAKE 1 TABLET(25 MG) BY MOUTH TWICE DAILY     Allergies:   Ace inhibitors   Social History   Socioeconomic History   Marital status: Married    Spouse name: Not on file   Number of children: Not on file   Years of education: Not on file   Highest education level: Not on file  Occupational History   Not on file  Tobacco Use   Smoking status: Former   Smokeless tobacco: Never  Vaping Use   Vaping status: Never Used  Substance and Sexual Activity   Alcohol use: Yes  Alcohol/week: 3.0 standard drinks of alcohol    Types: 3 Glasses of wine per week   Drug use: No   Sexual activity: Not Currently  Other Topics Concern   Not on file  Social History Narrative   Not on file   Social Determinants of Health   Financial Resource Strain: Low Risk  (09/26/2022)   Overall Financial Resource Strain (CARDIA)    Difficulty of Paying Living Expenses: Not hard at all  Food Insecurity: No Food Insecurity (09/30/2022)   Hunger Vital Sign    Worried About Running Out of Food in the Last Year: Never true    Ran Out of Food in the Last Year: Never true  Transportation Needs: No Transportation Needs (09/30/2022)   PRAPARE - Administrator, Civil Service (Medical): No    Lack of Transportation (Non-Medical): No  Physical Activity: Insufficiently Active (09/26/2022)   Exercise Vital Sign    Days of Exercise per Week: 6 days    Minutes of Exercise per Session: 20 min  Stress: No Stress Concern Present (09/26/2022)   [employer] of Occupational Health - Occupational Stress Questionnaire    Feeling of Stress : Only a little  Social Connections: Unknown (09/26/2022)   Social Connection and Isolation Panel [NHANES]    Frequency of Communication with Friends and Family: More than three times a week    Frequency of Social Gatherings  with Friends and Family: Once a week    Attends Religious Services: Not on Marketing executive or Organizations: Yes    Attends Banker Meetings: 1 to 4 times per year    Marital Status: Married     Family History: The patient's family history includes Breast cancer in his mother; Cervical cancer in his mother; Heart disease in his brother; Hypertension in his mother; Pancreatic cancer in his brother; Skin cancer in his mother. There is no history of Colon cancer or Esophageal cancer.  ROS:   Please see the history of present illness.     All other systems reviewed and are negative.  EKGs/Labs/Other Studies Reviewed:    The following studies were reviewed today:  Cath 05/16/2015 Mid RCA to Dist RCA lesion, 10% stenosed. The lesion was previously treated with a stent (unknown type). Prox RCA to Mid RCA lesion, 25% stenosed. Prox LAD to Dist LAD lesion, 40% stenosed. Dist RCA lesion, 100% stenosed. Post intervention, there is a 10% residual stenosis.   Acute inferior myocardial infarction presenting with cardiogenic shock. PTCA of the distal RCA from 100% to less than 10%. The vessel is large and ectatic containing heavy thrombus. Reperfusion was complicated by severe "no reflow". Widely patent left coronary system. Intra-aortic balloon pump to improve diastolic flow and help resolve "no reflow". Left ventriculography was not performed     RECOMMENDATIONS:   One-to-one intra-aortic balloon, pulsation for at least 24 hours. Intravenous Levophed to keep a mean arterial pressure greater than 65 mmHg. Intravenous Aggrastat for at least 24 hours Aspirin and Brilinta indefinitely. IV amiodarone to treat acute atrial fibrillation. Prognosis is guarded. This was discussed with the patient's family in full detail. IV heparin without a bolus to start in 8 hours. Beta blocker therapy as tolerated by blood pressure. 2-D Doppler echocardiogram 24-48 hours  hence. Unfortunately, the patient will probably have a rocky post infarct course with potential for brady- and ventricular arrhythmias.  EKG:  EKG is not ordered today.       Recent  Labs: 11/16/2022: ALT 26; BUN 15; Creatinine, Ser 1.15; Hemoglobin 14.9; Platelets 196; Potassium 3.7; Sodium 137  Recent Lipid Panel    Component Value Date/Time   CHOL 121 05/21/2022 1015   TRIG 74 05/21/2022 1015   HDL 44 05/21/2022 1015   CHOLHDL 2.8 05/21/2022 1015   CHOLHDL 2.9 02/19/2017 0843   VLDL 15 02/19/2017 0843   LDLCALC 62 05/21/2022 1015    Physical Exam:    VS:  BP 126/88   Pulse 71   Ht 5\' 6"  (1.676 m)   Wt 184 lb 9.6 oz (83.7 kg)   SpO2 96%   BMI 29.80 kg/m     Wt Readings from Last 3 Encounters:  05/13/23 184 lb 9.6 oz (83.7 kg)  05/01/23 185 lb (83.9 kg)  04/23/23 185 lb (83.9 kg)     GEN:  Well nourished, well developed in no acute distress HEENT: Normal NECK: No JVD; No carotid bruits LYMPHATICS: No lymphadenopathy CARDIAC: RRR, no murmurs, rubs, gallops RESPIRATORY:  Clear to auscultation without rales, wheezing or rhonchi  ABDOMEN: Soft, non-tender, non-distended MUSCULOSKELETAL:  No edema; No deformity  SKIN: Warm and dry NEUROLOGIC:  Alert and oriented x 3 PSYCHIATRIC:  Normal affect   ASSESSMENT:    1. Coronary artery disease involving native coronary artery of native heart without angina pectoris   2. Hyperlipidemia LDL goal <70   3. Syncope and collapse        PLAN:    In order of problems listed above:  CAD: s/p inferior MI x 2. Aneurysmal RCA. Denies any recent anginal symptoms.  Continue aspirin, Brilinta, Lipitor and metoprolol.   Hyperlipidemia: at goal. LDL 62.  Continue statin. Plans complete lab work in March Syncope. Etiology unclear. 2 different episodes 6 months apart. Recommend changing dose of metoprolol to 25 mg succinate daily. If any recurrent episodes he is to let me know. Would need to consider event monitor versus  ILR    Follow up in 6 months   Medication Adjustments/Labs and Tests Ordered: Current medicines are reviewed at length with the patient today.  Concerns regarding medicines are outlined above.  No orders of the defined types were placed in this encounter.   Meds ordered this encounter  Medications   metoprolol succinate (TOPROL XL) 25 MG 24 hr tablet    Sig: Take 1 tablet (25 mg total) by mouth daily.    Dispense:  90 tablet    Refill:  3     Patient Instructions  Medication Instructions:  *If you need a refill on your cardiac medications before your next appointment, please call your pharmacy*   Lab Work:  If you have labs (blood work) drawn today and your tests are completely normal, you will receive your results only by: MyChart Message (if you have MyChart) OR A paper copy in the mail If you have any lab test that is abnormal or we need to change your treatment, we will call you to review the results.   Testing/Procedures:    Follow-Up: At [facility], you and your health needs are our priority.  As part of our continuing mission to provide you with exceptional heart care, we have created designated Provider Care Teams.  These Care Teams include your primary Cardiologist (physician) and Advanced Practice Providers (APPs -  Physician Assistants and Nurse Practitioners) who all work together to provide you with the care you need, when you need it.  We recommend signing up for the patient portal called "MyChart".  Sign up information is provided on this After Visit Summary.  MyChart is used to connect with patients for Virtual Visits (Telemedicine).  Patients are able to view lab/test results, encounter notes, upcoming appointments, etc.  Non-urgent messages can be sent to your provider as well.   To learn more about what you can do with MyChart, go to [email or web address].    Your next appointment:      Provider:       Other Instructions      Signed, De Libman [place], MD  05/13/2023 10:09 AM    [facility]

## 2023-05-13 ENCOUNTER — Ambulatory Visit: Payer: Medicare PPO | Attending: Cardiology | Admitting: Cardiology

## 2023-05-13 ENCOUNTER — Encounter: Payer: Self-pay | Admitting: Cardiology

## 2023-05-13 VITALS — BP 126/88 | HR 71 | Ht 66.0 in | Wt 184.6 lb

## 2023-05-13 DIAGNOSIS — E785 Hyperlipidemia, unspecified: Secondary | ICD-10-CM

## 2023-05-13 DIAGNOSIS — R55 Syncope and collapse: Secondary | ICD-10-CM | POA: Diagnosis not present

## 2023-05-13 DIAGNOSIS — I2510 Atherosclerotic heart disease of native coronary artery without angina pectoris: Secondary | ICD-10-CM | POA: Diagnosis not present

## 2023-05-13 MED ORDER — METOPROLOL SUCCINATE ER 25 MG PO TB24: 25.0000 mg | ORAL_TABLET | Freq: Every day | ORAL | 3 refills | Status: DC

## 2023-05-13 NOTE — Patient Instructions (Addendum)
Medication Instructions:  Stop Metoprolol Tartrate Start Metoprolol Succinate  25 mg tablet daily Continue all current medication  *If you need a refill on your cardiac medications before your next appointment, please call your pharmacy*   Lab Work: none   Testing/Procedures: none   Follow-Up: At [facility], you and your health needs are our priority.  As part of our continuing mission to provide you with exceptional heart care, we have created designated Provider Care Teams.  These Care Teams include your primary Cardiologist (physician) and Advanced Practice Providers (APPs -  Physician Assistants and Nurse Practitioners) who all work together to provide you with the care you need, when you need it.   Your next appointment:  1 +year.. Please call office in April 2025 to schedule apt for Oct 2025    Provider: Dr. [place]

## 2023-05-15 ENCOUNTER — Telehealth: Payer: Self-pay | Admitting: Family Medicine

## 2023-05-15 DIAGNOSIS — R051 Acute cough: Secondary | ICD-10-CM

## 2023-05-15 MED ORDER — HYDROCODONE BIT-HOMATROP MBR 5-1.5 MG/5ML PO SOLN
5.0000 mL | Freq: Three times a day (TID) | ORAL | 0 refills | Status: DC | PRN
Start: 2023-05-15 — End: 2023-12-15

## 2023-05-15 NOTE — Telephone Encounter (Signed)
Pt called and states he has ran out of the cough syrup you prescribed and it did help but recently the cough has returned. He states he was up for 3 hours coughing last night and wanted too know if you had further recommendations.

## 2023-05-15 NOTE — Telephone Encounter (Signed)
I sent that in for him. We may need to have Dr. Elbert Ewings reassess him if the cough continues.

## 2023-05-18 DIAGNOSIS — R054 Cough syncope: Secondary | ICD-10-CM | POA: Insufficient documentation

## 2023-05-18 NOTE — Assessment & Plan Note (Signed)
Cough for two weeks with paroxysms, improved with Benzonatate but still present. No signs of lower respiratory infection on auscultation by nurse daughter. Episode of syncope following a coughing fit, likely due to a vasovagal reaction. No chest pain or palpitations upon regaining consciousness. History of similar episode in the past. -Prescribe antibiotic as a precautionary measure. -Prescribe cough medicine with codeine to suppress cough. -Recommend close monitoring and repeat evaluation if syncopal episodes recur.

## 2023-05-22 ENCOUNTER — Telehealth: Payer: Self-pay | Admitting: Cardiology

## 2023-05-22 NOTE — Telephone Encounter (Signed)
*  STAT* If patient is at the pharmacy, call can be transferred to refill team.   1. Which medications need to be refilled? (please list name of each medication and dose if known)   nitroGLYCERIN (NITROSTAT) 0.4 MG SL tablet   2. Would you like to learn more about the convenience, safety, & potential cost savings by using the [facility]?    3. Are you open to using the [facility] (Type [facility]. ).  4. Which pharmacy/location (including street and city if local pharmacy) is medication to be sent to?  [id number] - [place], [place] - [address] AT [facility] OF [address]   5. Do they need a 30 day or 90 day supply?   Patient is completely out of this medication.

## 2023-05-25 MED ORDER — NITROGLYCERIN 0.4 MG SL SUBL: 0.4000 mg | SUBLINGUAL_TABLET | SUBLINGUAL | 11 refills | Status: AC | PRN

## 2023-05-30 ENCOUNTER — Encounter: Payer: Self-pay | Admitting: Family Medicine

## 2023-06-02 DIAGNOSIS — M5441 Lumbago with sciatica, right side: Secondary | ICD-10-CM | POA: Diagnosis not present

## 2023-06-02 DIAGNOSIS — M9903 Segmental and somatic dysfunction of lumbar region: Secondary | ICD-10-CM | POA: Diagnosis not present

## 2023-06-02 DIAGNOSIS — M9905 Segmental and somatic dysfunction of pelvic region: Secondary | ICD-10-CM | POA: Diagnosis not present

## 2023-06-02 DIAGNOSIS — M9902 Segmental and somatic dysfunction of thoracic region: Secondary | ICD-10-CM | POA: Diagnosis not present

## 2023-06-23 ENCOUNTER — Other Ambulatory Visit: Payer: Self-pay | Admitting: Family Medicine

## 2023-06-23 DIAGNOSIS — I2510 Atherosclerotic heart disease of native coronary artery without angina pectoris: Secondary | ICD-10-CM

## 2023-06-24 ENCOUNTER — Other Ambulatory Visit: Payer: Self-pay | Admitting: Family Medicine

## 2023-06-24 DIAGNOSIS — G4733 Obstructive sleep apnea (adult) (pediatric): Secondary | ICD-10-CM

## 2023-06-24 DIAGNOSIS — N400 Enlarged prostate without lower urinary tract symptoms: Secondary | ICD-10-CM

## 2023-06-30 ENCOUNTER — Telehealth: Payer: Self-pay | Admitting: Family Medicine

## 2023-06-30 DIAGNOSIS — E785 Hyperlipidemia, unspecified: Secondary | ICD-10-CM

## 2023-06-30 MED ORDER — ATORVASTATIN CALCIUM 80 MG PO TABS
ORAL_TABLET | ORAL | 0 refills | Status: DC
Start: 2023-06-30 — End: 2023-09-07

## 2023-06-30 NOTE — Telephone Encounter (Signed)
Done

## 2023-06-30 NOTE — Telephone Encounter (Signed)
Needs refill on atorvastatin  [facility]

## 2023-07-03 DIAGNOSIS — M5441 Lumbago with sciatica, right side: Secondary | ICD-10-CM | POA: Diagnosis not present

## 2023-07-03 DIAGNOSIS — M9903 Segmental and somatic dysfunction of lumbar region: Secondary | ICD-10-CM | POA: Diagnosis not present

## 2023-07-03 DIAGNOSIS — M9902 Segmental and somatic dysfunction of thoracic region: Secondary | ICD-10-CM | POA: Diagnosis not present

## 2023-07-03 DIAGNOSIS — M9905 Segmental and somatic dysfunction of pelvic region: Secondary | ICD-10-CM | POA: Diagnosis not present

## 2023-07-13 ENCOUNTER — Telehealth: Payer: Self-pay | Admitting: Family Medicine

## 2023-07-13 NOTE — Telephone Encounter (Signed)
Pt left voicemail requesting Ambien refill , last prescribed in 2017 and he has just completed rx And would like refill. He likes to keep them on hand

## 2023-07-15 MED ORDER — ZOLPIDEM TARTRATE 5 MG PO TABS: 5.0000 mg | ORAL_TABLET | Freq: Every evening | ORAL | 0 refills | Status: DC | PRN

## 2023-07-15 MED ORDER — ZOLPIDEM TARTRATE 5 MG PO TABS: 5.0000 mg | ORAL_TABLET | Freq: Every evening | ORAL | 0 refills | Status: AC | PRN

## 2023-08-08 ENCOUNTER — Encounter: Payer: Self-pay | Admitting: Family Medicine

## 2023-08-17 DIAGNOSIS — M9903 Segmental and somatic dysfunction of lumbar region: Secondary | ICD-10-CM | POA: Diagnosis not present

## 2023-08-17 DIAGNOSIS — M9902 Segmental and somatic dysfunction of thoracic region: Secondary | ICD-10-CM | POA: Diagnosis not present

## 2023-08-17 DIAGNOSIS — M9905 Segmental and somatic dysfunction of pelvic region: Secondary | ICD-10-CM | POA: Diagnosis not present

## 2023-08-17 DIAGNOSIS — M5441 Lumbago with sciatica, right side: Secondary | ICD-10-CM | POA: Diagnosis not present

## 2023-09-02 DIAGNOSIS — Z808 Family history of malignant neoplasm of other organs or systems: Secondary | ICD-10-CM | POA: Diagnosis not present

## 2023-09-02 DIAGNOSIS — L578 Other skin changes due to chronic exposure to nonionizing radiation: Secondary | ICD-10-CM | POA: Diagnosis not present

## 2023-09-02 DIAGNOSIS — D179 Benign lipomatous neoplasm, unspecified: Secondary | ICD-10-CM | POA: Diagnosis not present

## 2023-09-02 DIAGNOSIS — Z86018 Personal history of other benign neoplasm: Secondary | ICD-10-CM | POA: Diagnosis not present

## 2023-09-02 DIAGNOSIS — D2230 Melanocytic nevi of unspecified part of face: Secondary | ICD-10-CM | POA: Diagnosis not present

## 2023-09-02 DIAGNOSIS — D2272 Melanocytic nevi of left lower limb, including hip: Secondary | ICD-10-CM | POA: Diagnosis not present

## 2023-09-02 DIAGNOSIS — D2262 Melanocytic nevi of left upper limb, including shoulder: Secondary | ICD-10-CM | POA: Diagnosis not present

## 2023-09-02 DIAGNOSIS — D225 Melanocytic nevi of trunk: Secondary | ICD-10-CM | POA: Diagnosis not present

## 2023-09-02 DIAGNOSIS — D235 Other benign neoplasm of skin of trunk: Secondary | ICD-10-CM | POA: Diagnosis not present

## 2023-09-05 ENCOUNTER — Other Ambulatory Visit: Payer: Self-pay | Admitting: Family Medicine

## 2023-09-05 DIAGNOSIS — E785 Hyperlipidemia, unspecified: Secondary | ICD-10-CM

## 2023-09-07 ENCOUNTER — Telehealth: Payer: Self-pay

## 2023-09-07 ENCOUNTER — Encounter: Payer: Self-pay | Admitting: Internal Medicine

## 2023-09-07 ENCOUNTER — Other Ambulatory Visit (HOSPITAL_COMMUNITY): Payer: Self-pay

## 2023-09-07 NOTE — Telephone Encounter (Signed)
Pharmacy Patient Advocate Encounter   Received notification from CoverMyMeds that prior authorization for Tadalafil 5MG  tablets is required/requested.   Insurance verification completed.   The patient is insured through [employer] .   Per test claim: PA required; PA submitted to above mentioned insurance via CoverMyMeds Key/confirmation #/EOC (Key: [id number])      Status is pending

## 2023-09-08 ENCOUNTER — Other Ambulatory Visit (HOSPITAL_COMMUNITY): Payer: Self-pay

## 2023-09-08 NOTE — Telephone Encounter (Signed)
Pharmacy Patient Advocate Encounter  Received notification from [facility] that Prior Authorization for Tadalafil 5MG  tablets has been APPROVED from 1.1.25 to 12.31.25. Ran test claim, Copay is $16.66. This test claim was processed through [facility]- copay amounts may vary at other pharmacies due to pharmacy/plan contracts, or as the patient moves through the different stages of their insurance plan.   PA #/Case ID/Reference #: (Key: [id number])

## 2023-09-09 ENCOUNTER — Ambulatory Visit: Payer: Medicare PPO | Admitting: Family Medicine

## 2023-09-11 ENCOUNTER — Ambulatory Visit: Payer: Medicare PPO | Admitting: Family Medicine

## 2023-09-11 ENCOUNTER — Encounter: Payer: Self-pay | Admitting: Family Medicine

## 2023-09-11 VITALS — BP 126/82 | HR 67 | Ht 66.5 in | Wt 186.0 lb

## 2023-09-11 DIAGNOSIS — I2510 Atherosclerotic heart disease of native coronary artery without angina pectoris: Secondary | ICD-10-CM

## 2023-09-11 DIAGNOSIS — E785 Hyperlipidemia, unspecified: Secondary | ICD-10-CM | POA: Diagnosis not present

## 2023-09-11 DIAGNOSIS — I480 Paroxysmal atrial fibrillation: Secondary | ICD-10-CM

## 2023-09-11 DIAGNOSIS — K219 Gastro-esophageal reflux disease without esophagitis: Secondary | ICD-10-CM

## 2023-09-11 DIAGNOSIS — Z0000 Encounter for general adult medical examination without abnormal findings: Secondary | ICD-10-CM

## 2023-09-11 DIAGNOSIS — R7303 Prediabetes: Secondary | ICD-10-CM

## 2023-09-11 DIAGNOSIS — L570 Actinic keratosis: Secondary | ICD-10-CM

## 2023-09-11 DIAGNOSIS — N400 Enlarged prostate without lower urinary tract symptoms: Secondary | ICD-10-CM

## 2023-09-11 DIAGNOSIS — N5201 Erectile dysfunction due to arterial insufficiency: Secondary | ICD-10-CM

## 2023-09-11 DIAGNOSIS — Z9889 Other specified postprocedural states: Secondary | ICD-10-CM

## 2023-09-11 DIAGNOSIS — G4733 Obstructive sleep apnea (adult) (pediatric): Secondary | ICD-10-CM | POA: Diagnosis not present

## 2023-09-11 DIAGNOSIS — H9113 Presbycusis, bilateral: Secondary | ICD-10-CM

## 2023-09-11 DIAGNOSIS — G8929 Other chronic pain: Secondary | ICD-10-CM

## 2023-09-11 DIAGNOSIS — I5032 Chronic diastolic (congestive) heart failure: Secondary | ICD-10-CM

## 2023-09-11 DIAGNOSIS — M549 Dorsalgia, unspecified: Secondary | ICD-10-CM

## 2023-09-11 LAB — LIPID PANEL

## 2023-09-11 LAB — POCT GLYCOSYLATED HEMOGLOBIN (HGB A1C): Hemoglobin A1C: 5.7 % — AB (ref 4.0–5.6)

## 2023-09-11 MED ORDER — OMEGA-3-ACID ETHYL ESTERS 1 G PO CAPS
2.0000 | ORAL_CAPSULE | Freq: Two times a day (BID) | ORAL | 1 refills | Status: AC
Start: 2023-09-11 — End: ?

## 2023-09-11 MED ORDER — ATORVASTATIN CALCIUM 80 MG PO TABS
ORAL_TABLET | ORAL | 3 refills | Status: AC
Start: 2023-09-11 — End: ?

## 2023-09-11 NOTE — Progress Notes (Signed)
Complete physical exam  Patient: Henry Rogers   DOB: [birth year]   74 y.o. Male  MRN: [id number]  Subjective:    Chief Complaint  Patient presents with   Annual Exam    Fasting.    Body Itching     All over. Almost constant. Mostly back.    DMETRIUS AMBS is a 73 y.o. male who presents today for a complete physical exam. He reports consuming a general diet. Home exercise routine includes walking 3.5  hrs per week. He generally feels well. He reports sleeping fairly well. He does complain of itching especially on his back and forearms.  He did see dermatology recently but no particular recommendations were given.  He does use lotion on this.  He follows up regularly with cardiology.  He has had no trouble with chest pain, shortness of breath, PND or DOE he is using a topical melatonin in preparation to help with sleep.  He does have underlying OSA but has not been on CPAP.  He has a history of a Nissen fundoplication to help with his GERD and things are going much better.  He does see a chiropractor which is helping with his thoracic and cervical discomfort.  He does do exercises on a regular basis.  He continues on Cialis to help with his BPH and states that he can definitely tell a difference.  It however does not help with erectile dysfunction however at this point that does not seem to be a major issue.  He continues to wear hearing aids.  Most recent fall risk assessment:    09/11/2023   10:20 AM  Fall Risk   Falls in the past year? 1  Number falls in past yr: 0  Comment May 2024  Injury with Fall? 1  Comment Forehead     Most recent depression screenings:    09/11/2023   10:20 AM 09/30/2022   10:32 AM  PHQ 2/9 Scores  PHQ - 2 Score 0 0    Vision:Within last year and Dental: No current dental problems and Last dental visit: September 2024    Patient Care Team: Ronnald Nian, MD as PCP - General (Family Medicine) [place], Peter M, MD as PCP - Cardiology  (Cardiology) [place], Peter M, MD as Consulting Physician (Cardiology)   Outpatient Medications Prior to Visit  Medication Sig   aspirin 81 MG tablet Take 81 mg by mouth daily.   atorvastatin (LIPITOR) 80 MG tablet TAKE 1 TABLET(80 MG) BY MOUTH DAILY AT 6 PM   metoprolol succinate (TOPROL XL) 25 MG 24 hr tablet Take 1 tablet (25 mg total) by mouth daily.   Multiple Vitamin (MULTIVITAMIN) tablet Take 1 tablet by mouth every evening. 50 Plus   nitroGLYCERIN (NITROSTAT) 0.4 MG SL tablet Place 1 tablet (0.4 mg total) under the tongue every 5 (five) minutes as needed for chest pain. MAX 3 doses   Nutritional Supplements (JUICE PLUS FIBRE PO) Take 1 tablet by mouth 2 (two) times daily. Fruit blend and vegetable blend   omega-3 acid ethyl esters (LOVAZA) 1 g capsule Take 2 capsules (2 g total) by mouth 2 (two) times daily.   tadalafil (CIALIS) 5 MG tablet TAKE 1 TABLET(5 MG) BY MOUTH DAILY   ticagrelor (BRILINTA) 60 MG TABS tablet TAKE 1 TABLET(60 MG) BY MOUTH TWICE DAILY   zolpidem (AMBIEN) 5 MG tablet Take 1 tablet (5 mg total) by mouth at bedtime as needed. for sleep   HYDROcodone bit-homatropine (HYCODAN) 5-1.5 MG/5ML  syrup Take 5 mLs by mouth every 8 (eight) hours as needed for cough. (Patient not taking: Reported on 09/11/2023)   No facility-administered medications prior to visit.    Review of Systems  All other systems reviewed and are negative.         Objective:     BP 126/82   Pulse 67   Ht 5' 6.5" (1.689 m)   Wt 186 lb (84.4 kg)   SpO2 98%   BMI 29.57 kg/m    Physical Exam  Alert and in no distress. Tympanic membranes and canals are normal. Pharyngeal area is normal. Neck is supple without adenopathy or thyromegaly. Cardiac exam shows a regular sinus rhythm without murmurs or gallops. Lungs are clear to auscultation. Hemoglobin A1c is 5.7     Assessment & Plan:     Routine general medical examination at a health care facility  Coronary artery disease involving  native coronary artery of native heart without angina pectoris - Plan: omega-3 acid ethyl esters (LOVAZA) 1 g capsule  Chronic diastolic CHF (congestive heart failure) (HCC) - Plan: CBC with Differential/Platelet, Comprehensive metabolic panel, Lipid panel  Erectile dysfunction due to arterial insufficiency  Gastroesophageal reflux disease without esophagitis  History of fundoplication  Hyperlipidemia LDL goal <70 - Plan: Lipid panel, atorvastatin (LIPITOR) 80 MG tablet, omega-3 acid ethyl esters (LOVAZA) 1 g capsule  OSA (obstructive sleep apnea)  Presbycusis of both ears  Benign prostatic hyperplasia without lower urinary tract symptoms  Pre-diabetes - Plan: POCT glycosylated hemoglobin (Hb A1C), CBC with Differential/Platelet, Comprehensive metabolic panel  Chronic upper back pain  Immunization History  Administered Date(s) Administered   Fluad Quad(high Dose 65+) 03/24/2019, 03/31/2023   Hepatitis A, Adult 03/14/2021, 09/19/2021   Influenza Split 05/24/2013   Influenza, High Dose Seasonal PF 04/12/2014, 09/04/2015, 04/03/2018, 03/12/2020, 04/02/2022, 04/02/2022   Influenza, Seasonal, Injecte, Preservative Fre 03/24/2019   Influenza-Unspecified 04/07/2016, 04/01/2017, 06/01/2017, 07/10/2018, 04/18/2021   PFIZER Comirnaty(Gray Top)Covid-19 Tri-Sucrose Vaccine 11/20/2020, 04/09/2022   PFIZER(Purple Top)SARS-COV-2 Vaccination 08/27/2019, 09/21/2019, 04/07/2020   Pfizer Covid-19 Vaccine Bivalent Booster 54yrs & up 04/18/2021, 11/22/2021   Pfizer(Comirnaty)Fall Seasonal Vaccine 12 years and older 03/31/2023   Pneumococcal Conjugate-13 01/03/2015   Pneumococcal Polysaccharide-23 01/16/2009   RSV IGIV 04/02/2022   Rsv, Bivalent, Protein Subunit Rsvpref,pf (Abrysvo) 04/02/2022   Tdap 01/16/2009, 07/10/2018   Typhoid Live 04/03/2021   Zoster Recombinant(Shingrix) 04/01/2017, 06/01/2017   Zoster, Live 01/16/2009    Health Maintenance  Topic Date Due   COVID-19 Vaccine (9 -  2024-25 season) 05/26/2023   Lung Cancer Screening  07/11/2023   Medicare Annual Wellness (AWV)  09/30/2023   Colonoscopy  03/28/2025   DTaP/Tdap/Td (3 - Td or Tdap) 07/10/2028   INFLUENZA VACCINE  Completed   Hepatitis C Screening  Completed   Zoster Vaccines- Shingrix  Completed   HPV VACCINES  Aged Out   Pneumonia Vaccine 19+ Years old  Discontinued    Discussed showing up left often in the middle of the monitor to help reduce the dryness to the skin.  Also recommend possibly trying Tagamet and/or Claritin to help with the itching.  He will continue with physical therapy and manipulation to help with his upper back and neck discomfort.  Discussed the hemoglobin A1c of 5.7 at this point no further intervention needed.  Problem List Items Addressed This Visit     Actinic keratosis   BPH (benign prostatic hyperplasia)   CAD (coronary artery disease)   Chronic diastolic CHF (congestive heart failure) (HCC)  ED (erectile dysfunction)   Gastroesophageal reflux disease without esophagitis   History of fundoplication   Hyperlipidemia LDL goal <70   OSA (obstructive sleep apnea)   PAF (paroxysmal atrial fibrillation) (HCC)   Presbycusis of both ears   Other Visit Diagnoses       Routine general medical examination at a health care facility    -  Primary     Pre-diabetes          Follow-up 1 year   Sharlot Gowda, MD

## 2023-09-12 ENCOUNTER — Encounter: Payer: Self-pay | Admitting: Family Medicine

## 2023-09-12 LAB — CBC WITH DIFFERENTIAL/PLATELET
Basophils Absolute: 0 10*3/uL (ref 0.0–0.2)
Basos: 0 %
EOS (ABSOLUTE): 0.1 10*3/uL (ref 0.0–0.4)
Eos: 2 %
Hematocrit: 47.6 % (ref 37.5–51.0)
Hemoglobin: 16.1 g/dL (ref 13.0–17.7)
Immature Grans (Abs): 0 10*3/uL (ref 0.0–0.1)
Immature Granulocytes: 0 %
Lymphocytes Absolute: 1.6 10*3/uL (ref 0.7–3.1)
Lymphs: 32 %
MCH: 33 pg (ref 26.6–33.0)
MCHC: 33.8 g/dL (ref 31.5–35.7)
MCV: 98 fL — ABNORMAL HIGH (ref 79–97)
Monocytes Absolute: 0.4 10*3/uL (ref 0.1–0.9)
Monocytes: 8 %
Neutrophils Absolute: 3 10*3/uL (ref 1.4–7.0)
Neutrophils: 58 %
Platelets: 167 10*3/uL (ref 150–450)
RBC: 4.88 x10E6/uL (ref 4.14–5.80)
RDW: 12.9 % (ref 11.6–15.4)
WBC: 5.1 10*3/uL (ref 3.4–10.8)

## 2023-09-12 LAB — COMPREHENSIVE METABOLIC PANEL
ALT: 25 [IU]/L (ref 0–44)
AST: 23 [IU]/L (ref 0–40)
Albumin: 4.6 g/dL (ref 3.8–4.8)
Alkaline Phosphatase: 71 [IU]/L (ref 44–121)
BUN/Creatinine Ratio: 16 (ref 10–24)
BUN: 17 mg/dL (ref 8–27)
Bilirubin Total: 0.8 mg/dL (ref 0.0–1.2)
CO2: 23 mmol/L (ref 20–29)
Calcium: 9.6 mg/dL (ref 8.6–10.2)
Chloride: 100 mmol/L (ref 96–106)
Creatinine, Ser: 1.08 mg/dL (ref 0.76–1.27)
Globulin, Total: 2 g/dL (ref 1.5–4.5)
Glucose: 102 mg/dL — ABNORMAL HIGH (ref 70–99)
Potassium: 4.5 mmol/L (ref 3.5–5.2)
Sodium: 138 mmol/L (ref 134–144)
Total Protein: 6.6 g/dL (ref 6.0–8.5)
eGFR: 72 mL/min/{1.73_m2} (ref >59)

## 2023-09-12 LAB — LIPID PANEL
Chol/HDL Ratio: 2.4 {ratio} (ref 0.0–5.0)
Cholesterol, Total: 132 mg/dL (ref 100–199)
HDL: 56 mg/dL (ref >39)
LDL Chol Calc (NIH): 62 mg/dL (ref 0–99)
Triglycerides: 70 mg/dL (ref 0–149)
VLDL Cholesterol Cal: 14 mg/dL (ref 5–40)

## 2023-09-15 ENCOUNTER — Encounter: Payer: Self-pay | Admitting: Internal Medicine

## 2023-09-17 ENCOUNTER — Other Ambulatory Visit: Payer: Self-pay | Admitting: Cardiology

## 2023-09-17 DIAGNOSIS — I2510 Atherosclerotic heart disease of native coronary artery without angina pectoris: Secondary | ICD-10-CM

## 2023-09-23 DIAGNOSIS — M9905 Segmental and somatic dysfunction of pelvic region: Secondary | ICD-10-CM | POA: Diagnosis not present

## 2023-09-23 DIAGNOSIS — M9903 Segmental and somatic dysfunction of lumbar region: Secondary | ICD-10-CM | POA: Diagnosis not present

## 2023-09-23 DIAGNOSIS — M9902 Segmental and somatic dysfunction of thoracic region: Secondary | ICD-10-CM | POA: Diagnosis not present

## 2023-09-23 DIAGNOSIS — M5441 Lumbago with sciatica, right side: Secondary | ICD-10-CM | POA: Diagnosis not present

## 2023-10-06 ENCOUNTER — Ambulatory Visit: Payer: Medicare PPO

## 2023-10-06 DIAGNOSIS — Z0000 Encounter for general adult medical examination without abnormal findings: Secondary | ICD-10-CM | POA: Diagnosis not present

## 2023-10-06 NOTE — Progress Notes (Signed)
 Subjective:   Henry Rogers is a 73 y.o. who presents for a Medicare Wellness preventive visit.  Visit Complete: Virtual I connected with  Henry Rogers on 10/06/23 by a audio enabled telemedicine application and verified that I am speaking with the correct person using two identifiers.  Patient Location: Home  Provider Location: Office/Clinic  I discussed the limitations of evaluation and management by telemedicine. The patient expressed understanding and agreed to proceed.  Vital Signs: Because this visit was a virtual/telehealth visit, some criteria may be missing or patient reported. Any vitals not documented were not able to be obtained and vitals that have been documented are patient reported.  VideoError- Librarian, academic were attempted between this provider and patient, however failed, due to patient having technical difficulties OR patient did not have access to video capability.  We continued and completed visit with audio only.   Persons Participating in Visit: n/a  AWV Questionnaire: Yes: Patient Medicare AWV questionnaire was completed by the patient on 10/03/2023; I have confirmed that all information answered by patient is correct and no changes since this date.  Cardiac Risk Factors include: advanced age (>30men, >29 women);dyslipidemia;male gender     Objective:    Today's Vitals   There is no height or weight on file to calculate BMI.     10/06/2023   10:12 AM 11/16/2022   10:20 PM 09/30/2022   10:31 AM 09/27/2021    9:50 AM 09/17/2021   10:47 AM 07/17/2021   10:25 AM 03/14/2021    8:40 AM  Advanced Directives  Does Patient Have a Medical Advance Directive? Yes No;Yes;Unable to assess, patient is non-responsive or altered mental status Yes Yes Yes Yes Yes  Type of Advance Directive Out of facility DNR (pink MOST or yellow form) Healthcare Power of [place];Living will Out of facility DNR (pink MOST or yellow form) Out of facility  DNR (pink MOST or yellow form)  Healthcare Power of [place];Living will Living will;Healthcare Power of Attorney  Does patient want to make changes to medical advance directive?      No - Patient declined No - Patient declined  Copy of Healthcare Power of Attorney in Chart?      No - copy requested Yes - validated most recent copy scanned in chart (See row information)    Current Medications (verified) Outpatient Encounter Medications as of 10/06/2023  Medication Sig   aspirin 81 MG tablet Take 81 mg by mouth daily.   atorvastatin (LIPITOR) 80 MG tablet TAKE 1 TABLET(80 MG) BY MOUTH DAILY AT 6 PM   BRILINTA 60 MG TABS tablet TAKE 1 TABLET(60 MG) BY MOUTH TWICE DAILY   metoprolol succinate (TOPROL XL) 25 MG 24 hr tablet Take 1 tablet (25 mg total) by mouth daily.   Multiple Vitamin (MULTIVITAMIN) tablet Take 1 tablet by mouth every evening. 50 Plus   nitroGLYCERIN (NITROSTAT) 0.4 MG SL tablet Place 1 tablet (0.4 mg total) under the tongue every 5 (five) minutes as needed for chest pain. MAX 3 doses   Nutritional Supplements (JUICE PLUS FIBRE PO) Take 1 tablet by mouth 2 (two) times daily. Fruit blend and vegetable blend   omega-3 acid ethyl esters (LOVAZA) 1 g capsule Take 2 capsules (2 g total) by mouth 2 (two) times daily.   tadalafil (CIALIS) 5 MG tablet TAKE 1 TABLET(5 MG) BY MOUTH DAILY   zolpidem (AMBIEN) 5 MG tablet Take 1 tablet (5 mg total) by mouth at bedtime as needed. for  sleep   HYDROcodone bit-homatropine (HYCODAN) 5-1.5 MG/5ML syrup Take 5 mLs by mouth every 8 (eight) hours as needed for cough. (Patient not taking: Reported on 10/06/2023)   No facility-administered encounter medications on file as of 10/06/2023.    Allergies (verified) Ace inhibitors   History: Past Medical History:  Diagnosis Date   BPH (benign prostatic hyperplasia)    CAD (coronary artery disease)    a. inf STEMI s/p BMS to mRCA (02/03/14)  b. inf STEMI s/p PCTA to RCA with slow reflow.   Chronic  diastolic CHF (congestive heart failure) (HCC)    a. 2D echo on 05/18/15 LVEF 55-60%, akinesis basal inferior wall, G2DD     GERD (gastroesophageal reflux disease)    Hearing loss    Hemorrhoids    HH (hiatus hernia)    Ischemic cardiomyopathy    a. ECHO 01/2014 EF 45-50% and akinesis of the entire inferior myocardium. Mild MR.   b. now resolved (EF 55-60% on 04/22/15)     Metabolic syndrome    Myocardial infarction (HCC) 2015   Heart attack in 2015 and 2017   OSA (obstructive sleep apnea)    a. mild not requiring CPAP at this time   PAF (paroxysmal atrial fibrillation) (HCC)    a. brief episode after cardiac cath on 05/16/15- resolved after IV amiodarone.   Sleep apnea    Tinnitus    Varicose veins    Past Surgical History:  Procedure Laterality Date   CARDIAC CATHETERIZATION N/A 05/16/2015   Procedure: Left Heart Cath and Coronary Angiography;  Surgeon: Lyn Records, MD;  Location: [facility] INVASIVE CV LAB;  Service: Cardiovascular;  Laterality: N/A;   CARDIAC CATHETERIZATION N/A 05/16/2015   Procedure: Coronary Stent Intervention;  Surgeon: Lyn Records, MD;  Location: [facility] INVASIVE CV LAB;  Service: Cardiovascular;  Laterality: N/A;   CARPAL TUNNEL RELEASE Bilateral 03/02/2015   Procedure: BILATERAL CARPAL TUNNEL RELEASE ;  Surgeon: Cindee Salt, MD;  Location: [facility];  Service: Orthopedics;  Laterality: Bilateral;   COLONOSCOPY  07/21/2004   Dr. Elnoria Howard   CORONARY ANGIOPLASTY WITH STENT PLACEMENT  02/02/2014   BMS to RCA   ENDOVENOUS ABLATION SAPHENOUS VEIN W/ LASER Right 10/12/2014   EVLA right greater saphenous vein by Gretta Began MD   ESOPHAGEAL DILATION  07/23/2021   Procedure: ESOPHAGEAL DILATION;  Surgeon: Shellia Cleverly, DO;  Location: [facility] OR;  Service: Endoscopy;;   ESOPHAGOGASTRODUODENOSCOPY N/A 07/23/2021   Procedure: ESOPHAGOGASTRODUODENOSCOPY (EGD);  Surgeon: Shellia Cleverly, DO;  Location: [facility] OR;  Service: Endoscopy;  Laterality: N/A;   HERNIA REPAIR   1953   Also 2023   HIATAL HERNIA REPAIR N/A 07/23/2021   Procedure: LAPAROSCOPIC REPAIR OF HIATAL HERNIA, LAPOROSCOPIC  TRANSVERSE ABDOMINIS PLANE (TAP) BLOCK ;  Surgeon: Gaynelle Adu, MD;  [facility];  Service: General;  Laterality: N/A;   LEFT HEART CATHETERIZATION WITH CORONARY ANGIOGRAM N/A 02/02/2014   Procedure: LEFT HEART CATHETERIZATION WITH CORONARY ANGIOGRAM;  Surgeon: Peter M Swaziland, MD;  Location: [facility] CATH LAB;  Service: Cardiovascular;  Laterality: N/A;   SKIN BIOPSY Left 06/19/2021   seborrheic keratosis   stab phlebectomy  Right 03/15/2015   stab phlebectomy > 20 incisions (right leg) by Gretta Began MD   TRANSORAL INCISIONLESS FUNDOPLICATION N/A 07/23/2021   Procedure: TRANSORAL INCISIONLESS FUNDOPLICATION;  Surgeon: Shellia Cleverly, DO;  Location: [facility] OR;  Service: Endoscopy;  Laterality: N/A;   Family History  Problem Relation Age of Onset   Hypertension Mother  Cervical cancer Mother    Breast cancer Mother    Skin cancer Mother    Cancer Mother    Hearing loss Father    Pancreatic cancer Brother    Heart disease Brother    Cancer Brother    Colon cancer Neg Hx    Esophageal cancer Neg Hx    Social History   Socioeconomic History   Marital status: Married    Spouse name: Not on file   Number of children: Not on file   Years of education: Not on file   Highest education level: Professional school degree (e.g., MD, DDS, DVM, JD)  Occupational History   Not on file  Tobacco Use   Smoking status: Former   Smokeless tobacco: Never  Vaping Use   Vaping status: Never Used  Substance and Sexual Activity   Alcohol use: Yes    Alcohol/week: 3.0 standard drinks of alcohol    Types: 3 Glasses of wine per week   Drug use: No   Sexual activity: Not Currently  Other Topics Concern   Not on file  Social History Narrative   Not on file   Social Drivers of Health   Financial Resource Strain: Low Risk  (10/06/2023)   Overall Financial Resource Strain  (CARDIA)    Difficulty of Paying Living Expenses: Not hard at all  Food Insecurity: No Food Insecurity (10/06/2023)   Hunger Vital Sign    Worried About Running Out of Food in the Last Year: Never true    Ran Out of Food in the Last Year: Never true  Transportation Needs: No Transportation Needs (10/06/2023)   PRAPARE - Administrator, Civil Service (Medical): No    Lack of Transportation (Non-Medical): No  Physical Activity: Sufficiently Active (10/06/2023)   Exercise Vital Sign    Days of Exercise per Week: 7 days    Minutes of Exercise per Session: 30 min  Stress: No Stress Concern Present (10/06/2023)   [employer] of Occupational Health - Occupational Stress Questionnaire    Feeling of Stress : Not at all  Social Connections: Socially Integrated (10/06/2023)   Social Connection and Isolation Panel [NHANES]    Frequency of Communication with Friends and Family: More than three times a week    Frequency of Social Gatherings with Friends and Family: Three times a week    Attends Religious Services: 1 to 4 times per year    Active Member of Clubs or Organizations: Yes    Attends Engineer, structural: More than 4 times per year    Marital Status: Married    Tobacco Counseling Counseling given: Not Answered    Clinical Intake:  Pre-visit preparation completed: Yes  Pain : No/denies pain     Nutritional Risks: None Diabetes: No  How often do you need to have someone help you when you read instructions, pamphlets, or other written materials from your doctor or pharmacy?: 1 - Never  Interpreter Needed?: No  Information entered by :: [employer] N   Activities of Daily Living     10/03/2023    8:25 AM  In your present state of health, do you have any difficulty performing the following activities:  Hearing? 1  Comment has hearing aids  Vision? 0  Difficulty concentrating or making decisions? 0  Walking or climbing stairs? 0  Dressing or  bathing? 0  Doing errands, shopping? 0  Preparing Food and eating ? N  Using the Toilet? N  In the  past six months, have you accidently leaked urine? N  Do you have problems with loss of bowel control? N  Managing your Medications? N  Managing your Finances? N  Housekeeping or managing your Housekeeping? N    Patient Care Team: Ronnald Nian, MD as PCP - General (Family Medicine) [place], Peter M, MD as PCP - Cardiology (Cardiology) [place], Peter M, MD as Consulting Physician (Cardiology)  Indicate any recent Medical Services you may have received from other than [facility] providers in the past year (date may be approximate).     Assessment:   This is a routine wellness examination for Jeson.  Hearing/Vision screen Hearing Screening - Comments:: Has hearing aids that are maiantained Vision Screening - Comments:: Regular eye exams, [facility]   Goals Addressed             This Visit's Progress    Patient Stated       10/06/2023, stay alive       Depression Screen     10/06/2023   10:15 AM 09/11/2023   10:20 AM 09/30/2022   10:32 AM 07/01/2022   10:36 AM 09/27/2021    9:51 AM 03/14/2021    8:41 AM 03/13/2020   11:12 AM  PHQ 2/9 Scores  PHQ - 2 Score 0 0 0 0 0 0 0  PHQ- 9 Score 0          Fall Risk     10/03/2023    8:25 AM 09/11/2023   10:20 AM 09/26/2022    8:49 AM 07/01/2022   10:35 AM 09/27/2021    9:51 AM  Fall Risk   Falls in the past year? 1 1 0 0 0  Comment fainted      Number falls in past yr: 0 0 0 0   Comment  May 2024     Injury with Fall? 1 1 0 0   Comment  Forehead     Risk for fall due to : Medication side effect  Medication side effect No Fall Risks Medication side effect  Follow up Falls prevention discussed;Falls evaluation completed  Falls prevention discussed;Education provided;Falls evaluation completed Falls evaluation completed Falls evaluation completed;Education provided;Falls prevention discussed    MEDICARE RISK AT HOME:  Medicare  Risk at Home Any stairs in or around the home?: (Patient-Rptd) Yes If so, are there any without handrails?: (Patient-Rptd) No Home free of loose throw rugs in walkways, pet beds, electrical cords, etc?: (Patient-Rptd) Yes Adequate lighting in your home to reduce risk of falls?: (Patient-Rptd) Yes Life alert?: (Patient-Rptd) No Use of a cane, walker or w/c?: (Patient-Rptd) No Grab bars in the bathroom?: (Patient-Rptd) No Shower chair or bench in shower?: (Patient-Rptd) No Elevated toilet seat or a handicapped toilet?: (Patient-Rptd) No  TIMED UP AND GO:  Was the test performed?  No  Cognitive Function: 6CIT completed        10/06/2023   10:16 AM 09/30/2022   10:33 AM  6CIT Screen  What Year? 0 points 0 points  What month? 0 points 0 points  What time? 0 points 0 points  Count back from 20 0 points 0 points  Months in reverse 0 points 0 points  Repeat phrase 2 points 0 points  Total Score 2 points 0 points    Immunizations Immunization History  Administered Date(s) Administered   Fluad Quad(high Dose 65+) 03/24/2019, 03/31/2023   Hepatitis A, Adult 03/14/2021, 09/19/2021   Influenza Split 05/24/2013   Influenza, High Dose Seasonal PF 04/12/2014,  09/04/2015, 04/03/2018, 03/12/2020, 04/02/2022, 04/02/2022   Influenza, Seasonal, Injecte, Preservative Fre 03/24/2019   Influenza-Unspecified 04/07/2016, 04/01/2017, 06/01/2017, 07/10/2018, 04/18/2021   PFIZER Comirnaty(Gray Top)Covid-19 Tri-Sucrose Vaccine 11/20/2020, 04/09/2022   PFIZER(Purple Top)SARS-COV-2 Vaccination 08/27/2019, 09/21/2019, 04/07/2020   Pfizer Covid-19 Vaccine Bivalent Booster 60yrs & up 04/18/2021, 11/22/2021   Pfizer(Comirnaty)Fall Seasonal Vaccine 12 years and older 03/31/2023   Pneumococcal Conjugate-13 01/03/2015   Pneumococcal Polysaccharide-23 01/16/2009   RSV IGIV 04/02/2022   Rsv, Bivalent, Protein Subunit Rsvpref,pf Verdis Frederickson) 04/02/2022   Tdap 01/16/2009, 07/10/2018   Typhoid Live 04/03/2021    Zoster Recombinant(Shingrix) 04/01/2017, 06/01/2017   Zoster, Live 01/16/2009    Screening Tests Health Maintenance  Topic Date Due   Lung Cancer Screening  07/11/2023   COVID-19 Vaccine (9 - Pfizer risk 2024-25 season) 09/28/2023   Medicare Annual Wellness (AWV)  10/05/2024   Colonoscopy  03/28/2025   DTaP/Tdap/Td (3 - Td or Tdap) 07/10/2028   INFLUENZA VACCINE  Completed   Hepatitis C Screening  Completed   Zoster Vaccines- Shingrix  Completed   HPV VACCINES  Aged Out   Pneumonia Vaccine 36+ Years old  Discontinued    Health Maintenance  Health Maintenance Due  Topic Date Due   Lung Cancer Screening  07/11/2023   COVID-19 Vaccine (9 - Pfizer risk 2024-25 season) 09/28/2023   Health Maintenance Items Addressed: Up to date  Additional Screening:  Vision Screening: Recommended annual ophthalmology exams for early detection of glaucoma and other disorders of the eye.  Dental Screening: Recommended annual dental exams for proper oral hygiene  Community Resource Referral / Chronic Care Management: CRR required this visit?  No   CCM required this visit?  No     Plan:     I have personally reviewed and noted the following in the patient's chart:   Medical and social history Use of alcohol, tobacco or illicit drugs  Current medications and supplements including opioid prescriptions. Patient is not currently taking opioid prescriptions. Functional ability and status Nutritional status Physical activity Advanced directives List of other physicians Hospitalizations, surgeries, and ER visits in previous 12 months Vitals Screenings to include cognitive, depression, and falls Referrals and appointments  In addition, I have reviewed and discussed with patient certain preventive protocols, quality metrics, and best practice recommendations. A written personalized care plan for preventive services as well as general preventive health recommendations were provided to  patient.     Barb Merino, LPN   8/75/6433   After Visit Summary: (MyChart) Due to this being a telephonic visit, the after visit summary with patients personalized plan was offered to patient via MyChart   Notes: Nothing significant to report at this time.

## 2023-10-06 NOTE — Patient Instructions (Signed)
 [name] , Thank you for taking time to come for your Medicare Wellness Visit. I appreciate your ongoing commitment to your health goals. Please review the following plan we discussed and let me know if I can assist you in the future.   Referrals/Orders/Follow-Ups/Clinician Recommendations: none  This is a list of the screening recommended for you and due dates:  Health Maintenance  Topic Date Due   Screening for Lung Cancer  07/11/2023   COVID-19 Vaccine (9 - Pfizer risk 2024-25 season) 09/28/2023   Medicare Annual Wellness Visit  10/05/2024   Colon Cancer Screening  03/28/2025   DTaP/Tdap/Td vaccine (3 - Td or Tdap) 07/10/2028   Flu Shot  Completed   Hepatitis C Screening  Completed   Zoster (Shingles) Vaccine  Completed   HPV Vaccine  Aged Out   Pneumonia Vaccine  Discontinued    Advanced directives: (In Chart) A copy of your advanced directives are scanned into your chart should your provider ever need it.  Next Medicare Annual Wellness Visit scheduled for next year: Yes  insert Preventive Care attachment Insert FALL PREVENTION attachment if needed

## 2023-10-21 DIAGNOSIS — M9902 Segmental and somatic dysfunction of thoracic region: Secondary | ICD-10-CM | POA: Diagnosis not present

## 2023-10-21 DIAGNOSIS — M9905 Segmental and somatic dysfunction of pelvic region: Secondary | ICD-10-CM | POA: Diagnosis not present

## 2023-10-21 DIAGNOSIS — M5441 Lumbago with sciatica, right side: Secondary | ICD-10-CM | POA: Diagnosis not present

## 2023-10-21 DIAGNOSIS — M9903 Segmental and somatic dysfunction of lumbar region: Secondary | ICD-10-CM | POA: Diagnosis not present

## 2023-10-22 ENCOUNTER — Telehealth: Payer: Self-pay

## 2023-10-22 NOTE — Telephone Encounter (Signed)
 Pt. Last booster 03/31/23   Copied from CRM #[id number]. Topic: Clinical - Medical Advice >> Oct 22, 2023  1:50 PM Carlatta H wrote: Reason for CRM: Patient would like to know if he should take the second shot for covid//Please call to advise

## 2023-10-23 ENCOUNTER — Telehealth: Payer: Self-pay | Admitting: Cardiology

## 2023-10-23 NOTE — Telephone Encounter (Signed)
 [name]

## 2023-10-23 NOTE — Telephone Encounter (Signed)
 Patient was calling to make a 6 months f/u, advise patient that Dr. Swaziland has him down for 1 yr fu. Patient wants the nurse to call him to confirm that. Please advise

## 2023-10-23 NOTE — Telephone Encounter (Signed)
 Called patient left message on personal voice mail you are due to see Dr.Jordan this month.April schedule is already full.Appointment scheduled with Dr.Jordan 5/27 at 11:40 am. At new office.Advised to call back if this appointment does not work.

## 2023-12-08 ENCOUNTER — Telehealth: Payer: Self-pay | Admitting: Cardiology

## 2023-12-08 NOTE — Telephone Encounter (Signed)
 Wife states patient has been admitted in the hospital in [place] for the past 3 weeks. He went in for liver abscess, but had cardiac related issues while admitted He is scheduled to be discharged either today or tomorrow. Wife plans to have hospital fax records for Dr. Christophe Cram review prior to 5/27 appointment--FYI.

## 2023-12-08 NOTE — Progress Notes (Unsigned)
 Cardiology Office Note:    Date:  12/15/2023   ID:  Henry Rogers, DOB May 21, 1951, MRN [id number]  PCP:  Watson Hacking, MD  Cardiologist:  Ieshia Hatcher [place], MD  Electrophysiologist:  None   Referring MD: Watson Hacking, MD   Chief Complaint  Patient presents with   Coronary Artery Disease     History of Present Illness:    Henry Rogers is a 73 y.o. male with a hx of HLD, OSA, chronic diastolic heart failure and CAD.  Seen for post hospital follow up.   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  He also had a cardiogenic shock.  RCA was stented with a 5 x 16 mm Veriflex stent postdilated to 6 mm.  EF was 45 to 50%.  The other coronary vessels did not have significant disease.  Repeat echocardiogram in September 2015 showed LV function was normalized to 55 to 60%.  He was readmitted in October 2016 with recurrent inferior infarction again associated with cardiogenic shock.  The stent in the mid RCA was patent however distal RCA was occluded was heavy thrombus.  This was treated with balloon angioplasty.  He had a transient episode of paroxysmal atrial fibrillation after the cardiac catheterization, this was resolved after IV amiodarone  and has not recurred since.  He is not on any systemic anticoagulation due to the brief episodes without recurrence. He is on chronic DAPT.  In  April 2024- he had a syncopal episode.  Was seen in ED and had laceration of scalp. Labs and CT head and neck were ok.  No recurrence when seen in October.  He was recently admitted to the hospital in [place] with multiple hepatic abscesses - polymicrobial. He was septic. He also had AKI, hyponatremia, mental status changes. He had a hepatic drain placed and developed shock. Had AFib with RVR. Did subsequently convert to NSR. Echo showed preserved EF with some inferior scar. Valves ok. Was switched from Brilinta  to Pradaxa. He doesn't   remember much of this. No chest pain. He did have superficial thrombophlebitis of the right cephalic vein but apparently US  negative for DVT. On follow up today he still feels weak. No chest pain, dyspnea, palpitations. Appetite is still off. Lost 20 lbs       Past Medical History:  Diagnosis Date   BPH (benign prostatic hyperplasia)    CAD (coronary artery disease)    a. inf STEMI s/p BMS to mRCA (02/03/14)  b. inf STEMI s/p PCTA to RCA with slow reflow.   Chronic diastolic CHF (congestive heart failure) (HCC)    a. 2D echo on 05/18/15 LVEF 55-60%, akinesis basal inferior wall, G2DD     GERD (gastroesophageal reflux disease)    Hearing loss    Hemorrhoids    HH (hiatus hernia)    Ischemic cardiomyopathy    a. ECHO 01/2014 EF 45-50% and akinesis of the entire inferior myocardium. Mild MR.   b. now resolved (EF 55-60% on 04/22/15)     Metabolic syndrome    Myocardial infarction (HCC) 2015   Heart attack in 2015 and 2017   OSA (obstructive sleep apnea)    a. mild not requiring CPAP at this time   PAF (paroxysmal atrial fibrillation) (HCC)    a. brief episode after cardiac cath on 05/16/15- resolved after IV amiodarone .   Sleep apnea    Tinnitus    Varicose veins  Past Surgical History:  Procedure Laterality Date   CARDIAC CATHETERIZATION N/A 05/16/2015   Procedure: Left Heart Cath and Coronary Angiography;  Surgeon: Arty Binning, MD;  Location: [facility] INVASIVE CV LAB;  Service: Cardiovascular;  Laterality: N/A;   CARDIAC CATHETERIZATION N/A 05/16/2015   Procedure: Coronary Stent Intervention;  Surgeon: Arty Binning, MD;  Location: [facility] INVASIVE CV LAB;  Service: Cardiovascular;  Laterality: N/A;   CARPAL TUNNEL RELEASE Bilateral 03/02/2015   Procedure: BILATERAL CARPAL TUNNEL RELEASE ;  Surgeon: Lyanne Sample, MD;  Location: [facility];  Service: Orthopedics;  Laterality: Bilateral;   COLONOSCOPY  07/21/2004   Dr. Nickey Barn   CORONARY ANGIOPLASTY WITH STENT PLACEMENT   02/02/2014   BMS to RCA   ENDOVENOUS ABLATION SAPHENOUS VEIN W/ LASER Right 10/12/2014   EVLA right greater saphenous vein by Ouida Bloom MD   ESOPHAGEAL DILATION  07/23/2021   Procedure: ESOPHAGEAL DILATION;  Surgeon: Annis Kinder, DO;  Location: [facility] OR;  Service: Endoscopy;;   ESOPHAGOGASTRODUODENOSCOPY N/A 07/23/2021   Procedure: ESOPHAGOGASTRODUODENOSCOPY (EGD);  Surgeon: Annis Kinder, DO;  Location: [facility] OR;  Service: Endoscopy;  Laterality: N/A;   HERNIA REPAIR  1953   Also 2023   HIATAL HERNIA REPAIR N/A 07/23/2021   Procedure: LAPAROSCOPIC REPAIR OF HIATAL HERNIA, LAPOROSCOPIC  TRANSVERSE ABDOMINIS PLANE (TAP) BLOCK ;  Surgeon: Aldean Hummingbird, MD;  Location: [facility] OR;  Service: General;  Laterality: N/A;   LEFT HEART CATHETERIZATION WITH CORONARY ANGIOGRAM N/A 02/02/2014   Procedure: LEFT HEART CATHETERIZATION WITH CORONARY ANGIOGRAM;  Surgeon: Jaiceon Collister M [place], MD;  Location: [facility] CATH LAB;  Service: Cardiovascular;  Laterality: N/A;   SKIN BIOPSY Left 06/19/2021   seborrheic keratosis   stab phlebectomy  Right 03/15/2015   stab phlebectomy > 20 incisions (right leg) by Ouida Bloom MD   TRANSORAL INCISIONLESS FUNDOPLICATION N/A 07/23/2021   Procedure: TRANSORAL INCISIONLESS FUNDOPLICATION;  Surgeon: Annis Kinder, DO;  Location: [facility] OR;  Service: Endoscopy;  Laterality: N/A;    Current Medications: Current Meds  Medication Sig   atorvastatin  (LIPITOR ) 80 MG tablet TAKE 1 TABLET(80 MG) BY MOUTH DAILY AT 6 PM   cephALEXin (KEFLEX) 500 MG capsule Take 500 mg by mouth 4 (four) times daily.   Dabigatran Etexilate Mesylate 110 MG CAPS Take 110 mg by mouth.   LORazepam (ATIVAN) 1 MG tablet Take 1 mg by mouth every 8 (eight) hours.   METRONIDAZOLE PO Take 400 mg by mouth in the morning, at noon, and at bedtime.   omeprazole  (PRILOSEC) 40 MG capsule Take 40 mg by mouth daily.   Solifenacin Succinate 5 MG/5ML SUSP Take 1 tablet by mouth daily.   tadalafil  (CIALIS ) 5 MG tablet TAKE 1  TABLET(5 MG) BY MOUTH DAILY   [DISCONTINUED] metoprolol  succinate (TOPROL  XL) 25 MG 24 hr tablet Take 1 tablet (25 mg total) by mouth daily.     Allergies:   Ace inhibitors and Penicillins   Social History   Socioeconomic History   Marital status: Married    Spouse name: Not on file   Number of children: Not on file   Years of education: Not on file   Highest education level: Professional school degree (e.g., MD, DDS, DVM, JD)  Occupational History   Not on file  Tobacco Use   Smoking status: Former   Smokeless tobacco: Never  Vaping Use   Vaping status: Never Used  Substance and Sexual Activity   Alcohol use: Yes    Alcohol/week: 3.0 standard drinks of  alcohol    Types: 3 Glasses of wine per week   Drug use: No   Sexual activity: Not Currently  Other Topics Concern   Not on file  Social History Narrative   Not on file   Social Drivers of Health   Financial Resource Strain: Low Risk  (10/06/2023)   Overall Financial Resource Strain (CARDIA)    Difficulty of Paying Living Expenses: Not hard at all  Food Insecurity: No Food Insecurity (10/06/2023)   Hunger Vital Sign    Worried About Running Out of Food in the Last Year: Never true    Ran Out of Food in the Last Year: Never true  Transportation Needs: No Transportation Needs (10/06/2023)   PRAPARE - Administrator, Civil Service (Medical): No    Lack of Transportation (Non-Medical): No  Physical Activity: Sufficiently Active (10/06/2023)   Exercise Vital Sign    Days of Exercise per Week: 7 days    Minutes of Exercise per Session: 30 min  Stress: No Stress Concern Present (10/06/2023)   [employer] of Occupational Health - Occupational Stress Questionnaire    Feeling of Stress : Not at all  Social Connections: Socially Integrated (10/06/2023)   Social Connection and Isolation Panel [NHANES]    Frequency of Communication with Friends and Family: More than three times a week    Frequency of Social  Gatherings with Friends and Family: Three times a week    Attends Religious Services: 1 to 4 times per year    Active Member of Clubs or Organizations: Yes    Attends Engineer, structural: More than 4 times per year    Marital Status: Married     Family History: The patient's family history includes Breast cancer in his mother; Cancer in his brother and mother; Cervical cancer in his mother; Hearing loss in his father; Heart disease in his brother; Hypertension in his mother; Pancreatic cancer in his brother; Skin cancer in his mother. There is no history of Colon cancer or Esophageal cancer.  ROS:   Please see the history of present illness.     All other systems reviewed and are negative.  EKGs/Labs/Other Studies Reviewed:    The following studies were reviewed today:  Cath 05/16/2015 Mid RCA to Dist RCA lesion, 10% stenosed. The lesion was previously treated with a stent (unknown type). Prox RCA to Mid RCA lesion, 25% stenosed. Prox LAD to Dist LAD lesion, 40% stenosed. Dist RCA lesion, 100% stenosed. Post intervention, there is a 10% residual stenosis.   Acute inferior myocardial infarction presenting with cardiogenic shock. PTCA of the distal RCA from 100% to less than 10%. The vessel is large and ectatic containing heavy thrombus. Reperfusion was complicated by severe "no reflow". Widely patent left coronary system. Intra-aortic balloon pump to improve diastolic flow and help resolve "no reflow". Left ventriculography was not performed     RECOMMENDATIONS:   One-to-one intra-aortic balloon, pulsation for at least 24 hours. Intravenous Levophed  to keep a mean arterial pressure greater than 65 mmHg. Intravenous Aggrastat  for at least 24 hours Aspirin  and Brilinta  indefinitely. IV amiodarone  to treat acute atrial fibrillation. Prognosis is guarded. This was discussed with the patient's family in full detail. IV heparin  without a bolus to start in 8 hours. Beta  blocker therapy as tolerated by blood pressure. 2-D Doppler echocardiogram 24-48 hours hence. Unfortunately, the patient will probably have a rocky post infarct course with potential for brady- and ventricular arrhythmias.   I personally  reviewed records from [place] including Echo report.   EKG Interpretation Date/Time:  Tuesday Dec 15 2023 12:05:05 EDT Ventricular Rate:  76 PR Interval:  152 QRS Duration:  82 QT Interval:  362 QTC Calculation: 407 R Axis:   33  Text Interpretation: Normal sinus rhythm Inferior infarct , old When compared with ECG of 16-Nov-2022 22:40, No significant change was found Confirmed by [place], Nansi [place] [phone]) on 12/15/2023 12:08:00 PM    Recent Labs: 09/11/2023: ALT 25; BUN 17; Creatinine, Ser 1.08; Hemoglobin 16.1; Platelets 167; Potassium 4.5; Sodium 138  Recent Lipid Panel    Component Value Date/Time   CHOL 132 09/11/2023 1124   TRIG 70 09/11/2023 1124   HDL 56 09/11/2023 1124   CHOLHDL 2.4 09/11/2023 1124   CHOLHDL 2.9 02/19/2017 0843   VLDL 15 02/19/2017 0843   LDLCALC 62 09/11/2023 1124    Physical Exam:    VS:  BP 100/70 (BP Location: Right Arm, Cuff Size: Normal)   Pulse 76   Ht 5\' 6"  (1.676 m)   Wt 167 lb (75.8 kg)   SpO2 97%   BMI 26.95 kg/m     Wt Readings from Last 3 Encounters:  12/15/23 167 lb (75.8 kg)  09/11/23 186 lb (84.4 kg)  05/13/23 184 lb 9.6 oz (83.7 kg)     GEN:  Well nourished, well developed in no acute distress HEENT: Normal NECK: No JVD; No carotid bruits LYMPHATICS: No lymphadenopathy CARDIAC: RRR, no murmurs, rubs, gallops RESPIRATORY:  Clear to auscultation without rales, wheezing or rhonchi  ABDOMEN: Soft, non-tender, non-distended MUSCULOSKELETAL:  No edema; No deformity  SKIN: Warm and dry NEUROLOGIC:  Alert and oriented x 3 PSYCHIATRIC:  Normal affect   ASSESSMENT:    1. Coronary artery disease involving native coronary artery of native heart without angina pectoris   2. Hyperlipidemia LDL  goal <70   3. PAF (paroxysmal atrial fibrillation) (HCC)         PLAN:    In order of problems listed above:  CAD: s/p inferior MI x 2. Aneurysmal RCA. Denies any recent anginal symptoms.  Continue aspirin . Off Brilinta  now since on Pradaxa. Will discontinue Toprol  XL given low BP.   Hyperlipidemia: Continue statin. Last LDL 62,  Syncope. April 2024. Etiology unclear. No recurrence  4.   Paroxysmal Afib in setting of sepsis/shock/hepatic abscesses. Will hold Toprol  given low BP. Continue anticoagulation for now but suspect Afib just related to sepsis. Will have him wear a 2 week event monitor. If no Afib would consider stopping Pradaxa and resume maintenance Brilinta   5.  Polymicrobial hepatic abscesses. Unclear source. Clinically improved. Completing antibiotics. Follow up with PCP and GI.     Follow up in 6 months   Medication Adjustments/Labs and Tests Ordered: Current medicines are reviewed at length with the patient today.  Concerns regarding medicines are outlined above.  Orders Placed This Encounter  Procedures   LONG TERM MONITOR (3-14 DAYS)   EKG 12-Lead    No orders of the defined types were placed in this encounter.    Patient Instructions  Medication Instructions:  Stop Toprol  Continue all other medications *If you need a refill on your cardiac medications before your next appointment, please call your pharmacy*  Lab Work: None ordered  Testing/Procedures: 2 week Heart Monitor will be mailed to your home with instructions  Follow-Up: At [facility], you and your health needs are our priority.  As part of our continuing mission to provide you with exceptional  heart care, our providers are all part of one team.  This team includes your primary Cardiologist (physician) and Advanced Practice Providers or APPs (Physician Assistants and Nurse Practitioners) who all work together to provide you with the care you need, when you need it.  Your next  appointment:  3 months    Provider:  Dr.Kalonji Zurawski   We recommend signing up for the patient portal called "MyChart".  Sign up information is provided on this After Visit Summary.  MyChart is used to connect with patients for Virtual Visits (Telemedicine).  Patients are able to view lab/test results, encounter notes, upcoming appointments, etc.  Non-urgent messages can be sent to your provider as well.   To learn more about what you can do with MyChart, go to [email or web address].      Signed, Zoraida Havrilla [place], MD  12/15/2023 12:53 PM     [facility]

## 2023-12-13 ENCOUNTER — Encounter: Payer: Self-pay | Admitting: Family Medicine

## 2023-12-15 ENCOUNTER — Encounter: Payer: Self-pay | Admitting: Cardiology

## 2023-12-15 ENCOUNTER — Ambulatory Visit

## 2023-12-15 ENCOUNTER — Ambulatory Visit: Attending: Cardiology | Admitting: Cardiology

## 2023-12-15 VITALS — BP 100/70 | HR 76 | Ht 66.0 in | Wt 167.0 lb

## 2023-12-15 DIAGNOSIS — E785 Hyperlipidemia, unspecified: Secondary | ICD-10-CM | POA: Diagnosis not present

## 2023-12-15 DIAGNOSIS — I2510 Atherosclerotic heart disease of native coronary artery without angina pectoris: Secondary | ICD-10-CM

## 2023-12-15 DIAGNOSIS — I480 Paroxysmal atrial fibrillation: Secondary | ICD-10-CM | POA: Diagnosis not present

## 2023-12-15 NOTE — Progress Notes (Unsigned)
 Enrolled for [employer] to mail a ZIO XT long term holter monitor to the patients address on file.

## 2023-12-15 NOTE — Patient Instructions (Signed)
 Medication Instructions:  Stop Toprol  Continue all other medications *If you need a refill on your cardiac medications before your next appointment, please call your pharmacy*  Lab Work: None ordered  Testing/Procedures: 2 week Heart Monitor will be mailed to your home with instructions  Follow-Up: At [facility], you and your health needs are our priority.  As part of our continuing mission to provide you with exceptional heart care, our providers are all part of one team.  This team includes your primary Cardiologist (physician) and Advanced Practice Providers or APPs (Physician Assistants and Nurse Practitioners) who all work together to provide you with the care you need, when you need it.  Your next appointment:  3 months    Provider:  Dr.Jordan   We recommend signing up for the patient portal called "MyChart".  Sign up information is provided on this After Visit Summary.  MyChart is used to connect with patients for Virtual Visits (Telemedicine).  Patients are able to view lab/test results, encounter notes, upcoming appointments, etc.  Non-urgent messages can be sent to your provider as well.   To learn more about what you can do with MyChart, go to [email or web address].

## 2023-12-16 ENCOUNTER — Ambulatory Visit: Admitting: Family Medicine

## 2023-12-16 VITALS — BP 114/70 | HR 83 | Wt 167.6 lb

## 2023-12-16 DIAGNOSIS — K7200 Acute and subacute hepatic failure without coma: Secondary | ICD-10-CM

## 2023-12-16 DIAGNOSIS — I2510 Atherosclerotic heart disease of native coronary artery without angina pectoris: Secondary | ICD-10-CM | POA: Diagnosis not present

## 2023-12-16 DIAGNOSIS — N179 Acute kidney failure, unspecified: Secondary | ICD-10-CM | POA: Diagnosis not present

## 2023-12-16 DIAGNOSIS — Z23 Encounter for immunization: Secondary | ICD-10-CM

## 2023-12-16 DIAGNOSIS — N3941 Urge incontinence: Secondary | ICD-10-CM

## 2023-12-16 DIAGNOSIS — I5032 Chronic diastolic (congestive) heart failure: Secondary | ICD-10-CM

## 2023-12-16 DIAGNOSIS — I480 Paroxysmal atrial fibrillation: Secondary | ICD-10-CM

## 2023-12-16 DIAGNOSIS — N400 Enlarged prostate without lower urinary tract symptoms: Secondary | ICD-10-CM

## 2023-12-16 DIAGNOSIS — A408 Other streptococcal sepsis: Secondary | ICD-10-CM | POA: Diagnosis not present

## 2023-12-16 DIAGNOSIS — R6521 Severe sepsis with septic shock: Secondary | ICD-10-CM | POA: Diagnosis not present

## 2023-12-16 LAB — LIPID PANEL

## 2023-12-16 MED ORDER — METRONIDAZOLE 500 MG PO TABS: 500.0000 mg | ORAL_TABLET | Freq: Three times a day (TID) | ORAL | 0 refills | Status: AC

## 2023-12-16 MED ORDER — SOLIFENACIN SUCCINATE 5 MG PO TABS: 5.0000 mg | ORAL_TABLET | Freq: Every day | ORAL | 1 refills | Status: DC

## 2023-12-16 MED ORDER — FINASTERIDE 5 MG PO TABS: 5.0000 mg | ORAL_TABLET | Freq: Every day | ORAL | 3 refills | Status: AC

## 2023-12-16 MED ORDER — CEPHALEXIN 500 MG PO CAPS: 500.0000 mg | ORAL_CAPSULE | Freq: Three times a day (TID) | ORAL | 0 refills | Status: DC

## 2023-12-16 NOTE — Progress Notes (Signed)
   Subjective:    Patient ID: Henry Rogers, male    DOB: [birth year], 73 y.o.   MRN: [id number]  HPI He is here for a recheck after recent hospitalization in [place].  He was admitted May 2 and sent home on May 23.  He was found to have multifocal liver abscess with gram-negative bacteremia.  It grew out strep intermedius angiosis group.  A PT C drain was inserted on May 6 and over 400 cc of purulent material was removed.  A drain was left in for almost the entire hospital course.  He was sent home on metronidazole and Keflex.  While in the hospital he did have evidence of atrial fibrillation that subsequently quieted down.  Echocardiogram was done and is listed in the records.  He was seen yesterday by cardiology and his medications were readjusted.  He also had evidence of an acute kidney injury secondary to the sepsis that has been slowly resolving.  He also had superficial thrombophlebitis which did respond.  He also had a rash secondary to amoxicillin  that was therefore switched to a cephalosporin.  While in the hospital he had difficulty with urinary incontinence which seem to responded to the solifenacinand tadalafil . The recommendation was to stay on a 4-week course of antibiotics and follow-up concerning cardiology which she is already done and also GI to further evaluate the cause of the strep infection. He also had difficulty with urge incontinence as well as BPH.  He had been on Cialis  but said that it really did not help much.  They then placed him on Vesicare which seems to have helped with his urinary symptoms but he is still getting up several times per night.  He does notice decreased stream.  Review of Systems     Objective:    Physical Exam        Assessment & Plan:  Sepsis due to other Streptococcus species with acute liver failure and septic shock without hepatic coma (HCC) - Plan: CBC with Differential/Platelet, Comprehensive metabolic panel with GFR, Lipid panel, CT  ABDOMEN PELVIS W CONTRAST, metroNIDAZOLE (FLAGYL) 500 MG tablet, cephALEXin (KEFLEX) 500 MG capsule, Ambulatory referral to Gastroenterology  PAF (paroxysmal atrial fibrillation) (HCC)  Coronary artery disease involving native coronary artery of native heart without angina pectoris - Plan: CBC with Differential/Platelet, Comprehensive metabolic panel with GFR  Chronic diastolic CHF (congestive heart failure) (HCC)  Need for pneumococcal 20-valent conjugate vaccination - Plan: Pneumococcal conjugate vaccine 20-valent (Prevnar 20)  AKI (acute kidney injury) (HCC)  Urge incontinence - Plan: solifenacin (VESICARE) 5 MG tablet, finasteride (PROSCAR) 5 MG tablet  Benign prostatic hyperplasia without lower urinary tract symptoms - Plan: solifenacin (VESICARE) 5 MG tablet, finasteride (PROSCAR) 5 MG tablet   He is to follow-up with urology concerning continuing the history of A-fib. I will add finasteride to his regimen as well as Vesicare.  We might need to reassess this at some point down the road and possibly refer to urology but not at the present time. Will refer to GI to have their input into possibly doing a colonoscopy to further evaluate the cause of the strep infection since GI is a concern there.

## 2023-12-17 ENCOUNTER — Ambulatory Visit: Payer: Self-pay | Admitting: Family Medicine

## 2023-12-17 ENCOUNTER — Other Ambulatory Visit: Payer: Self-pay | Admitting: Family Medicine

## 2023-12-17 LAB — COMPREHENSIVE METABOLIC PANEL WITH GFR
ALT: 23 IU/L (ref 0–44)
AST: 23 IU/L (ref 0–40)
Albumin: 3.4 g/dL — ABNORMAL LOW (ref 3.8–4.8)
Alkaline Phosphatase: 195 IU/L — ABNORMAL HIGH (ref 44–121)
BUN/Creatinine Ratio: 13 (ref 10–24)
BUN: 15 mg/dL (ref 8–27)
Bilirubin Total: 0.4 mg/dL (ref 0.0–1.2)
CO2: 21 mmol/L (ref 20–29)
Calcium: 9 mg/dL (ref 8.6–10.2)
Chloride: 100 mmol/L (ref 96–106)
Creatinine, Ser: 1.15 mg/dL (ref 0.76–1.27)
Globulin, Total: 3 g/dL (ref 1.5–4.5)
Glucose: 90 mg/dL (ref 70–99)
Potassium: 4.3 mmol/L (ref 3.5–5.2)
Sodium: 137 mmol/L (ref 134–144)
Total Protein: 6.4 g/dL (ref 6.0–8.5)
eGFR: 67 mL/min/{1.73_m2} (ref >59)

## 2023-12-17 LAB — CBC WITH DIFFERENTIAL/PLATELET
Basophils Absolute: 0 10*3/uL (ref 0.0–0.2)
Basos: 0 %
EOS (ABSOLUTE): 0.2 10*3/uL (ref 0.0–0.4)
Eos: 3 %
Hematocrit: 37.3 % — ABNORMAL LOW (ref 37.5–51.0)
Hemoglobin: 11.8 g/dL — ABNORMAL LOW (ref 13.0–17.7)
Immature Grans (Abs): 0 10*3/uL (ref 0.0–0.1)
Immature Granulocytes: 0 %
Lymphocytes Absolute: 4.1 10*3/uL — ABNORMAL HIGH (ref 0.7–3.1)
Lymphs: 49 %
MCH: 30.8 pg (ref 26.6–33.0)
MCHC: 31.6 g/dL (ref 31.5–35.7)
MCV: 97 fL (ref 79–97)
Monocytes Absolute: 0.6 10*3/uL (ref 0.1–0.9)
Monocytes: 7 %
Neutrophils Absolute: 3.5 10*3/uL (ref 1.4–7.0)
Neutrophils: 41 %
Platelets: 319 10*3/uL (ref 150–450)
RBC: 3.83 x10E6/uL — ABNORMAL LOW (ref 4.14–5.80)
RDW: 13.5 % (ref 11.6–15.4)
WBC: 8.5 10*3/uL (ref 3.4–10.8)

## 2023-12-17 LAB — LIPID PANEL
Cholesterol, Total: 101 mg/dL (ref 100–199)
HDL: 27 mg/dL — ABNORMAL LOW (ref >39)
LDL CALC COMMENT:: 3.7 ratio (ref 0.0–5.0)
LDL Chol Calc (NIH): 44 mg/dL (ref 0–99)
Triglycerides: 182 mg/dL — ABNORMAL HIGH (ref 0–149)
VLDL Cholesterol Cal: 30 mg/dL (ref 5–40)

## 2023-12-18 ENCOUNTER — Telehealth: Payer: Self-pay

## 2023-12-18 NOTE — Telephone Encounter (Signed)
-----   Message from Ron Cobbs sent at 12/17/2023  1:25 PM EDT ----- Patient would like to be seen by Dr. Nickey Barn so I dropped the information off down there.  Check with their office to make sure he gets scheduled [phone] ----- Message ----- From: Bascom Lily, CMA Sent: 12/17/2023  11:06 AM EDT To: Watson Hacking, MD

## 2023-12-18 NOTE — Telephone Encounter (Signed)
 Please make sure pt gets scheduled with Dr. Nickey Barn as soon as possible, thank you.

## 2023-12-21 ENCOUNTER — Encounter: Payer: Self-pay | Admitting: Radiology

## 2023-12-21 ENCOUNTER — Ambulatory Visit
Admission: RE | Admit: 2023-12-21 | Discharge: 2023-12-21 | Disposition: A | Discharge status: Home / Self Care | Source: Ambulatory Visit | Attending: Family Medicine | Admitting: Family Medicine

## 2023-12-21 DIAGNOSIS — R6521 Severe sepsis with septic shock: Secondary | ICD-10-CM

## 2023-12-21 DIAGNOSIS — K7689 Other specified diseases of liver: Secondary | ICD-10-CM | POA: Diagnosis not present

## 2023-12-21 DIAGNOSIS — M51379 Other intervertebral disc degeneration, lumbosacral region without mention of lumbar back pain or lower extremity pain: Secondary | ICD-10-CM | POA: Diagnosis not present

## 2023-12-21 DIAGNOSIS — I700 Atherosclerosis of aorta: Secondary | ICD-10-CM | POA: Diagnosis not present

## 2023-12-21 MED ORDER — IOPAMIDOL (ISOVUE-300) INJECTION 61%
100.0000 mL | Freq: Once | INTRAVENOUS | Status: AC | PRN
  Administered 2023-12-21: 100 mL via INTRAVENOUS

## 2023-12-22 ENCOUNTER — Other Ambulatory Visit

## 2023-12-22 DIAGNOSIS — L570 Actinic keratosis: Secondary | ICD-10-CM | POA: Diagnosis not present

## 2023-12-23 DIAGNOSIS — K5730 Diverticulosis of large intestine without perforation or abscess without bleeding: Secondary | ICD-10-CM | POA: Diagnosis not present

## 2023-12-23 DIAGNOSIS — R509 Fever, unspecified: Secondary | ICD-10-CM | POA: Diagnosis not present

## 2023-12-23 DIAGNOSIS — K750 Abscess of liver: Secondary | ICD-10-CM | POA: Diagnosis not present

## 2023-12-23 DIAGNOSIS — R933 Abnormal findings on diagnostic imaging of other parts of digestive tract: Secondary | ICD-10-CM | POA: Diagnosis not present

## 2023-12-24 ENCOUNTER — Other Ambulatory Visit

## 2023-12-24 ENCOUNTER — Other Ambulatory Visit: Payer: Self-pay | Admitting: Family Medicine

## 2023-12-24 DIAGNOSIS — A408 Other streptococcal sepsis: Secondary | ICD-10-CM

## 2023-12-24 NOTE — Progress Notes (Signed)
 He was seen recently by Dr. Nickey Barn.  He has a large liver abscess with question of the etiology of what caused the abscess.  There is also question of I want to keep him on an antibiotic.  Referral will be made to infectious disease.

## 2023-12-29 ENCOUNTER — Encounter: Payer: Self-pay | Admitting: Cardiology

## 2024-01-04 ENCOUNTER — Telehealth: Payer: Self-pay | Admitting: Family Medicine

## 2024-01-04 ENCOUNTER — Encounter: Payer: Self-pay | Admitting: Family Medicine

## 2024-01-04 NOTE — Telephone Encounter (Signed)
 Received CRM requesting a call back, Left message for pt

## 2024-01-04 NOTE — Telephone Encounter (Signed)
 Copied from CRM [id number]. Topic: General - Other >> Jan 04, 2024  9:36 AM Antonieta Kitten wrote: Reason for CRM: Requesting to speak with Rice Chamorro prior to upcoming appt. Left message for pt

## 2024-01-04 NOTE — Telephone Encounter (Unsigned)
 Copied from CRM [id number]. Topic: General - Other >> Jan 04, 2024  9:36 AM Antonieta Kitten wrote: Reason for CRM: Requesting to speak with Rice Chamorro prior to upcoming appt.

## 2024-01-05 ENCOUNTER — Encounter: Payer: Self-pay | Admitting: Infectious Disease

## 2024-01-05 DIAGNOSIS — B955 Unspecified streptococcus as the cause of diseases classified elsewhere: Secondary | ICD-10-CM | POA: Insufficient documentation

## 2024-01-05 DIAGNOSIS — K750 Abscess of liver: Secondary | ICD-10-CM | POA: Insufficient documentation

## 2024-01-05 DIAGNOSIS — R7881 Bacteremia: Secondary | ICD-10-CM | POA: Insufficient documentation

## 2024-01-05 NOTE — Progress Notes (Signed)
 Reason for infectious disease consult: Streptococcal bacteremia liver abscesses  Requesting physician: Watson Hacking, MD  Subjective:    Patient ID: Henry Rogers, male    DOB: 1951-07-13, 73 y.o.   MRN: [id number]  HPI  Discussed the use of AI scribe software for clinical note transcription with the patient, who gave verbal consent to proceed.  History of Present Illness   Henry Rogers is a 73 year old male who presents for follow-up of a liver abscess and streptococcal  bacteremia. He is accompanied by his wife.  He was admitted to a hospital in [place] for a bloodstream infection and a large liver abscess, measuring 12x10x11 cm, occupying nearly the entire right hepatic lobe. His hospital course included acute renal injury and sepsis, atrial fibrillation. A percutaneous drain removed over 400 mL of purulent material. He was discharged on cephalexin  and metronidazole .  During hospitalization, he developed a rash after IV penicillin was changed to oral amoxicillin /clavunic acid  necessitating a switch to cephalexin  and metronidazole . He is currently on cephalexin  500 mg three times daily and metronidazole  three times daily. He has a history of rash with amoxicillin .  A CT scan in [place] on December 21, 2023, revealed two persistent irregular fluid collections with parenchymal edema in the liver, the largest measuring 2.9x2.7x1.8 cm and a smaller one measuring 3.1x1.1x2 cm. He reports feeling nearly back to 100% but experienced significant weight and muscle mass loss during his illness.  He has been on antibiotics since Nov 19, 2023. He is scheduled for an endoscopy and colonoscopy to investigate potential infection sources.      Past Medical History:  Diagnosis Date   BPH (benign prostatic hyperplasia)    CAD (coronary artery disease)    a. inf STEMI s/p BMS to mRCA (02/03/14)  b. inf STEMI s/p PCTA to RCA with slow reflow.   Chronic diastolic CHF (congestive heart  failure) (HCC)    a. 2D echo on 05/18/15 LVEF 55-60%, akinesis basal inferior wall, G2DD     GERD (gastroesophageal reflux disease)    Hearing loss    Hemorrhoids    HH (hiatus hernia)    Ischemic cardiomyopathy    a. ECHO 01/2014 EF 45-50% and akinesis of the entire inferior myocardium. Mild MR.   b. now resolved (EF 55-60% on 04/22/15)     Metabolic syndrome    Myocardial infarction (HCC) 2015   Heart attack in 2015 and 2017   OSA (obstructive sleep apnea)    a. mild not requiring CPAP at this time   PAF (paroxysmal atrial fibrillation) (HCC)    a. brief episode after cardiac cath on 05/16/15- resolved after IV amiodarone .   Sleep apnea    Tinnitus    Varicose veins     Past Surgical History:  Procedure Laterality Date   CARDIAC CATHETERIZATION N/A 05/16/2015   Procedure: Left Heart Cath and Coronary Angiography;  Surgeon: Arty Binning, MD;  Location: [facility] INVASIVE CV LAB;  Service: Cardiovascular;  Laterality: N/A;   CARDIAC CATHETERIZATION N/A 05/16/2015   Procedure: Coronary Stent Intervention;  Surgeon: Arty Binning, MD;  Location: [facility] INVASIVE CV LAB;  Service: Cardiovascular;  Laterality: N/A;   CARPAL TUNNEL RELEASE Bilateral 03/02/2015   Procedure: BILATERAL CARPAL TUNNEL RELEASE ;  Surgeon: Lyanne Sample, MD;  Location: [facility];  Service: Orthopedics;  Laterality: Bilateral;   COLONOSCOPY  07/21/2004   Dr. Nickey Barn   CORONARY ANGIOPLASTY WITH STENT PLACEMENT  02/02/2014  BMS to RCA   ENDOVENOUS ABLATION SAPHENOUS VEIN W/ LASER Right 10/12/2014   EVLA right greater saphenous vein by Ouida Bloom MD   ESOPHAGEAL DILATION  07/23/2021   Procedure: ESOPHAGEAL DILATION;  Surgeon: Annis Kinder, DO;  Location: [facility] OR;  Service: Endoscopy;;   ESOPHAGOGASTRODUODENOSCOPY N/A 07/23/2021   Procedure: ESOPHAGOGASTRODUODENOSCOPY (EGD);  Surgeon: Annis Kinder, DO;  Location: [facility] OR;  Service: Endoscopy;  Laterality: N/A;   HERNIA REPAIR  1953   Also 2023   HIATAL  HERNIA REPAIR N/A 07/23/2021   Procedure: LAPAROSCOPIC REPAIR OF HIATAL HERNIA, LAPOROSCOPIC  TRANSVERSE ABDOMINIS PLANE (TAP) BLOCK ;  Surgeon: Aldean Hummingbird, MD;  Location[facility] OR;  Service: General;  Laterality: N/A;   LEFT HEART CATHETERIZATION WITH CORONARY ANGIOGRAM N/A 02/02/2014   Procedure: LEFT HEART CATHETERIZATION WITH CORONARY ANGIOGRAM;  Surgeon: Peter M Swaziland, MD;  Location: [facility] CATH LAB;  Service: Cardiovascular;  Laterality: N/A;   SKIN BIOPSY Left 06/19/2021   seborrheic keratosis   stab phlebectomy  Right 03/15/2015   stab phlebectomy > 20 incisions (right leg) by Ouida Bloom MD   TRANSORAL INCISIONLESS FUNDOPLICATION N/A 07/23/2021   Procedure: TRANSORAL INCISIONLESS FUNDOPLICATION;  Surgeon: Annis Kinder, DO;  Location: [facility] OR;  Service: Endoscopy;  Laterality: N/A;    Family History  Problem Relation Age of Onset   Hypertension Mother    Cervical cancer Mother    Breast cancer Mother    Skin cancer Mother    Cancer Mother    Hearing loss Father    Pancreatic cancer Brother    Heart disease Brother    Cancer Brother    Colon cancer Neg Hx    Esophageal cancer Neg Hx       Social History   Socioeconomic History   Marital status: Married    Spouse name: Not on file   Number of children: Not on file   Years of education: Not on file   Highest education level: Professional school degree (e.g., MD, DDS, DVM, JD)  Occupational History   Not on file  Tobacco Use   Smoking status: Former   Smokeless tobacco: Never  Vaping Use   Vaping status: Never Used  Substance and Sexual Activity   Alcohol use: Yes    Alcohol/week: 3.0 standard drinks of alcohol    Types: 3 Glasses of wine per week   Drug use: No   Sexual activity: Not Currently  Other Topics Concern   Not on file  Social History Narrative   Not on file   Social Drivers of Health   Financial Resource Strain: Low Risk  (10/06/2023)   Overall Financial Resource Strain (CARDIA)    Difficulty of  Paying Living Expenses: Not hard at all  Food Insecurity: No Food Insecurity (10/06/2023)   Hunger Vital Sign    Worried About Running Out of Food in the Last Year: Never true    Ran Out of Food in the Last Year: Never true  Transportation Needs: No Transportation Needs (10/06/2023)   PRAPARE - Administrator, Civil Service (Medical): No    Lack of Transportation (Non-Medical): No  Physical Activity: Sufficiently Active (10/06/2023)   Exercise Vital Sign    Days of Exercise per Week: 7 days    Minutes of Exercise per Session: 30 min  Stress: No Stress Concern Present (10/06/2023)   [employer] of Occupational Health - Occupational Stress Questionnaire    Feeling of Stress : Not at all  Social Connections:  Socially Integrated (10/06/2023)   Social Connection and Isolation Panel    Frequency of Communication with Friends and Family: More than three times a week    Frequency of Social Gatherings with Friends and Family: Three times a week    Attends Religious Services: 1 to 4 times per year    Active Member of Clubs or Organizations: Yes    Attends Engineer, structural: More than 4 times per year    Marital Status: Married    Allergies  Allergen Reactions   Ace Inhibitors Other (See Comments)    No personal hx reaction, but 2 family members have had persistent cough with the class and he prefers not to take ACEi.   Penicillins Rash     Current Outpatient Medications:    aspirin  81 MG tablet, Take 81 mg by mouth daily., Disp: , Rfl:    atorvastatin  (LIPITOR ) 80 MG tablet, TAKE 1 TABLET(80 MG) BY MOUTH DAILY AT 6 PM, Disp: 90 tablet, Rfl: 3   BRILINTA  60 MG TABS tablet, TAKE 1 TABLET(60 MG) BY MOUTH TWICE DAILY (Patient not taking: Reported on 12/16/2023), Disp: 180 tablet, Rfl: 2   cephALEXin  (KEFLEX ) 500 MG capsule, Take 1 capsule (500 mg total) by mouth 3 (three) times daily., Disp: 126 capsule, Rfl: 0   Dabigatran Etexilate Mesylate 110 MG CAPS, Take 110 mg  by mouth., Disp: , Rfl:    finasteride  (PROSCAR ) 5 MG tablet, Take 1 tablet (5 mg total) by mouth daily., Disp: 90 tablet, Rfl: 3   LORazepam (ATIVAN) 1 MG tablet, Take 1 mg by mouth every 8 (eight) hours., Disp: , Rfl:    metroNIDAZOLE  (FLAGYL ) 500 MG tablet, Take 1 tablet (500 mg total) by mouth 3 (three) times daily for 21 days., Disp: 63 tablet, Rfl: 0   Multiple Vitamin (MULTIVITAMIN) tablet, Take 1 tablet by mouth every evening. 50 Plus, Disp: , Rfl:    nitroGLYCERIN  (NITROSTAT ) 0.4 MG SL tablet, Place 1 tablet (0.4 mg total) under the tongue every 5 (five) minutes as needed for chest pain. MAX 3 doses, Disp: 25 tablet, Rfl: 11   Nutritional Supplements (JUICE PLUS FIBRE PO), Take 1 tablet by mouth 2 (two) times daily. Fruit blend and vegetable blend, Disp: , Rfl:    omega-3 acid ethyl esters (LOVAZA ) 1 g capsule, Take 2 capsules (2 g total) by mouth 2 (two) times daily., Disp: 360 capsule, Rfl: 1   omeprazole  (PRILOSEC) 40 MG capsule, Take 40 mg by mouth daily., Disp: , Rfl:    solifenacin  (VESICARE ) 5 MG tablet, Take 1 tablet (5 mg total) by mouth daily., Disp: 90 tablet, Rfl: 1   tadalafil  (CIALIS ) 5 MG tablet, TAKE 1 TABLET(5 MG) BY MOUTH DAILY, Disp: 90 tablet, Rfl: 3   zolpidem  (AMBIEN ) 5 MG tablet, Take 1 tablet (5 mg total) by mouth at bedtime as needed. for sleep, Disp: 30 tablet, Rfl: 0   Review of Systems  Constitutional:  Negative for activity change, appetite change, chills, diaphoresis, fatigue, fever and unexpected weight change.  HENT:  Negative for congestion, rhinorrhea, sinus pressure, sneezing, sore throat and trouble swallowing.   Eyes:  Negative for photophobia and visual disturbance.  Respiratory:  Negative for cough, chest tightness, shortness of breath, wheezing and stridor.   Cardiovascular:  Negative for chest pain, palpitations and leg swelling.  Gastrointestinal:  Negative for abdominal distention, abdominal pain, anal bleeding, blood in stool, constipation,  diarrhea, nausea and vomiting.  Genitourinary:  Negative for difficulty urinating, dysuria, flank pain  and hematuria.  Musculoskeletal:  Negative for arthralgias, back pain, gait problem, joint swelling and myalgias.  Skin:  Negative for color change, pallor, rash and wound.  Neurological:  Negative for dizziness, tremors, weakness and light-headedness.  Hematological:  Negative for adenopathy. Does not bruise/bleed easily.  Psychiatric/Behavioral:  Negative for agitation, behavioral problems, confusion, decreased concentration, dysphoric mood and sleep disturbance.        Objective:   Physical Exam Constitutional:      Appearance: He is well-developed.  HENT:     Head: Normocephalic and atraumatic.   Eyes:     Conjunctiva/sclera: Conjunctivae normal.    Cardiovascular:     Rate and Rhythm: Normal rate and regular rhythm.  Pulmonary:     Effort: Pulmonary effort is normal. No respiratory distress.     Breath sounds: No wheezing.  Abdominal:     General: There is no distension.     Palpations: Abdomen is soft.   Musculoskeletal:        General: No tenderness. Normal range of motion.     Cervical back: Normal range of motion and neck supple.   Skin:    General: Skin is warm and dry.     Coloration: Skin is not pale.     Findings: No erythema or rash.   Neurological:     General: No focal deficit present.     Mental Status: He is alert and oriented to person, place, and time.   Psychiatric:        Mood and Affect: Mood normal.        Behavior: Behavior normal.        Thought Content: Thought content normal.        Judgment: Judgment normal.           Assessment & Plan:   Assessment and Plan    Liver abscess Large multiloculated liver abscess significantly reduced in size post-drainage and antibiotics I tend to treat until we have radiographic resolution which may be overkill but it is my approach I dont think the anaerobes that flagyl  is covering are  likely signfiicant at this point in time - Discontinue Flagyl . - Prescribe cefadroxil 1000 mg twice daily. - Order CT scan for late July to assess resolution.  Sepsis due to Streptococcus infection Sepsis from Streptococcus intermedius/Streptococcus anginosus treated with antibiotics. e. -switch to  cefadroxil as it covers Streptococcus species. --TTE from  [place] was clean without evidence of endocarditis  Acute kidney injury Acute kidney injury secondary to sepsis resolved. Discussed risk of contrast-induced nephropathy. - Monitor kidney function as needed, especially if contrast is used for future imaging.  Atrial fibrillation New onset atrial fibrillation noted. Awaiting heart monitor results. - Review heart monitor results when available.  Penicillin allergy Rash after IV penicillin suggests allergy. Rash resolved post-discontinuation. Discussed alternative antibiotics. - Avoid penicillin and related antibiotics. - Consider referral to an allergist for further evaluation if needed.       I have personally spent 80 minutes involved in face-to-face and non-face-to-face activities for this patient on the day of the visit. Professional time spent includes the following activities: Preparing to see the patient review of records from [place] I could observe in media tab, notes from PCP, Obtaining and/or reviewing separately obtained history ntoes from [facility], PCP Performing a medically appropriate examination and/or evaluation , Ordering cefadroxil, Bun/cr prior to CT that I ordered for end of July referring and communicating with other health care professionals, Dr. Robina Chol, Documenting clinical information in  the EMR, Independently interpreting results (not separately reported), Communicating results to the patient/family/caregiver, Counseling and educating the patien and his wife and Care coordination (not separately reported).

## 2024-01-06 ENCOUNTER — Other Ambulatory Visit: Payer: Self-pay

## 2024-01-06 ENCOUNTER — Telehealth: Payer: Self-pay | Admitting: *Deleted

## 2024-01-06 ENCOUNTER — Encounter: Payer: Self-pay | Admitting: Infectious Disease

## 2024-01-06 ENCOUNTER — Ambulatory Visit: Admitting: Infectious Disease

## 2024-01-06 ENCOUNTER — Ambulatory Visit: Payer: Self-pay | Admitting: Cardiology

## 2024-01-06 VITALS — BP 116/80 | HR 84 | Temp 97.7°F | Ht 66.0 in | Wt 172.0 lb

## 2024-01-06 DIAGNOSIS — B954 Other streptococcus as the cause of diseases classified elsewhere: Secondary | ICD-10-CM

## 2024-01-06 DIAGNOSIS — I480 Paroxysmal atrial fibrillation: Secondary | ICD-10-CM

## 2024-01-06 DIAGNOSIS — N179 Acute kidney failure, unspecified: Secondary | ICD-10-CM | POA: Diagnosis not present

## 2024-01-06 DIAGNOSIS — Z880 Allergy status to penicillin: Secondary | ICD-10-CM | POA: Diagnosis not present

## 2024-01-06 DIAGNOSIS — I2510 Atherosclerotic heart disease of native coronary artery without angina pectoris: Secondary | ICD-10-CM

## 2024-01-06 DIAGNOSIS — K750 Abscess of liver: Secondary | ICD-10-CM | POA: Diagnosis not present

## 2024-01-06 DIAGNOSIS — A419 Sepsis, unspecified organism: Secondary | ICD-10-CM

## 2024-01-06 DIAGNOSIS — I4891 Unspecified atrial fibrillation: Secondary | ICD-10-CM

## 2024-01-06 DIAGNOSIS — R7881 Bacteremia: Secondary | ICD-10-CM

## 2024-01-06 MED ORDER — CEFADROXIL 500 MG PO CAPS: 1000.0000 mg | ORAL_CAPSULE | Freq: Two times a day (BID) | ORAL | 2 refills | Status: DC

## 2024-01-06 MED ORDER — CEFADROXIL 500 MG PO CAPS: 1000.0000 mg | ORAL_CAPSULE | Freq: Two times a day (BID) | ORAL | 2 refills | Status: AC

## 2024-01-06 NOTE — Telephone Encounter (Signed)
   Pre-operative Risk Assessment    Patient Name: Henry Rogers  DOB: 11/12/1950 MRN: [id number]   Date of last office visit: 12/15/23 DR. Swaziland Date of next office visit: 03/16/24 DR. Swaziland   Request for Surgical Clearance    Procedure:  EGD AND COLONOSCOPY  Date of Surgery:  Clearance 01/14/24                                Surgeon:  DR. HUNG Surgeon's Group or Practice Name:  [facility] Phone number:  [phone] Fax number:  [phone]   Type of Clearance Requested:   - Medical ; NONE REQUESTED TO BE HELD. BRILINTA  ON MED LIST THOUGH REFLECTS PT STATES NOT TAKING   Type of Anesthesia:  PROPOFOL    Additional requests/questions:    Signed, Adonijah Baena   01/06/2024, 3:37 PM

## 2024-01-06 NOTE — Telephone Encounter (Signed)
 Dr. Swaziland,   You saw this patient on 12/15/2023. Per office protocol, will you please comment on medical clearance for EGD and colonoscopy?  Please route your response to P CV DIV Preop. I will communicate with requesting office once you have given recommendations.   Thank you!  Morey Ar, NP

## 2024-01-07 ENCOUNTER — Other Ambulatory Visit (HOSPITAL_COMMUNITY): Payer: Self-pay

## 2024-01-07 MED ORDER — TICAGRELOR 60 MG PO TABS
60.0000 mg | ORAL_TABLET | Freq: Two times a day (BID) | ORAL | 2 refills | Status: DC
  Filled 2024-01-07: qty 180, 90d supply, fill #0

## 2024-01-07 NOTE — Telephone Encounter (Signed)
     Primary Cardiologist: Peter Swaziland, MD  Chart reviewed as part of pre-operative protocol coverage. Given past medical history and time since last visit, based on ACC/AHA guidelines, Henry Rogers would be at acceptable risk for the planned procedure without further cardiovascular testing.   His Pradaxa may be held for 72 hours prior to his procedure.  He may discontinue Pradaxa after procedure and will need to start Brilinta  60 mg twice daily postoperatively when safe to do so.  I will route this recommendation to the requesting party via Epic fax function and remove from pre-op pool.  Please call with questions.  Chet Cota. Sadey Yandell NP-C     01/07/2024, 12:14 PM [facility] [address] Office [phone] Fax [phone]

## 2024-01-07 NOTE — Addendum Note (Signed)
 Addended by: Carie Charity on: 01/07/2024 12:17 PM   Modules accepted: Orders

## 2024-01-12 ENCOUNTER — Ambulatory Visit: Admitting: Family Medicine

## 2024-01-12 ENCOUNTER — Encounter: Payer: Self-pay | Admitting: Family Medicine

## 2024-01-12 VITALS — BP 124/78 | HR 85 | Wt 174.2 lb

## 2024-01-12 DIAGNOSIS — I2510 Atherosclerotic heart disease of native coronary artery without angina pectoris: Secondary | ICD-10-CM | POA: Diagnosis not present

## 2024-01-12 DIAGNOSIS — E785 Hyperlipidemia, unspecified: Secondary | ICD-10-CM | POA: Diagnosis not present

## 2024-01-12 DIAGNOSIS — K750 Abscess of liver: Secondary | ICD-10-CM

## 2024-01-12 DIAGNOSIS — K2100 Gastro-esophageal reflux disease with esophagitis, without bleeding: Secondary | ICD-10-CM | POA: Diagnosis not present

## 2024-01-12 DIAGNOSIS — Z9889 Other specified postprocedural states: Secondary | ICD-10-CM

## 2024-01-12 DIAGNOSIS — K449 Diaphragmatic hernia without obstruction or gangrene: Secondary | ICD-10-CM

## 2024-01-12 NOTE — Progress Notes (Signed)
   Subjective:    Patient ID: Henry Rogers, male    DOB: Apr 12, 1951, 73 y.o.   MRN: [id number]  HPI He is here for a follow-up visit.  He had a stormy course while on a trip to [place] and developed a large liver abscess that required drainage.  He was sent home on an antibiotic.  There was question as to etiology of the infection and he is being followed by GI.  He is scheduled for colonoscopy and endoscopy to look into that in more detail.  He also was seen recently by infectious disease and was switched to Duricef 1 g twice daily.  He is scheduled for repeat CT scan as well as blood work in the near future.  The note from  ID was reviewed and they will continue him on an antibiotic until the abscesses are gone.  He was seen recently by cardiology and placed back on Brilinta .  He is supposed to be taking Lovaza  2 pills twice per day but is taking 1 pill twice per day.  He also continues on omeprazole .  He did have a Nissen procedure done.  Presently he has had no chest pain, shortness of breath, fatigue, malaise.   Review of Systems     Objective:    Physical Exam Alert and in no distress.  Cardiac exam shows regular rhythm without murmurs gallops.  Lungs are clear to auscultation.  Abdominal exam shows no masses or tenderness.       Assessment & Plan:  Liver abscess  Hyperlipidemia LDL goal <70  History of fundoplication  Hiatal hernia with GERD and esophagitis  Coronary artery disease involving native coronary artery of native heart without angina pectoris Everything seems to be going quite nicely with the present medication regimen as well as follow-up with infectious disease, GI and cardiology.

## 2024-01-13 ENCOUNTER — Ambulatory Visit: Admitting: Internal Medicine

## 2024-01-14 DIAGNOSIS — D123 Benign neoplasm of transverse colon: Secondary | ICD-10-CM | POA: Diagnosis not present

## 2024-01-14 DIAGNOSIS — D120 Benign neoplasm of cecum: Secondary | ICD-10-CM | POA: Diagnosis not present

## 2024-01-14 DIAGNOSIS — K635 Polyp of colon: Secondary | ICD-10-CM | POA: Diagnosis not present

## 2024-01-14 DIAGNOSIS — R933 Abnormal findings on diagnostic imaging of other parts of digestive tract: Secondary | ICD-10-CM | POA: Diagnosis not present

## 2024-01-14 DIAGNOSIS — K5730 Diverticulosis of large intestine without perforation or abscess without bleeding: Secondary | ICD-10-CM | POA: Diagnosis not present

## 2024-01-18 ENCOUNTER — Telehealth: Payer: Self-pay

## 2024-01-18 NOTE — Telephone Encounter (Signed)
 Dr. Swaziland  We have received a surgical clearance request for Henry Rogers for aliposuction procedure. They were seen recently in clinic on 12/15/2023. Can you please comment on surgical clearance and guidance on holding Brilinta  and aspirin  81 mg prior to procedure. Please forward you guidance and recommendations to P CV DIV PREOP   Thank you, Jackee Alberts, NP

## 2024-01-18 NOTE — Telephone Encounter (Signed)
   Pre-operative Risk Assessment    Patient Name: Henry Rogers  DOB: August 25, 1950 MRN: [id number]   Date of last office visit: 12/15/23 PETER SWAZILAND, MD Date of next office visit: 03/16/24 PETER SWAZILAND, MD   Request for Surgical Clearance    Procedure:   LIPOSUCTION  Date of Surgery:  Clearance TBD                                Surgeon:  NOT INDICATED Surgeon's Group or Practice Name:  [facility] Phone number:  [phone] Fax number:  [phone]   Type of Clearance Requested:   - Medical  - Pharmacy:  Hold Aspirin  and Ticagrelor  (Brilinta )     Type of Anesthesia:  Local (USING 5L OF TUMESCENT FLUIDS CONTAINING 100CC OF 1% LIDOCAINE  WITH EPINEPHRINE  PER LITER)   Additional requests/questions:    Signed, Lucie DELENA Ku   01/18/2024, 5:01 PM

## 2024-01-19 ENCOUNTER — Telehealth: Payer: Self-pay

## 2024-01-19 NOTE — Telephone Encounter (Signed)
   Patient Name: Henry Rogers  DOB: 1950-10-28 MRN: [id number]  Primary Cardiologist: Peter Swaziland, MD  Chart reviewed as part of pre-operative protocol coverage. Given past medical history and time since last visit, based on ACC/AHA guidelines, Henry Rogers is at acceptable risk for the planned procedure without further cardiovascular testing.   Patient may hold Brilinta  5 days prior to procedure and should continue ASA 81 mg through the perioperative period.  The patient was advised that if he develops new symptoms prior to surgery to contact our office to arrange for a follow-up visit, and he verbalized understanding.  I will route this recommendation to the requesting party via Epic fax function and remove from pre-op pool.  Please call with questions.  Wyn Raddle, Jackee Shove, NP 01/19/2024, 7:52 AM

## 2024-01-19 NOTE — Telephone Encounter (Signed)
 They said they also requested EKG, labs can we please fax them to [phone]

## 2024-01-19 NOTE — Telephone Encounter (Signed)
 Called requesting office wasn't able to speak to [facility] who called earlier but front desk took a message. Made them aware clearance has been fax and EKG has been faxed we can't send labs due to us  not being the office that did them looks like patient had labs through PCP. Will remove clearance from pool and also made office aware if they had any questions or concerns to call our office and ask to speak to preop

## 2024-01-19 NOTE — Telephone Encounter (Signed)
 Called requesting office to make them aware we have sent clearance electronic and we do not keep the clearance form once we have input information into our preop clearance format those are discard I wasn't to speak to Almarie which was the person who called our office. The number on clearance is not correct it is actually patient's phone number the number to office or headquarters is [phone] it took a while till I got transferred to the correct location which is the [place], [place] office I wasn't provided a good phone number I spoke to a front desk person who took the message that whatever we faxed is the clearance that we do everything electronic and we don't have that form anymore she voiced understanding and was going to make the nurse aware.

## 2024-01-19 NOTE — Telephone Encounter (Signed)
 Copied from CRM [id number]. Topic: Medical Record Request - Provider/Facility Request >> Jan 19, 2024 12:28 PM Graeme ORN wrote: Reason for CRM: Almarie called from [facility]. Wanted to know if patient is being treated here for Liver abscess. Would like a callback. Phone: [phone]

## 2024-01-19 NOTE — Telephone Encounter (Signed)
 Office called back stating they need Dr Shlomo to signed off on the paperwork. Please advise

## 2024-01-27 ENCOUNTER — Ambulatory Visit (HOSPITAL_COMMUNITY)

## 2024-02-02 ENCOUNTER — Other Ambulatory Visit: Payer: Self-pay

## 2024-02-02 ENCOUNTER — Other Ambulatory Visit

## 2024-02-02 DIAGNOSIS — K750 Abscess of liver: Secondary | ICD-10-CM

## 2024-02-02 LAB — BUN/CREATININE RATIO
BUN: 18 mg/dL (ref 7–25)
Creat: 1.01 mg/dL (ref 0.70–1.28)
eGFR: 78 mL/min/1.73m2 (ref >=60)

## 2024-02-12 ENCOUNTER — Telehealth: Payer: Self-pay

## 2024-02-12 NOTE — Progress Notes (Signed)
   02/12/2024  Patient ID: Henry Rogers, male   DOB: 1950-08-12, 73 y.o.   MRN: [id number]  Pharmacy Quality Measure Review  This patient is appearing on a report for being at risk of failing the adherence measure for cholesterol (statin) medications this calendar year.   Medication: Atorvastatin  80mg  Last fill date: 09/13/23 for 90 day supply  Contacted pharmacy to facilitate refills. Patient confirms he still has some remaining and will pick up next week.  Jon VEAR Lindau, PharmD Clinical Pharmacist [phone]

## 2024-02-17 ENCOUNTER — Ambulatory Visit (HOSPITAL_COMMUNITY): Admission: RE | Admit: 2024-02-17 | Source: Ambulatory Visit

## 2024-02-19 ENCOUNTER — Ambulatory Visit (HOSPITAL_COMMUNITY)
Admission: RE | Admit: 2024-02-19 | Discharge: 2024-02-19 | Disposition: A | Discharge status: Home / Self Care | Source: Ambulatory Visit | Attending: Infectious Disease | Admitting: Infectious Disease

## 2024-02-19 DIAGNOSIS — R7881 Bacteremia: Secondary | ICD-10-CM | POA: Diagnosis not present

## 2024-02-19 DIAGNOSIS — K750 Abscess of liver: Secondary | ICD-10-CM | POA: Diagnosis not present

## 2024-02-19 DIAGNOSIS — R188 Other ascites: Secondary | ICD-10-CM | POA: Diagnosis not present

## 2024-02-19 DIAGNOSIS — B955 Unspecified streptococcus as the cause of diseases classified elsewhere: Secondary | ICD-10-CM | POA: Diagnosis not present

## 2024-02-19 DIAGNOSIS — K651 Peritoneal abscess: Secondary | ICD-10-CM | POA: Diagnosis not present

## 2024-02-19 MED ORDER — IOHEXOL 300 MG/ML  SOLN
100.0000 mL | Freq: Once | INTRAMUSCULAR | Status: AC | PRN
  Administered 2024-02-19: 100 mL via INTRAVENOUS

## 2024-02-19 MED ORDER — IOHEXOL 9 MG/ML PO SOLN
500.0000 mL | ORAL | Status: AC
  Administered 2024-02-19 (×2): 500 mL via ORAL

## 2024-02-29 ENCOUNTER — Encounter: Payer: Self-pay | Admitting: Infectious Disease

## 2024-02-29 NOTE — Telephone Encounter (Signed)
 I have reached out to radiology to request that they read the CT sooner. Patient also advised the CT has not been read yet

## 2024-03-01 ENCOUNTER — Ambulatory Visit: Payer: Self-pay | Admitting: Infectious Disease

## 2024-03-04 NOTE — Progress Notes (Signed)
 Cardiology Office Note:    Date:  03/16/2024   ID:  Henry Rogers, DOB [birth year], MRN [id number]  PCP:  Joyce Norleen BROCKS, MD  Cardiologist:  Adonijah Baena [place], MD  Electrophysiologist:  None   Referring MD: Joyce Norleen BROCKS, MD   Chief Complaint  Patient presents with   Coronary Artery Disease     History of Present Illness:    Henry Rogers is a 73 y.o. male with a hx of HLD, OSA, chronic diastolic heart failure and CAD.     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  He also had a cardiogenic shock.  RCA was stented with a 5 x 16 mm Veriflex stent postdilated to 6 mm.  EF was 45 to 50%.  The other coronary vessels did not have significant disease.  Repeat echocardiogram in September 2015 showed LV function was normalized to 55 to 60%.  He was readmitted in October 2016 with recurrent inferior infarction again associated with cardiogenic shock.  The stent in the mid RCA was patent however distal RCA was occluded was heavy thrombus.  This was treated with balloon angioplasty.  He had a transient episode of paroxysmal atrial fibrillation after the cardiac catheterization, this was resolved after IV amiodarone  and has not recurred since.  He is not on any systemic anticoagulation due to the brief episodes without recurrence. He is on chronic DAPT.  In  April 2024- he had a syncopal episode.  Was seen in ED and had laceration of scalp. Labs and CT head and neck were ok.  No recurrence when seen in October.  He was admitted to the hospital in [place] with multiple hepatic abscesses - polymicrobial. He was septic. He also had AKI, hyponatremia, mental status changes. He had a hepatic drain placed and developed shock. Had AFib with RVR. Did subsequently convert to NSR. Echo showed preserved EF with some inferior scar. Valves ok. Was switched from Brilinta  to Pradaxa. He doesn't  remember much of this. No chest pain. He  did have superficial thrombophlebitis of the right cephalic vein but apparently US  negative for DVT. On follow up today he still feels weak. No chest pain, dyspnea, palpitations. Appetite is still off. Lost 20 lbs   Subsequent event monitor showed no recurrent Afib so anticoagulation was discontinued in favor of DAPT.   On follow up today he is feeling better and appetite has improved. Has gained some weight back. Followed by Dr Fleeta Rothman. Still on antibiotics. No chest pain.      Past Medical History:  Diagnosis Date   BPH (benign prostatic hyperplasia)    CAD (coronary artery disease)    a. inf STEMI s/p BMS to mRCA (02/03/14)  b. inf STEMI s/p PCTA to RCA with slow reflow.   Chronic diastolic CHF (congestive heart failure) (HCC)    a. 2D echo on 05/18/15 LVEF 55-60%, akinesis basal inferior wall, G2DD     GERD (gastroesophageal reflux disease)    Hearing loss    Hemorrhoids    HH (hiatus hernia)    Ischemic cardiomyopathy    a. ECHO 01/2014 EF 45-50% and akinesis of the entire inferior myocardium. Mild MR.   b. now resolved (EF 55-60% on 04/22/15)     Liver abscess 01/05/2024   Metabolic syndrome    Myocardial infarction (HCC) 2015   Heart attack in 2015 and 2017   OSA (obstructive sleep apnea)    a.  mild not requiring CPAP at this time   PAF (paroxysmal atrial fibrillation) (HCC)    a. brief episode after cardiac cath on 05/16/15- resolved after IV amiodarone .   Sleep apnea    Streptococcal bacteremia 01/05/2024   Tinnitus    Varicose veins     Past Surgical History:  Procedure Laterality Date   CARDIAC CATHETERIZATION N/A 05/16/2015   Procedure: Left Heart Cath and Coronary Angiography;  Surgeon: Victory LELON Sharps, MD;  Location: [facility] INVASIVE CV LAB;  Service: Cardiovascular;  Laterality: N/A;   CARDIAC CATHETERIZATION N/A 05/16/2015   Procedure: Coronary Stent Intervention;  Surgeon: Victory LELON Sharps, MD;  Location[facility] INVASIVE CV LAB;  Service: Cardiovascular;  Laterality: N/A;    CARPAL TUNNEL RELEASE Bilateral 03/02/2015   Procedure: BILATERAL CARPAL TUNNEL RELEASE ;  Surgeon: Arley Curia, MD;  Location: [facility];  Service: Orthopedics;  Laterality: Bilateral;   COLONOSCOPY  07/21/2004   Dr. Rollin   CORONARY ANGIOPLASTY WITH STENT PLACEMENT  02/02/2014   BMS to RCA   ENDOVENOUS ABLATION SAPHENOUS VEIN W/ LASER Right 10/12/2014   EVLA right greater saphenous vein by Krystal Doing MD   ESOPHAGEAL DILATION  07/23/2021   Procedure: ESOPHAGEAL DILATION;  Surgeon: San Sandor GAILS, DO;  Location: [facility] OR;  Service: Endoscopy;;   ESOPHAGOGASTRODUODENOSCOPY N/A 07/23/2021   Procedure: ESOPHAGOGASTRODUODENOSCOPY (EGD);  Surgeon: San Sandor GAILS, DO;  Location: [facility] OR;  Service: Endoscopy;  Laterality: N/A;   HERNIA REPAIR  1953   Also 2023   HIATAL HERNIA REPAIR N/A 07/23/2021   Procedure: LAPAROSCOPIC REPAIR OF HIATAL HERNIA, LAPOROSCOPIC  TRANSVERSE ABDOMINIS PLANE (TAP) BLOCK ;  Surgeon: Tanda Locus, MD;  Location: [facility] OR;  Service: General;  Laterality: N/A;   LEFT HEART CATHETERIZATION WITH CORONARY ANGIOGRAM N/A 02/02/2014   Procedure: LEFT HEART CATHETERIZATION WITH CORONARY ANGIOGRAM;  Surgeon: Kahmya Pinkham M [place], MD;  Location: [facility] CATH LAB;  Service: Cardiovascular;  Laterality: N/A;   SKIN BIOPSY Left 06/19/2021   seborrheic keratosis   stab phlebectomy  Right 03/15/2015   stab phlebectomy > 20 incisions (right leg) by Krystal Doing MD   TRANSORAL INCISIONLESS FUNDOPLICATION N/A 07/23/2021   Procedure: TRANSORAL INCISIONLESS FUNDOPLICATION;  Surgeon: San Sandor GAILS, DO;  Location: [facility] OR;  Service: Endoscopy;  Laterality: N/A;    Current Medications: No outpatient medications have been marked as taking for the 03/16/24 encounter (Office Visit) with [place], Shaquela Weichert M, MD.     Allergies:   Ace inhibitors and Penicillins   Social History   Socioeconomic History   Marital status: Married    Spouse name: Not on file   Number of children: Not on file    Years of education: Not on file   Highest education level: Professional school degree (e.g., MD, DDS, DVM, JD)  Occupational History   Not on file  Tobacco Use   Smoking status: Former   Smokeless tobacco: Never  Vaping Use   Vaping status: Never Used  Substance and Sexual Activity   Alcohol use: Yes    Alcohol/week: 3.0 standard drinks of alcohol    Types: 3 Glasses of wine per week   Drug use: No   Sexual activity: Not Currently  Other Topics Concern   Not on file  Social History Narrative   Not on file   Social Drivers of Health   Financial Resource Strain: Low Risk  (01/08/2024)   Overall Financial Resource Strain (CARDIA)    Difficulty of Paying Living Expenses: Not hard at all  Food Insecurity: No Food Insecurity (01/08/2024)   Hunger Vital Sign    Worried About Running Out of Food in the Last Year: Never true    Ran Out of Food in the Last Year: Never true  Transportation Needs: No Transportation Needs (01/08/2024)   PRAPARE - Administrator, Civil Service (Medical): No    Lack of Transportation (Non-Medical): No  Physical Activity: Sufficiently Active (01/08/2024)   Exercise Vital Sign    Days of Exercise per Week: 7 days    Minutes of Exercise per Session: 30 min  Stress: No Stress Concern Present (01/08/2024)   [employer] of Occupational Health - Occupational Stress Questionnaire    Feeling of Stress: Only a little  Social Connections: Moderately Integrated (01/08/2024)   Social Connection and Isolation Panel    Frequency of Communication with Friends and Family: Twice a week    Frequency of Social Gatherings with Friends and Family: Once a week    Attends Religious Services: More than 4 times per year    Active Member of [employer] or Organizations: No    Attends Engineer, structural: Not on file    Marital Status: Married     Family History: The patient's family history includes Breast cancer in his mother; Cancer in his brother and  mother; Cervical cancer in his mother; Hearing loss in his father; Heart disease in his brother; Hypertension in his mother; Pancreatic cancer in his brother; Skin cancer in his mother. There is no history of Colon cancer or Esophageal cancer.  ROS:   Please see the history of present illness.     All other systems reviewed and are negative.  EKGs/Labs/Other Studies Reviewed:    The following studies were reviewed today:  Cath 05/16/2015 Mid RCA to Dist RCA lesion, 10% stenosed. The lesion was previously treated with a stent (unknown type). Prox RCA to Mid RCA lesion, 25% stenosed. Prox LAD to Dist LAD lesion, 40% stenosed. Dist RCA lesion, 100% stenosed. Post intervention, there is a 10% residual stenosis.   Acute inferior myocardial infarction presenting with cardiogenic shock. PTCA of the distal RCA from 100% to less than 10%. The vessel is large and ectatic containing heavy thrombus. Reperfusion was complicated by severe no reflow. Widely patent left coronary system. Intra-aortic balloon pump to improve diastolic flow and help resolve no reflow. Left ventriculography was not performed     RECOMMENDATIONS:   One-to-one intra-aortic balloon, pulsation for at least 24 hours. Intravenous Levophed  to keep a mean arterial pressure greater than 65 mmHg. Intravenous Aggrastat  for at least 24 hours Aspirin  and Brilinta  indefinitely. IV amiodarone  to treat acute atrial fibrillation. Prognosis is guarded. This was discussed with the patient's family in full detail. IV heparin  without a bolus to start in 8 hours. Beta blocker therapy as tolerated by blood pressure. 2-D Doppler echocardiogram 24-48 hours hence. Unfortunately, the patient will probably have a rocky post infarct course with potential for brady- and ventricular arrhythmias.   I personally reviewed records from [place] including Echo report.      Event monitor 01/06/24: Study Highlights Show Result Comparison     Normal sinus rhythm   One 12 beat run NSVT   Few brief runs of SVT longest lasting 11 beats.   Rare PACs   Occasional PVCs   No Afib seen     Patch Wear Time:  13 days and 5 hours (2025-06-02T15:49:42-0400 to 2025-06-15T21:10:51-0400)   Patient had a min HR of 57 bpm,  max HR of 226 bpm, and avg HR of 87 bpm. Predominant underlying rhythm was Sinus Rhythm. 1 run of Ventricular Tachycardia occurred lasting 12 beats with a max rate of 226 bpm (avg 173 bpm). 24 Supraventricular Tachycardia  runs occurred, the run with the fastest interval lasting 11 beats with a max rate of 188 bpm, the longest lasting 18.3 secs with an avg rate of 145 bpm. Some episodes of Supraventricular Tachycardia may be possible Atrial Tachycardia with variable block.  Isolated SVEs were rare (<1.0%), SVE Couplets were rare (<1.0%), and SVE Triplets were rare (<1.0%). Isolated VEs were occasional (1.8%, 25436), VE Couplets were rare (<1.0%, 87), and no VE Triplets were present. Ventricular Bigeminy and Trigeminy were  present.  Recent Labs: 12/16/2023: ALT 23; Hemoglobin 11.8; Platelets 319; Potassium 4.3; Sodium 137 02/02/2024: BUN 18; Creat 1.01  Recent Lipid Panel    Component Value Date/Time   CHOL 101 12/16/2023 1350   TRIG 182 (H) 12/16/2023 1350   HDL 27 (L) 12/16/2023 1350   CHOLHDL 3.7 12/16/2023 1350   CHOLHDL 2.9 02/19/2017 0843   VLDL 15 02/19/2017 0843   LDLCALC 44 12/16/2023 1350    Physical Exam:    VS:  BP 136/82 (BP Location: Left Arm, Patient Position: Sitting)   Pulse 76   Resp 18   Ht 5' 6 (1.676 m)   Wt 183 lb 12.8 oz (83.4 kg)   SpO2 96%   BMI 29.67 kg/m     Wt Readings from Last 3 Encounters:  03/16/24 183 lb 12.8 oz (83.4 kg)  03/07/24 175 lb (79.4 kg)  01/12/24 174 lb 3.2 oz (79 kg)     GEN:  Well nourished, well developed in no acute distress HEENT: Normal NECK: No JVD; No carotid bruits LYMPHATICS: No lymphadenopathy CARDIAC: RRR, no murmurs, rubs, gallops RESPIRATORY:   Clear to auscultation without rales, wheezing or rhonchi  ABDOMEN: Soft, non-tender, non-distended MUSCULOSKELETAL:  No edema; No deformity  SKIN: Warm and dry NEUROLOGIC:  Alert and oriented x 3 PSYCHIATRIC:  Normal affect   ASSESSMENT:    1. Coronary artery disease involving native coronary artery of native heart without angina pectoris   2. Hyperlipidemia LDL goal <70   3. PAF (paroxysmal atrial fibrillation) (HCC)          PLAN:    In order of problems listed above:  CAD: s/p inferior MI x 2. Aneurysmal RCA. Denies any recent anginal symptoms.  Continue aspirin  and maintenance Brilinta . Off Toprol  due to low BP before. BP slightly elevated today but has been normal at other doctor visits. Will monitor.   Hyperlipidemia: Continue statin. Last LDL 44  Syncope. April 2024. Etiology unclear. No recurrence  4.   Paroxysmal Afib in setting of sepsis/shock/hepatic abscesses. Subsequent event monitor showed no recurrent Afib so anticoagulation was discontinued in favor of DAPT.   5.  Polymicrobial hepatic abscesses. Unclear source. Clinically improved. Per ID.     Follow up in 6 months   Medication Adjustments/Labs and Tests Ordered: Current medicines are reviewed at length with the patient today.  Concerns regarding medicines are outlined above.  No orders of the defined types were placed in this encounter.   No orders of the defined types were placed in this encounter.    There are no Patient Instructions on file for this visit.   Signed, Nacole Fluhr [place], MD  03/16/2024 9:53 AM    [facility]

## 2024-03-05 NOTE — Progress Notes (Unsigned)
 Subjective:  Chief Complaint: follow-up for liver abscess and streptococcal bacteremia   Patient ID: Henry Rogers, male    DOB: [birth year], 73 y.o.   MRN: [id number]  HPI  Discussed the use of AI scribe software for clinical note transcription with the patient, who gave verbal consent to proceed.  History of Present Illness   Henry Rogers is a 73 year old male who presents for follow-up of a liver abscess and streptococcal bacteremia.  He was initially admitted to a hospital in [place] for a bloodstream infection and a large liver abscess measuring 12 x 10 x 11 cm, occupying nearly the entire right hepatic lobe. His hospital course was complicated by acute renal injury, sepsis, and atrial fibrillation. A percutaneous drain was placed, removing over 400 mL of purulent material.  During his hospitalization, he developed a rash after being on ibuprofen, leading to a change in his medication to oral amoxicillin  clavulanic acid, and subsequently to cefalexin and metronidazole . A CT scan on December 21, 2023, in [place] showed two persistent irregular fluid collections with parenchymal edema in the liver, the largest measuring 2.9 x 2.7 x 1.8 cm, and a smaller one measuring 3.1 x 2 cm.  He was placed on cefadroxil , two tablets twice daily, and metronidazole  was stopped. A repeat CT scan on February 29, 2024, showed an ill-defined heterogeneous area of attenuation in the central right lobe of the liver measuring 4.6 x 4.2 cm, in the region of the previously seen fluid collection, which measured 2.8 x 1 cm. There were also bilobular hypodensities in the liver.  The bloodstream infection was due to Streptococcus intermedius/anginosus.   He is feeling well and without any symptoms suggestive of active infection. He and his wife have noticed he has been trembling largely in hands on and off again in recent months.      Past Medical History:  Diagnosis Date   BPH (benign prostatic  hyperplasia)    CAD (coronary artery disease)    a. inf STEMI s/p BMS to mRCA (02/03/14)  b. inf STEMI s/p PCTA to RCA with slow reflow.   Chronic diastolic CHF (congestive heart failure) (HCC)    a. 2D echo on 05/18/15 LVEF 55-60%, akinesis basal inferior wall, G2DD     GERD (gastroesophageal reflux disease)    Hearing loss    Hemorrhoids    HH (hiatus hernia)    Ischemic cardiomyopathy    a. ECHO 01/2014 EF 45-50% and akinesis of the entire inferior myocardium. Mild MR.   b. now resolved (EF 55-60% on 04/22/15)     Liver abscess 01/05/2024   Metabolic syndrome    Myocardial infarction (HCC) 2015   Heart attack in 2015 and 2017   OSA (obstructive sleep apnea)    a. mild not requiring CPAP at this time   PAF (paroxysmal atrial fibrillation) (HCC)    a. brief episode after cardiac cath on 05/16/15- resolved after IV amiodarone .   Sleep apnea    Streptococcal bacteremia 01/05/2024   Tinnitus    Varicose veins     Past Surgical History:  Procedure Laterality Date   CARDIAC CATHETERIZATION N/A 05/16/2015   Procedure: Left Heart Cath and Coronary Angiography;  Surgeon: Victory LELON Sharps, MD;  Location: [facility] INVASIVE CV LAB;  Service: Cardiovascular;  Laterality: N/A;   CARDIAC CATHETERIZATION N/A 05/16/2015   Procedure: Coronary Stent Intervention;  Surgeon: Victory LELON Sharps, MD;  Location: [facility] INVASIVE CV LAB;  Service: Cardiovascular;  Laterality: N/A;  CARPAL TUNNEL RELEASE Bilateral 03/02/2015   Procedure: BILATERAL CARPAL TUNNEL RELEASE ;  Surgeon: Arley Curia, MD;  Location: [facility];  Service: Orthopedics;  Laterality: Bilateral;   COLONOSCOPY  07/21/2004   Dr. Rollin   CORONARY ANGIOPLASTY WITH STENT PLACEMENT  02/02/2014   BMS to RCA   ENDOVENOUS ABLATION SAPHENOUS VEIN W/ LASER Right 10/12/2014   EVLA right greater saphenous vein by Krystal Doing MD   ESOPHAGEAL DILATION  07/23/2021   Procedure: ESOPHAGEAL DILATION;  Surgeon: San Sandor GAILS, DO;  Location: [facility] OR;   Service: Endoscopy;;   ESOPHAGOGASTRODUODENOSCOPY N/A 07/23/2021   Procedure: ESOPHAGOGASTRODUODENOSCOPY (EGD);  Surgeon: San Sandor GAILS, DO;  Location: [facility] OR;  Service: Endoscopy;  Laterality: N/A;   HERNIA REPAIR  1953   Also 2023   HIATAL HERNIA REPAIR N/A 07/23/2021   Procedure: LAPAROSCOPIC REPAIR OF HIATAL HERNIA, LAPOROSCOPIC  TRANSVERSE ABDOMINIS PLANE (TAP) BLOCK ;  Surgeon: Tanda Locus, MD;  Location: [facility] OR;  Service: General;  Laterality: N/A;   LEFT HEART CATHETERIZATION WITH CORONARY ANGIOGRAM N/A 02/02/2014   Procedure: LEFT HEART CATHETERIZATION WITH CORONARY ANGIOGRAM;  Surgeon: Peter M Swaziland, MD;  Location: [facility] CATH LAB;  Service: Cardiovascular;  Laterality: N/A;   SKIN BIOPSY Left 06/19/2021   seborrheic keratosis   stab phlebectomy  Right 03/15/2015   stab phlebectomy > 20 incisions (right leg) by Krystal Doing MD   TRANSORAL INCISIONLESS FUNDOPLICATION N/A 07/23/2021   Procedure: TRANSORAL INCISIONLESS FUNDOPLICATION;  Surgeon: San Sandor GAILS, DO;  Location: [facility] OR;  Service: Endoscopy;  Laterality: N/A;    Family History  Problem Relation Age of Onset   Hypertension Mother    Cervical cancer Mother    Breast cancer Mother    Skin cancer Mother    Cancer Mother    Hearing loss Father    Pancreatic cancer Brother    Heart disease Brother    Cancer Brother    Colon cancer Neg Hx    Esophageal cancer Neg Hx       Social History   Socioeconomic History   Marital status: Married    Spouse name: Not on file   Number of children: Not on file   Years of education: Not on file   Highest education level: Professional school degree (e.g., MD, DDS, DVM, JD)  Occupational History   Not on file  Tobacco Use   Smoking status: Former   Smokeless tobacco: Never  Vaping Use   Vaping status: Never Used  Substance and Sexual Activity   Alcohol use: Yes    Alcohol/week: 3.0 standard drinks of alcohol    Types: 3 Glasses of wine per week   Drug use: No    Sexual activity: Not Currently  Other Topics Concern   Not on file  Social History Narrative   Not on file   Social Drivers of Health   Financial Resource Strain: Low Risk  (01/08/2024)   Overall Financial Resource Strain (CARDIA)    Difficulty of Paying Living Expenses: Not hard at all  Food Insecurity: No Food Insecurity (01/08/2024)   Hunger Vital Sign    Worried About Running Out of Food in the Last Year: Never true    Ran Out of Food in the Last Year: Never true  Transportation Needs: No Transportation Needs (01/08/2024)   PRAPARE - Administrator, Civil Service (Medical): No    Lack of Transportation (Non-Medical): No  Physical Activity: Sufficiently Active (01/08/2024)   Exercise Vital Sign  Days of Exercise per Week: 7 days    Minutes of Exercise per Session: 30 min  Stress: No Stress Concern Present (01/08/2024)   [employer] of Occupational Health - Occupational Stress Questionnaire    Feeling of Stress: Only a little  Social Connections: Moderately Integrated (01/08/2024)   Social Connection and Isolation Panel    Frequency of Communication with Friends and Family: Twice a week    Frequency of Social Gatherings with Friends and Family: Once a week    Attends Religious Services: More than 4 times per year    Active Member of [employer] or Organizations: No    Attends Engineer, structural: Not on file    Marital Status: Married    Allergies  Allergen Reactions   Ace Inhibitors Other (See Comments)    No personal hx reaction, but 2 family members have had persistent cough with the class and he prefers not to take ACEi.   Penicillins Rash     Current Outpatient Medications:    aspirin  81 MG tablet, Take 81 mg by mouth daily., Disp: , Rfl:    atorvastatin  (LIPITOR ) 80 MG tablet, TAKE 1 TABLET(80 MG) BY MOUTH DAILY AT 6 PM, Disp: 90 tablet, Rfl: 3   cefadroxil  (DURICEF) 500 MG capsule, Take 2 capsules (1,000 mg total) by mouth 2 (two) times daily.,  Disp: 360 capsule, Rfl: 2   finasteride  (PROSCAR ) 5 MG tablet, Take 1 tablet (5 mg total) by mouth daily., Disp: 90 tablet, Rfl: 3   LORazepam (ATIVAN) 1 MG tablet, Take 1 mg by mouth every 8 (eight) hours., Disp: , Rfl:    Multiple Vitamin (MULTIVITAMIN) tablet, Take 1 tablet by mouth every evening. 50 Plus, Disp: , Rfl:    nitroGLYCERIN  (NITROSTAT ) 0.4 MG SL tablet, Place 1 tablet (0.4 mg total) under the tongue every 5 (five) minutes as needed for chest pain. MAX 3 doses, Disp: 25 tablet, Rfl: 11   Nutritional Supplements (JUICE PLUS FIBRE PO), Take 1 tablet by mouth 2 (two) times daily. Fruit blend and vegetable blend, Disp: , Rfl:    omega-3 acid ethyl esters (LOVAZA ) 1 g capsule, Take 2 capsules (2 g total) by mouth 2 (two) times daily., Disp: 360 capsule, Rfl: 1   omeprazole  (PRILOSEC) 40 MG capsule, Take 40 mg by mouth daily., Disp: , Rfl:    solifenacin  (VESICARE ) 5 MG tablet, Take 1 tablet (5 mg total) by mouth daily., Disp: 90 tablet, Rfl: 1   tadalafil  (CIALIS ) 5 MG tablet, TAKE 1 TABLET(5 MG) BY MOUTH DAILY, Disp: 90 tablet, Rfl: 3   ticagrelor  (BRILINTA ) 60 MG TABS tablet, Take 1 tablet (60 mg total) by mouth 2 (two) times daily. (Patient not taking: Reported on 01/12/2024), Disp: 180 tablet, Rfl: 2   zolpidem  (AMBIEN ) 5 MG tablet, Take 1 tablet (5 mg total) by mouth at bedtime as needed. for sleep, Disp: 30 tablet, Rfl: 0   Review of Systems  Constitutional:  Negative for activity change, appetite change, chills, diaphoresis, fatigue, fever and unexpected weight change.  HENT:  Negative for congestion, rhinorrhea, sinus pressure, sneezing, sore throat and trouble swallowing.   Eyes:  Negative for photophobia and visual disturbance.  Respiratory:  Negative for cough, chest tightness, shortness of breath, wheezing and stridor.   Cardiovascular:  Negative for chest pain, palpitations and leg swelling.  Gastrointestinal:  Negative for abdominal distention, abdominal pain, anal bleeding,  blood in stool, constipation, diarrhea, nausea and vomiting.  Genitourinary:  Negative for difficulty urinating, dysuria, flank  pain and hematuria.  Musculoskeletal:  Negative for arthralgias, back pain, gait problem, joint swelling and myalgias.  Skin:  Negative for color change, pallor, rash and wound.  Neurological:  Positive for tremors. Negative for dizziness, weakness and light-headedness.  Hematological:  Negative for adenopathy. Does not bruise/bleed easily.  Psychiatric/Behavioral:  Negative for agitation, behavioral problems, confusion, decreased concentration, dysphoric mood and sleep disturbance.        Objective:   Physical Exam Constitutional:      Appearance: He is well-developed.  HENT:     Head: Normocephalic and atraumatic.  Eyes:     Conjunctiva/sclera: Conjunctivae normal.  Cardiovascular:     Rate and Rhythm: Normal rate and regular rhythm.  Pulmonary:     Effort: Pulmonary effort is normal. No respiratory distress.     Breath sounds: No wheezing.  Abdominal:     General: There is no distension.     Palpations: Abdomen is soft.  Musculoskeletal:        General: No tenderness. Normal range of motion.     Cervical back: Normal range of motion and neck supple.  Skin:    General: Skin is warm and dry.     Coloration: Skin is not pale.     Findings: No erythema or rash.  Neurological:     General: No focal deficit present.     Mental Status: He is alert and oriented to person, place, and time.  Psychiatric:        Mood and Affect: Mood normal.        Behavior: Behavior normal.        Thought Content: Thought content normal.        Judgment: Judgment normal.    Slight tremor when raising his arms       Assessment & Plan:   Assessment and Plan    Resolving liver abscess Liver abscess resolving from 12x10x11 cm to 4.6x4.2 cm. No drainable fluid. I discussed the CT scan with radiologist (different than one who originally read the scan) He said that  the liver parenchyma was abnormal and that the presumption is that this is related to infection of course but nothing drainable --will get MRI of the liver w and without contrast to better evaluate this - Continue cefadroxil  two tablets twice daily. --if MRI suggests there is still active infection will continue antibiotics and get another MRI in next 6 weeks or so per Radiology's recommendations re timing  of imaging   Tremor: not clear what this is due to. He is off metronidazole  which can cause neuropathy but this does not sound like a neuropathy  Resolved streptococcal bacteremia Streptococcal bacteremia resolved after treatment with antibiotics. Currently on cefadroxil .      PAF: seeing Peter Swaziland next week

## 2024-03-07 ENCOUNTER — Other Ambulatory Visit: Payer: Self-pay

## 2024-03-07 ENCOUNTER — Ambulatory Visit: Admitting: Infectious Disease

## 2024-03-07 ENCOUNTER — Encounter: Payer: Self-pay | Admitting: Infectious Disease

## 2024-03-07 VITALS — BP 127/79 | HR 83 | Temp 98.0°F | Ht 66.0 in | Wt 175.0 lb

## 2024-03-07 DIAGNOSIS — I480 Paroxysmal atrial fibrillation: Secondary | ICD-10-CM

## 2024-03-07 DIAGNOSIS — K750 Abscess of liver: Secondary | ICD-10-CM | POA: Diagnosis not present

## 2024-03-07 DIAGNOSIS — R251 Tremor, unspecified: Secondary | ICD-10-CM | POA: Diagnosis not present

## 2024-03-07 DIAGNOSIS — B955 Unspecified streptococcus as the cause of diseases classified elsewhere: Secondary | ICD-10-CM | POA: Diagnosis not present

## 2024-03-07 DIAGNOSIS — R7881 Bacteremia: Secondary | ICD-10-CM

## 2024-03-07 DIAGNOSIS — K769 Liver disease, unspecified: Secondary | ICD-10-CM

## 2024-03-09 ENCOUNTER — Telehealth: Payer: Self-pay

## 2024-03-09 NOTE — Telephone Encounter (Signed)
 Copied from CRM #[id number]. Topic: Clinical - Lab/Test Results >> Mar 09, 2024  3:31 PM Fonda T wrote: Reason for CRM: Received call from Almarie with [employer], calling on behalf of patient, requesting most recent lab results for all recent labs as well CBC, and CMP.   States office received surgery clearance but will also need above labs.  Can fax labs to [phone].  Can be reached at phone [phone], if need to speak further.    Labs were faxed, and called Almarie she is showing labs to the surgeon and will be contacting if further labs are needed.

## 2024-03-10 NOTE — Telephone Encounter (Signed)
 Labs were faxed

## 2024-03-11 ENCOUNTER — Ambulatory Visit
Admission: RE | Admit: 2024-03-11 | Discharge: 2024-03-11 | Disposition: A | Discharge status: Home / Self Care | Source: Ambulatory Visit | Attending: Infectious Disease | Admitting: Infectious Disease

## 2024-03-11 DIAGNOSIS — K7689 Other specified diseases of liver: Secondary | ICD-10-CM | POA: Diagnosis not present

## 2024-03-11 DIAGNOSIS — K769 Liver disease, unspecified: Secondary | ICD-10-CM

## 2024-03-11 MED ORDER — GADOPICLENOL 0.5 MMOL/ML IV SOLN
8.0000 mL | Freq: Once | INTRAVENOUS | Status: AC | PRN
  Administered 2024-03-11: 8 mL via INTRAVENOUS

## 2024-03-14 ENCOUNTER — Ambulatory Visit: Payer: Self-pay

## 2024-03-14 ENCOUNTER — Other Ambulatory Visit: Payer: Self-pay | Admitting: Infectious Disease

## 2024-03-14 DIAGNOSIS — K750 Abscess of liver: Secondary | ICD-10-CM

## 2024-03-14 DIAGNOSIS — K769 Liver disease, unspecified: Secondary | ICD-10-CM

## 2024-03-16 ENCOUNTER — Encounter: Payer: Self-pay | Admitting: Cardiology

## 2024-03-16 ENCOUNTER — Ambulatory Visit: Attending: Cardiology | Admitting: Cardiology

## 2024-03-16 VITALS — BP 136/82 | HR 76 | Resp 18 | Ht 66.0 in | Wt 183.8 lb

## 2024-03-16 DIAGNOSIS — E785 Hyperlipidemia, unspecified: Secondary | ICD-10-CM

## 2024-03-16 DIAGNOSIS — I480 Paroxysmal atrial fibrillation: Secondary | ICD-10-CM

## 2024-03-16 DIAGNOSIS — I2510 Atherosclerotic heart disease of native coronary artery without angina pectoris: Secondary | ICD-10-CM

## 2024-03-16 NOTE — Patient Instructions (Signed)
 Medication Instructions:  Continue same medications *If you need a refill on your cardiac medications before your next appointment, please call your pharmacy*  Lab Work: None ordered  Testing/Procedures: None ordered  Follow-Up: At [facility], you and your health needs are our priority.  As part of our continuing mission to provide you with exceptional heart care, our providers are all part of one team.  This team includes your primary Cardiologist (physician) and Advanced Practice Providers or APPs (Physician Assistants and Nurse Practitioners) who all work together to provide you with the care you need, when you need it.  Your next appointment:  6 months   Call in Oct to schedule Feb appointment    Provider:  Dr.Jordan     Blood Pressure Monitor  ( [employer] )  Monitor blood pressure daily   Call if still elevated  We recommend signing up for the patient portal called MyChart.  Sign up information is provided on this After Visit Summary.  MyChart is used to connect with patients for Virtual Visits (Telemedicine).  Patients are able to view lab/test results, encounter notes, upcoming appointments, etc.  Non-urgent messages can be sent to your provider as well.   To learn more about what you can do with MyChart, go to [email or web address].

## 2024-03-18 ENCOUNTER — Other Ambulatory Visit

## 2024-03-25 ENCOUNTER — Telehealth: Payer: Self-pay | Admitting: Family Medicine

## 2024-03-25 NOTE — Telephone Encounter (Signed)
 Copied from CRM [id number]. Topic: Clinical - Medication Question >> Mar 25, 2024 12:09 PM Henry Rogers wrote: Reason for RMF:Mzvlzdupwh Pfizer Covid vaccine be sent to [facility].

## 2024-03-28 ENCOUNTER — Telehealth: Payer: Self-pay | Admitting: Family Medicine

## 2024-03-28 NOTE — Telephone Encounter (Signed)
 Patient came in to obtain RX for covid. - gave rx

## 2024-04-10 NOTE — Progress Notes (Unsigned)
   Subjective:  Chief complaint: follow-up for liver abscesses   Patient ID: Henry Rogers, male    DOB: 1951-03-27, 73 y.o.   MRN: [id number]  HPI  Discussed the use of AI scribe software for clinical note transcription with the patient, who gave verbal consent to proceed.  History of Present Illness     Review of Systems     Objective:   Physical Exam        Assessment & Plan:

## 2024-04-11 ENCOUNTER — Other Ambulatory Visit: Payer: Self-pay

## 2024-04-11 ENCOUNTER — Ambulatory Visit: Payer: Self-pay | Admitting: Infectious Disease

## 2024-04-11 VITALS — BP 129/87 | HR 102 | Temp 97.7°F | Resp 16 | Wt 184.4 lb

## 2024-04-11 DIAGNOSIS — K750 Abscess of liver: Secondary | ICD-10-CM

## 2024-04-11 DIAGNOSIS — I480 Paroxysmal atrial fibrillation: Secondary | ICD-10-CM | POA: Diagnosis not present

## 2024-04-11 DIAGNOSIS — B955 Unspecified streptococcus as the cause of diseases classified elsewhere: Secondary | ICD-10-CM

## 2024-04-11 DIAGNOSIS — R7881 Bacteremia: Secondary | ICD-10-CM

## 2024-04-11 DIAGNOSIS — Z23 Encounter for immunization: Secondary | ICD-10-CM

## 2024-04-20 ENCOUNTER — Encounter: Payer: Self-pay | Admitting: Infectious Disease

## 2024-04-21 ENCOUNTER — Other Ambulatory Visit: Payer: Self-pay

## 2024-04-21 ENCOUNTER — Telehealth: Payer: Self-pay

## 2024-04-21 DIAGNOSIS — K219 Gastro-esophageal reflux disease without esophagitis: Secondary | ICD-10-CM

## 2024-04-21 MED ORDER — OMEPRAZOLE 20 MG PO CPDR: 20.0000 mg | DELAYED_RELEASE_CAPSULE | Freq: Every day | ORAL | 3 refills | Status: DC

## 2024-04-21 NOTE — Telephone Encounter (Signed)
 Patient requesting you to send in Omeprazole  20 mg into [facility] @ [address] Prescription last prescribed by historical provider.

## 2024-04-21 NOTE — Telephone Encounter (Signed)
 Copied from CRM #[id number]. Topic: Clinical - Medication Question >> Apr 21, 2024 11:45 AM Debby BROCKS wrote: Reason for CRM: Patient is taking omeprazole  (PRILOSEC) 40 MG capsule but has been taking the 20mg  variation for the last 3 weeks and its been working just fine. He would like the dosage to be decreased from 40 to 20 on the next prescription    I refilled Rx Omeprazole  20 mg.

## 2024-05-03 DIAGNOSIS — D485 Neoplasm of uncertain behavior of skin: Secondary | ICD-10-CM | POA: Diagnosis not present

## 2024-05-25 ENCOUNTER — Ambulatory Visit: Payer: Self-pay

## 2024-05-25 ENCOUNTER — Ambulatory Visit (HOSPITAL_COMMUNITY)
Admission: RE | Admit: 2024-05-25 | Discharge: 2024-05-25 | Disposition: A | Discharge status: Home / Self Care | Source: Ambulatory Visit | Attending: Infectious Disease | Admitting: Infectious Disease

## 2024-05-25 DIAGNOSIS — R160 Hepatomegaly, not elsewhere classified: Secondary | ICD-10-CM | POA: Diagnosis not present

## 2024-05-25 DIAGNOSIS — K769 Liver disease, unspecified: Secondary | ICD-10-CM | POA: Diagnosis not present

## 2024-05-25 DIAGNOSIS — K7689 Other specified diseases of liver: Secondary | ICD-10-CM | POA: Diagnosis not present

## 2024-05-25 DIAGNOSIS — K750 Abscess of liver: Secondary | ICD-10-CM | POA: Insufficient documentation

## 2024-05-25 MED ORDER — GADOBUTROL 1 MMOL/ML IV SOLN
8.0000 mL | Freq: Once | INTRAVENOUS | Status: AC | PRN
  Administered 2024-05-25: 8 mL via INTRAVENOUS

## 2024-05-30 ENCOUNTER — Other Ambulatory Visit: Payer: Self-pay

## 2024-05-30 ENCOUNTER — Telehealth (INDEPENDENT_AMBULATORY_CARE_PROVIDER_SITE_OTHER): Payer: Self-pay | Admitting: Infectious Diseases

## 2024-05-30 ENCOUNTER — Encounter: Payer: Self-pay | Admitting: Infectious Diseases

## 2024-05-30 DIAGNOSIS — B954 Other streptococcus as the cause of diseases classified elsewhere: Secondary | ICD-10-CM

## 2024-05-30 DIAGNOSIS — K750 Abscess of liver: Secondary | ICD-10-CM

## 2024-05-30 DIAGNOSIS — R7881 Bacteremia: Secondary | ICD-10-CM | POA: Diagnosis not present

## 2024-05-30 NOTE — Progress Notes (Signed)
 Virtual Visit via Video Note  I connected with Henry Rogers on 05/30/24 at  9:15 AM EST by a video enabled telemedicine application and verified that I am speaking with the correct person using two identifiers.  Location: Patient: Patient [place] residence  Provider: [facility] Office    I discussed the limitations of evaluation and management by telemedicine and the availability of in person appointments. The patient expressed understanding and agreed to proceed.  History of Present Illness: Discussed the use of AI scribe software for clinical note transcription with the patient, who gave verbal consent to proceed.  History of Present Illness   Henry Rogers is a 73 year old male who presents for a two-month follow-up of a liver abscess. He is accompanied by his wife. He has been following with Dr. Fleeta Rothman for this problem since June 2025.  The liver abscess was initially diagnosed in [place], associated with a concurrent bloodstream infection due to streptococcus intermedius/Streptococcus anginosus. The abscess was large, measuring 12 x 10 x 11 cm, occupying nearly the entire right hepatic lobe. A percutaneous drain was placed, removing over 400 mL of material. Subsequent imaging in June showed two persistent irregular fluid collections with parenchymal edema within the liver, measuring 2.9 x 2.7 x 1.8 cm and a smaller one measuring 3.2 x 2 cm. The only symptom he had at that time was more URI symptoms (persistent cough - in hindsight from diaphragm irritation).   He has been on cefadroxil  1000 mg BID since June 2025, 1000 mg BID. A recent MRI on November 5th showed scattered cysts with one resolving abscess/phlegmatous material. Previous imaging in August showed persistent ill-defined areas with bilobar hypodensities and an amorphous heterogeneous hypo enhancement diffusion restriction measuring 4.3 x 3.5 cm. He is currently tolerating the antibiotics well without notable side  effects.  He is wondering what the risks are        Observations/Objective: Well appearing. No distress. Pleasant. Wife present as well   No results found for: ESRSEDRATE, CRP   Assessment and Plan:    Resolving liver abscess (under surveillance) - Streptococcus intermedius/Streptococcus anginosus secondary bacteremia, June 2025 - Recent MRI on November 5th showed scattered cysts with one resolving abscess/phlegmatous material. Previous CT scans showed persistent fluid collections and parenchymal edema. The abscess has shown improvement with drainage and long-term antibiotics. Risks of continuing antibiotics include potential resistance and disruption of gut microbiome discussed.  **Discussed that resolution on imaging can lag behind clinical response by up to 8 weeks and can take up to 6 months to look normal. He has had 5 months of targeted antibiotic therapy and after review of sequential images and visit to visit improvements noted, I think he would be at a good safe point to stop and observe off antibiotics. I would recommend he have 1 week of antibiotics to resume quickly on hand if he developed recurrent symptoms of infection in the weeks after stopping (fevers, chills, sweating, weight loss unexplained, abdominal symptoms, etc).  - he will discuss with his family about stopping antibiotics after he completes this current bottle  - Monitor for symptoms of infection such as fevers, chills, sweats, and abdominal pain. - Plan for follow-up imaging in early January to assess resolution with an appointment with Dr. Fleeta Rothman again.        Follow Up Instructions: Discuss with family how he would like to proceed and email me  Formally recommended we an stop and watch off  abx  Would have 1 week on demand cefadroxil  to resume if he has illness in the weeks after stopping  FU imaging in 32m and visit with Dr. Fleeta Rothman.      I discussed the assessment and treatment plan with the patient.  The patient was provided an opportunity to ask questions and all were answered. The patient agreed with the plan and demonstrated an understanding of the instructions.   The patient was advised to call back or seek an in-person evaluation if the symptoms worsen or if the condition fails to improve as anticipated.   Henry Fireman, NP  I personally spent a total of 28 minutes in the care of the patient today including preparing to see the patient, getting/reviewing separately obtained history, placing orders, documenting clinical information in the EHR, independently interpreting results, communicating results, and coordinating care.

## 2024-06-09 ENCOUNTER — Other Ambulatory Visit: Payer: Self-pay | Admitting: Family Medicine

## 2024-06-09 DIAGNOSIS — N400 Enlarged prostate without lower urinary tract symptoms: Secondary | ICD-10-CM

## 2024-06-09 DIAGNOSIS — G4733 Obstructive sleep apnea (adult) (pediatric): Secondary | ICD-10-CM

## 2024-06-09 NOTE — Telephone Encounter (Signed)
 Is this okay to refill?

## 2024-06-10 DIAGNOSIS — H10413 Chronic giant papillary conjunctivitis, bilateral: Secondary | ICD-10-CM | POA: Diagnosis not present

## 2024-06-10 DIAGNOSIS — H40013 Open angle with borderline findings, low risk, bilateral: Secondary | ICD-10-CM | POA: Diagnosis not present

## 2024-06-10 DIAGNOSIS — H04123 Dry eye syndrome of bilateral lacrimal glands: Secondary | ICD-10-CM | POA: Diagnosis not present

## 2024-06-10 DIAGNOSIS — G4730 Sleep apnea, unspecified: Secondary | ICD-10-CM | POA: Diagnosis not present

## 2024-06-10 DIAGNOSIS — H2513 Age-related nuclear cataract, bilateral: Secondary | ICD-10-CM | POA: Diagnosis not present

## 2024-06-10 DIAGNOSIS — H43813 Vitreous degeneration, bilateral: Secondary | ICD-10-CM | POA: Diagnosis not present

## 2024-06-15 ENCOUNTER — Other Ambulatory Visit: Payer: Self-pay | Admitting: General Practice

## 2024-06-15 DIAGNOSIS — I2510 Atherosclerotic heart disease of native coronary artery without angina pectoris: Secondary | ICD-10-CM

## 2024-06-15 MED ORDER — TICAGRELOR 60 MG PO TABS
60.0000 mg | ORAL_TABLET | Freq: Two times a day (BID) | ORAL | 1 refills | Status: AC
Start: 2024-06-15 — End: ?

## 2024-06-17 ENCOUNTER — Other Ambulatory Visit: Payer: Self-pay | Admitting: Family Medicine

## 2024-06-17 DIAGNOSIS — N3941 Urge incontinence: Secondary | ICD-10-CM

## 2024-06-17 DIAGNOSIS — N400 Enlarged prostate without lower urinary tract symptoms: Secondary | ICD-10-CM

## 2024-07-24 ENCOUNTER — Encounter: Payer: Self-pay | Admitting: Infectious Disease

## 2024-07-27 ENCOUNTER — Encounter: Payer: Self-pay | Admitting: Infectious Disease

## 2024-08-01 ENCOUNTER — Other Ambulatory Visit: Payer: Self-pay | Admitting: Infectious Disease

## 2024-08-01 DIAGNOSIS — K750 Abscess of liver: Secondary | ICD-10-CM

## 2024-08-10 ENCOUNTER — Other Ambulatory Visit (HOSPITAL_COMMUNITY)

## 2024-08-10 ENCOUNTER — Ambulatory Visit (HOSPITAL_COMMUNITY)
Admission: RE | Admit: 2024-08-10 | Discharge: 2024-08-10 | Disposition: A | Discharge status: Home / Self Care | Source: Ambulatory Visit | Attending: Infectious Disease | Admitting: Infectious Disease

## 2024-08-10 DIAGNOSIS — K750 Abscess of liver: Secondary | ICD-10-CM | POA: Insufficient documentation

## 2024-08-10 MED ORDER — GADOBUTROL 1 MMOL/ML IV SOLN
8.0000 mL | Freq: Once | INTRAVENOUS | Status: AC | PRN
  Administered 2024-08-10: 8 mL via INTRAVENOUS

## 2024-08-16 ENCOUNTER — Ambulatory Visit: Payer: Self-pay

## 2024-08-16 ENCOUNTER — Telehealth: Payer: Self-pay | Admitting: Gastroenterology

## 2024-08-16 NOTE — Telephone Encounter (Signed)
 Pt stated that  he had a TIF three years ago and had been great with his symptoms until the last couple weeks. Pt stated that [place] and had a liver abscess and was treated with high doses of antibiotics and omeprazole .  Pt stated the last week that his reflux is really bad to the point of throwing up. Pt was scheduled to see Alan Coombs PA on 08/18/2024 at 8:40 PM.  Pt made aware. Pt verbalized understanding with all questions answered.

## 2024-08-16 NOTE — Telephone Encounter (Signed)
 PT is calling to speak with a nurse in regards to his new symptoms. He had a TIF procedure done 3 years ago and has recently been having excruciating heartburn. He is willing to have an OV but would just like to discuss some further details. Please advise.

## 2024-08-17 NOTE — Progress Notes (Addendum)
 "    08/18/2024 Henry Rogers [id number] 05-22-1951  Referring provider: Joyce Norleen BROCKS, MD Primary GI doctor: Dr. San  ASSESSMENT AND PLAN:  GERD with history of hiatal hernia Status post TIF and fundoplication 07/23/2021 [id number] EGD with Dr. Rollin will get records Was on omeprazole  20 mg Jan 6th, symptoms of epigastric pain with some vomiting occurred 1-2 days after stopping Potentially related to abnormal flora from liver abscess and high dose antibiotics, rebound GERD, without reflux symptoms I suspect the integrity is still in place PLAN: - will get EGD report from Dr. Rollin -Will proceed with barium swallow first to evaluate integrity of wrap and valve -will discuss with Dr. San about potential EGD plus or minus Bravo - add on pepcid  40 mg daily - take omeprazole  as needed -Lifestyle changes discussed, avoid NSAIDS, ETOH, hand out given to the patient ADDENDUM: 01/14/2024 EGD with Dr. Phebe shows normal esophagus, fundoplication intact entire stomach was normal normal examined duodenum no specimens collected  Liver abscess with sepsis sepsis due to Streptococcus intermedius and parvinonas micra  Has been off antibiotics (cefadroxil ) since this month, last dose Jan 6th 08/10/2024 MRI liver with and without contrast liver abscess resolved  Personal history of colon polyps Colonoscopy Dr. Rollin 03/2015 for screening: findings 2mm ascending colon polyp, sigmoid diverticulosis. Polyp was unremarkable mucosa on path, not precancerous polyp.   Had recent colonoscopy with Dr. Rollin Addendum: 01/14/2024 colonoscopy Dr. Phebe excellent bowel prep multiple diverticula sigmoid colon, 2 sessile polyps cecum and transverse colon 1 to 3 mm in size pathology showed sessile serrated adenoma and tubular adenomatous polyp.  No recall colonoscopy  CAD  s/p STEMI 2015 with RCA stent  on Brilinta , ASA  Ischemic cardiomyopathy/CHF Echocardiogram in [place] 08/2023  normal LV size,  preserved LVEF mild TR no vegetations  paroxysmal A. fib  No DOAC  Patient Care Team: Joyce Norleen BROCKS, MD as PCP - General (Family Medicine) Jordan, Peter M, MD as PCP - Cardiology (Cardiology) Jordan, Peter M, MD as Consulting Physician (Cardiology) San Sandor GAILS, DO as Consulting Physician (Gastroenterology)  HISTORY OF PRESENT ILLNESS: 74 y.o. male with a past medical history of CAD  s/p STEMI 2015 with RCA stent (on Brilinta , ASA), CHF (EF 55-60% in 2016), ischemic cardiomyopathy, OSA, paroxysmal A. fib (no anticoagulation), BPH, GERD with HH status post TIF and medication repair January 2023 and others listed below presents for evaluation of GERD.   Last seen via video visit 05/22/2022 by Dr. San for follow-up status post TIF with fundoplication January 2023. Patient called 08/16/2024 stating he was doing well until February 2025 was treated for liver abscess with antibiotics and omeprazole  while in [place], within the last week has had worsening reflux with the need to vomit.  He was diagnosed with sepsis due to Streptococcus intermedius and parvinonas micra  with acute liver failure unknown source.  Discussed the use of AI scribe software for clinical note transcription with the patient, who gave verbal consent to proceed.  History of Present Illness   Henry Rogers is a 74 year old male with prior TIF fundoplication who presents for evaluation of severe rebound heartburn and vomiting after discontinuing omeprazole .  Underwent transoral incisionless fundoplication (TIF) in January 2023 for gastroesophageal reflux disease, resulting in excellent symptom control and discontinuation of acid-suppressive medications. Remained asymptomatic until developing a liver abscess and sepsis during a trip to [place]. Extensive workup, including CT scan, echocardiogram, and endoscopy, did not identify a source.  After returning to the [place] , underwent further endoscopy  and colonoscopy; no etiology was found.  During hospitalization for the liver abscess, started on omeprazole  40 mg daily with intravenous antibiotics. Reduced omeprazole  to 20 mg daily upon return to the [place]  and continued this dose while on cefadroxil  from May 2025 until July 26, 2024. Discontinued omeprazole  a few days after stopping antibiotics in January 2026.  Within 1-2 days of stopping omeprazole , developed severe rebound heartburn, described as the worst he has ever experienced, localized to the epigastric region without radiation. Experienced nausea and several episodes of vomiting, with the most recent episode on Tuesday of this week. Denies sensation of food or pills getting stuck and denies dysphagia. Has not resumed omeprazole  but has used Rolaids approximately four times and Pepcid  several times over the past two weeks, which have provided some relief. Has also been sleeping with the head of the bed elevated, though he prefers not to do so. Wishes to avoid resuming acid-suppressive medications and is concerned about the integrity of his TIF procedure.  Does not use NSAIDs, Aleve, ibuprofen, or Goody Powders. Drinks a mixed drink in the late afternoon and one to two glasses of wine after dinner most days, though has curtailed this since his illness. No tobacco or illicit drug use.  Denies trouble swallowing, black stools, diarrhea, constipation, or abdominal pain. Heartburn is worse later in the day and can be triggered by certain positions, such as bending over to tie his shoes. Has not experienced significant bloating or discomfort outside of the described symptoms.        He  reports that he has quit smoking. He has never used smokeless tobacco. He reports current alcohol use of about 3.0 standard drinks of alcohol per week. He reports that he does not use drugs.  RELEVANT GI HISTORY, IMAGING AND LABS: Results   Radiology Abdominal MRI (08/10/2024): No evidence of active  hepatic abscess; infection resolved Abdominal CT: No source of infection identified  Diagnostic Echocardiogram: No source of infection identified EGD (12/23/2023): Normal; no fistula or abnormal opening identified; etiology of prior infection undetermined Colonoscopy (12/23/2023): Normal     GERD history: -Index symptoms: Waterbrash, heartburn, regurgitation, dyspepsia, nocturnal regurgitation, dry cough. No dysphagia -Exacerbating features: Regurgitation with forward flexion,  -Medications trialed: Zantac, Prilosec, Pepcid  -Current medications: Prilosec 40 mg QAM, Pepcid  20 mg qhs. Sleeps with HOB elevated -Complications: Hiatal hernia   GERD evaluation: -Last EGD: 07/2021 -Barium esophagram: 02/2021: Normal motility, small HH.  Severe gastroesophageal reflux to the level of thoracic inlet -Esophageal Manometry: None -pH/Impedance: None -Bravo: None   Endoscopic History: - EGD (06/2018): Irregular but nonnodular Z-line - EGD (02/2021): 2-3 cm HH, Hill grade 3 valve - cTIF (07/23/2021):LA Grade A esophagitis.  30 Serofuse fasteners placed.     -  Colonoscopy Dr. Rollin 03/2015 for screening: findings 2mm ascending colon polyp, sigmoid diverticulosis. Polyp was unremarkable mucosa on path, not precancerous polyp.  He needs recall colonoscopy 03/2025 CBC    Component Value Date/Time   WBC 8.5 12/16/2023 1350   WBC 5.4 11/16/2022 2232   RBC 3.83 (L) 12/16/2023 1350   RBC 4.62 11/16/2022 2232   HGB 11.8 (L) 12/16/2023 1350   HCT 37.3 (L) 12/16/2023 1350   PLT 319 12/16/2023 1350   MCV 97 12/16/2023 1350   MCH 30.8 12/16/2023 1350   MCH 32.3 11/16/2022 2232   MCHC 31.6 12/16/2023 1350   MCHC 34.3 11/16/2022 2232   RDW 13.5 12/16/2023 1350  LYMPHSABS 4.1 (H) 12/16/2023 1350   MONOABS 0.5 11/16/2022 2232   EOSABS 0.2 12/16/2023 1350   BASOSABS 0.0 12/16/2023 1350   Recent Labs    09/11/23 1124 12/16/23 1350  HGB 16.1 11.8*    CMP     Component Value Date/Time   NA 137  12/16/2023 1350   K 4.3 12/16/2023 1350   CL 100 12/16/2023 1350   CO2 21 12/16/2023 1350   GLUCOSE 90 12/16/2023 1350   GLUCOSE 142 (H) 11/16/2022 2232   BUN 18 02/02/2024 1001   BUN 15 12/16/2023 1350   CREATININE 1.01 02/02/2024 1001   CALCIUM  9.0 12/16/2023 1350   PROT 6.4 12/16/2023 1350   ALBUMIN 3.4 (L) 12/16/2023 1350   AST 23 12/16/2023 1350   ALT 23 12/16/2023 1350   ALKPHOS 195 (H) 12/16/2023 1350   BILITOT 0.4 12/16/2023 1350   GFRNONAA >60 11/16/2022 2232   GFRAA 69 03/13/2020 1100      Latest Ref Rng & Units 12/16/2023    1:50 PM 09/11/2023   11:24 AM 11/16/2022   10:32 PM  Hepatic Function  Total Protein 6.0 - 8.5 g/dL 6.4  6.6  6.5   Albumin 3.8 - 4.8 g/dL 3.4  4.6  4.0   AST 0 - 40 IU/L 23  23  27    ALT 0 - 44 IU/L 23  25  26    Alk Phosphatase 44 - 121 IU/L 195  71  61   Total Bilirubin 0.0 - 1.2 mg/dL 0.4  0.8  0.8       Current Medications:   Current Outpatient Medications (Cardiovascular):    atorvastatin  (LIPITOR ) 80 MG tablet, TAKE 1 TABLET(80 MG) BY MOUTH DAILY AT 6 PM   nitroGLYCERIN  (NITROSTAT ) 0.4 MG SL tablet, Place 1 tablet (0.4 mg total) under the tongue every 5 (five) minutes as needed for chest pain. MAX 3 doses   omega-3 acid ethyl esters (LOVAZA ) 1 g capsule, Take 2 capsules (2 g total) by mouth 2 (two) times daily.   tadalafil  (CIALIS ) 5 MG tablet, TAKE 1 TABLET(5 MG) BY MOUTH DAILY  Current Outpatient Medications (Analgesics):    aspirin  81 MG tablet, Take 81 mg by mouth daily.  Current Outpatient Medications (Hematological):    ticagrelor  (BRILINTA ) 60 MG TABS tablet, Take 1 tablet (60 mg total) by mouth 2 (two) times daily.  Current Outpatient Medications (Other):    cefadroxil  (DURICEF) 500 MG capsule, Take 2 capsules (1,000 mg total) by mouth 2 (two) times daily.   famotidine  (PEPCID ) 40 MG tablet, Take 1 tablet (40 mg total) by mouth at bedtime.   finasteride  (PROSCAR ) 5 MG tablet, Take 1 tablet (5 mg total) by mouth daily.    LORazepam (ATIVAN) 1 MG tablet, Take 1 mg by mouth every 8 (eight) hours.   Multiple Vitamin (MULTIVITAMIN) tablet, Take 1 tablet by mouth every evening. 50 Plus   Nutritional Supplements (JUICE PLUS FIBRE PO), Take 1 tablet by mouth 2 (two) times daily. Fruit blend and vegetable blend   solifenacin  (VESICARE ) 5 MG tablet, TAKE 1 TABLET(5 MG) BY MOUTH DAILY   zolpidem  (AMBIEN ) 5 MG tablet, Take 1 tablet (5 mg total) by mouth at bedtime as needed. for sleep  Medical History:  Past Medical History:  Diagnosis Date   BPH (benign prostatic hyperplasia)    CAD (coronary artery disease)    a. inf STEMI s/p BMS to mRCA (02/03/14)  b. inf STEMI s/p PCTA to RCA with slow reflow.   Chronic  diastolic CHF (congestive heart failure) (HCC)    a. 2D echo on 05/18/15 LVEF 55-60%, akinesis basal inferior wall, G2DD     GERD (gastroesophageal reflux disease)    Hearing loss    Hemorrhoids    HH (hiatus hernia)    Ischemic cardiomyopathy    a. ECHO 01/2014 EF 45-50% and akinesis of the entire inferior myocardium. Mild MR.   b. now resolved (EF 55-60% on 04/22/15)     Liver abscess 01/05/2024   Metabolic syndrome    Myocardial infarction (HCC) 07/15   Heart attack in 2015 and 2017   OSA (obstructive sleep apnea)    a. mild not requiring CPAP at this time   PAF (paroxysmal atrial fibrillation) (HCC)    a. brief episode after cardiac cath on 05/16/15- resolved after IV amiodarone .   Sleep apnea    Streptococcal bacteremia 01/05/2024   Tinnitus    Varicose veins    Allergies: Allergies[1]   Surgical History:  He  has a past surgical history that includes Colonoscopy (07/21/2004); Coronary angioplasty with stent (02/02/2014); left heart catheterization with coronary angiogram (N/A, 02/02/2014); Endovenous ablation saphenous vein w/ laser (Right, 10/12/2014); Carpal tunnel release (Bilateral, 03/02/2015); stab phlebectomy  (Right, 03/15/2015); Cardiac catheterization (N/A, 05/16/2015); Cardiac  catheterization (N/A, 05/16/2015); Skin biopsy (Left, 06/19/2021); Hiatal hernia repair (N/A, 07/23/2021); Esophagogastroduodenoscopy (N/A, 07/23/2021); Transoral incisionless fundoplication (N/A, 07/23/2021); Esophageal dilation (07/23/2021); and Hernia repair [id number]). Family History:  His family history includes Anxiety disorder in his brother; Breast cancer in his mother; Cancer in his brother, brother, and mother; Cervical cancer in his mother; Depression in his brother; Hearing loss in his father; Heart disease in his brother; Hypertension in his mother; Pancreatic cancer in his brother; Skin cancer in his mother.  REVIEW OF SYSTEMS  : All other systems reviewed and negative except where noted in the History of Present Illness.  PHYSICAL EXAM: BP 128/72   Ht 5' 6 (1.676 m)   Wt 194 lb (88 kg)   BMI 31.31 kg/m  Physical Exam   GENERAL APPEARANCE: Well nourished, in no apparent distress HEENT: No cervical lymphadenopathy, unremarkable thyroid, sclerae anicteric, conjunctiva pink RESPIRATORY: Respiratory effort normal, BS equal bilateral without rales, rhonchi, wheezing CARDIO: RRR with no MRGs, peripheral pulses intact ABDOMEN: Soft, non distended, active bowel sounds in all 4 quadrants, no tenderness to palpation, no rebound, no mass appreciated RECTAL: declines MUSCULOSKELETAL: Full ROM, normal gait, without edema SKIN: Dry, intact without rashes or lesions. No jaundice. NEURO: Alert, oriented, no focal deficits PSYCH: Cooperative, normal mood and affect.      Henry JONELLE Coombs, PA-C 9:36 AM      [1]  Allergies Allergen Reactions   Ace Inhibitors Other (See Comments)    No personal hx reaction, but 2 family members have had persistent cough with the class and he prefers not to take ACEi.   Penicillins Rash   "

## 2024-08-18 ENCOUNTER — Encounter: Payer: Self-pay | Admitting: Physician Assistant

## 2024-08-18 ENCOUNTER — Ambulatory Visit: Admitting: Physician Assistant

## 2024-08-18 VITALS — BP 128/72 | Ht 66.0 in | Wt 194.0 lb

## 2024-08-18 DIAGNOSIS — I5032 Chronic diastolic (congestive) heart failure: Secondary | ICD-10-CM

## 2024-08-18 DIAGNOSIS — I480 Paroxysmal atrial fibrillation: Secondary | ICD-10-CM | POA: Diagnosis not present

## 2024-08-18 DIAGNOSIS — K750 Abscess of liver: Secondary | ICD-10-CM

## 2024-08-18 DIAGNOSIS — K449 Diaphragmatic hernia without obstruction or gangrene: Secondary | ICD-10-CM | POA: Diagnosis not present

## 2024-08-18 DIAGNOSIS — I2510 Atherosclerotic heart disease of native coronary artery without angina pectoris: Secondary | ICD-10-CM

## 2024-08-18 DIAGNOSIS — K2100 Gastro-esophageal reflux disease with esophagitis, without bleeding: Secondary | ICD-10-CM

## 2024-08-18 DIAGNOSIS — Z955 Presence of coronary angioplasty implant and graft: Secondary | ICD-10-CM

## 2024-08-18 DIAGNOSIS — I255 Ischemic cardiomyopathy: Secondary | ICD-10-CM | POA: Diagnosis not present

## 2024-08-18 DIAGNOSIS — Z9889 Other specified postprocedural states: Secondary | ICD-10-CM

## 2024-08-18 DIAGNOSIS — K219 Gastro-esophageal reflux disease without esophagitis: Secondary | ICD-10-CM

## 2024-08-18 DIAGNOSIS — Z860100 Personal history of colon polyps, unspecified: Secondary | ICD-10-CM

## 2024-08-18 DIAGNOSIS — A408 Other streptococcal sepsis: Secondary | ICD-10-CM | POA: Diagnosis not present

## 2024-08-18 DIAGNOSIS — B965 Pseudomonas (aeruginosa) (mallei) (pseudomallei) as the cause of diseases classified elsewhere: Secondary | ICD-10-CM | POA: Diagnosis not present

## 2024-08-18 MED ORDER — FAMOTIDINE 40 MG PO TABS: 40.0000 mg | ORAL_TABLET | Freq: Every day | ORAL | 1 refills | Status: AC

## 2024-08-18 NOTE — Patient Instructions (Addendum)
 VISIT SUMMARY:  During your visit, we discussed your severe rebound heartburn and vomiting after stopping omeprazole , as well as your history of GERD and liver abscess. We have planned several steps to assess and manage your symptoms.  YOUR PLAN:  GASTROESOPHAGEAL REFLUX DISEASE POST-FUNDOPLICATION: You are experiencing severe rebound heartburn after stopping omeprazole , and we are concerned about the integrity of your fundoplication. -We have ordered a barium swallow to check the integrity of your fundoplication and evaluate for reflux. -We will review the endoscopy report from Doctor Rollin. -We will discuss with Doctor Cirigliano about a possible endoscopy with Bravo pH monitoring depending on the barium swallow results. -You have been prescribed famotidine  for symptom control. -We recommend using FDgard to restore your gastric flora after antibiotics. -Bring omeprazole  (20 mg) with you during travel in case your symptoms worsen. -Continue to elevate the head of your bed to help with nocturnal symptoms. -Strictly avoid alcohol to reduce gastric inflammation.  HISTORY OF RESOLVED LIVER ABSCESS: Your previous liver abscess and sepsis have resolved with no current symptoms or recurrence. -Your recent imaging confirms the resolution of the liver abscess.  You have been scheduled for a Barium Esophogram at [facility] (1st floor of the hospital) on Monday, 2-23 at 9:00am. Please arrive 15 minutes prior to your appointment for registration. Make certain not to have anything to eat or drink 3 hours prior to your test. If you need to reschedule for any reason, please contact radiology at [phone] to do so. __________________________________________________________________ A barium swallow is an examination that concentrates on views of the esophagus. This tends to be a double contrast exam (barium and two liquids which, when combined, create a gas to distend the wall of the oesophagus) or  single contrast (non-ionic iodine based). The study is usually tailored to your symptoms so a good history is essential. Attention is paid during the study to the form, structure and configuration of the esophagus, looking for functional disorders (such as aspiration, dysphagia, achalasia, motility and reflux) EXAMINATION You may be asked to change into a gown, depending on the type of swallow being performed. A radiologist and radiographer will perform the procedure. The radiologist will advise you of the type of contrast selected for your procedure and direct you during the exam. You will be asked to stand, sit or lie in several different positions and to hold a small amount of fluid in your mouth before being asked to swallow while the imaging is performed .In some instances you may be asked to swallow barium coated marshmallows to assess the motility of a solid food bolus. The exam can be recorded as a digital or video fluoroscopy procedure. POST PROCEDURE It will take 1-2 days for the barium to pass through your system. To facilitate this, it is important, unless otherwise directed, to increase your fluids for the next 24-48hrs and to resume your normal diet.  This test typically takes about 30 minutes to perform.  We have given you samples of the following medication to take: FDgard - take as directed as needed  We have sent the following medications to your pharmacy for you to pick up at your convenience: Pepcid : Take daily at bedtime  Thank you for entrusting me with your care and for choosing [facility], Alan Coombs, P.A.-C   _______________________________________________________  If your blood pressure at your visit was 140/90 or greater, please contact your primary care physician to follow up on this.  _______________________________________________________  If you are age 20 or  older, your body mass index should be between 23-30. Your Body mass index is 31.31  kg/m. If this is out of the aforementioned range listed, please consider follow up with your Primary Care Provider.  If you are age 15 or younger, your body mass index should be between 19-25. Your Body mass index is 31.31 kg/m. If this is out of the aformentioned range listed, please consider follow up with your Primary Care Provider.   ________________________________________________________  The [facility] providers would like to encourage you to use MYCHART to communicate with providers for non-urgent requests or questions.  Due to long hold times on the telephone, sending your provider a message by [facility] may be a faster and more efficient way to get a response.  Please allow 48 business hours for a response.  Please remember that this is for non-urgent requests.  _______________________________________________________  [facility] is using a team-based approach to care.  Your team is made up of your doctor and two to three APPS. Our APPS (Nurse Practitioners and Physician Assistants) work with your physician to ensure care continuity for you. They are fully qualified to address your health concerns and develop a treatment plan. They communicate directly with your gastroenterologist to care for you. Seeing the Advanced Practice Practitioners on your physician's team can help you by facilitating care more promptly, often allowing for earlier appointments, access to diagnostic testing, procedures, and other specialty referrals.    Due to recent changes in healthcare laws, you may see the results of your imaging and laboratory studies on MyChart before your provider has had a chance to review them.  We understand that in some cases there may be results that are confusing or concerning to you. Not all laboratory results come back in the same time frame and the provider may be waiting for multiple results in order to interpret others.  Please give us  48 hours in order for your provider to  thoroughly review all the results before contacting the office for clarification of your results.    __________________________________________________________________________________

## 2024-08-24 NOTE — Progress Notes (Signed)
 Agree with the assessment and plan as outlined by Alan Coombs, PA-C.  Did he have any significant nausea/vomiting when he was septic with liver abscess?  If so, may have had some disruption of wrap integrity, although this was not seen during EGD with Dr. Rollin in 12/2023 per report.  Agree that part of the issue could be rebound hyperacid secretion, although the risk of this should be mitigated since he titrated omeprazole  down rather than stopping abruptly from high-dose therapy.  Based on clinical description, I recommend the following: - Barium esophagram to evaluate wrap integrity along with presence of refluxate - Depending on esophagram findings, may need to consider repeat EGD with Bravo study - Ok to add Pepcid  for diagnostic and therapeutic intent  [employer], DO, [facility]

## 2024-09-01 ENCOUNTER — Ambulatory Visit (HOSPITAL_COMMUNITY)

## 2024-09-12 ENCOUNTER — Ambulatory Visit (HOSPITAL_COMMUNITY)

## 2024-09-13 ENCOUNTER — Ambulatory Visit: Payer: Medicare PPO | Admitting: Family Medicine

## 2024-09-15 ENCOUNTER — Ambulatory Visit: Admitting: Cardiology

## 2024-09-21 ENCOUNTER — Ambulatory Visit: Payer: Self-pay | Admitting: Infectious Disease

## 2024-10-03 ENCOUNTER — Encounter: Payer: Self-pay | Admitting: Family Medicine

## 2024-10-18 ENCOUNTER — Ambulatory Visit: Payer: Self-pay
